# Patient Record
Sex: Female | Born: 1955 | Race: Black or African American | Hispanic: No | Marital: Married | Smoking: Former smoker
Health system: Southern US, Community
[De-identification: ages and names within clinical notes are randomized; demographics above are authoritative.]

## PROBLEM LIST (undated history)

## (undated) DIAGNOSIS — R42 Dizziness and giddiness: Secondary | ICD-10-CM

## (undated) DIAGNOSIS — Z9889 Other specified postprocedural states: Secondary | ICD-10-CM

## (undated) DIAGNOSIS — IMO0001 Reserved for inherently not codable concepts without codable children: Secondary | ICD-10-CM

## (undated) DIAGNOSIS — K5792 Diverticulitis of intestine, part unspecified, without perforation or abscess without bleeding: Secondary | ICD-10-CM

## (undated) DIAGNOSIS — K219 Gastro-esophageal reflux disease without esophagitis: Secondary | ICD-10-CM

## (undated) DIAGNOSIS — R51 Headache: Secondary | ICD-10-CM

## (undated) DIAGNOSIS — R112 Nausea with vomiting, unspecified: Secondary | ICD-10-CM

## (undated) DIAGNOSIS — E1140 Type 2 diabetes mellitus with diabetic neuropathy, unspecified: Secondary | ICD-10-CM

## (undated) DIAGNOSIS — F32A Depression, unspecified: Secondary | ICD-10-CM

## (undated) DIAGNOSIS — F319 Bipolar disorder, unspecified: Secondary | ICD-10-CM

## (undated) DIAGNOSIS — Z8709 Personal history of other diseases of the respiratory system: Secondary | ICD-10-CM

## (undated) DIAGNOSIS — R519 Headache, unspecified: Secondary | ICD-10-CM

## (undated) DIAGNOSIS — T4145XA Adverse effect of unspecified anesthetic, initial encounter: Secondary | ICD-10-CM

## (undated) DIAGNOSIS — F419 Anxiety disorder, unspecified: Secondary | ICD-10-CM

## (undated) DIAGNOSIS — T8859XA Other complications of anesthesia, initial encounter: Secondary | ICD-10-CM

## (undated) DIAGNOSIS — E876 Hypokalemia: Secondary | ICD-10-CM

## (undated) DIAGNOSIS — I10 Essential (primary) hypertension: Secondary | ICD-10-CM

## (undated) DIAGNOSIS — R079 Chest pain, unspecified: Secondary | ICD-10-CM

## (undated) DIAGNOSIS — F329 Major depressive disorder, single episode, unspecified: Secondary | ICD-10-CM

## (undated) DIAGNOSIS — E7800 Pure hypercholesterolemia, unspecified: Secondary | ICD-10-CM

## (undated) DIAGNOSIS — G4730 Sleep apnea, unspecified: Secondary | ICD-10-CM

## (undated) HISTORY — PX: ABDOMINAL HYSTERECTOMY: SHX81

## (undated) HISTORY — DX: Dizziness and giddiness: R42

## (undated) HISTORY — PX: APPENDECTOMY: SHX54

## (undated) HISTORY — PX: TONSILLECTOMY: SUR1361

## (undated) HISTORY — PX: WISDOM TOOTH EXTRACTION: SHX21

## (undated) HISTORY — PX: CHOLECYSTECTOMY: SHX55

## (undated) HISTORY — DX: Chest pain, unspecified: R07.9

---

## 1988-08-07 HISTORY — PX: APPENDECTOMY: SHX54

## 1992-08-07 HISTORY — PX: CHOLECYSTECTOMY: SHX55

## 1994-08-07 HISTORY — PX: ABDOMINAL HYSTERECTOMY: SHX81

## 2001-04-04 ENCOUNTER — Ambulatory Visit (HOSPITAL_COMMUNITY): Admission: RE | Admit: 2001-04-04 | Discharge: 2001-04-04 | Payer: Self-pay | Admitting: Orthopedic Surgery

## 2001-04-04 ENCOUNTER — Encounter: Payer: Self-pay | Admitting: Orthopedic Surgery

## 2002-01-19 ENCOUNTER — Encounter: Payer: Self-pay | Admitting: Internal Medicine

## 2002-01-19 ENCOUNTER — Emergency Department (HOSPITAL_COMMUNITY): Admission: EM | Admit: 2002-01-19 | Discharge: 2002-01-19 | Payer: Self-pay | Admitting: Internal Medicine

## 2002-01-24 ENCOUNTER — Ambulatory Visit (HOSPITAL_COMMUNITY): Admission: RE | Admit: 2002-01-24 | Discharge: 2002-01-24 | Payer: Self-pay | Admitting: Family Medicine

## 2002-01-24 ENCOUNTER — Encounter: Payer: Self-pay | Admitting: Family Medicine

## 2002-04-05 ENCOUNTER — Emergency Department (HOSPITAL_COMMUNITY): Admission: EM | Admit: 2002-04-05 | Discharge: 2002-04-05 | Payer: Self-pay | Admitting: Emergency Medicine

## 2002-06-03 ENCOUNTER — Encounter: Admission: RE | Admit: 2002-06-03 | Discharge: 2002-06-03 | Payer: Self-pay | Admitting: Otolaryngology

## 2002-06-03 ENCOUNTER — Encounter: Payer: Self-pay | Admitting: Otolaryngology

## 2002-08-07 HISTORY — PX: CARDIAC CATHETERIZATION: SHX172

## 2002-12-05 ENCOUNTER — Ambulatory Visit (HOSPITAL_COMMUNITY): Admission: RE | Admit: 2002-12-05 | Discharge: 2002-12-05 | Payer: Self-pay | Admitting: Internal Medicine

## 2003-01-16 ENCOUNTER — Ambulatory Visit (HOSPITAL_COMMUNITY): Admission: RE | Admit: 2003-01-16 | Discharge: 2003-01-17 | Payer: Self-pay | Admitting: Cardiovascular Disease

## 2003-04-23 ENCOUNTER — Ambulatory Visit (HOSPITAL_COMMUNITY): Admission: RE | Admit: 2003-04-23 | Discharge: 2003-04-23 | Payer: Self-pay | Admitting: Family Medicine

## 2003-04-23 ENCOUNTER — Encounter: Payer: Self-pay | Admitting: Family Medicine

## 2004-04-23 ENCOUNTER — Emergency Department (HOSPITAL_COMMUNITY): Admission: EM | Admit: 2004-04-23 | Discharge: 2004-04-23 | Payer: Self-pay | Admitting: Emergency Medicine

## 2004-08-07 ENCOUNTER — Emergency Department (HOSPITAL_COMMUNITY): Admission: EM | Admit: 2004-08-07 | Discharge: 2004-08-07 | Payer: Self-pay | Admitting: Emergency Medicine

## 2004-10-10 ENCOUNTER — Emergency Department (HOSPITAL_COMMUNITY): Admission: EM | Admit: 2004-10-10 | Discharge: 2004-10-10 | Payer: Self-pay | Admitting: Emergency Medicine

## 2004-10-25 ENCOUNTER — Ambulatory Visit (HOSPITAL_COMMUNITY): Admission: RE | Admit: 2004-10-25 | Discharge: 2004-10-25 | Payer: Self-pay | Admitting: Internal Medicine

## 2004-12-29 ENCOUNTER — Emergency Department (HOSPITAL_COMMUNITY): Admission: EM | Admit: 2004-12-29 | Discharge: 2004-12-29 | Payer: Self-pay | Admitting: Emergency Medicine

## 2005-04-21 ENCOUNTER — Emergency Department (HOSPITAL_COMMUNITY): Admission: EM | Admit: 2005-04-21 | Discharge: 2005-04-21 | Payer: Self-pay | Admitting: Emergency Medicine

## 2005-07-20 ENCOUNTER — Emergency Department (HOSPITAL_COMMUNITY): Admission: EM | Admit: 2005-07-20 | Discharge: 2005-07-20 | Payer: Self-pay | Admitting: *Deleted

## 2006-05-03 ENCOUNTER — Ambulatory Visit (HOSPITAL_BASED_OUTPATIENT_CLINIC_OR_DEPARTMENT_OTHER): Admission: RE | Admit: 2006-05-03 | Discharge: 2006-05-03 | Payer: Self-pay | Admitting: Otolaryngology

## 2006-05-13 ENCOUNTER — Ambulatory Visit: Payer: Self-pay | Admitting: Internal Medicine

## 2006-06-25 ENCOUNTER — Emergency Department (HOSPITAL_COMMUNITY): Admission: EM | Admit: 2006-06-25 | Discharge: 2006-06-25 | Payer: Self-pay | Admitting: Emergency Medicine

## 2006-11-16 ENCOUNTER — Emergency Department (HOSPITAL_COMMUNITY): Admission: EM | Admit: 2006-11-16 | Discharge: 2006-11-16 | Payer: Self-pay | Admitting: Emergency Medicine

## 2007-03-24 ENCOUNTER — Emergency Department (HOSPITAL_COMMUNITY): Admission: EM | Admit: 2007-03-24 | Discharge: 2007-03-24 | Payer: Self-pay | Admitting: Emergency Medicine

## 2007-04-24 ENCOUNTER — Emergency Department (HOSPITAL_COMMUNITY): Admission: EM | Admit: 2007-04-24 | Discharge: 2007-04-24 | Payer: Self-pay | Admitting: Emergency Medicine

## 2007-05-13 ENCOUNTER — Emergency Department (HOSPITAL_COMMUNITY): Admission: EM | Admit: 2007-05-13 | Discharge: 2007-05-14 | Payer: Self-pay | Admitting: Emergency Medicine

## 2007-08-08 DIAGNOSIS — R079 Chest pain, unspecified: Secondary | ICD-10-CM

## 2007-08-08 HISTORY — DX: Chest pain, unspecified: R07.9

## 2007-08-08 HISTORY — PX: CARDIAC CATHETERIZATION: SHX172

## 2007-11-04 ENCOUNTER — Emergency Department (HOSPITAL_COMMUNITY): Admission: EM | Admit: 2007-11-04 | Discharge: 2007-11-04 | Payer: Self-pay | Admitting: Emergency Medicine

## 2007-11-04 ENCOUNTER — Inpatient Hospital Stay (HOSPITAL_COMMUNITY): Admission: RE | Admit: 2007-11-04 | Discharge: 2007-11-08 | Payer: Self-pay | Admitting: *Deleted

## 2007-11-04 ENCOUNTER — Ambulatory Visit: Payer: Self-pay | Admitting: *Deleted

## 2008-03-22 ENCOUNTER — Inpatient Hospital Stay (HOSPITAL_COMMUNITY): Admission: EM | Admit: 2008-03-22 | Discharge: 2008-03-25 | Payer: Self-pay | Admitting: Emergency Medicine

## 2008-03-23 ENCOUNTER — Encounter (INDEPENDENT_AMBULATORY_CARE_PROVIDER_SITE_OTHER): Payer: Self-pay | Admitting: Internal Medicine

## 2008-04-22 ENCOUNTER — Ambulatory Visit: Payer: Self-pay | Admitting: Internal Medicine

## 2008-07-10 ENCOUNTER — Emergency Department (HOSPITAL_COMMUNITY): Admission: EM | Admit: 2008-07-10 | Discharge: 2008-07-10 | Payer: Self-pay | Admitting: Emergency Medicine

## 2009-03-13 ENCOUNTER — Emergency Department (HOSPITAL_COMMUNITY): Admission: EM | Admit: 2009-03-13 | Discharge: 2009-03-13 | Payer: Self-pay | Admitting: Emergency Medicine

## 2009-06-03 ENCOUNTER — Emergency Department (HOSPITAL_COMMUNITY): Admission: EM | Admit: 2009-06-03 | Discharge: 2009-06-03 | Payer: Self-pay | Admitting: Emergency Medicine

## 2009-09-16 ENCOUNTER — Ambulatory Visit: Payer: Self-pay | Admitting: Cardiology

## 2009-09-16 ENCOUNTER — Inpatient Hospital Stay (HOSPITAL_COMMUNITY): Admission: EM | Admit: 2009-09-16 | Discharge: 2009-09-17 | Payer: Self-pay | Admitting: Emergency Medicine

## 2009-09-17 ENCOUNTER — Encounter (INDEPENDENT_AMBULATORY_CARE_PROVIDER_SITE_OTHER): Payer: Self-pay | Admitting: Internal Medicine

## 2010-05-13 ENCOUNTER — Emergency Department (HOSPITAL_COMMUNITY): Admission: EM | Admit: 2010-05-13 | Discharge: 2010-05-13 | Payer: Self-pay | Admitting: Emergency Medicine

## 2010-05-23 ENCOUNTER — Ambulatory Visit: Payer: Self-pay | Admitting: Diagnostic Radiology

## 2010-05-23 ENCOUNTER — Ambulatory Visit (HOSPITAL_BASED_OUTPATIENT_CLINIC_OR_DEPARTMENT_OTHER)
Admission: RE | Admit: 2010-05-23 | Discharge: 2010-05-23 | Payer: Self-pay | Source: Home / Self Care | Admitting: Internal Medicine

## 2010-05-30 ENCOUNTER — Ambulatory Visit: Payer: Self-pay | Admitting: Radiology

## 2010-05-30 ENCOUNTER — Ambulatory Visit (HOSPITAL_BASED_OUTPATIENT_CLINIC_OR_DEPARTMENT_OTHER): Admission: RE | Admit: 2010-05-30 | Discharge: 2010-05-30 | Payer: Self-pay | Admitting: Internal Medicine

## 2010-07-27 ENCOUNTER — Emergency Department (HOSPITAL_COMMUNITY)
Admission: EM | Admit: 2010-07-27 | Discharge: 2010-07-27 | Payer: Self-pay | Source: Home / Self Care | Admitting: Emergency Medicine

## 2010-08-28 ENCOUNTER — Encounter: Payer: Self-pay | Admitting: Family Medicine

## 2010-09-06 NOTE — Consult Note (Signed)
Summary: [facility]   Imported By: Roderic Ovens 10/04/2009 16:07:41  _____________________________________________________________________  External Attachment:    Type:   Image     Comment:   External Document

## 2010-10-17 LAB — DIFFERENTIAL
Basophils Absolute: 0 10*3/uL (ref 0.0–0.1)
Basophils Relative: 1 % (ref 0–1)
Eosinophils Absolute: 0.2 10*3/uL (ref 0.0–0.7)
Eosinophils Relative: 4 % (ref 0–5)
Lymphocytes Relative: 40 % (ref 12–46)
Lymphs Abs: 1.9 10*3/uL (ref 0.7–4.0)
Monocytes Absolute: 0.3 10*3/uL (ref 0.1–1.0)
Monocytes Relative: 7 % (ref 3–12)
Neutro Abs: 2.3 10*3/uL (ref 1.7–7.7)
Neutrophils Relative %: 48 % (ref 43–77)

## 2010-10-17 LAB — COMPREHENSIVE METABOLIC PANEL
ALT: 24 U/L (ref 0–35)
AST: 25 U/L (ref 0–37)
Albumin: 3.7 g/dL (ref 3.5–5.2)
Alkaline Phosphatase: 86 U/L (ref 39–117)
BUN: 7 mg/dL (ref 6–23)
CO2: 27 mEq/L (ref 19–32)
Calcium: 9.1 mg/dL (ref 8.4–10.5)
Chloride: 105 mEq/L (ref 96–112)
Creatinine, Ser: 0.8 mg/dL (ref 0.4–1.2)
GFR calc Af Amer: >60 mL/min (ref >60)
GFR calc non Af Amer: >60 mL/min (ref >60)
Glucose, Bld: 110 mg/dL — ABNORMAL HIGH (ref 70–99)
Potassium: 3.9 mEq/L (ref 3.5–5.1)
Sodium: 142 mEq/L (ref 135–145)
Total Bilirubin: 0.5 mg/dL (ref 0.3–1.2)
Total Protein: 7.2 g/dL (ref 6.0–8.3)

## 2010-10-17 LAB — CBC
HCT: 41.7 % (ref 36.0–46.0)
Hemoglobin: 14 g/dL (ref 12.0–15.0)
MCH: 28.7 pg (ref 26.0–34.0)
MCHC: 33.6 g/dL (ref 30.0–36.0)
MCV: 85.5 fL (ref 78.0–100.0)
Platelets: 198 10*3/uL (ref 150–400)
RBC: 4.88 MIL/uL (ref 3.87–5.11)
RDW: 14.1 % (ref 11.5–15.5)
WBC: 4.8 10*3/uL (ref 4.0–10.5)

## 2010-10-17 LAB — POCT CARDIAC MARKERS
CKMB, poc: <1 ng/mL — ABNORMAL LOW (ref 1.0–8.0)
Myoglobin, poc: 38.9 ng/mL (ref 12–200)
Troponin i, poc: <0.05 ng/mL (ref 0.00–0.09)

## 2010-10-17 LAB — D-DIMER, QUANTITATIVE: D-Dimer, Quant: 0.45 ug/mL-FEU (ref 0.00–0.48)

## 2010-10-26 LAB — CBC
HCT: 37.9 % (ref 36.0–46.0)
HCT: 38.7 % (ref 36.0–46.0)
Hemoglobin: 13.1 g/dL (ref 12.0–15.0)
Hemoglobin: 13.4 g/dL (ref 12.0–15.0)
MCHC: 34.7 g/dL (ref 30.0–36.0)
MCHC: 34.8 g/dL (ref 30.0–36.0)
MCV: 85.6 fL (ref 78.0–100.0)
MCV: 86.2 fL (ref 78.0–100.0)
Platelets: 160 10*3/uL (ref 150–400)
Platelets: 191 10*3/uL (ref 150–400)
RBC: 4.4 MIL/uL (ref 3.87–5.11)
RBC: 4.52 MIL/uL (ref 3.87–5.11)
RDW: 13.2 % (ref 11.5–15.5)
RDW: 13.6 % (ref 11.5–15.5)
WBC: 5.4 10*3/uL (ref 4.0–10.5)
WBC: 6 10*3/uL (ref 4.0–10.5)

## 2010-10-26 LAB — DIFFERENTIAL
Basophils Absolute: 0 10*3/uL (ref 0.0–0.1)
Basophils Relative: 1 % (ref 0–1)
Eosinophils Absolute: 0.2 10*3/uL (ref 0.0–0.7)
Eosinophils Relative: 3 % (ref 0–5)
Lymphocytes Relative: 38 % (ref 12–46)
Lymphs Abs: 2.2 10*3/uL (ref 0.7–4.0)
Monocytes Absolute: 0.4 10*3/uL (ref 0.1–1.0)
Monocytes Relative: 7 % (ref 3–12)
Neutro Abs: 3.1 10*3/uL (ref 1.7–7.7)
Neutrophils Relative %: 52 % (ref 43–77)

## 2010-10-26 LAB — CARDIAC PANEL(CRET KIN+CKTOT+MB+TROPI)
CK, MB: 0.8 ng/mL (ref 0.3–4.0)
CK, MB: 0.9 ng/mL (ref 0.3–4.0)
CK, MB: 1 ng/mL (ref 0.3–4.0)
Relative Index: INVALID (ref 0.0–2.5)
Relative Index: INVALID (ref 0.0–2.5)
Relative Index: INVALID (ref 0.0–2.5)
Total CK: 81 U/L (ref 7–177)
Total CK: 83 U/L (ref 7–177)
Total CK: 88 U/L (ref 7–177)
Troponin I: 0.04 ng/mL (ref 0.00–0.06)
Troponin I: <0.01 ng/mL (ref 0.00–0.06)
Troponin I: <0.01 ng/mL (ref 0.00–0.06)

## 2010-10-26 LAB — COMPREHENSIVE METABOLIC PANEL
ALT: 34 U/L (ref 0–35)
ALT: 36 U/L — ABNORMAL HIGH (ref 0–35)
AST: 31 U/L (ref 0–37)
AST: 32 U/L (ref 0–37)
Albumin: 3.4 g/dL — ABNORMAL LOW (ref 3.5–5.2)
Albumin: 3.6 g/dL (ref 3.5–5.2)
Alkaline Phosphatase: 93 U/L (ref 39–117)
Alkaline Phosphatase: 96 U/L (ref 39–117)
BUN: 10 mg/dL (ref 6–23)
BUN: 11 mg/dL (ref 6–23)
CO2: 28 mEq/L (ref 19–32)
CO2: 31 mEq/L (ref 19–32)
Calcium: 8.5 mg/dL (ref 8.4–10.5)
Calcium: 9.2 mg/dL (ref 8.4–10.5)
Chloride: 100 mEq/L (ref 96–112)
Chloride: 104 mEq/L (ref 96–112)
Creatinine, Ser: 0.61 mg/dL (ref 0.4–1.2)
Creatinine, Ser: 0.74 mg/dL (ref 0.4–1.2)
GFR calc Af Amer: >60 mL/min (ref >60)
GFR calc Af Amer: >60 mL/min (ref >60)
GFR calc non Af Amer: >60 mL/min (ref >60)
GFR calc non Af Amer: >60 mL/min (ref >60)
Glucose, Bld: 102 mg/dL — ABNORMAL HIGH (ref 70–99)
Glucose, Bld: 166 mg/dL — ABNORMAL HIGH (ref 70–99)
Potassium: 3.3 mEq/L — ABNORMAL LOW (ref 3.5–5.1)
Potassium: 3.6 mEq/L (ref 3.5–5.1)
Sodium: 138 mEq/L (ref 135–145)
Sodium: 139 mEq/L (ref 135–145)
Total Bilirubin: 0.2 mg/dL — ABNORMAL LOW (ref 0.3–1.2)
Total Bilirubin: 0.5 mg/dL (ref 0.3–1.2)
Total Protein: 6.8 g/dL (ref 6.0–8.3)
Total Protein: 6.8 g/dL (ref 6.0–8.3)

## 2010-10-26 LAB — GLUCOSE, CAPILLARY
Glucose-Capillary: 109 mg/dL — ABNORMAL HIGH (ref 70–99)
Glucose-Capillary: 110 mg/dL — ABNORMAL HIGH (ref 70–99)
Glucose-Capillary: 114 mg/dL — ABNORMAL HIGH (ref 70–99)
Glucose-Capillary: 117 mg/dL — ABNORMAL HIGH (ref 70–99)
Glucose-Capillary: 117 mg/dL — ABNORMAL HIGH (ref 70–99)
Glucose-Capillary: 129 mg/dL — ABNORMAL HIGH (ref 70–99)

## 2010-10-26 LAB — URINALYSIS, ROUTINE W REFLEX MICROSCOPIC
Bilirubin Urine: NEGATIVE
Glucose, UA: NEGATIVE mg/dL
Hgb urine dipstick: NEGATIVE
Ketones, ur: NEGATIVE mg/dL
Leukocytes, UA: NEGATIVE
Nitrite: POSITIVE — AB
Protein, ur: NEGATIVE mg/dL
Specific Gravity, Urine: 1.028 (ref 1.005–1.030)
Urobilinogen, UA: 1 mg/dL (ref 0.0–1.0)
pH: 6 (ref 5.0–8.0)

## 2010-10-26 LAB — URINE CULTURE: Colony Count: 75000

## 2010-10-26 LAB — HEMOGLOBIN A1C
Hgb A1c MFr Bld: 6.5 % — ABNORMAL HIGH (ref 4.6–6.1)
Mean Plasma Glucose: 140 mg/dL

## 2010-10-26 LAB — POCT CARDIAC MARKERS
CKMB, poc: <1 ng/mL — ABNORMAL LOW (ref 1.0–8.0)
CKMB, poc: <1 ng/mL — ABNORMAL LOW (ref 1.0–8.0)
Myoglobin, poc: 35.4 ng/mL (ref 12–200)
Myoglobin, poc: 40.6 ng/mL (ref 12–200)
Troponin i, poc: 0.09 ng/mL (ref 0.00–0.09)
Troponin i, poc: <0.05 ng/mL (ref 0.00–0.09)

## 2010-10-26 LAB — RAPID URINE DRUG SCREEN, HOSP PERFORMED
Amphetamines: NOT DETECTED
Barbiturates: NOT DETECTED
Benzodiazepines: NOT DETECTED
Cocaine: NOT DETECTED
Opiates: POSITIVE — AB
Tetrahydrocannabinol: NOT DETECTED

## 2010-10-26 LAB — MRSA PCR SCREENING: MRSA by PCR: NEGATIVE

## 2010-10-26 LAB — TSH: TSH: 2.38 u[IU]/mL (ref 0.350–4.500)

## 2010-10-26 LAB — URINE MICROSCOPIC-ADD ON

## 2010-11-10 LAB — CBC
HCT: 38.4 % (ref 36.0–46.0)
Hemoglobin: 13.1 g/dL (ref 12.0–15.0)
MCHC: 34 g/dL (ref 30.0–36.0)
MCV: 86.3 fL (ref 78.0–100.0)
Platelets: 173 10*3/uL (ref 150–400)
RBC: 4.45 MIL/uL (ref 3.87–5.11)
RDW: 13.9 % (ref 11.5–15.5)
WBC: 5.8 10*3/uL (ref 4.0–10.5)

## 2010-11-10 LAB — DIFFERENTIAL
Basophils Absolute: 0 10*3/uL (ref 0.0–0.1)
Basophils Relative: 1 % (ref 0–1)
Eosinophils Absolute: 0.1 10*3/uL (ref 0.0–0.7)
Eosinophils Relative: 1 % (ref 0–5)
Lymphocytes Relative: 35 % (ref 12–46)
Lymphs Abs: 2 10*3/uL (ref 0.7–4.0)
Monocytes Absolute: 0.6 10*3/uL (ref 0.1–1.0)
Monocytes Relative: 11 % (ref 3–12)
Neutro Abs: 3 10*3/uL (ref 1.7–7.7)
Neutrophils Relative %: 52 % (ref 43–77)

## 2010-11-10 LAB — BASIC METABOLIC PANEL
BUN: 10 mg/dL (ref 6–23)
CO2: 31 mEq/L (ref 19–32)
Calcium: 9.6 mg/dL (ref 8.4–10.5)
Chloride: 102 mEq/L (ref 96–112)
Creatinine, Ser: 0.65 mg/dL (ref 0.4–1.2)
GFR calc Af Amer: >60 mL/min (ref >60)
GFR calc non Af Amer: >60 mL/min (ref >60)
Glucose, Bld: 101 mg/dL — ABNORMAL HIGH (ref 70–99)
Potassium: 3.4 mEq/L — ABNORMAL LOW (ref 3.5–5.1)
Sodium: 139 mEq/L (ref 135–145)

## 2010-11-10 LAB — D-DIMER, QUANTITATIVE

## 2010-12-07 ENCOUNTER — Other Ambulatory Visit: Payer: Self-pay | Admitting: Sports Medicine

## 2010-12-07 DIAGNOSIS — M79641 Pain in right hand: Secondary | ICD-10-CM

## 2010-12-20 NOTE — Discharge Summary (Signed)
NAMEGUDRUN, Bonnie Gordon                 ACCOUNT NO.:  [id number]   MEDICAL RECORD NO.:  [id number]          PATIENT TYPE:  INP   LOCATION:  1443                         FACILITY:  [facility]   PHYSICIAN:  Hind I Elsaid, MD      DATE OF BIRTH:  01-21-1956   DATE OF ADMISSION:  03/22/2008  DATE OF DISCHARGE:  03/25/2008                               DISCHARGE SUMMARY   The patient will follow up with [facility].   DISCHARGE DIAGNOSES:  1. Atypical chest pain.  2. Non-critical coronary artery disease.  3. Presyncope felt secondary to orthostasis from antihypertensive      medication, resolved.  4. Hyperlipidemia.  5. Hypertension.  6. History of depression.   SECONDARY DIAGNOSES:  1. History of gastroesophageal reflux disease.  2. History of irritable bowel syndrome.  3. History of mild obstructive sleep apnea.   DISCHARGE MEDICATIONS:  1. Abilify 5 mg daily.  2. Trazodone 100 mg daily.  3. Metoprolol 25 mg twice daily.  4. prevacid 30 mg daily.  5.crestor 20 mg po QHS.   PROCEDURES:  1. Cardiac catheterization which showed left main normal LAD, 30%      proximal stenosis.  Circumflex non-dominant, normal.  Ramus      moderate in size and normal.  Right coronary artery with 30%      stenosis.  Ejection fraction 60% without focal wall motion      abnormalities.  2. CT angio negative for pulmonary emboli.  3. Chest x-ray with no evidence of acute cardiopulmonary disease.   HISTORY OF PRESENT ILLNESS:  This is a 55 year old female admitted to  the hospital with chest pain on and off, usually while standing, but  there was no loss of consciousness, and she measured her blood pressure,  and it was found to be 84 at home.  Her daughter drove her to the  emergency room.   PROBLEM:  1. For chest pain, cardiac enzymes and EKG within normal limits.  MI      was ruled out.  Cardiology consulted and they recommended cardiac      catheterization.  Cardiac catheterization results as above.   Statin      was added to the patient's medications.  Hemoglobin c1 was normal.      TSH was within normal limits.  The patient was diagnosed with non-      obstructive coronary artery disease for medical management to      control the blood pressure and hyperlipidemia.  Coronary artery      disease was ruled out, and the patient was advised to follow with      [facility] for a possible GI cause of her chest pain.  The patient      was prescribed Ranitidine for 2 months, and if continues to have      chest pain, may need to have an EGD.  2. Hypertension.  For that, hydrochlorothiazide was held, and the      patient was stated on a small dose beta blocker, and the patient      informed about if  has any fainting , she has to hold her blood      pressure medication.  3. Hyperlipidemia.  Lovastatin was started.   No complication after the cardiac catheterization, and the patient is  medically stable to be discharged home.  Follow with [facility] within  1-2 weeks.      Hind Bosie Helper, MD  Electronically Signed     HIE/MEDQ  D:  03/25/2008  T:  03/25/2008  Job:  [id number]

## 2010-12-20 NOTE — H&P (Signed)
Bonnie Gordon, Bonnie Gordon                 ACCOUNT NO.:  [id number]   MEDICAL RECORD NO.:  [id number]          PATIENT TYPE:  IPS   LOCATION:  0303                          [facility]   PHYSICIAN:  Jasmine Pang, M.D. DATE OF BIRTH:  07-26-1956   DATE OF ADMISSION:  11/04/2007  DATE OF DISCHARGE:                       PSYCHIATRIC ADMISSION ASSESSMENT   This is a 55 year old female voluntarily admitted on November 04, 2007.   HISTORY OF PRESENT ILLNESS:  The patient presents with a history of  depression.  She states that she has been experiencing this on and off  for a while.  Having episodes of crying.  She feels like she is always  on the back burner.  She is having thoughts to run off a bridge or  wreck her car.  She reports also that she is having periods of drinking  too much.  She has had a decrease in appetite and she has lost 20  pounds.  She was not trying to lose weight.  This happened over a 6  month period.  She states that her children are her biggest stress and  that they do not take her symptoms seriously   PAST PSYCHIATRIC HISTORY:  This is the first admission to the [facility].  No current outpatient treatment.   SOCIAL HISTORY:  This is a 55 year old female.  She lives alone.  She  has been divorced since 2005.  She states her ex-husband is supportive.  She has two adult daughters.  She has a friend who is also supportive.  She is attempting to get disability.  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   FAMILY HISTORY:  None that she is aware of.  __________   She again reports excessive drinking at times.  Denies any drug use.  Is  a nonsmoker.   PRIMARY CARE Angel Hobdy:  Dr. Yetta Barre that [facility].   MEDICAL PROBLEMS:  1. Hypertension.  2. Migraine headaches.  3. Chronic bronchitis.   MEDICATIONS:  1. Has been on Prozac in the past.  Took herself off in August, as she      was stating that people were telling her that she  would become      dependent on the medicine.  2. Also takes Flexeril t.i.d. p.r.n.   DRUG ALLERGIES:  ASPIRIN.  Reports problems with bruising.   PHYSICAL EXAMINATION:  The patient was fully assessed at [facility]  emergency department which was reviewed.  There were no significant  findings.  The patient did receive Ativan.  VITAL SIGNS:  Temperature is 97, 73 heart rate, 24 respirations, blood  pressure is 137/92, 5 feet 6 inches tall, 156 pounds.   LABORATORY DATA:  CBC within normal limits.  Potassium 3.6.  Urinalysis  is negative.  Urine drug screen is negative.   The patient denies any complaints.  Again, she appears in no distress.  Mental status - at this time, she is fully alert, cooperative,  appropriately dressed, fair eye contact.  Speech is soft-spoken, normal  pace.  The patient's mood is  hopeless.  The patient's affect is sad.  Thought processes are coherent.  No evidence of any delusional thinking.  Cognitive function intact.  Her memory is good.  Judgment and insight  appears to be good.  She appears sincere.   IMPRESSION:  AXIS I:  Depressive disorder NOS.  AXIS II:  Deferred.  AXIS III:  Hypertension, migraines and chronic bronchitis.  AXIS IV:  Problems with her children and other psychosocial problems.  AXIS V:  Current is 35-40.   Plans to contract for safety.  Will stabilize her mood and thinking.  Will monitor patient's blood pressure.  Resume her blood pressure  medications.  We will put the patient back on Prozac.  The patient was  agreeable to beginning her medication again.  Will also consider having  a family session with her children.  The patient may benefit from a  therapist.  We also need to address the patient's alcohol use.  Her  tentative length of stay is 3-5 days.      Landry Corporal, N.P.      Jasmine Pang, M.D.  Electronically Signed    JO/MEDQ  D:  11/06/2007  T:  11/06/2007  Job:  [id number]

## 2010-12-20 NOTE — H&P (Signed)
Bonnie Gordon, Bonnie Gordon                 ACCOUNT NO.:  [id number]   MEDICAL RECORD NO.:  [id number]          PATIENT TYPE:  INP   LOCATION:  0105                         FACILITY:  [facility]   PHYSICIAN:  Isidor Holts, M.D.  DATE OF BIRTH:  Jan 29, 1956   DATE OF ADMISSION:  03/22/2008  DATE OF DISCHARGE:                              HISTORY & PHYSICAL   PRIMARY CARE PHYSICIAN:  Unassigned. The patient used to see Dr. Assunta Found in [place], [place].  However, she subsequently lost  her insurance and is currently being cared for by Dr. Yetta Barre at [facility], from time to time.   CHIEF COMPLAINT:  Recurrent chest pain for approximately 1 week,  faintness for approximately 3 days.   HISTORY OF PRESENT ILLNESS:  This is a 55 year old female.  She is a  very good historian.  According to her, over the past 1 week, she has  experienced chest pain on and off at rest, and on exertion.  She  describes this chest pain as central, radiating to her right shoulder  and breast, lasting approximately 5 minutes, associated with shortness  of breath, diaphoresis and nausea.  Usually she feels as though she has  been kicked in the chest.  In addition, for the past 3 days the  patient has experienced faintness, usually while standing; however, no  loss of consciousness.  On March 22, 2008, at about 11 a.m., while  cooking in her kitchen, she felt faint, took her blood pressure and  found her SBP to about 84 mmhg.  She called her daughter, and drove  subsequently to the emergency department.  On detailed questioning. the  patient states that she had traveled en to [place] on July 12, a 5-hour  trip each way.  She denies any leg swelling since then, but has  experienced nocturnal leg cramps.   PAST MEDICAL HISTORY:  1. GERD.  2. Query irritable bowel syndrome.  3. Mild obstructive sleep apnea syndrome per polysomnogram May 03, 2006, not requiring CPAP.  4.  Diverticulosis/external hemorrhoids on colonoscopy April 2004.  5. Hypertension.  6. Status post cardiac catheterization January 16, 2003 by Dr. Nicki Guadalajara, for chest pain.  This showed normal LV function, ejection      fraction 58%, 10% to 20% proximal LAD.  7. Depression, status post hospitalization at [facility]      from November 04, 2007 to November 08, 2007  for suicidal ideation.  8. Status post cholecystectomy in 2000.  9. Status post appendectomy.  10.Status post hysterectomy for uterine fibroids 1994.  11.Migraine headaches.   MEDICATIONS:  1. Abilify 5 mg p.o. daily.  2. Hydrochlorothiazide 25 mg p.o. daily.  3. Trazodone 100 mg p.o. at bedtime.   ALLERGIES:  ASPIRIN causes bruising.   REVIEW OF SYSTEMS:  As per HPI and chief complaint; otherwise negative.  The patient denies abdominal pain, vomiting or diarrhea.  Denies cough.  Denies any sick contacts.   SOCIAL HISTORY:  The patient is divorced approximately 4 years  now.  Is  unemployed.  Ex-smoker.  Quit approximately 23 years ago.  Has two  offspring alive and well.  Drinks alcohol only socially.  Has no history  of drug abuse.   FAMILY HISTORY:  The patient's father is deceased, status post head  injury, secondary to a fall at age 45 years.  Her mother is still living  at 48 years of age, with diabetes mellitus and hypertension.  The  patient has three sisters, one of whom has Crohn's disease and is status  post surgery for brain tumor.  Another sister has heart disease and a  third sister has diabetes mellitus.  The patient has two brothers.  One  has heart problems and is status post cardiac pacer.  The other  brother's health is unknown.   PHYSICAL EXAMINATION:  VITAL SIGNS:  Temperature 98.5, pulse 87 per  minute and regular, respiratory rate 20, BP 117/81 mmHg, pulse oximeter  96% on room air.  GENERAL:  The patient did not appear to be in obvious acute discomfort  at time of this evaluation.   Alert, communicative, not short of breath  at rest.  HEENT:  No clinical pallor or jaundice.  No conjunctival injection.  Throat is clear.  NECK:  Supple.  JVP not seen.  No palpable lymphadenopathy.  No palpable  goiter.  CHEST:  Clinically clear to auscultation.  No wheezes or crackles.  HEART:  Heart sounds 1 and 2 are heard, normal regular, no murmurs.  ABDOMEN:  Abdomen is full, soft and nontender.  No palpable organomegaly  or palpable masses.  Normal bowel sounds.  LOWER EXTREMITIES:  No pitting edema.  Palpable peripheral pulses.  MUSCULOSKELETAL:  Unremarkable.  CENTRAL NERVOUS SYSTEM:  No focal neurologic deficits on gross  examination.   LABORATORY DATA:  CBC:  WBC 4.8, hemoglobin 14.5, hematocrit 42.8,  platelets 201,000.  Electrolytes:  Sodium 139, potassium 3.2, chloride  99.  CO2 34, BUN 14, creatinine 1.2, glucose 119.  CBG 136.  Troponin I  point of care less than 0.05.  Chest x-ray dated March 22, 2008 shows  no acute cardiopulmonary disease.  A 12-lead EKG dated March 22, 2008  shows sinus rhythm, occasional PVCs, rate 91 per minute, normal axis,  old Q-wave in V1.  No acute ischemic changes.   ASSESSMENT AND PLAN:  1. Chest pain.  Rule out coronary artery disease.  The patient's chest      pain has some atypical features, in addition to exertional      presyncope.  She does have a history of recent long distance travel      and lower extremity nocturnal cramps.  we therefore, need to rule      out possible pulmonary embolism.  We shall admit patient to      telemetric monitoring, cycle cardiac enzymes, do lipid profile,      TSH, 2-D echocardiogram.  Do chest CT angiogram to rule out      pulmonary embolism.  Meanwhile, commence Plavix, as the patient is      intolerant of Aspirin.  If above workup proves negative, the      patient will likely need cardiology consultation for      stratification.  She has in the past, been seen by Dr. Nicki Guadalajara.   1.  Presyncope.  Antihypertensive may be contributory to this.  We      shall, therefore, hold Hydrochlorothiazide for now, and monitor.  1. History of depression.  The patient's mood appears stable.  We      shall continue pre-admission Abilify and Trazodone.   1. History of gastroesophageal reflux disease.  We shall manage with      proton pump inhibitor.   Further management will depend on clinical course.      Isidor Holts, M.D.  Electronically Signed     CO/MEDQ  D:  03/22/2008  T:  03/22/2008  Job:  [id number]

## 2010-12-20 NOTE — Cardiovascular Report (Signed)
[name], STUCKERT NO.:  [id number]   MEDICAL RECORD NO.:  [id number]          PATIENT TYPE:  OUT   LOCATION:  CATH                         FACILITY:  [facility]   PHYSICIAN:  Nanetta Batty, M.D.   DATE OF BIRTH:  1956-06-07   DATE OF PROCEDURE:  DATE OF DISCHARGE:                            CARDIAC CATHETERIZATION   [name] is a 55 year old African American female admitted 2 days ago  with a chest pain.  She has positive risk factors including family  history, hypertension, hyperlipidemia, and borderline diabetes.  She  was cathed on January 16, 2003, by Dr. Tresa Endo revealing 20% proximal LAD  lesion.  Her enzymes were negative, and she presents now for diagnostic  coronary arteriography to define anatomy and rule out ischemic etiology.   PROCEDURE DESCRIPTION:  The patient was brought to the second floor at  [facility] in a postabsorptive state.  She was  premedicated with p.o. Valium.  Her right groin was prepped and shaved  in the usual sterile fashion.  Xylocaine 1% was used for local  anesthesia.  A 6-French sheath was inserted into the right femoral  artery using standard Seldinger technique.  A 6-French right-left  Judkins diagnostic catheter along with a 6-French pigtail catheter were  used for selective coronary angiography and left ventriculography  respectively.  Visipaque dye was used for the entirety of the case.  Retrograde aortic, left ventricular, and pullback pressures were  recorded.   HEMODYNAMIC RESULTS:  1. Aortic systolic pressure 152, end-diastolic pressure 88.  2. Left ventricular systolic pressure 149, end-diastolic pressure 15.   SELECTIVE CORONARY ANGIOGRAPHY:  1. Left main normal.  2. LAD; LAD had approximately 30% smooth proximal stenosis.  3. Circumflex; nondominant normal.  4. Ramus branch; moderate in size and normal.  5. Right coronary artery; dominant with a 30% stenosis in the      midportion after acute  marginal branch.  6. Left ventriculography; RAO left ventriculogram was performed using      25 mL of Visipaque dye at 12 mL per second.  The overall LVEF was      estimated to be greater than 60% without focal wall motion      abnormalities.   IMPRESSION:  [name] has noncritical coronary artery disease with  normal left ventricular function.  I believe her chest pain is  noncardiac.  Medical therapy will be recommended.  She may need a  gastrointestinal evaluation.   Sheath was removed and pressure was held in the groin to achieve  hemostasis.  The patient left the lab in stable condition.  She will be  seen back as needed.      Nanetta Batty, M.D.  Electronically Signed     JB/MEDQ  D:  03/24/2008  T:  03/25/2008  Job:  [id number]   cc:   [facility] Second Floor  [facility]  Nicki Guadalajara, M.D.

## 2010-12-23 NOTE — Consult Note (Signed)
[name], Gordon NO.:  [id number]   MEDICAL RECORD NO.:  [id number]                  PATIENT TYPE:   LOCATION:                                       FACILITY:   PHYSICIAN:  Bonnie Gordon, M.D.                 DATE OF BIRTH:  Oct 02, 1955   DATE OF CONSULTATION:  11/25/2002  DATE OF DISCHARGE:                                   CONSULTATION   REQUESTING PHYSICIAN:  Bonnie Gordon, M.D.   REASON FOR CONSULTATION:  Abdominal pain, bloating, and gas.   CHIEF COMPLAINT:  Bonnie Gordon is a pleasant 55 year old black female patient of  Dr. Regino Gordon who presents today for further evaluation of the above stated  symptoms.  The patient states that she has had intermittent right mid to  right upper quadrant abdominal spasms chronically.  They seem to be getting  worse.  Associated was nausea, but no vomiting. She also has had abdominal  bloating and excess gas. Symptoms have been going on, quite persistently for  a couple of weeks.  She says that she wakes up at night, when she is off  work, around 3 a.m. with these symptoms.  She has had to go to the emergency  department a couple of times and has gotten a shot.  She works third shift  and may also notice some of the symptoms while she is at work. She does have  some heartburn symptoms especially with certain foods.  Since starting  Acephen a couple of weeks ago this has helped.  She has tried to change her  diet.  She decreased caffeine consumption and soft drinks in the last 4  months and noted only slight improvement of her symptoms.  She denies any  dysphagia, odynophagia.  Her bowels are moving twice a day.  She has loose  stools at least a couple of days weekly. She also has fecal urgency.   She has had chronic intermittent hematochezia, has never undergone  colonoscopy.  The last time she saw and bright red blood was last month. She  denies any melena.  She recently went to the emergency department  with chest  pain and shortness of breath.  She states that she was told that she may  have a pulled muscle.  She was started on Relafen 500 mg b.i.d. She was  asked to decrease her exercise to see if this would help.  She has noted on  further chest pain or shortness of breath.  She did have a stress test  although results are pending. She also, prior to Relafen had been on  Naprosyn for pain.  She is concerned because her sister has Crohn's disease.   CURRENT MEDICATIONS:  1. Relafen 500 mg b.i.d.  2. Aciphex 20 mg daily.   ALLERGIES:  Aspirin.   PAST MEDICAL AND SURGICAL HISTORY:  She was seen previously by Dr. _______  in 1999  and felt to have an IBS.  She also has typical GERD symptoms. She is  status post cholecystectomy in 2000.  She has had an appendectomy,  hysterectomy in 1994 for fibroids.   FAMILY HISTORY:  Father died of a head injury resulting from a fall at age  50.  She has a sister with a history of Crohn's disease.  Another sister  with questionable stomach cancer.   SOCIAL HISTORY:  She is _______.  She does not smoke cigarettes.  She drinks  alcohol occasionally.   REVIEW OF SYSTEMS:  Please see HPI for GI.  GENERAL:  Denies any  unintentional weight loss.  CARDIOPULMONARY:  Please see HPI.   PHYSICAL EXAMINATION:  VITAL SIGNS:  Weight 160, blood pressure 120/78,  pulse 76.  GENERAL:  A pleasant, well-nourished, well-developed, black female in no  acute distress.  SKIN:  Warm and dry.  No jaundice.  HEENT:  Conjunctivae are pink.  Sclerae anicteric.  Oropharyngeal mucosa  moist and pink.  No lesions, erythema or exudate.  NECK:  No lymphadenopathy or thyromegaly.  CHEST:  Lungs are clear to auscultation.  CARDIOVASCULAR:  Cardiac exam reveals regular rate and rhythm.  Normal S1,  S2.  No murmurs, rubs, or gallops.  ABDOMEN:  Positive bowel sounds, full, but symmetrical, soft.  She has mild  tenderness in the right mid and right upper quadrant to deep  palpation. No  organomegaly or masses.  EXTREMITIES:  No edema.   IMPRESSION:  Bonnie Gordon is a pleasant, 56 year old, black female with multiple  gastrointestinal complaints. She has had a couple month history of  progressive, right sided abdominal pain, associated with abdominal bloating,  gas, and nausea. She also has typical reflux symptoms.  I suspect that she  does have gastroesophageal reflux disease.  Other GI symptoms might be due  to irritable bowel syndrome.  She is having some loose stool, again, which  may be due to irritable bowel syndrome, though we cannot rule out Crohn's  disease.  She has had chronic intermittent hematochezia from a benign  anorectal source, but again needs to be further evaluated.   I have discussed with her today, esophagogastroduodenoscopy and colonoscopy  with possible ileoscopy.  We need to assess her GI tract ostensibly given  her numerous symptoms.  In addition, she has been on NSAID for a while and  we need to rule out peptic ulcer disease.   PLAN:  1. ED with colonoscopy plus-or-minus ileoscopy.  2. She will hold NSAIDS and aspirins for now.  3.     Continue Aciphex 20 mg daily.  4. NuLev 1 sublingual q.i.d. p.r.n., #18 samples given.  5. Further recommendations to follow.     Bonnie Gordon, P.A.                        Bonnie Gordon, M.D.    LL/MEDQ  D:  11/25/2002  T:  11/25/2002  Job:  [id number]   cc:   Bonnie Gordon, M.D.  [address]  [place]  [place] [address]  Fax: [phone]   Bonnie Gordon, M.D.  [address]  [place]  [place] [address]  Fax: [phone]

## 2010-12-23 NOTE — Procedures (Signed)
NAMEGLORENE, Gordon                 ACCOUNT NO.:  [id number]   MEDICAL RECORD NO.:  [id number]          PATIENT TYPE:  OUT   LOCATION:  SLEEP CENTER                 FACILITY:  [facility]   PHYSICIAN:  Clinton D. Maple Hudson, MD, [name], FACPDATE OF BIRTH:  04-30-1956   DATE OF STUDY:  05/03/2006                              NOCTURNAL POLYSOMNOGRAM   REFERRING PHYSICIAN:  Dr. Onalee Hua L. Shoemaker   INDICATION FOR STUDY:  Hyposomnia with sleep apnea.   EPWORTH SLEEEPINESS SCORE:  19/24, BMI 26, weight 166 pounds.   HOME MEDICATIONS:  Prozac, AcipHex, HCTZ, diclofenac.   SLEEP ARCHITECTURE:  Total sleep time 436 minutes with sleep efficiency 90%.  Stage I was 5%, stage II 85%, stages III and IV were absent.  REM 10% of  total sleep time.  Sleep latency 9 minutes, REM latency 177 minutes, awake  after sleep onset 27 minutes, arousal index 8.  No bedtime medication was  reported.   RESPIRATORY DATA:  Apnea/hypopnea index (AHI, RDI) 6.6 obstructive events  per hour indicating mild obstructive sleep apnea/hypopnea syndrome.  There  were eight obstructive apneas and 40 hypopneas.  Events were not positional.  REM AHI 23 per hour.  She had insufficient events to trigger CPAP titration  by split protocol on this study night.   OXYGEN DATA:  Mild to moderate snoring without oxygen desaturation to a  nadir of 89%.  Mean oxygen saturation through the study was 95% on room air.   CARDIAC DATA:  Normal heart rhythm.   MOVEMENT/PARASOMNIA:  Occasional limb jerk with arousal, 11.4 per hour,  probably insignificant.   IMPRESSION/RECOMMENDATIONS:  1. Mild obstructive sleep apnea/hypopnea syndrome, AHI 6.6 per hour with      nonpositional events.  Mild to moderate snoring and oxygen desaturation      to 89%.  2. Scores in this range are not usually treated with CPAP unless symptoms      are significant and more conservative measures are inadequate.      Consider alternative therapies as appropriate.      Clinton D. Maple Hudson, MD, [facility], FACP  Diplomate, Biomedical engineer of Sleep Medicine  Electronically Signed     CDY/MEDQ  D:  05/12/2006 11:38:57  T:  05/14/2006 02:33:45  Job:  [id number]

## 2010-12-23 NOTE — Op Note (Signed)
NAME:  Bonnie Gordon, Bonnie Gordon                      ACCOUNT NO.:  [id number]   MEDICAL RECORD NO.:  [id number]                   PATIENT TYPE:  AMB   LOCATION:  DAY                                  FACILITY:  [facility]   PHYSICIAN:  Lionel December, M.D.                 DATE OF BIRTH:  12-14-1955   DATE OF PROCEDURE:  12/05/2002  DATE OF DISCHARGE:                                 OPERATIVE REPORT   PROCEDURE:  Esophagogastroduodenoscopy followed by total colonoscopy.   INDICATIONS:  Bonnie Gordon is a 55 year old African American female with recurrent  right-sided abdominal pain associated with nausea and bloating.  She also  has intermittent hematochezia.  She also has frequent heartburn and she has  not responded to Aciphex completely.  She is undergoing diagnostic EGD and  colonoscopy.  The procedures were reviewed with the patient and informed  consent was obtained.   PREMEDICATION:  Cetacaine spray for pharyngeal topical anesthesia, Demerol  50 mg IV, Versed 6 mg IV in divided dose.   INSTRUMENT:  Olympus video system.   FINDINGS:  The procedures performed in the endoscopy suite.  The patient's  vital signs and O2 saturation were monitored during the procedure and  remained stable.   ESOPHAGOGASTRODUODENOSCOPY:  The patient placed in the left lateral  recumbent position and endoscope was passed through the oropharynx without  any difficulty into the esophagus.  Esophagus:  Mucosa of the esophagus was normal throughout.  Squamocolumnar  junction was unremarkable.  No hernias identified.  Stomach:  It had some bile in it but there was no food debris.  It distended  very well with insufflation.  Folds and proximal stomach were normal.  Examination of the mucosa, gastric body, antrum, pyloric channel, as well as  annularis, fundus and cardia was normal.  Duodenum:  Examination of the bulb revealed normal mucosa.  Examination of  post bulbar duodenum revealed normal mucosa and folds.  The  endoscope was withdrawn and the patient was prepared for procedure #2.   TOTAL COLONOSCOPY:  Rectal examination performed.  No abnormality noted on  the external or digital exam.  Scope was placed in the rectum and advanced  to the region of the sigmoid colon and beyond.  Preparation was  satisfactory.  She had scattered diverticula throughout the colon but there  were not too many in number.  Scope was passed to the cecum which was  identified by appendiceal orifice/stump and ileocecal valve.  Scope was  passed into the TL which was carefully examined and it had normal appearing  mucosa.  Pictures were taken for the record and part of her data base.  As  the scope was withdrawn, colonic mucosa was once again carefully examined.  No other mucosal abnormalities were noted.  Rectal mucosa was normal.  Scope  was retroflexed.  Examined the anorectal junction and two large hemorrhoids  were noted below the dentate line.  Endoscope  was straightened and  withdrawn. The patient tolerated the procedure well.   FINAL DIAGNOSIS:  1. Normal esophagogastroduodenoscopy.  2. Scattered diverticula throughout the colon.  3. Normal terminal ileum.  4. Large external hemorrhoids.   IMPRESSION:  I feel she has gastroesophageal reflux disease, irritable bowel  syndrome and her hematochezia is secondary to hemorrhoids.  I do not see any  changes to suggest inflammatory bowel disease.   RECOMMENDATIONS:  1. She will continue anti-reflux measures and Aciphex as before.  2. Bentyl 20 mg at breakfast and evening meal.  3. High fiber diet and Citrucel one tablespoonful daily.  4. She will return for OV four to six weeks from now.                                                Lionel December, M.D.    NR/MEDQ  D:  12/05/2002  T:  12/05/2002  Job:  [id number]   cc:   Lucky Cowboy, M.D.  [address], [address]  [place], [place] [address]  Fax: [phone]

## 2010-12-23 NOTE — Cardiovascular Report (Signed)
NAME:  Bonnie Gordon, Bonnie Gordon                      ACCOUNT NO.:  [id number]   MEDICAL RECORD NO.:  [id number]                   PATIENT TYPE:  OIB   LOCATION:  2852                                 FACILITY:  [facility]   PHYSICIAN:  Nicki Guadalajara, M.D.                  DATE OF BIRTH:  Jun 25, 1956   DATE OF PROCEDURE:  01/16/2003  DATE OF DISCHARGE:                              CARDIAC CATHETERIZATION   INDICATIONS:  The patient is a 55 year old African-American female who had  recently developed chest pain.  The chest pain had some atypical features  but initially she had presented to [facility] for evaluation.  She ultimately was referred for an exercise Cardiolite study which raised  the possibility of anterior to anterolateral wall mild ischemia.  Ejection  fraction was 61%.  She also has a history of mild hypertension.  She was  started on Norvasc as well as Toprol.  She cannot take aspirin and  consequently was started on Plavix until definitive diagnostic cardiac  catheterization.   PROCEDURE:  After premedication with Versed 2 mg intravenously the patient  was prepped and draped in the usual fashion.  The right femoral artery was  punctured anteriorly and a 6-French __________ sheath was inserted.  Diagnostic catheterization was done with 6-French Judkins 4 left and right  coronary catheters.  200 mcg of intracoronary nitroglycerin was administered  down the left coronary system to further evaluate the mild proximal LAD  narrowing to see if this potentially was a spasm.  Pigtail catheter was used  for biplane left ventriculography.  Distal aortography was also performed to  make certain there was no renovascular etiology to her hypertension.  Hemostasis was obtained by direct manual pressure.   She tolerated procedure well.   HEMODYNAMIC DATA:  1. Central aortic pressure was 140/85.  2. Left ventricular pressure was 140/9__________.   ANGIOGRAPHIC DATA:  Left main  coronary artery was a long angiographically  normal vessel that bifurcated into an LAD and left circumflex coronary  artery.   The LAD was a large vessel that extended and wrapped around the LV apex.  This vessel gave rise to several small septal perforating arteries as well  as several small diagonal vessels.  There was mild 10-20% narrowing in the  proximal LAD.  Following IC nitroglycerin administration there was no  improvement in this and the narrowing actually appeared 20%.   There was a diminutive sized ramus intermediate vessel.   The circumflex vessel gave rise to two additional marginal vessels and was  angiographically normal.   The right coronary artery was angiographically normal, gave rise to a PDA  system.   Biplane left ventriculography revealed normal global LV function.  It  calculated ejection fraction as 58%.  There was a suggestion of subtle  posterolateral hypocontractility.   DISTAL AORTOGRAPHY:  Did not reveal any evidence for renal artery stenosis.  There was a  normal aortoiliac system.   IMPRESSION:  1. Normal left ventricular function with an ejection fraction at 58% with     suggestion of subtle posterolateral hypocontractility.  2. 10-20% proximal left anterior descending focal narrowing without benefit     following intracoronary nitroglycerin administration.   RECOMMENDATIONS:  Medical therapy.                                               Nicki Guadalajara, M.D.    TK/MEDQ  D:  01/16/2003  T:  01/16/2003  Job:  [id number]   cc:   Corrie Mckusick, M.D.  [address] [place] [address] [place]  [place]  [place] [address]  Fax: [phone]

## 2010-12-23 NOTE — Discharge Summary (Signed)
Bonnie Gordon, WRENN NO.:  [id number]   MEDICAL RECORD NO.:  [id number]          PATIENT TYPE:  IPS   LOCATION:  0303                          FACILITY:  [facility]   PHYSICIAN:  Jasmine Pang, M.D. DATE OF BIRTH:  03/07/1956   DATE OF ADMISSION:  11/04/2007  DATE OF DISCHARGE:  11/08/2007                               DISCHARGE SUMMARY   IDENTIFICATION:  This is a 55 year old divorced African American female  from [place], [place] who was admitted on November 04, 2007.   HISTORY OF PRESENT ILLNESS:  The patient had suicidal ideation with a  plan to wreck her vehicle.  She is experiencing increased depression,  decreased sleep, and weight loss of 25 pounds in the past 6 months.  She  has been having suicidal thoughts with a plan to wreck the vehicle she  was driving and she cannot contract for safety.  The patient states she  has had depression on and off for a while.  She has been crying.  She  reports periods of drinking too much.  She states her children are her  biggest stress and they do not take her problems seriously.   PAST PSYCHIATRIC HISTORY:  This is the first [facility] admission.  There is no  current psychiatrist.   FAMILY HISTORY:  None that she is aware of.   ALCOHOL AND DRUG HISTORY:  As above.  No other drug use.  The patient is  nonsmoker.   MEDICAL PROBLEMS:  Hypertension, chronic bronchitis, and migraines.   MEDICATIONS:  Prozac in the past.  She took herself off in August.  She  was afraid she would become dependent.  She is also on Flexeril 10 mg  p.o. t.i.d.   DRUG ALLERGIES:  ASPIRIN causes bruising.   PHYSICAL FINDINGS:  There were no acute physical or medical problems  noted.  The patient's physical exam was done at the [facility] ED.   ADMISSION LABORATORY:  CBC was within normal limits.  Potassium was 3.6.  Urinalysis was negative and urine drug screen was negative.   HOSPITAL COURSE:  The patient was friendly and cooperative  with me in  individual sessions.  She states she has been depressed for a long  time.  She has conflict with her daughter.  She does have support from  her ex-husband.  Her depression worsened recently.  She reports some  binge drinking.  She took herself off Prozac because she was afraid she  would become dependent on it.  The patient was restarted on  hydrochlorothiazide 25 mg daily and albuterol 2 puffs q.4-6 h. shortness  of breath p.r.n., and she was also started on Zoloft 50 mg daily.  This  patient reported that she is worried about how things are going to be  when she goes home.  She is worried about her finances.  Her appetite  was good sleep was good.  As hospitalization progressed, she  participated appropriately in unit therapeutic groups and activities.  She has been talking to her daughters and this is going well.  She was  becoming less depressed and less anxious.  On November 08, 2007 mental  status had improved markedly from admission status.  Mood was less  depressed, less anxious.  Affect was consistent with mood.  There was no  suicidal or homicidal ideation.  No thoughts of self injurious behavior.  No auditory or visual hallucinations.  No paranoia or delusions.  Thoughts were logical and goal-directed.  Thought content no predominant  theme.  Cognitive grossly back to baseline.  The patient was wanting to  go home today and it was felt she was safe for discharge.   DISCHARGE DIAGNOSES:  Axis I:  Major depression recurrent, severe,  without psychosis.  Axis II:  None.  Axis III:  Hypertension, migraines, and chronic bronchitis.  Axis IV:  Moderate (problems primary support group, other psychosocial  problems).  Axis V:  Global assessment of functioning was 50 upon discharge.  GAF  was 40 upon admission.  GAF highest past year was 60.   DISCHARGE/PLAN:  There was no specific activity level or dietary  restrictions.   POSTHOSPITAL CARE PLANS:  The patient will see  Dr. Joni Reining at the  [facility] on November 14, 2007, at 2 o'clock.  She will go to family  services for counseling appointments.   DISCHARGE MEDICATIONS:  1. Albuterol 2 puffs as needed for asthma.  2. Zoloft 50 mg daily.  3. Hydrochlorothiazide 25 mg daily.  4. Flexeril 10 mg t.i.d. p.r.n. muscle spasms.      Jasmine Pang, M.D.  Electronically Signed     [facility]/MEDQ  D:  12/07/2007  T:  12/08/2007  Job:  [id number]

## 2011-01-01 ENCOUNTER — Emergency Department (HOSPITAL_COMMUNITY)
Admission: EM | Admit: 2011-01-01 | Discharge: 2011-01-02 | Disposition: A | Discharge status: Home / Self Care | Payer: Medicare Other | Attending: Emergency Medicine | Admitting: Emergency Medicine

## 2011-01-01 ENCOUNTER — Emergency Department (HOSPITAL_COMMUNITY): Payer: Medicare Other

## 2011-01-01 DIAGNOSIS — R1032 Left lower quadrant pain: Secondary | ICD-10-CM | POA: Insufficient documentation

## 2011-01-01 DIAGNOSIS — I10 Essential (primary) hypertension: Secondary | ICD-10-CM | POA: Insufficient documentation

## 2011-01-01 DIAGNOSIS — N390 Urinary tract infection, site not specified: Secondary | ICD-10-CM | POA: Insufficient documentation

## 2011-01-01 DIAGNOSIS — F319 Bipolar disorder, unspecified: Secondary | ICD-10-CM | POA: Insufficient documentation

## 2011-01-01 DIAGNOSIS — A499 Bacterial infection, unspecified: Secondary | ICD-10-CM | POA: Insufficient documentation

## 2011-01-01 DIAGNOSIS — B9689 Other specified bacterial agents as the cause of diseases classified elsewhere: Secondary | ICD-10-CM | POA: Insufficient documentation

## 2011-01-01 DIAGNOSIS — R109 Unspecified abdominal pain: Secondary | ICD-10-CM | POA: Insufficient documentation

## 2011-01-01 DIAGNOSIS — N760 Acute vaginitis: Secondary | ICD-10-CM | POA: Insufficient documentation

## 2011-01-01 DIAGNOSIS — E870 Hyperosmolality and hypernatremia: Secondary | ICD-10-CM | POA: Insufficient documentation

## 2011-01-01 DIAGNOSIS — R300 Dysuria: Secondary | ICD-10-CM | POA: Insufficient documentation

## 2011-01-01 LAB — URINE MICROSCOPIC-ADD ON

## 2011-01-01 LAB — URINALYSIS, ROUTINE W REFLEX MICROSCOPIC
Bilirubin Urine: NEGATIVE
Glucose, UA: NEGATIVE mg/dL
Nitrite: NEGATIVE
Protein, ur: >300 mg/dL — AB
Specific Gravity, Urine: 1.025 (ref 1.005–1.030)
Urobilinogen, UA: 0.2 mg/dL (ref 0.0–1.0)
pH: 6 (ref 5.0–8.0)

## 2011-01-01 LAB — POCT I-STAT, CHEM 8
BUN: 16 mg/dL (ref 6–23)
Calcium, Ion: 1.14 mmol/L (ref 1.12–1.32)
Chloride: 99 mEq/L (ref 96–112)
Creatinine, Ser: 1 mg/dL (ref 0.4–1.2)
Glucose, Bld: 118 mg/dL — ABNORMAL HIGH (ref 70–99)
HCT: 41 % (ref 36.0–46.0)
Hemoglobin: 13.9 g/dL (ref 12.0–15.0)
Potassium: 2.9 mEq/L — ABNORMAL LOW (ref 3.5–5.1)
Sodium: 138 mEq/L (ref 135–145)
TCO2: 32 mmol/L (ref 0–100)

## 2011-01-02 LAB — WET PREP, GENITAL
Trich, Wet Prep: NONE SEEN
Yeast Wet Prep HPF POC: NONE SEEN

## 2011-01-03 LAB — GC/CHLAMYDIA PROBE AMP, GENITAL
Chlamydia, DNA Probe: NEGATIVE
GC Probe Amp, Genital: NEGATIVE

## 2011-01-12 ENCOUNTER — Ambulatory Visit
Admission: RE | Admit: 2011-01-12 | Discharge: 2011-01-12 | Disposition: A | Discharge status: Home / Self Care | Payer: Medicare Other | Source: Ambulatory Visit | Attending: Sports Medicine | Admitting: Sports Medicine

## 2011-01-12 DIAGNOSIS — M79641 Pain in right hand: Secondary | ICD-10-CM

## 2011-05-01 LAB — DIFFERENTIAL
Basophils Absolute: 0
Basophils Relative: 1
Eosinophils Absolute: 0.1
Eosinophils Relative: 2
Lymphocytes Relative: 35
Lymphs Abs: 1.6
Monocytes Absolute: 0.4
Monocytes Relative: 9
Neutro Abs: 2.4
Neutrophils Relative %: 53

## 2011-05-01 LAB — URINALYSIS, ROUTINE W REFLEX MICROSCOPIC
Bilirubin Urine: NEGATIVE
Glucose, UA: NEGATIVE
Hgb urine dipstick: NEGATIVE
Nitrite: NEGATIVE
Protein, ur: NEGATIVE
Specific Gravity, Urine: 1.025
Urobilinogen, UA: 1
pH: 6

## 2011-05-01 LAB — BASIC METABOLIC PANEL
BUN: 7
CO2: 31
Calcium: 9.5
Chloride: 104
Creatinine, Ser: 0.74
GFR calc Af Amer: >60
GFR calc non Af Amer: >60
Glucose, Bld: 85
Potassium: 3.6
Sodium: 142

## 2011-05-01 LAB — RAPID URINE DRUG SCREEN, HOSP PERFORMED
Amphetamines: NOT DETECTED
Barbiturates: NOT DETECTED
Benzodiazepines: NOT DETECTED
Cocaine: NOT DETECTED
Opiates: NOT DETECTED
Tetrahydrocannabinol: NOT DETECTED

## 2011-05-01 LAB — CBC
HCT: 39.5
Hemoglobin: 13.9
MCHC: 35.3
MCV: 82.9
Platelets: 170
RBC: 4.77
RDW: 13.2
WBC: 4.5

## 2011-06-08 ENCOUNTER — Emergency Department (HOSPITAL_COMMUNITY)
Admission: EM | Admit: 2011-06-08 | Discharge: 2011-06-09 | Disposition: A | Discharge status: Home / Self Care | Payer: Medicare Other | Attending: Emergency Medicine | Admitting: Emergency Medicine

## 2011-06-08 DIAGNOSIS — R079 Chest pain, unspecified: Secondary | ICD-10-CM | POA: Insufficient documentation

## 2011-06-08 DIAGNOSIS — I10 Essential (primary) hypertension: Secondary | ICD-10-CM | POA: Insufficient documentation

## 2011-06-09 ENCOUNTER — Emergency Department (HOSPITAL_COMMUNITY): Payer: Medicare Other

## 2011-06-09 LAB — BASIC METABOLIC PANEL
BUN: 13 mg/dL (ref 6–23)
CO2: 31 mEq/L (ref 19–32)
Calcium: 9.5 mg/dL (ref 8.4–10.5)
Chloride: 98 mEq/L (ref 96–112)
Creatinine, Ser: 0.71 mg/dL (ref 0.50–1.10)
GFR calc Af Amer: >90 mL/min (ref >90)
GFR calc non Af Amer: >90 mL/min (ref >90)
Glucose, Bld: 118 mg/dL — ABNORMAL HIGH (ref 70–99)
Potassium: 2.7 mEq/L — CL (ref 3.5–5.1)
Sodium: 137 mEq/L (ref 135–145)

## 2011-06-09 LAB — CBC
HCT: 38.1 % (ref 36.0–46.0)
Hemoglobin: 13.1 g/dL (ref 12.0–15.0)
MCH: 28.9 pg (ref 26.0–34.0)
MCHC: 34.4 g/dL (ref 30.0–36.0)
MCV: 83.9 fL (ref 78.0–100.0)
Platelets: 162 10*3/uL (ref 150–400)
RBC: 4.54 MIL/uL (ref 3.87–5.11)
RDW: 13.4 % (ref 11.5–15.5)
WBC: 6.5 10*3/uL (ref 4.0–10.5)

## 2011-06-09 LAB — DIFFERENTIAL
Basophils Absolute: 0.1 10*3/uL (ref 0.0–0.1)
Basophils Relative: 1 % (ref 0–1)
Eosinophils Absolute: 0.2 10*3/uL (ref 0.0–0.7)
Eosinophils Relative: 3 % (ref 0–5)
Lymphocytes Relative: 41 % (ref 12–46)
Lymphs Abs: 2.7 10*3/uL (ref 0.7–4.0)
Monocytes Absolute: 0.6 10*3/uL (ref 0.1–1.0)
Monocytes Relative: 10 % (ref 3–12)
Neutro Abs: 3 10*3/uL (ref 1.7–7.7)
Neutrophils Relative %: 46 % (ref 43–77)

## 2011-06-09 LAB — CK TOTAL AND CKMB (NOT AT ARMC)
CK, MB: 3.5 ng/mL (ref 0.3–4.0)
Relative Index: 1.4 (ref 0.0–2.5)
Total CK: 242 U/L — ABNORMAL HIGH (ref 7–177)

## 2011-06-09 LAB — POCT I-STAT TROPONIN I: Troponin i, poc: 0.01 ng/mL (ref 0.00–0.08)

## 2011-08-14 ENCOUNTER — Other Ambulatory Visit (HOSPITAL_BASED_OUTPATIENT_CLINIC_OR_DEPARTMENT_OTHER): Payer: Self-pay | Admitting: Internal Medicine

## 2011-08-14 DIAGNOSIS — N390 Urinary tract infection, site not specified: Secondary | ICD-10-CM | POA: Diagnosis not present

## 2011-08-14 DIAGNOSIS — F329 Major depressive disorder, single episode, unspecified: Secondary | ICD-10-CM | POA: Diagnosis not present

## 2011-08-14 DIAGNOSIS — Z1231 Encounter for screening mammogram for malignant neoplasm of breast: Secondary | ICD-10-CM

## 2011-08-14 DIAGNOSIS — F3289 Other specified depressive episodes: Secondary | ICD-10-CM | POA: Diagnosis not present

## 2011-08-14 DIAGNOSIS — Z0000 Encounter for general adult medical examination without abnormal findings: Secondary | ICD-10-CM | POA: Diagnosis not present

## 2011-08-14 DIAGNOSIS — Z0110 Encounter for examination of ears and hearing without abnormal findings: Secondary | ICD-10-CM | POA: Diagnosis not present

## 2011-08-14 DIAGNOSIS — E785 Hyperlipidemia, unspecified: Secondary | ICD-10-CM | POA: Diagnosis not present

## 2011-08-14 DIAGNOSIS — E119 Type 2 diabetes mellitus without complications: Secondary | ICD-10-CM | POA: Diagnosis not present

## 2011-08-14 DIAGNOSIS — I10 Essential (primary) hypertension: Secondary | ICD-10-CM | POA: Diagnosis not present

## 2011-08-14 DIAGNOSIS — K219 Gastro-esophageal reflux disease without esophagitis: Secondary | ICD-10-CM | POA: Diagnosis not present

## 2011-08-16 ENCOUNTER — Ambulatory Visit (HOSPITAL_BASED_OUTPATIENT_CLINIC_OR_DEPARTMENT_OTHER): Payer: Medicare Other

## 2011-09-05 DIAGNOSIS — I10 Essential (primary) hypertension: Secondary | ICD-10-CM | POA: Diagnosis not present

## 2011-09-05 DIAGNOSIS — K219 Gastro-esophageal reflux disease without esophagitis: Secondary | ICD-10-CM | POA: Diagnosis not present

## 2011-09-05 DIAGNOSIS — F329 Major depressive disorder, single episode, unspecified: Secondary | ICD-10-CM | POA: Diagnosis not present

## 2011-09-05 DIAGNOSIS — R51 Headache: Secondary | ICD-10-CM | POA: Diagnosis not present

## 2011-09-05 DIAGNOSIS — E119 Type 2 diabetes mellitus without complications: Secondary | ICD-10-CM | POA: Diagnosis not present

## 2011-09-05 DIAGNOSIS — F3289 Other specified depressive episodes: Secondary | ICD-10-CM | POA: Diagnosis not present

## 2011-09-05 DIAGNOSIS — M549 Dorsalgia, unspecified: Secondary | ICD-10-CM | POA: Diagnosis not present

## 2011-09-05 DIAGNOSIS — E785 Hyperlipidemia, unspecified: Secondary | ICD-10-CM | POA: Diagnosis not present

## 2011-09-21 ENCOUNTER — Ambulatory Visit (HOSPITAL_BASED_OUTPATIENT_CLINIC_OR_DEPARTMENT_OTHER)
Admission: RE | Admit: 2011-09-21 | Discharge: 2011-09-21 | Disposition: A | Discharge status: Home / Self Care | Payer: Medicare Other | Source: Ambulatory Visit | Attending: Internal Medicine | Admitting: Internal Medicine

## 2011-09-21 DIAGNOSIS — Z1231 Encounter for screening mammogram for malignant neoplasm of breast: Secondary | ICD-10-CM | POA: Diagnosis not present

## 2011-09-22 DIAGNOSIS — IMO0002 Reserved for concepts with insufficient information to code with codable children: Secondary | ICD-10-CM | POA: Diagnosis not present

## 2011-10-03 DIAGNOSIS — F3289 Other specified depressive episodes: Secondary | ICD-10-CM | POA: Diagnosis not present

## 2011-10-03 DIAGNOSIS — E785 Hyperlipidemia, unspecified: Secondary | ICD-10-CM | POA: Diagnosis not present

## 2011-10-03 DIAGNOSIS — I10 Essential (primary) hypertension: Secondary | ICD-10-CM | POA: Diagnosis not present

## 2011-10-03 DIAGNOSIS — E119 Type 2 diabetes mellitus without complications: Secondary | ICD-10-CM | POA: Diagnosis not present

## 2011-10-03 DIAGNOSIS — K219 Gastro-esophageal reflux disease without esophagitis: Secondary | ICD-10-CM | POA: Diagnosis not present

## 2011-10-03 DIAGNOSIS — M549 Dorsalgia, unspecified: Secondary | ICD-10-CM | POA: Diagnosis not present

## 2011-10-03 DIAGNOSIS — R51 Headache: Secondary | ICD-10-CM | POA: Diagnosis not present

## 2011-10-03 DIAGNOSIS — F329 Major depressive disorder, single episode, unspecified: Secondary | ICD-10-CM | POA: Diagnosis not present

## 2011-10-04 ENCOUNTER — Encounter (HOSPITAL_BASED_OUTPATIENT_CLINIC_OR_DEPARTMENT_OTHER): Payer: Self-pay | Admitting: *Deleted

## 2011-10-04 ENCOUNTER — Emergency Department (HOSPITAL_BASED_OUTPATIENT_CLINIC_OR_DEPARTMENT_OTHER)
Admission: EM | Admit: 2011-10-04 | Discharge: 2011-10-05 | Disposition: A | Discharge status: Home / Self Care | Payer: Medicare Other | Attending: Emergency Medicine | Admitting: Emergency Medicine

## 2011-10-04 DIAGNOSIS — B349 Viral infection, unspecified: Secondary | ICD-10-CM

## 2011-10-04 DIAGNOSIS — B9789 Other viral agents as the cause of diseases classified elsewhere: Secondary | ICD-10-CM | POA: Insufficient documentation

## 2011-10-04 DIAGNOSIS — J029 Acute pharyngitis, unspecified: Secondary | ICD-10-CM | POA: Insufficient documentation

## 2011-10-04 DIAGNOSIS — R509 Fever, unspecified: Secondary | ICD-10-CM | POA: Insufficient documentation

## 2011-10-04 DIAGNOSIS — I10 Essential (primary) hypertension: Secondary | ICD-10-CM | POA: Insufficient documentation

## 2011-10-04 DIAGNOSIS — E119 Type 2 diabetes mellitus without complications: Secondary | ICD-10-CM | POA: Insufficient documentation

## 2011-10-04 HISTORY — DX: Essential (primary) hypertension: I10

## 2011-10-04 MED ORDER — ACETAMINOPHEN 325 MG PO TABS
650.0000 mg | ORAL_TABLET | Freq: Once | ORAL | Status: AC
  Administered 2011-10-05: 650 mg via ORAL
  Filled 2011-10-04: qty 2

## 2011-10-04 NOTE — ED Notes (Signed)
C/o fever, chills, body aches, and sore throat since yesterday

## 2011-10-05 ENCOUNTER — Emergency Department (INDEPENDENT_AMBULATORY_CARE_PROVIDER_SITE_OTHER): Payer: Medicare Other

## 2011-10-05 DIAGNOSIS — R079 Chest pain, unspecified: Secondary | ICD-10-CM | POA: Diagnosis not present

## 2011-10-05 DIAGNOSIS — B9789 Other viral agents as the cause of diseases classified elsewhere: Secondary | ICD-10-CM | POA: Diagnosis not present

## 2011-10-05 DIAGNOSIS — R509 Fever, unspecified: Secondary | ICD-10-CM

## 2011-10-05 DIAGNOSIS — I10 Essential (primary) hypertension: Secondary | ICD-10-CM | POA: Diagnosis not present

## 2011-10-05 DIAGNOSIS — E119 Type 2 diabetes mellitus without complications: Secondary | ICD-10-CM | POA: Diagnosis not present

## 2011-10-05 DIAGNOSIS — J029 Acute pharyngitis, unspecified: Secondary | ICD-10-CM | POA: Diagnosis not present

## 2011-10-05 MED ORDER — OSELTAMIVIR PHOSPHATE 75 MG PO CAPS: 75.0000 mg | ORAL_CAPSULE | Freq: Two times a day (BID) | ORAL | Status: AC

## 2011-10-05 MED ORDER — OSELTAMIVIR PHOSPHATE 75 MG PO CAPS
75.0000 mg | ORAL_CAPSULE | Freq: Once | ORAL | Status: AC
  Administered 2011-10-05: 75 mg via ORAL
  Filled 2011-10-05: qty 1

## 2011-10-05 NOTE — Discharge Instructions (Signed)
Viral Infections  Rest, be sure to drink plenty of clear liquids, and take Tylenol as needed.  Continue Tamiflu as prescribed and follow up in the clinic with your Dr. Return here for any worsening condition, trouble breathing, vomiting or fevers despite Tylenol  A viral infection can be caused by different types of viruses.Most viral infections are not serious and resolve on their own. However, some infections may cause severe symptoms and may lead to further complications. SYMPTOMS Viruses can frequently cause:  sore throat.   Aches and pains.   Headaches.   Runny nose.   Different types of rashes.   Watery eyes.   Tiredness.   Cough.   Loss of appetite.   Gastrointestinal infections, resulting in nausea, vomiting, and diarrhea.  These symptoms do not respond to antibiotics because the infection is not caused by bacteria. However, you might catch a bacterial infection following the viral infection. This is sometimes called a "superinfection." Symptoms of such a bacterial infection may include:  Worsening sore throat with pus and difficulty swallowing.   Swollen neck glands.   Chills and a high or persistent fever.   Severe headache.   Tenderness over the sinuses.   Persistent overall ill feeling (malaise), muscle aches, and tiredness (fatigue).   Persistent cough.   Yellow, green, or brown mucus production with coughing.  HOME CARE INSTRUCTIONS   Only take over-the-counter or prescription medicines for pain, discomfort, diarrhea, or fever as directed by your caregiver.   Drink enough water and fluids to keep your urine clear or pale yellow. Sports drinks can provide valuable electrolytes, sugars, and hydration.   Get plenty of rest and maintain proper nutrition. Soups and broths with crackers or rice are fine.  SEEK IMMEDIATE MEDICAL CARE IF:   You have severe headaches, shortness of breath, chest pain, neck pain, or an unusual rash.   You have uncontrolled  vomiting, diarrhea, or you are unable to keep down fluids.   You or your child has an oral temperature above 102 F (38.9 C), not controlled by medicine.   Your baby is older than 3 months with a rectal temperature of 102 F (38.9 C) or higher.   Your baby is 50 months old or younger with a rectal temperature of 100.4 F (38 C) or higher.  MAKE SURE YOU:   Understand these instructions.   Will watch your condition.   Will get help right away if you are not doing well or get worse.  Document Released: 05/03/2005 Document Revised: 04/05/2011 Document Reviewed: 11/28/2010 [facility] Patient Information 2012 [place], [place].

## 2011-10-05 NOTE — ED Provider Notes (Signed)
History     CSN: [id number]  Arrival date & time 10/04/11  2304   First MD Initiated Contact with Patient 10/04/11 2307      Chief Complaint  Patient presents with  . Sore Throat  . Fever    (Consider location/radiation/quality/duration/timing/severity/associated sxs/prior treatment) Patient is a 56 y.o. female presenting with pharyngitis and fever. The history is provided by the patient.  Sore Throat This is a new problem. The current episode started 12 to 24 hours ago. The problem occurs constantly. The problem has been gradually worsening. Associated symptoms include headaches. Pertinent negatives include no chest pain, no abdominal pain and no shortness of breath. The symptoms are aggravated by nothing. The symptoms are relieved by nothing. She has tried nothing for the symptoms.  Fever Primary symptoms of the febrile illness include fever, headaches and cough. Primary symptoms do not include shortness of breath, abdominal pain, nausea, vomiting, diarrhea, dysuria or rash.  The headache is not associated with eye pain or neck stiffness.   Patient is a type II diabetic with body aches, myalgias, sore throat and fever today. Onset of symptoms this morning. No known sick contacts but does work as a Facilities manager for elderly. No travel, rashes or recent antibiotics. Chest some dry cough. No nausea vomiting or diarrhea. No abdominal pain. No chest pain or shortness of breath. Symptoms moderate in severity progressively worsening since this morning. He is a mild associated headache with no neck stiffness, photophobia, or altered mental status.  Past Medical History  Diagnosis Date  . Diabetes mellitus   . Hypertension     History reviewed. No pertinent past surgical history.  History reviewed. No pertinent family history.  History  Substance Use Topics  . Smoking status: Never Smoker   . Smokeless tobacco: Not on file  . Alcohol Use: No    OB History    Grav Para Term Preterm  Abortions TAB SAB Ect Mult Living                  Review of Systems  Constitutional: Positive for fever. Negative for chills.  HENT: Positive for sore throat. Negative for ear pain, congestion, neck pain, neck stiffness and sinus pressure.   Eyes: Negative for pain.  Respiratory: Positive for cough. Negative for shortness of breath.   Cardiovascular: Negative for chest pain.  Gastrointestinal: Negative for nausea, vomiting, abdominal pain and diarrhea.  Genitourinary: Negative for dysuria.  Musculoskeletal: Negative for back pain.  Skin: Negative for rash.  Neurological: Positive for headaches.  All other systems reviewed and are negative.    Allergies  Latex  Home Medications   Current Outpatient Rx  Name Route Sig Dispense Refill  . ALBUTEROL SULFATE HFA 108 (90 BASE) MCG/ACT IN AERS Inhalation Inhale 2 puffs into the lungs every 6 (six) hours as needed. For wheezing    . ATENOLOL PO Oral Take by mouth. Pharmacy is closed and patient doesn't know mg and i cannot locate a printer to run an RX capture here at [facility]    . EZETIMIBE-SIMVASTATIN 10-40 MG PO TABS Oral Take 1 tablet by mouth at bedtime.    Marland Kitchen METFORMIN HCL 500 MG PO TABS Oral Take 500 mg by mouth 2 (two) times daily with a meal.      BP 126/82  Pulse 94  Temp(Src) 98.4 F (36.9 C) (Oral)  Resp 18  Ht 5\' 5"  (1.651 m)  Wt 174 lb (78.926 kg)  BMI 28.96 kg/m2  SpO2  96%  Physical Exam  Constitutional: She is oriented to person, place, and time. She appears well-developed and well-nourished.  HENT:  Head: Normocephalic and atraumatic.  Right Ear: External ear normal.  Left Ear: External ear normal.  Nose: Nose normal.  Mouth/Throat: Oropharynx is clear and moist. No oropharyngeal exudate.       Uvula midline no tonsillar enlargement or exudates  Eyes: Conjunctivae and EOM are normal. Pupils are equal, round, and reactive to light.  Neck: Trachea normal. Neck supple. No thyromegaly present.    Cardiovascular: Normal rate, regular rhythm, S1 normal, S2 normal and normal pulses.     No systolic murmur is present   No diastolic murmur is present  Pulses:      Radial pulses are 2+ on the right side, and 2+ on the left side.  Pulmonary/Chest: Effort normal and breath sounds normal. No stridor. She has no wheezes. She has no rhonchi. She has no rales. She exhibits no tenderness.       Mild dry cough during exam  Abdominal: Soft. Normal appearance and bowel sounds are normal. She exhibits no mass. There is no tenderness. There is no rebound, no guarding, no CVA tenderness and negative Murphy's sign.  Musculoskeletal: Normal range of motion. She exhibits no edema and no tenderness.       BLE:s Calves nontender, no cords or erythema, negative Homans sign  Lymphadenopathy:    She has no cervical adenopathy.  Neurological: She is alert and oriented to person, place, and time. She has normal strength. No cranial nerve deficit or sensory deficit. GCS eye subscore is 4. GCS verbal subscore is 5. GCS motor subscore is 6.  Skin: Skin is warm and dry. No rash noted. She is not diaphoretic.  Psychiatric: Her speech is normal.       Cooperative and appropriate    ED Course  Procedures (including critical care time)   Dg Chest 2 View  10/05/2011  *RADIOLOGY REPORT*  Clinical Data: Mid chest pain, sore throat and fever.  History of diabetes.  CHEST - 2 VIEW  Comparison: Chest radiograph performed 06/09/2011  Findings: The lungs are well-aerated and clear.  Medial right basilar density appears stable from multiple prior studies.  There is no evidence of focal opacification, pleural effusion or pneumothorax.  The heart is normal in size; the mediastinal contour is within normal limits.  No acute osseous abnormalities are seen.  Clips are noted within the right upper quadrant, reflecting prior cholecystectomy.  IMPRESSION: No acute cardiopulmonary process seen.  Original Report Authenticated By: Tonia Ghent, M.D.      MDM   Type II diabetic with flulike symptoms. Chest x-ray obtained. Tylenol and Tamiflu initiated. No hypotension or hypoxia. Stable for discharge home and outpatient followup. Reliable historian verbalizes understanding viral precautions. No indication for further workup at this time. Plan Tamiflu and primary care followup.        Sunnie Nielsen, MD 10/05/11 [phone]

## 2011-10-10 DIAGNOSIS — R51 Headache: Secondary | ICD-10-CM | POA: Diagnosis not present

## 2011-10-10 DIAGNOSIS — M549 Dorsalgia, unspecified: Secondary | ICD-10-CM | POA: Diagnosis not present

## 2011-10-10 DIAGNOSIS — R05 Cough: Secondary | ICD-10-CM | POA: Diagnosis not present

## 2011-10-10 DIAGNOSIS — F329 Major depressive disorder, single episode, unspecified: Secondary | ICD-10-CM | POA: Diagnosis not present

## 2011-10-10 DIAGNOSIS — B9789 Other viral agents as the cause of diseases classified elsewhere: Secondary | ICD-10-CM | POA: Diagnosis not present

## 2011-10-10 DIAGNOSIS — E119 Type 2 diabetes mellitus without complications: Secondary | ICD-10-CM | POA: Diagnosis not present

## 2011-10-10 DIAGNOSIS — F3289 Other specified depressive episodes: Secondary | ICD-10-CM | POA: Diagnosis not present

## 2011-10-10 DIAGNOSIS — I10 Essential (primary) hypertension: Secondary | ICD-10-CM | POA: Diagnosis not present

## 2011-10-10 DIAGNOSIS — R059 Cough, unspecified: Secondary | ICD-10-CM | POA: Diagnosis not present

## 2011-10-10 DIAGNOSIS — E785 Hyperlipidemia, unspecified: Secondary | ICD-10-CM | POA: Diagnosis not present

## 2011-10-23 DIAGNOSIS — R1031 Right lower quadrant pain: Secondary | ICD-10-CM | POA: Diagnosis not present

## 2011-10-23 DIAGNOSIS — IMO0002 Reserved for concepts with insufficient information to code with codable children: Secondary | ICD-10-CM | POA: Diagnosis not present

## 2011-11-01 ENCOUNTER — Encounter (HOSPITAL_BASED_OUTPATIENT_CLINIC_OR_DEPARTMENT_OTHER): Payer: Self-pay | Admitting: *Deleted

## 2011-11-01 ENCOUNTER — Emergency Department (HOSPITAL_BASED_OUTPATIENT_CLINIC_OR_DEPARTMENT_OTHER)
Admission: EM | Admit: 2011-11-01 | Discharge: 2011-11-01 | Disposition: A | Discharge status: Home / Self Care | Payer: Medicare Other | Attending: Emergency Medicine | Admitting: Emergency Medicine

## 2011-11-01 DIAGNOSIS — R109 Unspecified abdominal pain: Secondary | ICD-10-CM | POA: Insufficient documentation

## 2011-11-01 DIAGNOSIS — Z79899 Other long term (current) drug therapy: Secondary | ICD-10-CM | POA: Insufficient documentation

## 2011-11-01 DIAGNOSIS — R51 Headache: Secondary | ICD-10-CM | POA: Diagnosis not present

## 2011-11-01 DIAGNOSIS — E119 Type 2 diabetes mellitus without complications: Secondary | ICD-10-CM | POA: Insufficient documentation

## 2011-11-01 DIAGNOSIS — E7800 Pure hypercholesterolemia, unspecified: Secondary | ICD-10-CM | POA: Diagnosis not present

## 2011-11-01 DIAGNOSIS — I10 Essential (primary) hypertension: Secondary | ICD-10-CM | POA: Diagnosis not present

## 2011-11-01 HISTORY — DX: Pure hypercholesterolemia, unspecified: E78.00

## 2011-11-01 LAB — URINALYSIS, ROUTINE W REFLEX MICROSCOPIC
Bilirubin Urine: NEGATIVE
Glucose, UA: NEGATIVE mg/dL
Hgb urine dipstick: NEGATIVE
Ketones, ur: NEGATIVE mg/dL
Leukocytes, UA: NEGATIVE
Nitrite: NEGATIVE
Protein, ur: NEGATIVE mg/dL
Specific Gravity, Urine: 1.01 (ref 1.005–1.030)
Urobilinogen, UA: 0.2 mg/dL (ref 0.0–1.0)
pH: 6 (ref 5.0–8.0)

## 2011-11-01 MED ORDER — DIPHENHYDRAMINE HCL 50 MG/ML IJ SOLN
25.0000 mg | Freq: Once | INTRAMUSCULAR | Status: AC
  Administered 2011-11-01: 25 mg via INTRAMUSCULAR
  Filled 2011-11-01: qty 1

## 2011-11-01 MED ORDER — PROMETHAZINE HCL 25 MG/ML IJ SOLN
25.0000 mg | Freq: Once | INTRAMUSCULAR | Status: AC
  Administered 2011-11-01: 25 mg via INTRAMUSCULAR
  Filled 2011-11-01: qty 1

## 2011-11-01 MED ORDER — DEXAMETHASONE SODIUM PHOSPHATE 10 MG/ML IJ SOLN
10.0000 mg | Freq: Once | INTRAMUSCULAR | Status: AC
  Administered 2011-11-01: 10 mg via INTRAMUSCULAR
  Filled 2011-11-01: qty 1

## 2011-11-01 MED ORDER — KETOROLAC TROMETHAMINE 60 MG/2ML IM SOLN
60.0000 mg | Freq: Once | INTRAMUSCULAR | Status: AC
  Administered 2011-11-01: 60 mg via INTRAMUSCULAR
  Filled 2011-11-01: qty 2

## 2011-11-01 NOTE — ED Provider Notes (Signed)
History     CSN: [id number]  Arrival date & time 11/01/11  Bonnie Gordon   First MD Initiated Contact with Patient 11/01/11 2019      Chief Complaint  Patient presents with  . Headache  . Abdominal Pain    (Consider location/radiation/quality/duration/timing/severity/associated sxs/prior treatment) HPI Comments: Patient presents with 2 complaints today.  First complaint is headache.  Patient has been diagnosed with migraines in the past and this is a similar headache.  This particular headache started yesterday and has been persistent.  She describes it as a generalized achy feeling.  She did try hydrocodone at home without relief.  No significant nausea, vomiting, fevers.  No weakness or numbness or visual changes.  Patient also notes some suprapubic abdominal pain that is also been going on since yesterday.  No dysuria symptoms.  No nausea, vomiting or diarrhea.  Patient had a hysterectomy, appendectomy and cholecystectomy.  No unusual vaginal bleeding.  Patient does note that she is undergoing bladder training with the urologist because she doesn't empty her bladder as frequently as they would like.  Patient is a 56 y.o. female presenting with headaches and abdominal pain. The history is provided by the patient. No language interpreter was used.  Headache  This is a recurrent problem. The current episode started yesterday. The problem occurs constantly. The problem has not changed since onset.Quality: aching. The pain is moderate. The pain does not radiate. Pertinent negatives include no anorexia, no fever, no malaise/fatigue, no chest pressure, no near-syncope, no orthopnea, no palpitations, no syncope, no shortness of breath, no nausea and no vomiting. She has tried oral narcotic analgesics for the symptoms.  Abdominal Pain The primary symptoms of the illness include abdominal pain. The primary symptoms of the illness do not include fever, shortness of breath, nausea, vomiting, diarrhea, dysuria or  vaginal discharge.  Symptoms associated with the illness do not include chills, anorexia or back pain.    Past Medical History  Diagnosis Date  . Diabetes mellitus   . Hypertension   . Hypercholesteremia     History reviewed. No pertinent past surgical history.  History reviewed. No pertinent family history.  History  Substance Use Topics  . Smoking status: Never Smoker   . Smokeless tobacco: Not on file  . Alcohol Use: No    OB History    Grav Para Term Preterm Abortions TAB SAB Ect Mult Living                  Review of Systems  Constitutional: Negative.  Negative for fever, chills and malaise/fatigue.  Eyes: Negative.  Negative for discharge and redness.  Respiratory: Negative.  Negative for cough and shortness of breath.   Cardiovascular: Negative.  Negative for chest pain, palpitations, orthopnea, syncope and near-syncope.  Gastrointestinal: Positive for abdominal pain. Negative for nausea, vomiting, diarrhea and anorexia.  Genitourinary: Positive for pelvic pain. Negative for dysuria and vaginal discharge.  Musculoskeletal: Negative.  Negative for back pain.  Skin: Negative.  Negative for color change and rash.  Neurological: Positive for headaches. Negative for syncope.  Hematological: Negative.  Negative for adenopathy.  Psychiatric/Behavioral: Negative.  Negative for confusion.  All other systems reviewed and are negative.    Allergies  Latex  Home Medications   Current Outpatient Rx  Name Route Sig Dispense Refill  . ALBUTEROL SULFATE HFA 108 (90 BASE) MCG/ACT IN AERS Inhalation Inhale 2 puffs into the lungs every 6 (six) hours as needed. For shortness of breath    .  ATENOLOL 50 MG PO TABS Oral Take 50 mg by mouth daily.    Marland Kitchen HYDROCODONE-ACETAMINOPHEN 5-400 MG PO TABS Oral Take 1 tablet by mouth every 6 (six) hours as needed. For pain    . METFORMIN HCL 500 MG PO TABS Oral Take 500 mg by mouth 2 (two) times daily with a meal.    . POTASSIUM CHLORIDE  CRYS ER 15 MEQ PO TBCR Oral Take 15 mEq by mouth 2 (two) times daily.    Marland Kitchen SIMVASTATIN 40 MG PO TABS Oral Take 40 mg by mouth every evening.      BP 126/80  Pulse 80  Temp(Src) 97.9 F (36.6 C) (Oral)  Resp 20  Ht 5\' 5"  (1.651 m)  Wt 178 lb (80.74 kg)  BMI 29.62 kg/m2  SpO2 100%  Physical Exam  Nursing note and vitals reviewed. Constitutional: She is oriented to person, place, and time. She appears well-developed and well-nourished.  Non-toxic appearance. She does not have a sickly appearance.  HENT:  Head: Normocephalic and atraumatic.  Eyes: Conjunctivae, EOM and lids are normal. Pupils are equal, round, and reactive to light. No scleral icterus.  Neck: Trachea normal and normal range of motion. Neck supple.  Cardiovascular: Normal rate, regular rhythm and normal heart sounds.   Pulmonary/Chest: Effort normal and breath sounds normal. No respiratory distress. She has no wheezes. She has no rales.  Abdominal: Soft. Normal appearance. There is no tenderness. There is no rebound, no guarding and no CVA tenderness.  Musculoskeletal: Normal range of motion.  Neurological: She is alert and oriented to person, place, and time. She has normal strength.  Skin: Skin is warm, dry and intact. No rash noted.  Psychiatric: She has a normal mood and affect. Her behavior is normal. Judgment and thought content normal.    ED Course  Procedures (including critical care time)  Results for orders placed during the hospital encounter of 11/01/11  URINALYSIS, ROUTINE W REFLEX MICROSCOPIC      Component Value Range   Color, Urine YELLOW  YELLOW    APPearance CLEAR  CLEAR    Specific Gravity, Urine 1.010  1.005 - 1.030    pH 6.0  5.0 - 8.0    Glucose, UA NEGATIVE  NEGATIVE (mg/dL)   Hgb urine dipstick NEGATIVE  NEGATIVE    Bilirubin Urine NEGATIVE  NEGATIVE    Ketones, ur NEGATIVE  NEGATIVE (mg/dL)   Protein, ur NEGATIVE  NEGATIVE (mg/dL)   Urobilinogen, UA 0.2  0.0 - 1.0 (mg/dL)   Nitrite  NEGATIVE  NEGATIVE    Leukocytes, UA NEGATIVE  NEGATIVE        MDM  Patient with headache that is not sudden in onset to suggest subarachnoid hemorrhage.  She has no fevers or other signs of infection to suggest meningitis.  Patient has had similar headaches this one is just more persistent.  I will give her a headache cocktail here to assist with her symptoms.  Patient abdominal pain is of unclear etiology.  She has no GI symptoms to suggest a GI cause.  She has had a hysterectomy so it is not related to a uterine problem at this time.  Patient does note she is undergoing some bladder issues and has seen urologist because she doesn't empty her bladder as often as necessary and this may relate to that.  Patient does not have a UTI at this time.  Patient's abdominal exam is benign and I believe once patient's headache is improved she will be safely  discharged home with followup with her primary care physician and her urologist.        Nat Christen, MD 11/01/11 2038

## 2011-11-01 NOTE — ED Notes (Signed)
Pt reports abdominal pain has improved, headache still persist, no improvement at this time, lights dimmed, fluids offered

## 2011-11-01 NOTE — ED Notes (Signed)
Pt amb to triage with quick steady gait in nad. Pt reports ha off and on x several months, saw her pcp and was told potassium was low, has been taking supplement. Pt states she also has suprapubic pain radiating to her low back with dysuria.

## 2011-11-01 NOTE — Discharge Instructions (Signed)
Abdominal Pain Abdominal pain can be caused by many things. Your caregiver decides the seriousness of your pain by an examination and possibly blood tests and X-rays. Many cases can be observed and treated at home. Most abdominal pain is not caused by a disease and will probably improve without treatment. However, in many cases, more time must pass before a clear cause of the pain can be found. Before that point, it may not be known if you need more testing, or if hospitalization or surgery is needed. HOME CARE INSTRUCTIONS   Do not take laxatives unless directed by your caregiver.   Take pain medicine only as directed by your caregiver.   Only take over-the-counter or prescription medicines for pain, discomfort, or fever as directed by your caregiver.   Try a clear liquid diet (broth, tea, or water) for as long as directed by your caregiver. Slowly move to a bland diet as tolerated.  SEEK IMMEDIATE MEDICAL CARE IF:   The pain does not go away.   You have a fever.   You keep throwing up (vomiting).   The pain is felt only in portions of the abdomen. Pain in the right side could possibly be appendicitis. In an adult, pain in the left lower portion of the abdomen could be colitis or diverticulitis.   You pass bloody or black tarry stools.  MAKE SURE YOU:   Understand these instructions.   Will watch your condition.   Will get help right away if you are not doing well or get worse.  Document Released: 05/03/2005 Document Revised: 07/13/2011 Document Reviewed: 03/11/2008 [facility] Patient Information 2012 [place], [place].  Headache, General, Unknown Cause The specific cause of your headache may not have been found today. There are many causes and types of headache. A few common ones are:  Tension headache.   Migraine.   Infections (examples: dental and sinus infections).   Bone and/or joint problems in the neck or jaw.   Depression.   Eye problems.  These headaches are not  life threatening.  Headaches can sometimes be diagnosed by a patient history and a physical exam. Sometimes, lab and imaging studies (such as x-ray and/or CT scan) are used to rule out more serious problems. In some cases, a spinal tap (lumbar puncture) may be requested. There are many times when your exam and tests may be normal on the first visit even when there is a serious problem causing your headaches. Because of that, it is very important to follow up with your doctor or local clinic for further evaluation. FINDING OUT THE RESULTS OF TESTS  If a radiology test was performed, a radiologist will review your results.   You will be contacted by the emergency department or your physician if any test results require a change in your treatment plan.   Not all test results may be available during your visit. If your test results are not back during the visit, make an appointment with your caregiver to find out the results. Do not assume everything is normal if you have not heard from your caregiver or the medical facility. It is important for you to follow up on all of your test results.  HOME CARE INSTRUCTIONS   Keep follow-up appointments with your caregiver, or any specialist referral.   Only take over-the-counter or prescription medicines for pain, discomfort, or fever as directed by your caregiver.   Biofeedback, massage, or other relaxation techniques may be helpful.   Ice packs or heat applied to  the head and neck can be used. Do this three to four times per day, or as needed.   Call your doctor if you have any questions or concerns.   If you smoke, you should quit.  SEEK MEDICAL CARE IF:   You develop problems with medications prescribed.   You do not respond to or obtain relief from medications.   You have a change from the usual headache.   You develop nausea or vomiting.  SEEK IMMEDIATE MEDICAL CARE IF:   If your headache becomes severe.   You have an unexplained oral  temperature above 102 F (38.9 C), or as your caregiver suggests.   You have a stiff neck.   You have loss of vision.   You have muscular weakness.   You have loss of muscular control.   You develop severe symptoms different from your first symptoms.   You start losing your balance or have trouble walking.   You feel faint or pass out.  MAKE SURE YOU:   Understand these instructions.   Will watch your condition.   Will get help right away if you are not doing well or get worse.  Document Released: 07/24/2005 Document Revised: 07/13/2011 Document Reviewed: 03/12/2008 [facility] Patient Information 2012 [place], [place].

## 2011-11-01 NOTE — ED Notes (Signed)
headhache started 3/26 am reports pain in back of head dull ache, took hydrocodone this am with no relief, reports stomach hurting since 3/26 pm, last bm 3/26, reports lots of gas

## 2011-11-28 DIAGNOSIS — H2510 Age-related nuclear cataract, unspecified eye: Secondary | ICD-10-CM | POA: Diagnosis not present

## 2011-11-28 DIAGNOSIS — H25019 Cortical age-related cataract, unspecified eye: Secondary | ICD-10-CM | POA: Diagnosis not present

## 2011-11-28 DIAGNOSIS — E119 Type 2 diabetes mellitus without complications: Secondary | ICD-10-CM | POA: Diagnosis not present

## 2011-12-04 DIAGNOSIS — M62838 Other muscle spasm: Secondary | ICD-10-CM | POA: Diagnosis not present

## 2011-12-04 DIAGNOSIS — N3010 Interstitial cystitis (chronic) without hematuria: Secondary | ICD-10-CM | POA: Diagnosis not present

## 2011-12-04 DIAGNOSIS — R279 Unspecified lack of coordination: Secondary | ICD-10-CM | POA: Diagnosis not present

## 2011-12-04 DIAGNOSIS — R3915 Urgency of urination: Secondary | ICD-10-CM | POA: Diagnosis not present

## 2011-12-18 DIAGNOSIS — R3915 Urgency of urination: Secondary | ICD-10-CM | POA: Diagnosis not present

## 2011-12-18 DIAGNOSIS — R279 Unspecified lack of coordination: Secondary | ICD-10-CM | POA: Diagnosis not present

## 2011-12-18 DIAGNOSIS — N3010 Interstitial cystitis (chronic) without hematuria: Secondary | ICD-10-CM | POA: Diagnosis not present

## 2011-12-18 DIAGNOSIS — M6281 Muscle weakness (generalized): Secondary | ICD-10-CM | POA: Diagnosis not present

## 2011-12-19 ENCOUNTER — Emergency Department (INDEPENDENT_AMBULATORY_CARE_PROVIDER_SITE_OTHER): Payer: Medicare Other

## 2011-12-19 ENCOUNTER — Emergency Department (HOSPITAL_BASED_OUTPATIENT_CLINIC_OR_DEPARTMENT_OTHER)
Admission: EM | Admit: 2011-12-19 | Discharge: 2011-12-19 | Disposition: A | Discharge status: Home / Self Care | Payer: Medicare Other | Attending: Emergency Medicine | Admitting: Emergency Medicine

## 2011-12-19 ENCOUNTER — Encounter (HOSPITAL_BASED_OUTPATIENT_CLINIC_OR_DEPARTMENT_OTHER): Payer: Self-pay | Admitting: *Deleted

## 2011-12-19 DIAGNOSIS — R109 Unspecified abdominal pain: Secondary | ICD-10-CM | POA: Insufficient documentation

## 2011-12-19 DIAGNOSIS — E119 Type 2 diabetes mellitus without complications: Secondary | ICD-10-CM | POA: Insufficient documentation

## 2011-12-19 DIAGNOSIS — K5732 Diverticulitis of large intestine without perforation or abscess without bleeding: Secondary | ICD-10-CM | POA: Diagnosis not present

## 2011-12-19 DIAGNOSIS — I10 Essential (primary) hypertension: Secondary | ICD-10-CM | POA: Diagnosis not present

## 2011-12-19 DIAGNOSIS — Z79899 Other long term (current) drug therapy: Secondary | ICD-10-CM | POA: Insufficient documentation

## 2011-12-19 DIAGNOSIS — I517 Cardiomegaly: Secondary | ICD-10-CM

## 2011-12-19 DIAGNOSIS — E7800 Pure hypercholesterolemia, unspecified: Secondary | ICD-10-CM | POA: Diagnosis not present

## 2011-12-19 DIAGNOSIS — R112 Nausea with vomiting, unspecified: Secondary | ICD-10-CM | POA: Diagnosis not present

## 2011-12-19 DIAGNOSIS — K5792 Diverticulitis of intestine, part unspecified, without perforation or abscess without bleeding: Secondary | ICD-10-CM

## 2011-12-19 DIAGNOSIS — K5712 Diverticulitis of small intestine without perforation or abscess without bleeding: Secondary | ICD-10-CM | POA: Diagnosis not present

## 2011-12-19 LAB — COMPREHENSIVE METABOLIC PANEL
ALT: 18 U/L (ref 0–35)
AST: 14 U/L (ref 0–37)
Albumin: 3.9 g/dL (ref 3.5–5.2)
Alkaline Phosphatase: 78 U/L (ref 39–117)
BUN: 9 mg/dL (ref 6–23)
CO2: 36 mEq/L — ABNORMAL HIGH (ref 19–32)
Calcium: 9.8 mg/dL (ref 8.4–10.5)
Chloride: 95 mEq/L — ABNORMAL LOW (ref 96–112)
Creatinine, Ser: 0.7 mg/dL (ref 0.50–1.10)
GFR calc Af Amer: >90 mL/min (ref >90)
GFR calc non Af Amer: >90 mL/min (ref >90)
Glucose, Bld: 122 mg/dL — ABNORMAL HIGH (ref 70–99)
Potassium: 2.8 mEq/L — ABNORMAL LOW (ref 3.5–5.1)
Sodium: 139 mEq/L (ref 135–145)
Total Bilirubin: 0.5 mg/dL (ref 0.3–1.2)
Total Protein: 8 g/dL (ref 6.0–8.3)

## 2011-12-19 LAB — URINALYSIS, ROUTINE W REFLEX MICROSCOPIC
Bilirubin Urine: NEGATIVE
Glucose, UA: NEGATIVE mg/dL
Hgb urine dipstick: NEGATIVE
Ketones, ur: NEGATIVE mg/dL
Leukocytes, UA: NEGATIVE
Nitrite: NEGATIVE
Protein, ur: NEGATIVE mg/dL
Specific Gravity, Urine: 1.026 (ref 1.005–1.030)
Urobilinogen, UA: 0.2 mg/dL (ref 0.0–1.0)
pH: 7 (ref 5.0–8.0)

## 2011-12-19 LAB — DIFFERENTIAL
Basophils Absolute: 0 10*3/uL (ref 0.0–0.1)
Basophils Relative: 0 % (ref 0–1)
Eosinophils Absolute: 0.1 10*3/uL (ref 0.0–0.7)
Eosinophils Relative: 1 % (ref 0–5)
Lymphocytes Relative: 19 % (ref 12–46)
Lymphs Abs: 1.9 10*3/uL (ref 0.7–4.0)
Monocytes Absolute: 1 10*3/uL (ref 0.1–1.0)
Monocytes Relative: 10 % (ref 3–12)
Neutro Abs: 7.2 10*3/uL (ref 1.7–7.7)
Neutrophils Relative %: 71 % (ref 43–77)

## 2011-12-19 LAB — CBC
HCT: 38.8 % (ref 36.0–46.0)
Hemoglobin: 13.8 g/dL (ref 12.0–15.0)
MCH: 29.5 pg (ref 26.0–34.0)
MCHC: 35.6 g/dL (ref 30.0–36.0)
MCV: 82.9 fL (ref 78.0–100.0)
Platelets: 184 10*3/uL (ref 150–400)
RBC: 4.68 MIL/uL (ref 3.87–5.11)
RDW: 13.6 % (ref 11.5–15.5)
WBC: 10.2 10*3/uL (ref 4.0–10.5)

## 2011-12-19 LAB — LIPASE, BLOOD: Lipase: 17 U/L (ref 11–59)

## 2011-12-19 MED ORDER — SODIUM CHLORIDE 0.9 % IV BOLUS (SEPSIS)
500.0000 mL | Freq: Once | INTRAVENOUS | Status: AC
  Administered 2011-12-19: 500 mL via INTRAVENOUS

## 2011-12-19 MED ORDER — POTASSIUM CHLORIDE CRYS ER 20 MEQ PO TBCR
40.0000 meq | EXTENDED_RELEASE_TABLET | Freq: Once | ORAL | Status: AC
  Administered 2011-12-19: 40 meq via ORAL
  Filled 2011-12-19: qty 2

## 2011-12-19 MED ORDER — MORPHINE SULFATE 4 MG/ML IJ SOLN
4.0000 mg | Freq: Once | INTRAMUSCULAR | Status: AC
  Administered 2011-12-19: 4 mg via INTRAVENOUS
  Filled 2011-12-19: qty 1

## 2011-12-19 MED ORDER — CIPROFLOXACIN HCL 500 MG PO TABS: 500.0000 mg | ORAL_TABLET | Freq: Two times a day (BID) | ORAL | Status: AC

## 2011-12-19 MED ORDER — IOHEXOL 300 MG/ML  SOLN
36.0000 mL | Freq: Once | INTRAMUSCULAR | Status: AC | PRN
  Administered 2011-12-19: 36 mL via ORAL

## 2011-12-19 MED ORDER — METRONIDAZOLE 500 MG PO TABS: 500.0000 mg | ORAL_TABLET | Freq: Two times a day (BID) | ORAL | Status: AC

## 2011-12-19 MED ORDER — ONDANSETRON HCL 4 MG/2ML IJ SOLN
4.0000 mg | Freq: Once | INTRAMUSCULAR | Status: AC
  Administered 2011-12-19: 4 mg via INTRAVENOUS
  Filled 2011-12-19: qty 2

## 2011-12-19 MED ORDER — CIPROFLOXACIN IN D5W 400 MG/200ML IV SOLN
400.0000 mg | Freq: Once | INTRAVENOUS | Status: AC
  Administered 2011-12-19: 400 mg via INTRAVENOUS
  Filled 2011-12-19: qty 200

## 2011-12-19 MED ORDER — OXYCODONE-ACETAMINOPHEN 5-325 MG PO TABS: 1.0000 | ORAL_TABLET | ORAL | Status: AC | PRN

## 2011-12-19 MED ORDER — ONDANSETRON HCL 4 MG PO TABS: 4.0000 mg | ORAL_TABLET | Freq: Four times a day (QID) | ORAL | Status: AC

## 2011-12-19 MED ORDER — METRONIDAZOLE IN NACL 5-0.79 MG/ML-% IV SOLN
500.0000 mg | Freq: Once | INTRAVENOUS | Status: AC
  Administered 2011-12-19: 500 mg via INTRAVENOUS
  Filled 2011-12-19: qty 100

## 2011-12-19 MED ORDER — IOHEXOL 300 MG/ML  SOLN
100.0000 mL | Freq: Once | INTRAMUSCULAR | Status: AC | PRN
  Administered 2011-12-19: 100 mL via INTRAVENOUS

## 2011-12-19 NOTE — Discharge Instructions (Signed)
Diverticulitis A diverticulum is a small pouch or sac on the colon. Diverticulosis is the presence of these diverticula on the colon. Diverticulitis is the irritation (inflammation) or infection of diverticula. CAUSES  The colon and its diverticula contain bacteria. If food particles block the tiny opening to a diverticulum, the bacteria inside can grow and cause an increase in pressure. This leads to infection and inflammation and is called diverticulitis. SYMPTOMS   Abdominal pain and tenderness. Usually, the pain is located on the left side of your abdomen. However, it could be located elsewhere.   Fever.   Bloating.   Feeling sick to your stomach (nausea).   Throwing up (vomiting).   Abnormal stools.  DIAGNOSIS  Your caregiver will take a history and perform a physical exam. Since many things can cause abdominal pain, other tests may be necessary. Tests may include:  Blood tests.   Urine tests.   X-ray of the abdomen.   CT scan of the abdomen.  Sometimes, surgery is needed to determine if diverticulitis or other conditions are causing your symptoms. TREATMENT  Most of the time, you can be treated without surgery. Treatment includes:  Resting the bowels by only having liquids for a few days. As you improve, you will need to eat a low-fiber diet.   Intravenous (IV) fluids if you are losing body fluids (dehydrated).   Antibiotic medicines that treat infections may be given.   Pain and nausea medicine, if needed.   Surgery if the inflamed diverticulum has burst.  HOME CARE INSTRUCTIONS   Try a clear liquid diet (broth, tea, or water for as long as directed by your caregiver). You may then gradually begin a low-fiber diet as tolerated. A low-fiber diet is a diet with less than 10 grams of fiber. Choose the foods below to reduce fiber in the diet:   White breads, cereals, rice, and pasta.   Cooked fruits and vegetables or soft fresh fruits and vegetables without the skin.     Ground or well-cooked tender beef, ham, veal, lamb, pork, or poultry.   Eggs and seafood.   After your diverticulitis symptoms have improved, your caregiver may put you on a high-fiber diet. A high-fiber diet includes 14 grams of fiber for every 1000 calories consumed. For a standard 2000 calorie diet, you would need 28 grams of fiber. Follow these diet guidelines to help you increase the fiber in your diet. It is important to slowly increase the amount fiber in your diet to avoid gas, constipation, and bloating.   Choose whole-grain breads, cereals, pasta, and brown rice.   Choose fresh fruits and vegetables with the skin on. Do not overcook vegetables because the more vegetables are cooked, the more fiber is lost.   Choose more nuts, seeds, legumes, dried peas, beans, and lentils.   Look for food products that have greater than 3 grams of fiber per serving on the Nutrition Facts label.   Take all medicine as directed by your caregiver.   If your caregiver has given you a follow-up appointment, it is very important that you go. Not going could result in lasting (chronic) or permanent injury, pain, and disability. If there is any problem keeping the appointment, call to reschedule.  SEEK MEDICAL CARE IF:   Your pain does not improve.   You have a hard time advancing your diet beyond clear liquids.   Your bowel movements do not return to normal.  SEEK IMMEDIATE MEDICAL CARE IF:   Your pain becomes   worse.   You have an oral temperature above 102 F (38.9 C), not controlled by medicine.   You have repeated vomiting.   You have bloody or black, tarry stools.   Symptoms that brought you to your caregiver become worse or are not getting better.  MAKE SURE YOU:   Understand these instructions.   Will watch your condition.   Will get help right away if you are not doing well or get worse.  Document Released: 05/03/2005 Document Revised: 07/13/2011 Document Reviewed:  08/29/2010 [employer] Patient Information 2012 [employer]. 

## 2011-12-19 NOTE — ED Provider Notes (Signed)
History     CSN: [id number]  Arrival date & time 12/19/11  1035   First MD Initiated Contact with Patient 12/19/11 1135      Chief Complaint  Patient presents with  . Abdominal Pain    (Consider location/radiation/quality/duration/timing/severity/associated sxs/prior treatment) HPI Pt c/o 1 day of [place] abdominal pain starting last night. Not associated with food. No fever, chills, N/V/D/C. Normal BM this am. States she had bladder testing yesterday and then laid down. She woke with the pain worse with any movement Past Medical History  Diagnosis Date  . Diabetes mellitus   . Hypertension   . Hypercholesteremia     History reviewed. No pertinent past surgical history.  History reviewed. No pertinent family history.  History  Substance Use Topics  . Smoking status: Never Smoker   . Smokeless tobacco: Not on file  . Alcohol Use: No    OB History    Grav Para Term Preterm Abortions TAB SAB Ect Mult Living                  Review of Systems  Constitutional: Negative for fever and chills.  Respiratory: Negative for cough and shortness of breath.   Cardiovascular: Negative for chest pain.  Gastrointestinal: Positive for abdominal pain. Negative for nausea, vomiting, diarrhea, constipation and abdominal distention.  Genitourinary: Negative for dysuria, hematuria, flank pain, vaginal bleeding and vaginal discharge.  Musculoskeletal: Negative for myalgias and back pain.  Skin: Negative for rash and wound.  Neurological: Negative for weakness and numbness.    Allergies  Aspirin and Latex  Home Medications   Current Outpatient Rx  Name Route Sig Dispense Refill  . ALBUTEROL SULFATE HFA 108 (90 BASE) MCG/ACT IN AERS Inhalation Inhale 2 puffs into the lungs every 6 (six) hours as needed. For shortness of breath    . ATENOLOL 50 MG PO TABS Oral Take 50 mg by mouth daily.    Marland Kitchen CIPROFLOXACIN HCL 500 MG PO TABS Oral Take 1 tablet (500 mg total) by mouth every 12 (twelve)  hours. 20 tablet 0  . HYDROCODONE-ACETAMINOPHEN 5-400 MG PO TABS Oral Take 1 tablet by mouth every 6 (six) hours as needed. For pain    . METFORMIN HCL 500 MG PO TABS Oral Take 500 mg by mouth 2 (two) times daily with a meal.    . METRONIDAZOLE 500 MG PO TABS Oral Take 1 tablet (500 mg total) by mouth 2 (two) times daily. 20 tablet 0  . ONDANSETRON HCL 4 MG PO TABS Oral Take 1 tablet (4 mg total) by mouth every 6 (six) hours. 12 tablet 0  . OXYCODONE-ACETAMINOPHEN 5-325 MG PO TABS Oral Take 1 tablet by mouth every 4 (four) hours as needed for pain. 15 tablet 0  . POTASSIUM CHLORIDE CRYS ER 15 MEQ PO TBCR Oral Take 15 mEq by mouth 2 (two) times daily.    Marland Kitchen SIMVASTATIN 40 MG PO TABS Oral Take 40 mg by mouth every evening.      BP 120/79  Pulse 87  Temp(Src) 98.6 F (37 C) (Oral)  Resp 16  Ht 5\' 5"  (1.651 m)  Wt 182 lb (82.555 kg)  BMI 30.29 kg/m2  SpO2 99%  Physical Exam  Nursing note and vitals reviewed. Constitutional: She is oriented to person, place, and time. She appears well-developed and well-nourished. No distress.  HENT:  Head: Normocephalic and atraumatic.  Mouth/Throat: Oropharynx is clear and moist.  Eyes: EOM are normal. Pupils are equal, round, and reactive to light.  Neck: Normal range of motion. Neck supple.  Cardiovascular: Normal rate and regular rhythm.   Pulmonary/Chest: Effort normal and breath sounds normal. No respiratory distress. She has no wheezes. She has no rales.  Abdominal: Soft. Bowel sounds are normal. She exhibits no distension and no mass. There is tenderness (Pt with TTP LUQ/LLQ even to very light palpation. No peritoneal signs. No obvious trauma). There is no rebound and no guarding.  Musculoskeletal: Normal range of motion. She exhibits no edema and no tenderness.  Neurological: She is alert and oriented to person, place, and time.  Skin: Skin is warm and dry. No rash noted. No erythema.  Psychiatric: She has a normal mood and affect. Her behavior  is normal.    ED Course  Procedures (including critical care time)  Labs Reviewed  COMPREHENSIVE METABOLIC PANEL - Abnormal; Notable for the following:    Potassium 2.8 (*)    Chloride 95 (*)    CO2 36 (*)    Glucose, Bld 122 (*)    All other components within normal limits  CBC  DIFFERENTIAL  LIPASE, BLOOD  URINALYSIS, ROUTINE W REFLEX MICROSCOPIC   Ct Abdomen Pelvis W Contrast  12/19/2011  *RADIOLOGY REPORT*  Clinical Data: Left-sided abdominal pain  CT ABDOMEN AND PELVIS WITH CONTRAST  Technique:  Multidetector CT imaging of the abdomen and pelvis was performed following the standard protocol during bolus administration of intravenous contrast.  Contrast: OMNIPAQUE IOHEXOL 300 MG/ML  SOLN  Comparison: 01/01/2011  Findings: Curvilinear right lower lobe and lingular probable atelectasis noted.  Hepatic hypodensity is most compatible with hepatic steatosis. Kidneys, adrenal glands, spleen, and pancreas are normal. Cholecystectomy clips noted. The stomach and duodenum are normal.  There is short segmental distal transverse colonic wall thickening and surrounding stranding, for example image 52.  No surrounding fluid collection to suggest abscess.  No free air.  Appendix not visualized but no secondary evidence for acute appendicitis is identified.  Small bowel is unremarkable. The bladder is normal.  No pelvic free fluid or lymphadenopathy.  No acute osseous abnormality.  Uterus is surgically absent.  Ovaries are unremarkable.  Trace free pelvic fluid is incidentally noted.  IMPRESSION: Short segmental distal transverse colon wall thickening with surrounding stranding, likely indicating diverticulitis.  No complicating features such as free air or abscess formation. Consider colonoscopy after symptoms have resolved to ensure no underlying mass lesion is present which could mimic this appearance.  Original Report Authenticated By: Harrel Lemon, M.D.   Dg Abd Acute W/chest  12/19/2011   *RADIOLOGY REPORT*  Clinical Data: Severe left abdomen pain  ACUTE ABDOMEN SERIES (ABDOMEN 2 VIEW & CHEST 1 VIEW)  Comparison: CT abdomen of 01/01/2011 and chest x-ray of 10/04/1998.  Findings: The lungs are clear.  Mediastinal contours appear stable. The heart is mildly enlarged.  Supine and erect views of the abdomen show no bowel obstruction. No free air is seen.  No opaque calculi are noted and no calculi were seen on the prior CT.  Surgical clips are present in the right upper quadrant from prior cholecystectomy.  There were multiple diverticula noted throughout the colon on the prior CT of the abdomen.  Is there any clinical suspicion of diverticulitis?  IMPRESSION:  1.  Mild cardiomegaly.  No active lung disease. 2.  No bowel obstruction.  No free air. 3.  There were multiple colonic diverticula diffusely on the prior CT. Is there any clinical suspicion of diverticulitis ?  Original Report Authenticated By: Colon Flattery.  Gery Pray, M.D.     1. Diverticulitis       MDM   Pt pain is improved. Ambulating in halls. VS stable. Will treat with outpt abx and have f/u with GI. Return for worsening pain, fever or any concerns       Loren Racer, MD 12/19/11 [phone]

## 2011-12-19 NOTE — ED Notes (Signed)
Pt amb to room 4 with quick steady gait in nad.  Pt reports llq pain x yesterday after having her bladder therapy. Pt denies any n/v/d, states last bm this am, normal.

## 2011-12-20 ENCOUNTER — Emergency Department (HOSPITAL_BASED_OUTPATIENT_CLINIC_OR_DEPARTMENT_OTHER): Payer: Medicare Other

## 2011-12-20 ENCOUNTER — Emergency Department (HOSPITAL_BASED_OUTPATIENT_CLINIC_OR_DEPARTMENT_OTHER)
Admission: EM | Admit: 2011-12-20 | Discharge: 2011-12-20 | Disposition: A | Discharge status: Home / Self Care | Payer: Medicare Other | Attending: Emergency Medicine | Admitting: Emergency Medicine

## 2011-12-20 ENCOUNTER — Encounter (HOSPITAL_BASED_OUTPATIENT_CLINIC_OR_DEPARTMENT_OTHER): Payer: Self-pay

## 2011-12-20 DIAGNOSIS — E119 Type 2 diabetes mellitus without complications: Secondary | ICD-10-CM | POA: Diagnosis not present

## 2011-12-20 DIAGNOSIS — I10 Essential (primary) hypertension: Secondary | ICD-10-CM | POA: Diagnosis not present

## 2011-12-20 DIAGNOSIS — K5732 Diverticulitis of large intestine without perforation or abscess without bleeding: Secondary | ICD-10-CM | POA: Diagnosis not present

## 2011-12-20 DIAGNOSIS — R10814 Left lower quadrant abdominal tenderness: Secondary | ICD-10-CM | POA: Insufficient documentation

## 2011-12-20 DIAGNOSIS — Z79899 Other long term (current) drug therapy: Secondary | ICD-10-CM | POA: Diagnosis not present

## 2011-12-20 DIAGNOSIS — R112 Nausea with vomiting, unspecified: Secondary | ICD-10-CM | POA: Diagnosis not present

## 2011-12-20 DIAGNOSIS — R109 Unspecified abdominal pain: Secondary | ICD-10-CM | POA: Diagnosis not present

## 2011-12-20 DIAGNOSIS — K5792 Diverticulitis of intestine, part unspecified, without perforation or abscess without bleeding: Secondary | ICD-10-CM

## 2011-12-20 LAB — LACTIC ACID, PLASMA: Lactic Acid, Venous: 1.2 mmol/L (ref 0.5–2.2)

## 2011-12-20 MED ORDER — ONDANSETRON HCL 4 MG/2ML IJ SOLN
4.0000 mg | Freq: Once | INTRAMUSCULAR | Status: AC
  Administered 2011-12-20: 4 mg via INTRAVENOUS

## 2011-12-20 MED ORDER — METOCLOPRAMIDE HCL 5 MG/ML IJ SOLN
INTRAMUSCULAR | Status: AC
  Administered 2011-12-20: 10 mg
  Filled 2011-12-20: qty 2

## 2011-12-20 MED ORDER — MORPHINE SULFATE 4 MG/ML IJ SOLN
4.0000 mg | Freq: Once | INTRAMUSCULAR | Status: AC
  Administered 2011-12-20: 4 mg via INTRAVENOUS
  Filled 2011-12-20: qty 1

## 2011-12-20 MED ORDER — HYDROMORPHONE HCL PF 1 MG/ML IJ SOLN
1.0000 mg | Freq: Once | INTRAMUSCULAR | Status: AC
  Administered 2011-12-20: 1 mg via INTRAVENOUS
  Filled 2011-12-20: qty 1

## 2011-12-20 MED ORDER — IOHEXOL 300 MG/ML  SOLN
100.0000 mL | Freq: Once | INTRAMUSCULAR | Status: AC | PRN
  Administered 2011-12-20: 100 mL via INTRAVENOUS

## 2011-12-20 MED ORDER — IOHEXOL 300 MG/ML  SOLN
20.0000 mL | INTRAMUSCULAR | Status: AC
  Administered 2011-12-20: 20 mL via ORAL

## 2011-12-20 MED ORDER — SODIUM CHLORIDE 0.9 % IV BOLUS (SEPSIS)
1000.0000 mL | Freq: Once | INTRAVENOUS | Status: AC
  Administered 2011-12-20: 1000 mL via INTRAVENOUS

## 2011-12-20 MED ORDER — ONDANSETRON HCL 4 MG/2ML IJ SOLN
INTRAMUSCULAR | Status: AC
  Filled 2011-12-20: qty 2

## 2011-12-20 MED ORDER — ONDANSETRON HCL 4 MG/2ML IJ SOLN
4.0000 mg | Freq: Once | INTRAMUSCULAR | Status: AC
  Administered 2011-12-20: 4 mg via INTRAVENOUS
  Filled 2011-12-20: qty 2

## 2011-12-20 MED ORDER — SODIUM CHLORIDE 0.9 % IV BOLUS (SEPSIS)
500.0000 mL | Freq: Once | INTRAVENOUS | Status: AC
  Administered 2011-12-20: 1000 mL via INTRAVENOUS

## 2011-12-20 NOTE — ED Notes (Signed)
Pt reports abdominal pain and diarrhea.  She was seen here yesterday, taking medications but not feeling any better.

## 2011-12-20 NOTE — Discharge Instructions (Signed)
Diverticulitis Small pockets or "bubbles" can develop in the wall of the intestine. Diverticulitis is when those pockets become infected and inflamed. This causes stomach pain (usually on the left side). HOME CARE  Take all medicine as told by your doctor.   Try a clear liquid diet (broth, tea, or water) for as long as told by your doctor.   Keep all follow-up visits with your doctor.   You may be put on a low-fiber diet once you start feeling better. Here are foods that have low-fiber:   White breads, cereals, rice, and pasta.   Cooked fruits and vegetables or soft fresh fruits and vegetables without the skin.   Ground or well-cooked tender beef, ham, veal, lamb, pork, or poultry.   Eggs and seafood.   After you are doing well on the low-fiber diet, you may be put on a high-fiber diet. Here are ways to increase your fiber:   Choose whole-grain breads, cereals, pasta, and brown rice.   Choose fruits and vegetables with skin on. Do not overcook the vegetables.   Choose nuts, seeds, legumes, dried peas, beans, and lentils.   Look for food products that have more than 3 grams of fiber per serving on the food label.  GET HELP RIGHT AWAY IF:  Your pain does not get better or gets worse.   You have trouble eating food.   You are not pooping (having bowel movements) like normal.   You have a temperature by mouth above 102 F (38.9 C), not controlled by medicine.   You keep throwing up (vomiting).   You have bloody or black, tarry poop (stools).   You are getting worse and not better.  MAKE SURE YOU:   Understand these instructions.   Will watch your condition.   Will get help right away if you are not doing well or get worse.  Document Released: 01/10/2008 Document Revised: 07/13/2011 Document Reviewed: 06/14/2009 [employer] Patient Information 2012 [employer]. 

## 2011-12-20 NOTE — ED Provider Notes (Signed)
History     CSN: [id number]  Arrival date & time 12/20/11  1126   First MD Initiated Contact with Patient 12/20/11 1159      Chief Complaint  Patient presents with  . Abdominal Pain  . Diarrhea    (Consider location/radiation/quality/duration/timing/severity/associated sxs/prior treatment) HPI Comments: Pt states that she was seen yesterday and diagnosed with diverticulitis and she is here because sh isn't feeling any better  Patient is a 56 y.o. female presenting with abdominal pain. The history is provided by the patient. No language interpreter was used.  Abdominal Pain The primary symptoms of the illness include abdominal pain and diarrhea. The primary symptoms of the illness do not include fever or vomiting. The current episode started more than 2 days ago. The onset of the illness was gradual. The problem has not changed since onset.   Past Medical History  Diagnosis Date  . Diabetes mellitus   . Hypertension   . Hypercholesteremia     History reviewed. No pertinent past surgical history.  No family history on file.  History  Substance Use Topics  . Smoking status: Never Smoker   . Smokeless tobacco: Not on file  . Alcohol Use: No    OB History    Grav Para Term Preterm Abortions TAB SAB Ect Mult Living                  Review of Systems  Constitutional: Negative for fever.  Respiratory: Negative.   Cardiovascular: Negative.   Gastrointestinal: Positive for abdominal pain and diarrhea. Negative for vomiting.    Allergies  Aspirin and Latex  Home Medications   Current Outpatient Rx  Name Route Sig Dispense Refill  . ALBUTEROL SULFATE HFA 108 (90 BASE) MCG/ACT IN AERS Inhalation Inhale 2 puffs into the lungs every 6 (six) hours as needed. For shortness of breath    . ATENOLOL 50 MG PO TABS Oral Take 50 mg by mouth daily.    Marland Kitchen CIPROFLOXACIN HCL 500 MG PO TABS Oral Take 1 tablet (500 mg total) by mouth every 12 (twelve) hours. 20 tablet 0  .  HYDROCODONE-ACETAMINOPHEN 5-400 MG PO TABS Oral Take 1 tablet by mouth every 6 (six) hours as needed. For pain    . METFORMIN HCL 500 MG PO TABS Oral Take 500 mg by mouth 2 (two) times daily with a meal.    . METRONIDAZOLE 500 MG PO TABS Oral Take 1 tablet (500 mg total) by mouth 2 (two) times daily. 20 tablet 0  . ONDANSETRON HCL 4 MG PO TABS Oral Take 1 tablet (4 mg total) by mouth every 6 (six) hours. 12 tablet 0  . OXYCODONE-ACETAMINOPHEN 5-325 MG PO TABS Oral Take 1 tablet by mouth every 4 (four) hours as needed for pain. 15 tablet 0  . POTASSIUM CHLORIDE CRYS ER 15 MEQ PO TBCR Oral Take 15 mEq by mouth 2 (two) times daily.    Marland Kitchen SIMVASTATIN 40 MG PO TABS Oral Take 40 mg by mouth every evening.      BP 111/74  Pulse 77  Temp(Src) 98.2 F (36.8 C) (Oral)  Resp 16  Ht 5\' 5"  (1.651 m)  Wt 182 lb (82.555 kg)  BMI 30.29 kg/m2  SpO2 96%  Physical Exam  Nursing note and vitals reviewed. Constitutional: She appears well-developed.  HENT:  Head: Normocephalic and atraumatic.  Eyes: Conjunctivae and EOM are normal.  Cardiovascular: Normal rate and regular rhythm.   Pulmonary/Chest: Effort normal and breath sounds normal.  Abdominal:  Soft. Bowel sounds are normal. There is tenderness in the left lower quadrant. There is no rebound and no guarding.  Musculoskeletal: Normal range of motion.  Neurological: She is alert.  Skin: Skin is warm and dry.    ED Course  Procedures (including critical care time)   Labs Reviewed  LACTIC ACID, PLASMA      1. Diverticulitis       MDM  No finding of perforation or abscess:pt is more comfortable at this time:discussed increased pain medication dose with pt        Teressa Lower, NP 12/20/11 1741

## 2011-12-21 NOTE — ED Provider Notes (Signed)
Medical screening examination/treatment/procedure(s) were performed by non-physician practitioner and as supervising physician I was immediately available for consultation/collaboration.   Gwyneth Sprout, MD 12/21/11 [phone]

## 2011-12-24 NOTE — ED Notes (Signed)
Pt called to request "something for a yeast infection"- sts she forgot to mention that antibiotics usually give her yeast infections.  Per Dr. Ranae Palms, pt will need to f/u with her PMD- left msg for pt stating same

## 2011-12-27 DIAGNOSIS — K5732 Diverticulitis of large intestine without perforation or abscess without bleeding: Secondary | ICD-10-CM | POA: Diagnosis not present

## 2011-12-27 DIAGNOSIS — R1012 Left upper quadrant pain: Secondary | ICD-10-CM | POA: Diagnosis not present

## 2011-12-28 DIAGNOSIS — R51 Headache: Secondary | ICD-10-CM | POA: Diagnosis not present

## 2011-12-28 DIAGNOSIS — K219 Gastro-esophageal reflux disease without esophagitis: Secondary | ICD-10-CM | POA: Diagnosis not present

## 2011-12-28 DIAGNOSIS — R059 Cough, unspecified: Secondary | ICD-10-CM | POA: Diagnosis not present

## 2011-12-28 DIAGNOSIS — E785 Hyperlipidemia, unspecified: Secondary | ICD-10-CM | POA: Diagnosis not present

## 2011-12-28 DIAGNOSIS — F3289 Other specified depressive episodes: Secondary | ICD-10-CM | POA: Diagnosis not present

## 2011-12-28 DIAGNOSIS — E876 Hypokalemia: Secondary | ICD-10-CM | POA: Diagnosis not present

## 2011-12-28 DIAGNOSIS — M549 Dorsalgia, unspecified: Secondary | ICD-10-CM | POA: Diagnosis not present

## 2011-12-28 DIAGNOSIS — R05 Cough: Secondary | ICD-10-CM | POA: Diagnosis not present

## 2011-12-28 DIAGNOSIS — N390 Urinary tract infection, site not specified: Secondary | ICD-10-CM | POA: Diagnosis not present

## 2011-12-28 DIAGNOSIS — J329 Chronic sinusitis, unspecified: Secondary | ICD-10-CM | POA: Diagnosis not present

## 2011-12-28 DIAGNOSIS — E119 Type 2 diabetes mellitus without complications: Secondary | ICD-10-CM | POA: Diagnosis not present

## 2011-12-28 DIAGNOSIS — F329 Major depressive disorder, single episode, unspecified: Secondary | ICD-10-CM | POA: Diagnosis not present

## 2011-12-28 DIAGNOSIS — I10 Essential (primary) hypertension: Secondary | ICD-10-CM | POA: Diagnosis not present

## 2012-01-11 DIAGNOSIS — F329 Major depressive disorder, single episode, unspecified: Secondary | ICD-10-CM | POA: Diagnosis not present

## 2012-01-11 DIAGNOSIS — R059 Cough, unspecified: Secondary | ICD-10-CM | POA: Diagnosis not present

## 2012-01-11 DIAGNOSIS — E119 Type 2 diabetes mellitus without complications: Secondary | ICD-10-CM | POA: Diagnosis not present

## 2012-01-11 DIAGNOSIS — R05 Cough: Secondary | ICD-10-CM | POA: Diagnosis not present

## 2012-01-11 DIAGNOSIS — K219 Gastro-esophageal reflux disease without esophagitis: Secondary | ICD-10-CM | POA: Diagnosis not present

## 2012-01-11 DIAGNOSIS — F3289 Other specified depressive episodes: Secondary | ICD-10-CM | POA: Diagnosis not present

## 2012-01-11 DIAGNOSIS — E876 Hypokalemia: Secondary | ICD-10-CM | POA: Diagnosis not present

## 2012-01-11 DIAGNOSIS — I10 Essential (primary) hypertension: Secondary | ICD-10-CM | POA: Diagnosis not present

## 2012-01-11 DIAGNOSIS — R51 Headache: Secondary | ICD-10-CM | POA: Diagnosis not present

## 2012-01-11 DIAGNOSIS — E785 Hyperlipidemia, unspecified: Secondary | ICD-10-CM | POA: Diagnosis not present

## 2012-01-16 DIAGNOSIS — J309 Allergic rhinitis, unspecified: Secondary | ICD-10-CM | POA: Diagnosis not present

## 2012-01-29 DIAGNOSIS — R142 Eructation: Secondary | ICD-10-CM | POA: Diagnosis not present

## 2012-01-29 DIAGNOSIS — K5732 Diverticulitis of large intestine without perforation or abscess without bleeding: Secondary | ICD-10-CM | POA: Diagnosis not present

## 2012-01-29 DIAGNOSIS — R197 Diarrhea, unspecified: Secondary | ICD-10-CM | POA: Diagnosis not present

## 2012-01-29 DIAGNOSIS — R141 Gas pain: Secondary | ICD-10-CM | POA: Diagnosis not present

## 2012-01-30 DIAGNOSIS — F329 Major depressive disorder, single episode, unspecified: Secondary | ICD-10-CM | POA: Diagnosis not present

## 2012-01-30 DIAGNOSIS — F3289 Other specified depressive episodes: Secondary | ICD-10-CM | POA: Diagnosis not present

## 2012-01-30 DIAGNOSIS — I10 Essential (primary) hypertension: Secondary | ICD-10-CM | POA: Diagnosis not present

## 2012-01-30 DIAGNOSIS — E119 Type 2 diabetes mellitus without complications: Secondary | ICD-10-CM | POA: Diagnosis not present

## 2012-01-30 DIAGNOSIS — E785 Hyperlipidemia, unspecified: Secondary | ICD-10-CM | POA: Diagnosis not present

## 2012-01-30 DIAGNOSIS — K219 Gastro-esophageal reflux disease without esophagitis: Secondary | ICD-10-CM | POA: Diagnosis not present

## 2012-01-30 DIAGNOSIS — E876 Hypokalemia: Secondary | ICD-10-CM | POA: Diagnosis not present

## 2012-01-30 DIAGNOSIS — R51 Headache: Secondary | ICD-10-CM | POA: Diagnosis not present

## 2012-02-13 DIAGNOSIS — N898 Other specified noninflammatory disorders of vagina: Secondary | ICD-10-CM | POA: Diagnosis not present

## 2012-02-26 DIAGNOSIS — R197 Diarrhea, unspecified: Secondary | ICD-10-CM | POA: Diagnosis not present

## 2012-02-26 DIAGNOSIS — R1084 Generalized abdominal pain: Secondary | ICD-10-CM | POA: Diagnosis not present

## 2012-03-12 DIAGNOSIS — I10 Essential (primary) hypertension: Secondary | ICD-10-CM | POA: Diagnosis not present

## 2012-03-12 DIAGNOSIS — R51 Headache: Secondary | ICD-10-CM | POA: Diagnosis not present

## 2012-03-12 DIAGNOSIS — F3289 Other specified depressive episodes: Secondary | ICD-10-CM | POA: Diagnosis not present

## 2012-03-12 DIAGNOSIS — E876 Hypokalemia: Secondary | ICD-10-CM | POA: Diagnosis not present

## 2012-03-12 DIAGNOSIS — F329 Major depressive disorder, single episode, unspecified: Secondary | ICD-10-CM | POA: Diagnosis not present

## 2012-03-12 DIAGNOSIS — K219 Gastro-esophageal reflux disease without esophagitis: Secondary | ICD-10-CM | POA: Diagnosis not present

## 2012-03-12 DIAGNOSIS — E785 Hyperlipidemia, unspecified: Secondary | ICD-10-CM | POA: Diagnosis not present

## 2012-03-12 DIAGNOSIS — R197 Diarrhea, unspecified: Secondary | ICD-10-CM | POA: Diagnosis not present

## 2012-03-12 DIAGNOSIS — E119 Type 2 diabetes mellitus without complications: Secondary | ICD-10-CM | POA: Diagnosis not present

## 2012-05-02 DIAGNOSIS — F3289 Other specified depressive episodes: Secondary | ICD-10-CM | POA: Diagnosis not present

## 2012-05-02 DIAGNOSIS — I10 Essential (primary) hypertension: Secondary | ICD-10-CM | POA: Diagnosis not present

## 2012-05-02 DIAGNOSIS — K219 Gastro-esophageal reflux disease without esophagitis: Secondary | ICD-10-CM | POA: Diagnosis not present

## 2012-05-02 DIAGNOSIS — E119 Type 2 diabetes mellitus without complications: Secondary | ICD-10-CM | POA: Diagnosis not present

## 2012-05-02 DIAGNOSIS — F329 Major depressive disorder, single episode, unspecified: Secondary | ICD-10-CM | POA: Diagnosis not present

## 2012-05-02 DIAGNOSIS — J329 Chronic sinusitis, unspecified: Secondary | ICD-10-CM | POA: Diagnosis not present

## 2012-05-02 DIAGNOSIS — E785 Hyperlipidemia, unspecified: Secondary | ICD-10-CM | POA: Diagnosis not present

## 2012-05-02 DIAGNOSIS — R51 Headache: Secondary | ICD-10-CM | POA: Diagnosis not present

## 2012-05-20 DIAGNOSIS — J329 Chronic sinusitis, unspecified: Secondary | ICD-10-CM | POA: Diagnosis not present

## 2012-05-20 DIAGNOSIS — E785 Hyperlipidemia, unspecified: Secondary | ICD-10-CM | POA: Diagnosis not present

## 2012-05-20 DIAGNOSIS — F329 Major depressive disorder, single episode, unspecified: Secondary | ICD-10-CM | POA: Diagnosis not present

## 2012-05-20 DIAGNOSIS — I10 Essential (primary) hypertension: Secondary | ICD-10-CM | POA: Diagnosis not present

## 2012-05-20 DIAGNOSIS — K219 Gastro-esophageal reflux disease without esophagitis: Secondary | ICD-10-CM | POA: Diagnosis not present

## 2012-05-20 DIAGNOSIS — E119 Type 2 diabetes mellitus without complications: Secondary | ICD-10-CM | POA: Diagnosis not present

## 2012-05-20 DIAGNOSIS — F3289 Other specified depressive episodes: Secondary | ICD-10-CM | POA: Diagnosis not present

## 2012-08-04 ENCOUNTER — Emergency Department (HOSPITAL_BASED_OUTPATIENT_CLINIC_OR_DEPARTMENT_OTHER)
Admission: EM | Admit: 2012-08-04 | Discharge: 2012-08-04 | Disposition: A | Discharge status: Home / Self Care | Payer: Medicare Other | Attending: Emergency Medicine | Admitting: Emergency Medicine

## 2012-08-04 ENCOUNTER — Encounter (HOSPITAL_BASED_OUTPATIENT_CLINIC_OR_DEPARTMENT_OTHER): Payer: Self-pay | Admitting: *Deleted

## 2012-08-04 DIAGNOSIS — Z79899 Other long term (current) drug therapy: Secondary | ICD-10-CM | POA: Insufficient documentation

## 2012-08-04 DIAGNOSIS — Z8709 Personal history of other diseases of the respiratory system: Secondary | ICD-10-CM | POA: Diagnosis not present

## 2012-08-04 DIAGNOSIS — R509 Fever, unspecified: Secondary | ICD-10-CM

## 2012-08-04 DIAGNOSIS — R059 Cough, unspecified: Secondary | ICD-10-CM | POA: Diagnosis not present

## 2012-08-04 DIAGNOSIS — E119 Type 2 diabetes mellitus without complications: Secondary | ICD-10-CM | POA: Diagnosis not present

## 2012-08-04 DIAGNOSIS — R05 Cough: Secondary | ICD-10-CM

## 2012-08-04 DIAGNOSIS — E7800 Pure hypercholesterolemia, unspecified: Secondary | ICD-10-CM | POA: Diagnosis not present

## 2012-08-04 DIAGNOSIS — J45909 Unspecified asthma, uncomplicated: Secondary | ICD-10-CM | POA: Insufficient documentation

## 2012-08-04 DIAGNOSIS — Z791 Long term (current) use of non-steroidal anti-inflammatories (NSAID): Secondary | ICD-10-CM | POA: Diagnosis not present

## 2012-08-04 DIAGNOSIS — I10 Essential (primary) hypertension: Secondary | ICD-10-CM | POA: Diagnosis not present

## 2012-08-04 LAB — GLUCOSE, CAPILLARY: Glucose-Capillary: 133 mg/dL — ABNORMAL HIGH (ref 70–99)

## 2012-08-04 MED ORDER — AZITHROMYCIN 250 MG PO TABS: 250.0000 mg | ORAL_TABLET | Freq: Every day | ORAL | Status: DC

## 2012-08-04 MED ORDER — ALBUTEROL SULFATE HFA 108 (90 BASE) MCG/ACT IN AERS
2.0000 | INHALATION_SPRAY | RESPIRATORY_TRACT | Status: DC | PRN
  Administered 2012-08-04: 2 via RESPIRATORY_TRACT
  Filled 2012-08-04: qty 6.7

## 2012-08-04 MED ORDER — MECLIZINE HCL 25 MG PO TABS: 25.0000 mg | ORAL_TABLET | Freq: Once | ORAL | Status: DC
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
## 2012-08-04 NOTE — Patient Instructions (Signed)
Instructed pt on the proper use of administering albuteral mdi via aerochamber pt tolerated well 

## 2012-08-04 NOTE — ED Notes (Signed)
Cough, congestion, fever since Thursday. Glands swollen.

## 2012-08-06 NOTE — ED Provider Notes (Signed)
Medical screening examination/treatment/procedure(s) were performed by non-physician practitioner and as supervising physician I was immediately available for consultation/collaboration.  Geoffery Lyons, MD 08/06/12 [phone]

## 2012-08-09 DIAGNOSIS — I10 Essential (primary) hypertension: Secondary | ICD-10-CM | POA: Diagnosis not present

## 2012-08-09 DIAGNOSIS — E119 Type 2 diabetes mellitus without complications: Secondary | ICD-10-CM | POA: Diagnosis not present

## 2012-08-09 DIAGNOSIS — F3289 Other specified depressive episodes: Secondary | ICD-10-CM | POA: Diagnosis not present

## 2012-08-09 DIAGNOSIS — K219 Gastro-esophageal reflux disease without esophagitis: Secondary | ICD-10-CM | POA: Diagnosis not present

## 2012-08-09 DIAGNOSIS — J029 Acute pharyngitis, unspecified: Secondary | ICD-10-CM | POA: Diagnosis not present

## 2012-08-09 DIAGNOSIS — F329 Major depressive disorder, single episode, unspecified: Secondary | ICD-10-CM | POA: Diagnosis not present

## 2012-08-09 DIAGNOSIS — E785 Hyperlipidemia, unspecified: Secondary | ICD-10-CM | POA: Diagnosis not present

## 2012-08-09 DIAGNOSIS — I889 Nonspecific lymphadenitis, unspecified: Secondary | ICD-10-CM | POA: Diagnosis not present

## 2012-08-13 DIAGNOSIS — E785 Hyperlipidemia, unspecified: Secondary | ICD-10-CM | POA: Diagnosis not present

## 2012-08-13 DIAGNOSIS — F329 Major depressive disorder, single episode, unspecified: Secondary | ICD-10-CM | POA: Diagnosis not present

## 2012-08-13 DIAGNOSIS — I10 Essential (primary) hypertension: Secondary | ICD-10-CM | POA: Diagnosis not present

## 2012-08-13 DIAGNOSIS — E559 Vitamin D deficiency, unspecified: Secondary | ICD-10-CM | POA: Diagnosis not present

## 2012-08-13 DIAGNOSIS — K219 Gastro-esophageal reflux disease without esophagitis: Secondary | ICD-10-CM | POA: Diagnosis not present

## 2012-08-13 DIAGNOSIS — F3289 Other specified depressive episodes: Secondary | ICD-10-CM | POA: Diagnosis not present

## 2012-08-13 DIAGNOSIS — E119 Type 2 diabetes mellitus without complications: Secondary | ICD-10-CM | POA: Diagnosis not present

## 2012-08-22 DIAGNOSIS — L210 Seborrhea capitis: Secondary | ICD-10-CM | POA: Diagnosis not present

## 2012-08-22 DIAGNOSIS — E119 Type 2 diabetes mellitus without complications: Secondary | ICD-10-CM | POA: Diagnosis not present

## 2012-08-26 ENCOUNTER — Emergency Department (HOSPITAL_BASED_OUTPATIENT_CLINIC_OR_DEPARTMENT_OTHER)
Admission: EM | Admit: 2012-08-26 | Discharge: 2012-08-26 | Disposition: A | Discharge status: Home / Self Care | Payer: Medicare Other | Attending: Emergency Medicine | Admitting: Emergency Medicine

## 2012-08-26 ENCOUNTER — Encounter (HOSPITAL_BASED_OUTPATIENT_CLINIC_OR_DEPARTMENT_OTHER): Payer: Self-pay | Admitting: *Deleted

## 2012-08-26 DIAGNOSIS — M546 Pain in thoracic spine: Secondary | ICD-10-CM | POA: Diagnosis not present

## 2012-08-26 DIAGNOSIS — M7918 Myalgia, other site: Secondary | ICD-10-CM

## 2012-08-26 DIAGNOSIS — M549 Dorsalgia, unspecified: Secondary | ICD-10-CM

## 2012-08-26 DIAGNOSIS — I10 Essential (primary) hypertension: Secondary | ICD-10-CM | POA: Insufficient documentation

## 2012-08-26 DIAGNOSIS — IMO0001 Reserved for inherently not codable concepts without codable children: Secondary | ICD-10-CM | POA: Diagnosis not present

## 2012-08-26 DIAGNOSIS — E7800 Pure hypercholesterolemia, unspecified: Secondary | ICD-10-CM | POA: Diagnosis not present

## 2012-08-26 DIAGNOSIS — E119 Type 2 diabetes mellitus without complications: Secondary | ICD-10-CM | POA: Insufficient documentation

## 2012-08-26 DIAGNOSIS — Z79899 Other long term (current) drug therapy: Secondary | ICD-10-CM | POA: Insufficient documentation

## 2012-08-26 LAB — URINALYSIS, ROUTINE W REFLEX MICROSCOPIC
Bilirubin Urine: NEGATIVE
Glucose, UA: NEGATIVE mg/dL
Hgb urine dipstick: NEGATIVE
Ketones, ur: NEGATIVE mg/dL
Leukocytes, UA: NEGATIVE
Nitrite: NEGATIVE
Protein, ur: NEGATIVE mg/dL
Specific Gravity, Urine: 1.017 (ref 1.005–1.030)
Urobilinogen, UA: 0.2 mg/dL (ref 0.0–1.0)
pH: 6.5 (ref 5.0–8.0)

## 2012-08-26 MED ORDER — IBUPROFEN 400 MG PO TABS
600.0000 mg | ORAL_TABLET | Freq: Once | ORAL | Status: AC
  Administered 2012-08-26: 600 mg via ORAL
  Filled 2012-08-26: qty 1

## 2012-08-26 MED ORDER — CYCLOBENZAPRINE HCL 10 MG PO TABS: 10.0000 mg | ORAL_TABLET | Freq: Two times a day (BID) | ORAL | Status: DC | PRN

## 2012-08-26 MED ORDER — METHOCARBAMOL 500 MG PO TABS: 500.0000 mg | ORAL_TABLET | Freq: Four times a day (QID) | ORAL | Status: DC

## 2012-08-26 NOTE — ED Notes (Signed)
When going over discharge papers/perscriptions, pt sts "muscle relaxers don't help me. Can I get something else." ED PA notifed. At bedside talking w/ patient.

## 2012-08-26 NOTE — ED Notes (Signed)
Recent flu. Here for c.o back pain. Has been 2 unknown antibiotics for swollen glands in her neck and scabs in her head.

## 2012-08-26 NOTE — ED Notes (Signed)
Pt sts "I'm used to taking shots or morphine, this ibuprofen wont help, but I'll take it".

## 2012-08-26 NOTE — ED Provider Notes (Signed)
History     CSN: [id number]  Arrival date & time 08/26/12  1931   First MD Initiated Contact with Patient 08/26/12 2018      Chief Complaint  Patient presents with  . Back Pain    (Consider location/radiation/quality/duration/timing/severity/associated sxs/prior treatment) Patient is a 57 y.o. female presenting with back pain. The history is provided by the patient. No language interpreter was used.  Back Pain  This is a recurrent problem. The current episode started 12 to 24 hours ago. The problem occurs constantly. The pain is associated with twisting. The quality of the pain is described as aching. The pain is at a severity of 8/10. The pain is moderate. The symptoms are aggravated by twisting and bending. The pain is the same all the time. Associated symptoms include chest pain. Pertinent negatives include no numbness, no bowel incontinence, no perianal numbness, no leg pain, no tingling and no weakness. She has tried nothing for the symptoms.  L shoulder/back/chest pain that is constant 8/10 achy pain that she woke with this am.  She has had this same pain on and off for 3 years to the same area.  Cardiac cath in 2012 shows minimal heart disease.  She has taken nothing for pain.  Pain is worse with a twisting and bending motion.  Better when she sits still.    Past Medical History  Diagnosis Date  . Diabetes mellitus   . Hypertension   . Hypercholesteremia     History reviewed. No pertinent past surgical history.  No family history on file.  History  Substance Use Topics  . Smoking status: Never Smoker   . Smokeless tobacco: Not on file  . Alcohol Use: No    OB History    Grav Para Term Preterm Abortions TAB SAB Ect Mult Living                  Review of Systems  Constitutional: Negative.   HENT: Negative.   Eyes: Negative.   Respiratory: Negative.  Negative for cough and shortness of breath.   Cardiovascular: Positive for chest pain.  Gastrointestinal:  Negative.  Negative for nausea, vomiting and bowel incontinence.  Musculoskeletal: Positive for back pain.  Neurological: Negative.  Negative for tingling, weakness and numbness.  Psychiatric/Behavioral: Negative.   All other systems reviewed and are negative.    Allergies  Aspirin and Latex  Home Medications   Current Outpatient Rx  Name  Route  Sig  Dispense  Refill  . ALBUTEROL SULFATE HFA 108 (90 BASE) MCG/ACT IN AERS   Inhalation   Inhale 2 puffs into the lungs every 6 (six) hours as needed. For shortness of breath         . ATENOLOL 50 MG PO TABS   Oral   Take 50 mg by mouth daily.         . AZITHROMYCIN 250 MG PO TABS   Oral   Take 1 tablet (250 mg total) by mouth daily. Take first 2 tablets together, then 1 every day until finished.   6 tablet   0   . HYDROCODONE-ACETAMINOPHEN 5-400 MG PO TABS   Oral   Take 1 tablet by mouth every 6 (six) hours as needed. For pain         . METFORMIN HCL 500 MG PO TABS   Oral   Take 500 mg by mouth 2 (two) times daily with a meal.         . POTASSIUM CHLORIDE CRYS  ER 15 MEQ PO TBCR   Oral   Take 15 mEq by mouth 2 (two) times daily.         Marland Kitchen SIMVASTATIN 40 MG PO TABS   Oral   Take 40 mg by mouth every evening.           BP 137/92  Pulse 90  Temp 98.1 F (36.7 C) (Oral)  Resp 18  SpO2 96%  Physical Exam  Nursing note and vitals reviewed. Constitutional: She is oriented to person, place, and time. She appears well-developed and well-nourished.  HENT:  Head: Normocephalic and atraumatic.  Eyes: Conjunctivae normal and EOM are normal. Pupils are equal, round, and reactive to light.  Neck: Normal range of motion. Neck supple.  Cardiovascular: Normal rate.   Pulmonary/Chest: Effort normal. She exhibits tenderness.  Abdominal: Soft.  Musculoskeletal: Normal range of motion. She exhibits tenderness. She exhibits no edema.       L scapula tenderness  Neurological: She is alert and oriented to person, place,  and time. She has normal reflexes.  Skin: Skin is warm and dry.  Psychiatric: She has a normal mood and affect.    ED Course  Procedures (including critical care time)   Labs Reviewed  URINALYSIS, ROUTINE W REFLEX MICROSCOPIC   No results found.   No diagnosis found.    MDM   Acute on chronic L scapula musculoskeletal pain treated with ibuprofen in the ER.  rx for robaxin/ice.  Follow up with pcp of choice as needed.     Date: 08/26/2012  Rate: 79  Rhythm: normal sinus rhythm  QRS Axis: normal  Intervals: normal  ST/T Wave abnormalities: nonspecific T wave changes  Conduction Disutrbances:none  Narrative Interpretation:   Old EKG Reviewed: unchanged          Remi Haggard, NP 08/27/12 1427

## 2012-08-27 NOTE — ED Provider Notes (Signed)
Medical screening examination/treatment/procedure(s) were performed by non-physician practitioner and as supervising physician I was immediately available for consultation/collaboration.   Laretha Luepke B. Khrystian Schauf, MD 08/27/12 1528 

## 2012-09-24 ENCOUNTER — Other Ambulatory Visit (HOSPITAL_BASED_OUTPATIENT_CLINIC_OR_DEPARTMENT_OTHER): Payer: Self-pay | Admitting: Internal Medicine

## 2012-09-24 DIAGNOSIS — Z0000 Encounter for general adult medical examination without abnormal findings: Secondary | ICD-10-CM

## 2012-09-29 ENCOUNTER — Encounter (HOSPITAL_COMMUNITY): Payer: Self-pay | Admitting: Emergency Medicine

## 2012-09-29 ENCOUNTER — Emergency Department (HOSPITAL_COMMUNITY)
Admission: EM | Admit: 2012-09-29 | Discharge: 2012-09-29 | Disposition: A | Discharge status: Home / Self Care | Payer: Medicare Other | Attending: Emergency Medicine | Admitting: Emergency Medicine

## 2012-09-29 ENCOUNTER — Emergency Department (HOSPITAL_COMMUNITY): Payer: Medicare Other

## 2012-09-29 DIAGNOSIS — R05 Cough: Secondary | ICD-10-CM | POA: Insufficient documentation

## 2012-09-29 DIAGNOSIS — R51 Headache: Secondary | ICD-10-CM | POA: Diagnosis not present

## 2012-09-29 DIAGNOSIS — R5383 Other fatigue: Secondary | ICD-10-CM | POA: Insufficient documentation

## 2012-09-29 DIAGNOSIS — Z79899 Other long term (current) drug therapy: Secondary | ICD-10-CM | POA: Diagnosis not present

## 2012-09-29 DIAGNOSIS — R059 Cough, unspecified: Secondary | ICD-10-CM | POA: Diagnosis not present

## 2012-09-29 DIAGNOSIS — R079 Chest pain, unspecified: Secondary | ICD-10-CM | POA: Diagnosis not present

## 2012-09-29 DIAGNOSIS — E119 Type 2 diabetes mellitus without complications: Secondary | ICD-10-CM | POA: Diagnosis not present

## 2012-09-29 DIAGNOSIS — I10 Essential (primary) hypertension: Secondary | ICD-10-CM | POA: Insufficient documentation

## 2012-09-29 DIAGNOSIS — E7800 Pure hypercholesterolemia, unspecified: Secondary | ICD-10-CM | POA: Diagnosis not present

## 2012-09-29 DIAGNOSIS — E876 Hypokalemia: Secondary | ICD-10-CM | POA: Insufficient documentation

## 2012-09-29 DIAGNOSIS — R0602 Shortness of breath: Secondary | ICD-10-CM | POA: Insufficient documentation

## 2012-09-29 DIAGNOSIS — R5381 Other malaise: Secondary | ICD-10-CM | POA: Insufficient documentation

## 2012-09-29 DIAGNOSIS — H65199 Other acute nonsuppurative otitis media, unspecified ear: Secondary | ICD-10-CM | POA: Diagnosis not present

## 2012-09-29 DIAGNOSIS — Z8719 Personal history of other diseases of the digestive system: Secondary | ICD-10-CM | POA: Diagnosis not present

## 2012-09-29 DIAGNOSIS — J069 Acute upper respiratory infection, unspecified: Secondary | ICD-10-CM | POA: Diagnosis not present

## 2012-09-29 DIAGNOSIS — J029 Acute pharyngitis, unspecified: Secondary | ICD-10-CM | POA: Diagnosis not present

## 2012-09-29 DIAGNOSIS — R0789 Other chest pain: Secondary | ICD-10-CM | POA: Diagnosis not present

## 2012-09-29 DIAGNOSIS — H6690 Otitis media, unspecified, unspecified ear: Secondary | ICD-10-CM

## 2012-09-29 DIAGNOSIS — J3489 Other specified disorders of nose and nasal sinuses: Secondary | ICD-10-CM | POA: Insufficient documentation

## 2012-09-29 HISTORY — DX: Hypokalemia: E87.6

## 2012-09-29 LAB — BASIC METABOLIC PANEL
BUN: 10 mg/dL (ref 6–23)
CO2: 31 mEq/L (ref 19–32)
Calcium: 8.7 mg/dL (ref 8.4–10.5)
Chloride: 101 mEq/L (ref 96–112)
Creatinine, Ser: 0.81 mg/dL (ref 0.50–1.10)
GFR calc Af Amer: >90 mL/min (ref >90)
GFR calc non Af Amer: 80 mL/min — ABNORMAL LOW (ref >90)
Glucose, Bld: 103 mg/dL — ABNORMAL HIGH (ref 70–99)
Potassium: 3.3 mEq/L — ABNORMAL LOW (ref 3.5–5.1)
Sodium: 139 mEq/L (ref 135–145)

## 2012-09-29 LAB — CBC
HCT: 36.4 % (ref 36.0–46.0)
Hemoglobin: 12.4 g/dL (ref 12.0–15.0)
MCH: 29 pg (ref 26.0–34.0)
MCHC: 34.1 g/dL (ref 30.0–36.0)
MCV: 85 fL (ref 78.0–100.0)
Platelets: 202 10*3/uL (ref 150–400)
RBC: 4.28 MIL/uL (ref 3.87–5.11)
RDW: 13.3 % (ref 11.5–15.5)
WBC: 5.7 10*3/uL (ref 4.0–10.5)

## 2012-09-29 LAB — POCT I-STAT TROPONIN I: Troponin i, poc: 0.01 ng/mL (ref 0.00–0.08)

## 2012-09-29 LAB — TROPONIN I: Troponin I: <0.3 ng/mL (ref <0.30)

## 2012-09-29 LAB — D-DIMER, QUANTITATIVE: D-Dimer, Quant: 0.45 ug/mL-FEU (ref 0.00–0.48)

## 2012-09-29 MED ORDER — GI COCKTAIL ~~LOC~~
30.0000 mL | Freq: Once | ORAL | Status: AC
  Administered 2012-09-29: 30 mL via ORAL
  Filled 2012-09-29: qty 30

## 2012-09-29 MED ORDER — MORPHINE SULFATE 4 MG/ML IJ SOLN
4.0000 mg | Freq: Once | INTRAMUSCULAR | Status: AC
  Administered 2012-09-29: 4 mg via INTRAVENOUS
  Filled 2012-09-29: qty 1

## 2012-09-29 MED ORDER — AMOXICILLIN 500 MG PO CAPS: 500.0000 mg | ORAL_CAPSULE | Freq: Three times a day (TID) | ORAL | Status: DC

## 2012-09-29 MED ORDER — POTASSIUM CHLORIDE CRYS ER 20 MEQ PO TBCR
40.0000 meq | EXTENDED_RELEASE_TABLET | Freq: Once | ORAL | Status: AC
  Administered 2012-09-29: 40 meq via ORAL
  Filled 2012-09-29 (×2): qty 1

## 2012-09-29 MED ORDER — OMEPRAZOLE 20 MG PO CPDR: 20.0000 mg | DELAYED_RELEASE_CAPSULE | Freq: Every day | ORAL | Status: DC

## 2012-09-29 NOTE — ED Provider Notes (Signed)
History     CSN: [id number]  Arrival date & time 09/29/12  1326   First MD Initiated Contact with Patient 09/29/12 1409      Chief Complaint  Patient presents with  . Chest Pain    (Consider location/radiation/quality/duration/timing/severity/associated sxs/prior treatment) Patient is a 57 y.o. female presenting with chest pain. The history is provided by the patient and medical records.  Chest Pain Associated symptoms: cough, fatigue, headache and shortness of breath   Associated symptoms: no abdominal pain, no back pain, no fever, no nausea, no numbness, no palpitations and not vomiting     Bonnie Gordon is a 57 y.o. female  with a hx of DM, HTN, HLD presents to the Emergency Department complaining of gradual, intermittent, progressively worsening chest pain onset 1 month ago, worsening 1 week ago and then changing last night.  Yesterday evening pt pain became constant, substernal, radiating into the center of her neck and into the R arm.  Pt with heart cath (2005 and 2012) for evaluation of chest pain without evidence of CAD.  FHx of CHF.  Associated symptoms include nasal congestion, rhinorrhea, epistaxis, cough, shortness of breath, headache, chills.  Nothing makes it better and nothing makes it worse.  Pt denies fever, abdominal pain, nausea, vomiting, diarrhea, weakness, dizziness, syncope, dysuria.  Pt is supposed to be taking Rx for GERD, but is not doing so.     Past Medical History  Diagnosis Date  . Diabetes mellitus   . Hypertension   . Hypercholesteremia   . Hypokalemia     History reviewed. No pertinent past surgical history.  No family history on file.  History  Substance Use Topics  . Smoking status: Never Smoker   . Smokeless tobacco: Not on file  . Alcohol Use: No    OB History   Grav Para Term Preterm Abortions TAB SAB Ect Mult Living                  Review of Systems  Constitutional: Positive for fatigue. Negative for fever, chills and appetite  change.  HENT: Positive for ear pain, nosebleeds, congestion, sore throat, rhinorrhea, postnasal drip and sinus pressure. Negative for mouth sores, neck stiffness and ear discharge.   Eyes: Negative for visual disturbance.  Respiratory: Positive for cough, chest tightness and shortness of breath. Negative for wheezing and stridor.   Cardiovascular: Positive for chest pain. Negative for palpitations and leg swelling.  Gastrointestinal: Negative for nausea, vomiting, abdominal pain and diarrhea.  Genitourinary: Negative for dysuria, urgency, frequency and hematuria.  Musculoskeletal: Negative for myalgias, back pain and arthralgias.  Skin: Negative for rash.  Neurological: Positive for headaches. Negative for syncope, light-headedness and numbness.  Hematological: Negative for adenopathy.  Psychiatric/Behavioral: The patient is not nervous/anxious.   All other systems reviewed and are negative.    Allergies  Aspirin and Latex  Home Medications   Current Outpatient Rx  Name  Route  Sig  Dispense  Refill  . albuterol (PROVENTIL HFA;VENTOLIN HFA) 108 (90 BASE) MCG/ACT inhaler   Inhalation   Inhale 2 puffs into the lungs every 6 (six) hours as needed. For shortness of breath         . atorvastatin (LIPITOR) 40 MG tablet   Oral   Take 40 mg by mouth daily.         Marland Kitchen glimepiride (AMARYL) 2 MG tablet   Oral   Take 2 mg by mouth daily before breakfast.         .  methocarbamol (ROBAXIN) 500 MG tablet   Oral   Take 500 mg by mouth 4 (four) times daily as needed (for pain).         . potassium chloride SA (K-DUR,KLOR-CON) 20 MEQ tablet   Oral   Take 20 mEq by mouth daily.         . pravastatin (PRAVACHOL) 40 MG tablet   Oral   Take 40 mg by mouth daily.         Marland Kitchen triamterene-hydrochlorothiazide (MAXZIDE-25) 37.5-25 MG per tablet   Oral   Take 1 tablet by mouth daily.         Marland Kitchen amoxicillin (AMOXIL) 500 MG capsule   Oral   Take 1 capsule (500 mg total) by mouth 3  (three) times daily.   21 capsule   0   . omeprazole (PRILOSEC) 20 MG capsule   Oral   Take 1 capsule (20 mg total) by mouth daily.   30 capsule   0     BP 155/91  Pulse 91  Temp(Src) 98.7 F (37.1 C) (Oral)  Resp 12  SpO2 98%  Physical Exam  Nursing note and vitals reviewed. Constitutional: She is oriented to person, place, and time. She appears well-developed and well-nourished. No distress.  HENT:  Head: Normocephalic and atraumatic.  Right Ear: Tympanic membrane, external ear and ear canal normal.  Left Ear: External ear and ear canal normal. Tympanic membrane is erythematous and bulging. A middle ear effusion is present.  Nose: Mucosal edema and rhinorrhea present. No epistaxis. Right sinus exhibits no maxillary sinus tenderness and no frontal sinus tenderness. Left sinus exhibits no maxillary sinus tenderness and no frontal sinus tenderness.  Mouth/Throat: Uvula is midline, oropharynx is clear and moist and mucous membranes are normal. Mucous membranes are not pale, not dry and not cyanotic. No oropharyngeal exudate, posterior oropharyngeal edema, posterior oropharyngeal erythema or tonsillar abscesses.  Eyes: Conjunctivae are normal. Pupils are equal, round, and reactive to light. No scleral icterus.  Neck: Normal range of motion and full passive range of motion without pain. Neck supple.  Cardiovascular: Normal rate, regular rhythm, normal heart sounds and intact distal pulses.  Exam reveals no gallop and no friction rub.   No murmur heard. Pulmonary/Chest: Effort normal and breath sounds normal. No stridor. No respiratory distress. She has no wheezes. She has no rales. She exhibits tenderness (sternal).  Abdominal: Soft. Bowel sounds are normal. She exhibits no mass. There is no tenderness. There is no rebound and no guarding.  Musculoskeletal: Normal range of motion. She exhibits no edema.  Lymphadenopathy:    She has no cervical adenopathy.  Neurological: She is alert  and oriented to person, place, and time. She exhibits normal muscle tone. Coordination normal.  Speech is clear and goal oriented Moves extremities without ataxia  Skin: Skin is warm and dry. No rash noted. She is not diaphoretic.  Psychiatric: She has a normal mood and affect.    ED Course  Procedures (including critical care time)  Labs Reviewed  BASIC METABOLIC PANEL - Abnormal; Notable for the following:    Potassium 3.3 (*)    Glucose, Bld 103 (*)    GFR calc non Af Amer 80 (*)    All other components within normal limits  CBC  D-DIMER, QUANTITATIVE  TROPONIN I  POCT I-STAT TROPONIN I   Dg Chest 2 View  09/29/2012  *RADIOLOGY REPORT*  Clinical Data: Chest pain  CHEST - 2 VIEW  Comparison: 10/05/2011  Findings: Cardiomediastinal  silhouette is stable.  No acute infiltrate or pleural effusion.  No pulmonary edema.  Bony thorax is stable.  IMPRESSION: No active disease.  No significant change.   Original Report Authenticated By: Natasha Mead, M.D.    ECG:  Date: 09/29/2012  Rate: 106  Rhythm: sinus tachycardia  QRS Axis: normal  Intervals: normal  ST/T Wave abnormalities: normal  Conduction Disutrbances:none  Narrative Interpretation: Sinus tach, nonischemic, unchanged from previous  Old EKG Reviewed: unchanged    1. Chest pain   2. Otitis media   3. URI (upper respiratory infection)       MDM  Bonnie Gordon presents for worsening chest pain and URI symptoms.  Patient presents with otalgia and exam consistent with acute otitis media. No concern for acute mastoiditis, meningitis.  No antibiotic use in the last month.  Patient discharged home with Amoxicillin.  Pt also with constant chest pain. Chest pain is not likely of cardiac or pulmonary etiology d/t presentation, VSS, no tracheal deviation, no JVD or new murmur, RRR, breath sounds equal bilaterally, EKG without acute abnormalities, negative troponin x2, negative d-dimer and negative CXR. Pt has been advised to restart her  PPI and return to the ED if CP becomes exertional, associated with diaphoresis or nausea, radiates to left jaw/arm, worsens or becomes concerning in any way. Pt appears reliable for follow up and is agreeable to discharge.   Case has been discussed with Dr. Clarene Duke who agrees with the above plan to discharge.        Dahlia Client Floris Neuhaus, PA-C 09/29/12 1809

## 2012-09-29 NOTE — ED Notes (Signed)
Pt presenting to ed with c/o chest pain onset greater than one week pt denies nausea and vomiting at this time. Pt states positive shortness of breath and dizziness. Pt states pain radiates into her right arm pt states pain is worse this past week.

## 2012-09-30 ENCOUNTER — Ambulatory Visit (HOSPITAL_BASED_OUTPATIENT_CLINIC_OR_DEPARTMENT_OTHER)
Admission: RE | Admit: 2012-09-30 | Discharge: 2012-09-30 | Disposition: A | Discharge status: Home / Self Care | Payer: Medicare Other | Source: Ambulatory Visit | Attending: Internal Medicine | Admitting: Internal Medicine

## 2012-09-30 DIAGNOSIS — Z0000 Encounter for general adult medical examination without abnormal findings: Secondary | ICD-10-CM

## 2012-09-30 DIAGNOSIS — Z1231 Encounter for screening mammogram for malignant neoplasm of breast: Secondary | ICD-10-CM | POA: Insufficient documentation

## 2012-10-02 NOTE — ED Provider Notes (Signed)
Medical screening examination/treatment/procedure(s) were performed by non-physician practitioner and as supervising physician I was immediately available for consultation/collaboration.   Laray Anger, DO 10/02/12 2310

## 2012-10-04 DIAGNOSIS — I10 Essential (primary) hypertension: Secondary | ICD-10-CM | POA: Diagnosis not present

## 2012-10-04 DIAGNOSIS — E876 Hypokalemia: Secondary | ICD-10-CM | POA: Diagnosis not present

## 2012-10-04 DIAGNOSIS — K219 Gastro-esophageal reflux disease without esophagitis: Secondary | ICD-10-CM | POA: Diagnosis not present

## 2012-10-04 DIAGNOSIS — F329 Major depressive disorder, single episode, unspecified: Secondary | ICD-10-CM | POA: Diagnosis not present

## 2012-10-04 DIAGNOSIS — L989 Disorder of the skin and subcutaneous tissue, unspecified: Secondary | ICD-10-CM | POA: Diagnosis not present

## 2012-10-04 DIAGNOSIS — F3289 Other specified depressive episodes: Secondary | ICD-10-CM | POA: Diagnosis not present

## 2012-10-04 DIAGNOSIS — E119 Type 2 diabetes mellitus without complications: Secondary | ICD-10-CM | POA: Diagnosis not present

## 2012-10-04 DIAGNOSIS — E785 Hyperlipidemia, unspecified: Secondary | ICD-10-CM | POA: Diagnosis not present

## 2012-11-28 DIAGNOSIS — I10 Essential (primary) hypertension: Secondary | ICD-10-CM | POA: Diagnosis not present

## 2012-11-28 DIAGNOSIS — H2510 Age-related nuclear cataract, unspecified eye: Secondary | ICD-10-CM | POA: Diagnosis not present

## 2012-11-28 DIAGNOSIS — E785 Hyperlipidemia, unspecified: Secondary | ICD-10-CM | POA: Diagnosis not present

## 2012-11-28 DIAGNOSIS — E119 Type 2 diabetes mellitus without complications: Secondary | ICD-10-CM | POA: Diagnosis not present

## 2012-11-28 DIAGNOSIS — K219 Gastro-esophageal reflux disease without esophagitis: Secondary | ICD-10-CM | POA: Diagnosis not present

## 2012-11-28 DIAGNOSIS — M79609 Pain in unspecified limb: Secondary | ICD-10-CM | POA: Diagnosis not present

## 2012-11-28 DIAGNOSIS — F329 Major depressive disorder, single episode, unspecified: Secondary | ICD-10-CM | POA: Diagnosis not present

## 2012-11-28 DIAGNOSIS — F3289 Other specified depressive episodes: Secondary | ICD-10-CM | POA: Diagnosis not present

## 2012-11-28 DIAGNOSIS — M7989 Other specified soft tissue disorders: Secondary | ICD-10-CM | POA: Diagnosis not present

## 2012-12-27 ENCOUNTER — Emergency Department (HOSPITAL_BASED_OUTPATIENT_CLINIC_OR_DEPARTMENT_OTHER)
Admission: EM | Admit: 2012-12-27 | Discharge: 2012-12-27 | Disposition: A | Discharge status: Home / Self Care | Payer: Medicare Other | Attending: Emergency Medicine | Admitting: Emergency Medicine

## 2012-12-27 ENCOUNTER — Emergency Department (HOSPITAL_BASED_OUTPATIENT_CLINIC_OR_DEPARTMENT_OTHER): Payer: Medicare Other

## 2012-12-27 ENCOUNTER — Encounter (HOSPITAL_BASED_OUTPATIENT_CLINIC_OR_DEPARTMENT_OTHER): Payer: Self-pay | Admitting: Emergency Medicine

## 2012-12-27 DIAGNOSIS — R630 Anorexia: Secondary | ICD-10-CM | POA: Insufficient documentation

## 2012-12-27 DIAGNOSIS — E7800 Pure hypercholesterolemia, unspecified: Secondary | ICD-10-CM | POA: Insufficient documentation

## 2012-12-27 DIAGNOSIS — Z79899 Other long term (current) drug therapy: Secondary | ICD-10-CM | POA: Diagnosis not present

## 2012-12-27 DIAGNOSIS — Z8639 Personal history of other endocrine, nutritional and metabolic disease: Secondary | ICD-10-CM | POA: Insufficient documentation

## 2012-12-27 DIAGNOSIS — Z91040 Latex allergy status: Secondary | ICD-10-CM | POA: Diagnosis not present

## 2012-12-27 DIAGNOSIS — R109 Unspecified abdominal pain: Secondary | ICD-10-CM | POA: Diagnosis not present

## 2012-12-27 DIAGNOSIS — Z9089 Acquired absence of other organs: Secondary | ICD-10-CM | POA: Diagnosis not present

## 2012-12-27 DIAGNOSIS — R143 Flatulence: Secondary | ICD-10-CM | POA: Insufficient documentation

## 2012-12-27 DIAGNOSIS — R142 Eructation: Secondary | ICD-10-CM | POA: Insufficient documentation

## 2012-12-27 DIAGNOSIS — K5732 Diverticulitis of large intestine without perforation or abscess without bleeding: Secondary | ICD-10-CM

## 2012-12-27 DIAGNOSIS — E119 Type 2 diabetes mellitus without complications: Secondary | ICD-10-CM | POA: Insufficient documentation

## 2012-12-27 DIAGNOSIS — R141 Gas pain: Secondary | ICD-10-CM | POA: Diagnosis not present

## 2012-12-27 DIAGNOSIS — Z862 Personal history of diseases of the blood and blood-forming organs and certain disorders involving the immune mechanism: Secondary | ICD-10-CM | POA: Diagnosis not present

## 2012-12-27 DIAGNOSIS — I10 Essential (primary) hypertension: Secondary | ICD-10-CM | POA: Diagnosis not present

## 2012-12-27 HISTORY — DX: Diverticulitis of intestine, part unspecified, without perforation or abscess without bleeding: K57.92

## 2012-12-27 LAB — COMPREHENSIVE METABOLIC PANEL
ALT: 22 U/L (ref 0–35)
AST: 17 U/L (ref 0–37)
Albumin: 3.4 g/dL — ABNORMAL LOW (ref 3.5–5.2)
Alkaline Phosphatase: 85 U/L (ref 39–117)
BUN: 10 mg/dL (ref 6–23)
CO2: 29 mEq/L (ref 19–32)
Calcium: 9.5 mg/dL (ref 8.4–10.5)
Chloride: 101 mEq/L (ref 96–112)
Creatinine, Ser: 0.9 mg/dL (ref 0.50–1.10)
GFR calc Af Amer: 81 mL/min — ABNORMAL LOW (ref >90)
GFR calc non Af Amer: 70 mL/min — ABNORMAL LOW (ref >90)
Glucose, Bld: 126 mg/dL — ABNORMAL HIGH (ref 70–99)
Potassium: 3.7 mEq/L (ref 3.5–5.1)
Sodium: 139 mEq/L (ref 135–145)
Total Bilirubin: 0.4 mg/dL (ref 0.3–1.2)
Total Protein: 7.2 g/dL (ref 6.0–8.3)

## 2012-12-27 LAB — CBC WITH DIFFERENTIAL/PLATELET
Basophils Absolute: 0 10*3/uL (ref 0.0–0.1)
Basophils Relative: 0 % (ref 0–1)
Eosinophils Absolute: 0.1 10*3/uL (ref 0.0–0.7)
Eosinophils Relative: 1 % (ref 0–5)
HCT: 37.6 % (ref 36.0–46.0)
Hemoglobin: 13.1 g/dL (ref 12.0–15.0)
Lymphocytes Relative: 24 % (ref 12–46)
Lymphs Abs: 1.9 10*3/uL (ref 0.7–4.0)
MCH: 29.6 pg (ref 26.0–34.0)
MCHC: 34.8 g/dL (ref 30.0–36.0)
MCV: 84.9 fL (ref 78.0–100.0)
Monocytes Absolute: 0.8 10*3/uL (ref 0.1–1.0)
Monocytes Relative: 10 % (ref 3–12)
Neutro Abs: 5 10*3/uL (ref 1.7–7.7)
Neutrophils Relative %: 65 % (ref 43–77)
Platelets: 183 10*3/uL (ref 150–400)
RBC: 4.43 MIL/uL (ref 3.87–5.11)
RDW: 13.3 % (ref 11.5–15.5)
WBC: 7.8 10*3/uL (ref 4.0–10.5)

## 2012-12-27 MED ORDER — HYDROMORPHONE HCL PF 1 MG/ML IJ SOLN
0.5000 mg | INTRAMUSCULAR | Status: AC | PRN
  Administered 2012-12-27 (×2): 0.5 mg via INTRAVENOUS
  Filled 2012-12-27: qty 1

## 2012-12-27 MED ORDER — AMOXICILLIN-POT CLAVULANATE 875-125 MG PO TABS: 1.0000 | ORAL_TABLET | Freq: Two times a day (BID) | ORAL | Status: DC

## 2012-12-27 MED ORDER — IOHEXOL 300 MG/ML  SOLN
100.0000 mL | Freq: Once | INTRAMUSCULAR | Status: AC | PRN
  Administered 2012-12-27: 100 mL via INTRAVENOUS

## 2012-12-27 MED ORDER — IOHEXOL 300 MG/ML  SOLN
50.0000 mL | Freq: Once | INTRAMUSCULAR | Status: AC | PRN
  Administered 2012-12-27: 50 mL via ORAL

## 2012-12-27 MED ORDER — SODIUM CHLORIDE 0.9 % IV BOLUS (SEPSIS)
1000.0000 mL | Freq: Once | INTRAVENOUS | Status: AC
  Administered 2012-12-27: 1000 mL via INTRAVENOUS

## 2012-12-27 MED ORDER — ONDANSETRON HCL 4 MG/2ML IJ SOLN
4.0000 mg | Freq: Four times a day (QID) | INTRAMUSCULAR | Status: DC | PRN
  Administered 2012-12-27: 4 mg via INTRAVENOUS
  Filled 2012-12-27: qty 2

## 2012-12-27 MED ORDER — PROMETHAZINE HCL 25 MG PO TABS: 25.0000 mg | ORAL_TABLET | Freq: Four times a day (QID) | ORAL | Status: DC | PRN

## 2012-12-27 MED ORDER — HYDROCODONE-ACETAMINOPHEN 5-325 MG PO TABS: ORAL_TABLET | ORAL | Status: DC

## 2012-12-27 MED ORDER — AMPICILLIN-SULBACTAM SODIUM 3 (2-1) G IJ SOLR
3.0000 g | Freq: Once | INTRAMUSCULAR | Status: AC
  Administered 2012-12-27: 3 g via INTRAVENOUS
  Filled 2012-12-27: qty 3

## 2012-12-27 NOTE — ED Notes (Signed)
Pt c/o diffuse abd pain. Denies n/v/d. Last BM yesterday which was normal.

## 2012-12-27 NOTE — ED Provider Notes (Signed)
History     CSN: [id number]  Arrival date & time 12/27/12  2952   First MD Initiated Contact with Patient 12/27/12 [phone]      Chief Complaint  Patient presents with  . Abdominal Pain    (Consider location/radiation/quality/duration/timing/severity/associated sxs/prior treatment) HPI Comments: Anorexia, has not eaten since yesterday afternoon.  Pt has had appendectomy and cholecystectomy.  Pt has had diverticulitis on 12/19/11 CT scan, but pt denies recalling this.  Pt drinks rarely, only socially.    Patient is a 57 y.o. female presenting with abdominal pain. The history is provided by the patient and medical records.  Abdominal Pain The current episode started more than 2 days ago. The problem occurs hourly. The problem has been gradually worsening. Associated symptoms include abdominal pain. Pertinent negatives include no chest pain and no shortness of breath. Nothing aggravates the symptoms. Nothing relieves the symptoms. She has tried nothing for the symptoms.    Past Medical History  Diagnosis Date  . Diabetes mellitus   . Hypertension   . Hypercholesteremia   . Hypokalemia   . Diverticulitis     History reviewed. No pertinent past surgical history.  No family history on file.  History  Substance Use Topics  . Smoking status: Never Smoker   . Smokeless tobacco: Not on file  . Alcohol Use: No    OB History   Grav Para Term Preterm Abortions TAB SAB Ect Mult Living                  Review of Systems  Constitutional: Positive for appetite change. Negative for fever and chills.  Respiratory: Negative for cough, chest tightness and shortness of breath.   Cardiovascular: Negative for chest pain.  Gastrointestinal: Positive for abdominal pain. Negative for nausea, vomiting, diarrhea, constipation and blood in stool.  All other systems reviewed and are negative.    Allergies  Aspirin and Latex  Home Medications   Current Outpatient Rx  Name  Route  Sig   Dispense  Refill  . albuterol (PROVENTIL HFA;VENTOLIN HFA) 108 (90 BASE) MCG/ACT inhaler   Inhalation   Inhale 2 puffs into the lungs every 6 (six) hours as needed. For shortness of breath         . glimepiride (AMARYL) 2 MG tablet   Oral   Take 2 mg by mouth daily before breakfast.         . triamterene-hydrochlorothiazide (MAXZIDE-25) 37.5-25 MG per tablet   Oral   Take 1 tablet by mouth daily.         Marland Kitchen amoxicillin (AMOXIL) 500 MG capsule   Oral   Take 1 capsule (500 mg total) by mouth 3 (three) times daily.   21 capsule   0   . amoxicillin-clavulanate (AUGMENTIN) 875-125 MG per tablet   Oral   Take 1 tablet by mouth 2 (two) times daily.   30 tablet   0   . atorvastatin (LIPITOR) 40 MG tablet   Oral   Take 40 mg by mouth daily.         Marland Kitchen HYDROcodone-acetaminophen (NORCO/VICODIN) 5-325 MG per tablet      1-2 tablets po q 6 hours prn moderate to severe pain   20 tablet   0   . methocarbamol (ROBAXIN) 500 MG tablet   Oral   Take 500 mg by mouth 4 (four) times daily as needed (for pain).         Marland Kitchen omeprazole (PRILOSEC) 20 MG capsule   Oral  Take 1 capsule (20 mg total) by mouth daily.   30 capsule   0   . potassium chloride SA (K-DUR,KLOR-CON) 20 MEQ tablet   Oral   Take 20 mEq by mouth daily.         . pravastatin (PRAVACHOL) 40 MG tablet   Oral   Take 40 mg by mouth daily.         . promethazine (PHENERGAN) 25 MG tablet   Oral   Take 1 tablet (25 mg total) by mouth every 6 (six) hours as needed for nausea.   20 tablet   0     BP 124/88  Pulse 104  Temp(Src) 98.7 F (37.1 C) (Oral)  Resp 20  Ht 5\' 6"  (1.676 m)  Wt 185 lb (83.915 kg)  BMI 29.87 kg/m2  SpO2 94%  Physical Exam  Nursing note and vitals reviewed. Constitutional: She is oriented to person, place, and time. She appears well-developed and well-nourished. No distress.  HENT:  Head: Normocephalic and atraumatic.  Mouth/Throat: Oropharynx is clear and moist.  Eyes:  Conjunctivae and EOM are normal. No scleral icterus.  Neck: Neck supple.  Cardiovascular: Normal rate and intact distal pulses.   No murmur heard. Pulmonary/Chest: Effort normal.  Abdominal: Soft. Normal appearance. She exhibits distension. There is tenderness. There is no rebound and no CVA tenderness.    Neurological: She is alert and oriented to person, place, and time. She exhibits normal muscle tone. Coordination normal.  Skin: Skin is warm and dry. No rash noted. She is not diaphoretic.  Psychiatric: She has a normal mood and affect.    ED Course  Procedures (including critical care time)  Labs Reviewed  COMPREHENSIVE METABOLIC PANEL - Abnormal; Notable for the following:    Glucose, Bld 126 (*)    Albumin 3.4 (*)    GFR calc non Af Amer 70 (*)    GFR calc Af Amer 81 (*)    All other components within normal limits  CBC WITH DIFFERENTIAL  URINALYSIS, ROUTINE W REFLEX MICROSCOPIC   Ct Abdomen Pelvis W Contrast  12/27/2012   *RADIOLOGY REPORT*  Clinical Data: Diffuse abdominal pain  CT ABDOMEN AND PELVIS WITH CONTRAST  Technique:  Multidetector CT imaging of the abdomen and pelvis was performed following the standard protocol during bolus administration of intravenous contrast.  Contrast: 100 Omnipaque-300  IV  Comparison: 12/20/2011  Findings: Lung bases are essentially clear.  Hepatic steatosis.  Spleen, pancreas, and adrenal glands are within normal limits.  Status post cholecystectomy.  No intrahepatic or extrahepatic ductal dilatation.  11 mm right upper pole renal cyst (series 7/image 19).  7 mm probable left lower pole renal cyst (series 7/image 26).  No hydronephrosis.  No evidence of bowel obstruction.  Extensive colonic diverticulosis with mild diverticulitis in the mid/distal transverse colon (series 2/images 48 and 50).  No drainable fluid collection/abscess.  No free air to suggest macroscopic perforation.  No evidence of abdominal aortic aneurysm.  No abdominopelvic  ascites.  No suspicious abdominopelvic lymphadenopathy.  Uterus and bilateral ovaries are unremarkable.  Bladder is within normal limits.  Degenerative changes of the visualized thoracolumbar spine.  IMPRESSION: Mild diverticulitis involving the mid/distal transverse colon.  No drainable fluid collection/abscess.  No free air to suggest macroscopic perforation.   Original Report Authenticated By: Charline Bills, M.D.     1. Diverticulitis large intestine     ra sat is 94% and I interpret to be adequate.   9:31 AM Pain improved, mild diverticulitis  seen on CT.  WBC is normal.  No abscess or perf.  Pt will be given dose of unasyn, Rx for augementin, analgesics and instructed to follow up closely with PCP.    MDM  Pt likely with diverticulitis again based on histroy and location o pain, exam.  Likely miod, will get CT.  Less likely I suspect a partial obstruction, but on differential.  IVF's, IV analgesics provided.          Gavin Pound. Oletta Lamas, MD 12/27/12 [phone]

## 2012-12-27 NOTE — ED Notes (Signed)
Pt unable to give urine sample at this time 

## 2012-12-27 NOTE — Discharge Instructions (Signed)
Diverticulitis °A diverticulum is a small pouch or sac on the colon. Diverticulosis is the presence of these diverticula on the colon. Diverticulitis is the irritation (inflammation) or infection of diverticula. °CAUSES  °The colon and its diverticula contain bacteria. If food particles block the tiny opening to a diverticulum, the bacteria inside can grow and cause an increase in pressure. This leads to infection and inflammation and is called diverticulitis. °SYMPTOMS  °· Abdominal pain and tenderness. Usually, the pain is located on the left side of your abdomen. However, it could be located elsewhere. °· Fever. °· Bloating. °· Feeling sick to your stomach (nausea). °· Throwing up (vomiting). °· Abnormal stools. °DIAGNOSIS  °Your caregiver will take a history and perform a physical exam. Since many things can cause abdominal pain, other tests may be necessary. Tests may include: °· Blood tests. °· Urine tests. °· X-ray of the abdomen. °· CT scan of the abdomen. °Sometimes, surgery is needed to determine if diverticulitis or other conditions are causing your symptoms. °TREATMENT  °Most of the time, you can be treated without surgery. Treatment includes: °· Resting the bowels by only having liquids for a few days. As you improve, you will need to eat a low-fiber diet. °· Intravenous (IV) fluids if you are losing body fluids (dehydrated). °· Antibiotic medicines that treat infections may be given. °· Pain and nausea medicine, if needed. °· Surgery if the inflamed diverticulum has burst. °HOME CARE INSTRUCTIONS  °· Try a clear liquid diet (broth, tea, or water for as long as directed by your caregiver). You may then gradually begin a low-fiber diet as tolerated.  °A low-fiber diet is a diet with less than 10 grams of fiber. Choose the foods below to reduce fiber in the diet: °· White breads, cereals, rice, and pasta. °· Cooked fruits and vegetables or soft fresh fruits and vegetables without the skin. °· Ground or  well-cooked tender beef, ham, veal, lamb, pork, or poultry. °· Eggs and seafood. °· After your diverticulitis symptoms have improved, your caregiver may put you on a high-fiber diet. A high-fiber diet includes 14 grams of fiber for every 1000 calories consumed. For a standard 2000 calorie diet, you would need 28 grams of fiber. Follow these diet guidelines to help you increase the fiber in your diet. It is important to slowly increase the amount fiber in your diet to avoid gas, constipation, and bloating. °· Choose whole-grain breads, cereals, pasta, and brown rice. °· Choose fresh fruits and vegetables with the skin on. Do not overcook vegetables because the more vegetables are cooked, the more fiber is lost. °· Choose more nuts, seeds, legumes, dried peas, beans, and lentils. °· Look for food products that have greater than 3 grams of fiber per serving on the Nutrition Facts label. °· Take all medicine as directed by your caregiver. °· If your caregiver has given you a follow-up appointment, it is very important that you go. Not going could result in lasting (chronic) or permanent injury, pain, and disability. If there is any problem keeping the appointment, call to reschedule. °SEEK MEDICAL CARE IF:  °· Your pain does not improve. °· You have a hard time advancing your diet beyond clear liquids. °· Your bowel movements do not return to normal. °SEEK IMMEDIATE MEDICAL CARE IF:  °· Your pain becomes worse. °· You have an oral temperature above 102° F (38.9° C), not controlled by medicine. °· You have repeated vomiting. °· You have bloody or black, tarry stools. °·   Symptoms that brought you to your caregiver become worse or are not getting better. MAKE SURE YOU:   Understand these instructions.  Will watch your condition.  Will get help right away if you are not doing well or get worse. Document Released: 05/03/2005 Document Revised: 10/16/2011 Document Reviewed: 08/29/2010 [facility] Patient Information  2014 [employer], [place].    Narcotic and benzodiazepine use may cause drowsiness, slowed breathing or dependence.  Please use with caution and do not drive, operate machinery or watch young children alone while taking them.  Taking combinations of these medications or drinking alcohol will potentiate these effects.

## 2012-12-27 NOTE — ED Notes (Signed)
Urine cup provided.

## 2012-12-27 NOTE — ED Notes (Signed)
Pt to get ride home -- aware she cannot drive for 4 hours.

## 2013-01-09 DIAGNOSIS — R059 Cough, unspecified: Secondary | ICD-10-CM | POA: Diagnosis not present

## 2013-01-09 DIAGNOSIS — F329 Major depressive disorder, single episode, unspecified: Secondary | ICD-10-CM | POA: Diagnosis not present

## 2013-01-09 DIAGNOSIS — F3289 Other specified depressive episodes: Secondary | ICD-10-CM | POA: Diagnosis not present

## 2013-01-09 DIAGNOSIS — N898 Other specified noninflammatory disorders of vagina: Secondary | ICD-10-CM | POA: Diagnosis not present

## 2013-01-09 DIAGNOSIS — E119 Type 2 diabetes mellitus without complications: Secondary | ICD-10-CM | POA: Diagnosis not present

## 2013-01-09 DIAGNOSIS — E785 Hyperlipidemia, unspecified: Secondary | ICD-10-CM | POA: Diagnosis not present

## 2013-01-09 DIAGNOSIS — K219 Gastro-esophageal reflux disease without esophagitis: Secondary | ICD-10-CM | POA: Diagnosis not present

## 2013-01-09 DIAGNOSIS — R05 Cough: Secondary | ICD-10-CM | POA: Diagnosis not present

## 2013-01-09 DIAGNOSIS — I10 Essential (primary) hypertension: Secondary | ICD-10-CM | POA: Diagnosis not present

## 2013-01-09 DIAGNOSIS — R51 Headache: Secondary | ICD-10-CM | POA: Diagnosis not present

## 2013-01-09 DIAGNOSIS — M549 Dorsalgia, unspecified: Secondary | ICD-10-CM | POA: Diagnosis not present

## 2013-01-30 DIAGNOSIS — I10 Essential (primary) hypertension: Secondary | ICD-10-CM | POA: Diagnosis not present

## 2013-01-30 DIAGNOSIS — F3289 Other specified depressive episodes: Secondary | ICD-10-CM | POA: Diagnosis not present

## 2013-01-30 DIAGNOSIS — K219 Gastro-esophageal reflux disease without esophagitis: Secondary | ICD-10-CM | POA: Diagnosis not present

## 2013-01-30 DIAGNOSIS — F329 Major depressive disorder, single episode, unspecified: Secondary | ICD-10-CM | POA: Diagnosis not present

## 2013-01-30 DIAGNOSIS — E785 Hyperlipidemia, unspecified: Secondary | ICD-10-CM | POA: Diagnosis not present

## 2013-01-30 DIAGNOSIS — E1159 Type 2 diabetes mellitus with other circulatory complications: Secondary | ICD-10-CM | POA: Diagnosis not present

## 2013-01-30 DIAGNOSIS — IMO0002 Reserved for concepts with insufficient information to code with codable children: Secondary | ICD-10-CM | POA: Diagnosis not present

## 2013-02-11 DIAGNOSIS — B9689 Other specified bacterial agents as the cause of diseases classified elsewhere: Secondary | ICD-10-CM | POA: Diagnosis not present

## 2013-02-11 DIAGNOSIS — N3010 Interstitial cystitis (chronic) without hematuria: Secondary | ICD-10-CM | POA: Diagnosis not present

## 2013-02-11 DIAGNOSIS — R300 Dysuria: Secondary | ICD-10-CM | POA: Diagnosis not present

## 2013-02-11 DIAGNOSIS — IMO0002 Reserved for concepts with insufficient information to code with codable children: Secondary | ICD-10-CM | POA: Diagnosis not present

## 2013-02-11 DIAGNOSIS — A499 Bacterial infection, unspecified: Secondary | ICD-10-CM | POA: Diagnosis not present

## 2013-02-11 DIAGNOSIS — N760 Acute vaginitis: Secondary | ICD-10-CM | POA: Diagnosis not present

## 2013-02-27 DIAGNOSIS — F329 Major depressive disorder, single episode, unspecified: Secondary | ICD-10-CM | POA: Diagnosis not present

## 2013-02-27 DIAGNOSIS — IMO0002 Reserved for concepts with insufficient information to code with codable children: Secondary | ICD-10-CM | POA: Diagnosis not present

## 2013-02-27 DIAGNOSIS — E785 Hyperlipidemia, unspecified: Secondary | ICD-10-CM | POA: Diagnosis not present

## 2013-02-27 DIAGNOSIS — E1142 Type 2 diabetes mellitus with diabetic polyneuropathy: Secondary | ICD-10-CM | POA: Diagnosis not present

## 2013-02-27 DIAGNOSIS — I10 Essential (primary) hypertension: Secondary | ICD-10-CM | POA: Diagnosis not present

## 2013-02-27 DIAGNOSIS — K219 Gastro-esophageal reflux disease without esophagitis: Secondary | ICD-10-CM | POA: Diagnosis not present

## 2013-02-27 DIAGNOSIS — F3289 Other specified depressive episodes: Secondary | ICD-10-CM | POA: Diagnosis not present

## 2013-02-27 DIAGNOSIS — E119 Type 2 diabetes mellitus without complications: Secondary | ICD-10-CM | POA: Diagnosis not present

## 2013-03-04 DIAGNOSIS — N3010 Interstitial cystitis (chronic) without hematuria: Secondary | ICD-10-CM | POA: Diagnosis not present

## 2013-03-04 DIAGNOSIS — N949 Unspecified condition associated with female genital organs and menstrual cycle: Secondary | ICD-10-CM | POA: Diagnosis not present

## 2013-03-04 DIAGNOSIS — IMO0002 Reserved for concepts with insufficient information to code with codable children: Secondary | ICD-10-CM | POA: Diagnosis not present

## 2013-03-10 DIAGNOSIS — K219 Gastro-esophageal reflux disease without esophagitis: Secondary | ICD-10-CM | POA: Diagnosis not present

## 2013-03-10 DIAGNOSIS — E785 Hyperlipidemia, unspecified: Secondary | ICD-10-CM | POA: Diagnosis not present

## 2013-03-10 DIAGNOSIS — E1142 Type 2 diabetes mellitus with diabetic polyneuropathy: Secondary | ICD-10-CM | POA: Diagnosis not present

## 2013-03-10 DIAGNOSIS — I10 Essential (primary) hypertension: Secondary | ICD-10-CM | POA: Diagnosis not present

## 2013-03-10 DIAGNOSIS — F3289 Other specified depressive episodes: Secondary | ICD-10-CM | POA: Diagnosis not present

## 2013-03-10 DIAGNOSIS — F329 Major depressive disorder, single episode, unspecified: Secondary | ICD-10-CM | POA: Diagnosis not present

## 2013-03-10 DIAGNOSIS — E119 Type 2 diabetes mellitus without complications: Secondary | ICD-10-CM | POA: Diagnosis not present

## 2013-03-10 DIAGNOSIS — R609 Edema, unspecified: Secondary | ICD-10-CM | POA: Diagnosis not present

## 2013-03-12 ENCOUNTER — Emergency Department (HOSPITAL_COMMUNITY)
Admission: EM | Admit: 2013-03-12 | Discharge: 2013-03-12 | Disposition: A | Discharge status: Home / Self Care | Payer: Medicare Other | Attending: Emergency Medicine | Admitting: Emergency Medicine

## 2013-03-12 ENCOUNTER — Emergency Department (HOSPITAL_COMMUNITY): Payer: Medicare Other

## 2013-03-12 ENCOUNTER — Encounter (HOSPITAL_COMMUNITY): Payer: Self-pay | Admitting: Emergency Medicine

## 2013-03-12 DIAGNOSIS — E119 Type 2 diabetes mellitus without complications: Secondary | ICD-10-CM | POA: Diagnosis not present

## 2013-03-12 DIAGNOSIS — E7800 Pure hypercholesterolemia, unspecified: Secondary | ICD-10-CM | POA: Diagnosis not present

## 2013-03-12 DIAGNOSIS — Z79899 Other long term (current) drug therapy: Secondary | ICD-10-CM | POA: Diagnosis not present

## 2013-03-12 DIAGNOSIS — Z8719 Personal history of other diseases of the digestive system: Secondary | ICD-10-CM | POA: Insufficient documentation

## 2013-03-12 DIAGNOSIS — Z91040 Latex allergy status: Secondary | ICD-10-CM | POA: Diagnosis not present

## 2013-03-12 DIAGNOSIS — E876 Hypokalemia: Secondary | ICD-10-CM | POA: Diagnosis not present

## 2013-03-12 DIAGNOSIS — R0989 Other specified symptoms and signs involving the circulatory and respiratory systems: Secondary | ICD-10-CM | POA: Insufficient documentation

## 2013-03-12 DIAGNOSIS — I10 Essential (primary) hypertension: Secondary | ICD-10-CM | POA: Insufficient documentation

## 2013-03-12 DIAGNOSIS — R0609 Other forms of dyspnea: Secondary | ICD-10-CM | POA: Insufficient documentation

## 2013-03-12 DIAGNOSIS — K219 Gastro-esophageal reflux disease without esophagitis: Secondary | ICD-10-CM | POA: Diagnosis not present

## 2013-03-12 DIAGNOSIS — Z9089 Acquired absence of other organs: Secondary | ICD-10-CM | POA: Insufficient documentation

## 2013-03-12 DIAGNOSIS — R1013 Epigastric pain: Secondary | ICD-10-CM

## 2013-03-12 DIAGNOSIS — M25571 Pain in right ankle and joints of right foot: Secondary | ICD-10-CM

## 2013-03-12 DIAGNOSIS — R110 Nausea: Secondary | ICD-10-CM | POA: Diagnosis not present

## 2013-03-12 DIAGNOSIS — R079 Chest pain, unspecified: Secondary | ICD-10-CM | POA: Diagnosis not present

## 2013-03-12 DIAGNOSIS — M25579 Pain in unspecified ankle and joints of unspecified foot: Secondary | ICD-10-CM | POA: Insufficient documentation

## 2013-03-12 LAB — URINALYSIS, ROUTINE W REFLEX MICROSCOPIC
Bilirubin Urine: NEGATIVE
Glucose, UA: NEGATIVE mg/dL
Hgb urine dipstick: NEGATIVE
Ketones, ur: NEGATIVE mg/dL
Leukocytes, UA: NEGATIVE
Nitrite: NEGATIVE
Protein, ur: NEGATIVE mg/dL
Specific Gravity, Urine: 1.013 (ref 1.005–1.030)
Urobilinogen, UA: 0.2 mg/dL (ref 0.0–1.0)
pH: 7 (ref 5.0–8.0)

## 2013-03-12 LAB — CBC WITH DIFFERENTIAL/PLATELET
Basophils Absolute: 0 10*3/uL (ref 0.0–0.1)
Basophils Relative: 1 % (ref 0–1)
Eosinophils Absolute: 0.1 10*3/uL (ref 0.0–0.7)
Eosinophils Relative: 3 % (ref 0–5)
HCT: 39.8 % (ref 36.0–46.0)
Hemoglobin: 13.7 g/dL (ref 12.0–15.0)
Lymphocytes Relative: 43 % (ref 12–46)
Lymphs Abs: 1.9 10*3/uL (ref 0.7–4.0)
MCH: 28.5 pg (ref 26.0–34.0)
MCHC: 34.4 g/dL (ref 30.0–36.0)
MCV: 82.9 fL (ref 78.0–100.0)
Monocytes Absolute: 0.3 10*3/uL (ref 0.1–1.0)
Monocytes Relative: 8 % (ref 3–12)
Neutro Abs: 2 10*3/uL (ref 1.7–7.7)
Neutrophils Relative %: 47 % (ref 43–77)
Platelets: 186 10*3/uL (ref 150–400)
RBC: 4.8 MIL/uL (ref 3.87–5.11)
RDW: 13.7 % (ref 11.5–15.5)
WBC: 4.4 10*3/uL (ref 4.0–10.5)

## 2013-03-12 LAB — COMPREHENSIVE METABOLIC PANEL
ALT: 25 U/L (ref 0–35)
AST: 21 U/L (ref 0–37)
Albumin: 3.2 g/dL — ABNORMAL LOW (ref 3.5–5.2)
Alkaline Phosphatase: 86 U/L (ref 39–117)
BUN: 12 mg/dL (ref 6–23)
CO2: 29 mEq/L (ref 19–32)
Calcium: 8.7 mg/dL (ref 8.4–10.5)
Chloride: 102 mEq/L (ref 96–112)
Creatinine, Ser: 0.66 mg/dL (ref 0.50–1.10)
GFR calc Af Amer: >90 mL/min (ref >90)
GFR calc non Af Amer: >90 mL/min (ref >90)
Glucose, Bld: 121 mg/dL — ABNORMAL HIGH (ref 70–99)
Potassium: 3.2 mEq/L — ABNORMAL LOW (ref 3.5–5.1)
Sodium: 137 mEq/L (ref 135–145)
Total Bilirubin: 0.2 mg/dL — ABNORMAL LOW (ref 0.3–1.2)
Total Protein: 7.1 g/dL (ref 6.0–8.3)

## 2013-03-12 LAB — LIPASE, BLOOD: Lipase: 21 U/L (ref 11–59)

## 2013-03-12 LAB — TROPONIN I: Troponin I: <0.3 ng/mL (ref <0.30)

## 2013-03-12 MED ORDER — GI COCKTAIL ~~LOC~~
30.0000 mL | Freq: Once | ORAL | Status: AC
  Administered 2013-03-12: 30 mL via ORAL
  Filled 2013-03-12: qty 30

## 2013-03-12 MED ORDER — PANTOPRAZOLE SODIUM 20 MG PO TBEC: 20.0000 mg | DELAYED_RELEASE_TABLET | Freq: Every day | ORAL | Status: DC

## 2013-03-12 MED ORDER — POTASSIUM CHLORIDE CRYS ER 20 MEQ PO TBCR
40.0000 meq | EXTENDED_RELEASE_TABLET | Freq: Once | ORAL | Status: AC
  Administered 2013-03-12: 40 meq via ORAL
  Filled 2013-03-12: qty 2

## 2013-03-12 NOTE — ED Provider Notes (Signed)
CSN: [id number]     Arrival date & time 03/12/13  5366 History     First MD Initiated Contact with Patient 03/12/13 0900     Chief Complaint  Patient presents with  . Abdominal Pain   (Consider location/radiation/quality/duration/timing/severity/associated sxs/prior Treatment) HPI 57 y.o. Female with history of niddm who complins of epigastric pain radiating to the chest which began two days ago.  She has had nausea but no vomiting.  She denies fever, chills, diarrhea, cough.  She states nothing it makes it worse.  She is taking po wihtout difficulty.  She has not taken any meds.  She has some dyspnea.  Patient also complains of right ankle pain without known trauma.  She states the pain is in the medial aspect of her right ankle.  There is no swelling.  The pain is constant.  There is no swelling or history of dvt.   Past Medical History  Diagnosis Date  . Diabetes mellitus   . Hypertension   . Hypercholesteremia   . Hypokalemia   . Diverticulitis    Past Surgical History  Procedure Laterality Date  . Cholecystectomy    . Abdominal hysterectomy     History reviewed. No pertinent family history. History  Substance Use Topics  . Smoking status: Never Smoker   . Smokeless tobacco: Not on file  . Alcohol Use: No   OB History   Grav Para Term Preterm Abortions TAB SAB Ect Mult Living                 Review of Systems  All other systems reviewed and are negative.    Allergies  Aspirin and Latex  Home Medications   Current Outpatient Rx  Name  Route  Sig  Dispense  Refill  . albuterol (PROVENTIL HFA;VENTOLIN HFA) 108 (90 BASE) MCG/ACT inhaler   Inhalation   Inhale 2 puffs into the lungs every 6 (six) hours as needed. For shortness of breath         . conjugated estrogens (PREMARIN) vaginal cream   Vaginal   Place vaginally at bedtime. She uses 1/4 of an applicator.         . furosemide (LASIX) 40 MG tablet   Oral   Take 40 mg by mouth at bedtime.         Marland Kitchen  glimepiride (AMARYL) 2 MG tablet   Oral   Take 2 mg by mouth daily before breakfast.         . HYDROcodone-acetaminophen (NORCO/VICODIN) 5-325 MG per tablet   Oral   Take 1-2 tablets by mouth every 6 (six) hours as needed (For moderate to severe pain.).         Marland Kitchen losartan (COZAAR) 50 MG tablet   Oral   Take 50 mg by mouth every morning.         . methocarbamol (ROBAXIN) 500 MG tablet   Oral   Take 500 mg by mouth 4 (four) times daily as needed (for pain).         . naproxen sodium (ALEVE) 220 MG tablet   Oral   Take 440 mg by mouth every 8 (eight) hours as needed (For pain.).         Marland Kitchen omeprazole (PRILOSEC) 20 MG capsule   Oral   Take 20 mg by mouth daily as needed (For heartburn or acid reflux.).         Marland Kitchen potassium chloride SA (K-DUR,KLOR-CON) 20 MEQ tablet   Oral   Take  20 mEq by mouth every morning.          . promethazine (PHENERGAN) 25 MG tablet   Oral   Take 1 tablet (25 mg total) by mouth every 6 (six) hours as needed for nausea.   20 tablet   0   . rosuvastatin (CRESTOR) 10 MG tablet   Oral   Take 10 mg by mouth at bedtime.          BP 144/93  Pulse 83  Temp(Src) 98 F (36.7 C) (Oral)  Resp 16  SpO2 98% Physical Exam  Nursing note and vitals reviewed. Constitutional: She is oriented to person, place, and time. She appears well-developed and well-nourished.  HENT:  Head: Normocephalic and atraumatic.  Right Ear: External ear normal.  Left Ear: External ear normal.  Nose: Nose normal.  Mouth/Throat: Oropharynx is clear and moist.  Eyes: Conjunctivae and EOM are normal. Pupils are equal, round, and reactive to light.  Neck: Normal range of motion. Neck supple.  Cardiovascular: Normal rate, regular rhythm, normal heart sounds and intact distal pulses.   Pulmonary/Chest: Effort normal and breath sounds normal.  Abdominal: Soft. Bowel sounds are normal.  ttp epigastric area, no masses.   Musculoskeletal: Normal range of motion.        Feet:  Neurological: She is alert and oriented to person, place, and time. She has normal reflexes.  Skin: Skin is warm and dry.  Psychiatric: She has a normal mood and affect. Her behavior is normal. Thought content normal.    ED Course   Procedures (including critical care time)  Labs Reviewed  CBC WITH DIFFERENTIAL  COMPREHENSIVE METABOLIC PANEL  LIPASE, BLOOD  URINALYSIS, ROUTINE W REFLEX MICROSCOPIC  TROPONIN I   No results found. No diagnosis found. Results for orders placed during the hospital encounter of 03/12/13  CBC WITH DIFFERENTIAL      Result Value Range   WBC 4.4  4.0 - 10.5 K/uL   RBC 4.80  3.87 - 5.11 MIL/uL   Hemoglobin 13.7  12.0 - 15.0 g/dL   HCT 78.4  69.6 - 29.5 %   MCV 82.9  78.0 - 100.0 fL   MCH 28.5  26.0 - 34.0 pg   MCHC 34.4  30.0 - 36.0 g/dL   RDW 28.4  13.2 - 44.0 %   Platelets 186  150 - 400 K/uL   Neutrophils Relative % 47  43 - 77 %   Neutro Abs 2.0  1.7 - 7.7 K/uL   Lymphocytes Relative 43  12 - 46 %   Lymphs Abs 1.9  0.7 - 4.0 K/uL   Monocytes Relative 8  3 - 12 %   Monocytes Absolute 0.3  0.1 - 1.0 K/uL   Eosinophils Relative 3  0 - 5 %   Eosinophils Absolute 0.1  0.0 - 0.7 K/uL   Basophils Relative 1  0 - 1 %   Basophils Absolute 0.0  0.0 - 0.1 K/uL  URINALYSIS, ROUTINE W REFLEX MICROSCOPIC      Result Value Range   Color, Urine YELLOW  YELLOW   APPearance CLEAR  CLEAR   Specific Gravity, Urine 1.013  1.005 - 1.030   pH 7.0  5.0 - 8.0   Glucose, UA NEGATIVE  NEGATIVE mg/dL   Hgb urine dipstick NEGATIVE  NEGATIVE   Bilirubin Urine NEGATIVE  NEGATIVE   Ketones, ur NEGATIVE  NEGATIVE mg/dL   Protein, ur NEGATIVE  NEGATIVE mg/dL   Urobilinogen, UA 0.2  0.0 - 1.0  mg/dL   Nitrite NEGATIVE  NEGATIVE   Leukocytes, UA NEGATIVE  NEGATIVE  COMPREHENSIVE METABOLIC PANEL      Result Value Range   Sodium 137  135 - 145 mEq/L   Potassium 3.2 (*) 3.5 - 5.1 mEq/L   Chloride 102  96 - 112 mEq/L   CO2 29  19 - 32 mEq/L   Glucose, Bld 121  (*) 70 - 99 mg/dL   BUN 12  6 - 23 mg/dL   Creatinine, Ser 7.82  0.50 - 1.10 mg/dL   Calcium 8.7  8.4 - 95.6 mg/dL   Total Protein 7.1  6.0 - 8.3 g/dL   Albumin 3.2 (*) 3.5 - 5.2 g/dL   AST 21  0 - 37 U/L   ALT 25  0 - 35 U/L   Alkaline Phosphatase 86  39 - 117 U/L   Total Bilirubin 0.2 (*) 0.3 - 1.2 mg/dL   GFR calc non Af Amer >90  >90 mL/min   GFR calc Af Amer >90  >90 mL/min  LIPASE, BLOOD      Result Value Range   Lipase 21  11 - 59 U/L  TROPONIN I      Result Value Range   Troponin I <0.30  <0.30 ng/mL   Date: 03/12/2013  Rate: 82  Rhythm: normal sinus rhythm  QRS Axis: normal  Intervals: normal  ST/T Wave abnormalities: nonspecific ST changes  Conduction Disutrbances:prwp  Narrative Interpretation:   Old EKG Reviewed: unchanged except rate has decreased by 24 beats/minute    MDM  1-abdominal pain-  Probable gastritis with gerd.   Epigastric with ttp, no evidence of sbo, patient s/p cholecystectomy, labs normal.  No evidence of ischemic bowel disease.  Patient taking po well.  She has component radiating to chest which appears to be reflux although ekg checked and troponin negative.  Plan protonix.  2- ankle pain- no swelling, no history or exam c.w. dvt or trauma.  X-Ashlon Lottman negative.  Plan f/u. 3-hypokalemia- po dose here and f/u pmd for recheck.   Hilario Quarry, MD 03/12/13 [id number]

## 2013-03-12 NOTE — ED Notes (Signed)
MD at bedside. 

## 2013-03-12 NOTE — ED Notes (Signed)
Pt alert, arrives from home, c/o abd pain for 3 days, seen PCP, states f/u with a "stomach exam", describes pain as mid epigastric non radiaiting, resp even un labored, skin pwd

## 2013-03-12 NOTE — ED Notes (Signed)
IV d/c'd, catheter withdrawn intact. Pressure applied to puncture site. Patient tolerated procedure well.

## 2013-03-13 DIAGNOSIS — K219 Gastro-esophageal reflux disease without esophagitis: Secondary | ICD-10-CM | POA: Diagnosis not present

## 2013-03-13 DIAGNOSIS — R1013 Epigastric pain: Secondary | ICD-10-CM | POA: Diagnosis not present

## 2013-03-13 DIAGNOSIS — K3189 Other diseases of stomach and duodenum: Secondary | ICD-10-CM | POA: Diagnosis not present

## 2013-04-10 DIAGNOSIS — R0602 Shortness of breath: Secondary | ICD-10-CM | POA: Diagnosis not present

## 2013-04-10 DIAGNOSIS — I10 Essential (primary) hypertension: Secondary | ICD-10-CM | POA: Diagnosis not present

## 2013-04-10 DIAGNOSIS — R0789 Other chest pain: Secondary | ICD-10-CM | POA: Diagnosis not present

## 2013-04-10 DIAGNOSIS — E119 Type 2 diabetes mellitus without complications: Secondary | ICD-10-CM | POA: Diagnosis not present

## 2013-04-25 DIAGNOSIS — I10 Essential (primary) hypertension: Secondary | ICD-10-CM | POA: Diagnosis not present

## 2013-04-25 DIAGNOSIS — E119 Type 2 diabetes mellitus without complications: Secondary | ICD-10-CM | POA: Diagnosis not present

## 2013-04-25 DIAGNOSIS — R0602 Shortness of breath: Secondary | ICD-10-CM | POA: Diagnosis not present

## 2013-04-25 DIAGNOSIS — R079 Chest pain, unspecified: Secondary | ICD-10-CM | POA: Diagnosis not present

## 2013-05-01 DIAGNOSIS — E1142 Type 2 diabetes mellitus with diabetic polyneuropathy: Secondary | ICD-10-CM | POA: Diagnosis not present

## 2013-05-01 DIAGNOSIS — R0602 Shortness of breath: Secondary | ICD-10-CM | POA: Diagnosis not present

## 2013-05-01 DIAGNOSIS — E119 Type 2 diabetes mellitus without complications: Secondary | ICD-10-CM | POA: Diagnosis not present

## 2013-05-01 DIAGNOSIS — Z23 Encounter for immunization: Secondary | ICD-10-CM | POA: Diagnosis not present

## 2013-05-01 DIAGNOSIS — F3289 Other specified depressive episodes: Secondary | ICD-10-CM | POA: Diagnosis not present

## 2013-05-01 DIAGNOSIS — E785 Hyperlipidemia, unspecified: Secondary | ICD-10-CM | POA: Diagnosis not present

## 2013-05-01 DIAGNOSIS — F329 Major depressive disorder, single episode, unspecified: Secondary | ICD-10-CM | POA: Diagnosis not present

## 2013-05-01 DIAGNOSIS — I10 Essential (primary) hypertension: Secondary | ICD-10-CM | POA: Diagnosis not present

## 2013-05-01 DIAGNOSIS — R609 Edema, unspecified: Secondary | ICD-10-CM | POA: Diagnosis not present

## 2013-05-01 DIAGNOSIS — E559 Vitamin D deficiency, unspecified: Secondary | ICD-10-CM | POA: Diagnosis not present

## 2013-05-01 DIAGNOSIS — R079 Chest pain, unspecified: Secondary | ICD-10-CM | POA: Diagnosis not present

## 2013-05-01 DIAGNOSIS — K219 Gastro-esophageal reflux disease without esophagitis: Secondary | ICD-10-CM | POA: Diagnosis not present

## 2013-05-06 DIAGNOSIS — N3010 Interstitial cystitis (chronic) without hematuria: Secondary | ICD-10-CM | POA: Diagnosis not present

## 2013-05-06 DIAGNOSIS — N949 Unspecified condition associated with female genital organs and menstrual cycle: Secondary | ICD-10-CM | POA: Diagnosis not present

## 2013-05-08 DIAGNOSIS — G4733 Obstructive sleep apnea (adult) (pediatric): Secondary | ICD-10-CM | POA: Diagnosis not present

## 2013-05-10 IMAGING — CR DG CHEST 2V
2 series · 2 of 2 positions shown · non-contrast
Comparison: 10/05/2011

CLINICAL DATA: Chest pain

CHEST - 2 VIEW

[w chest pa]
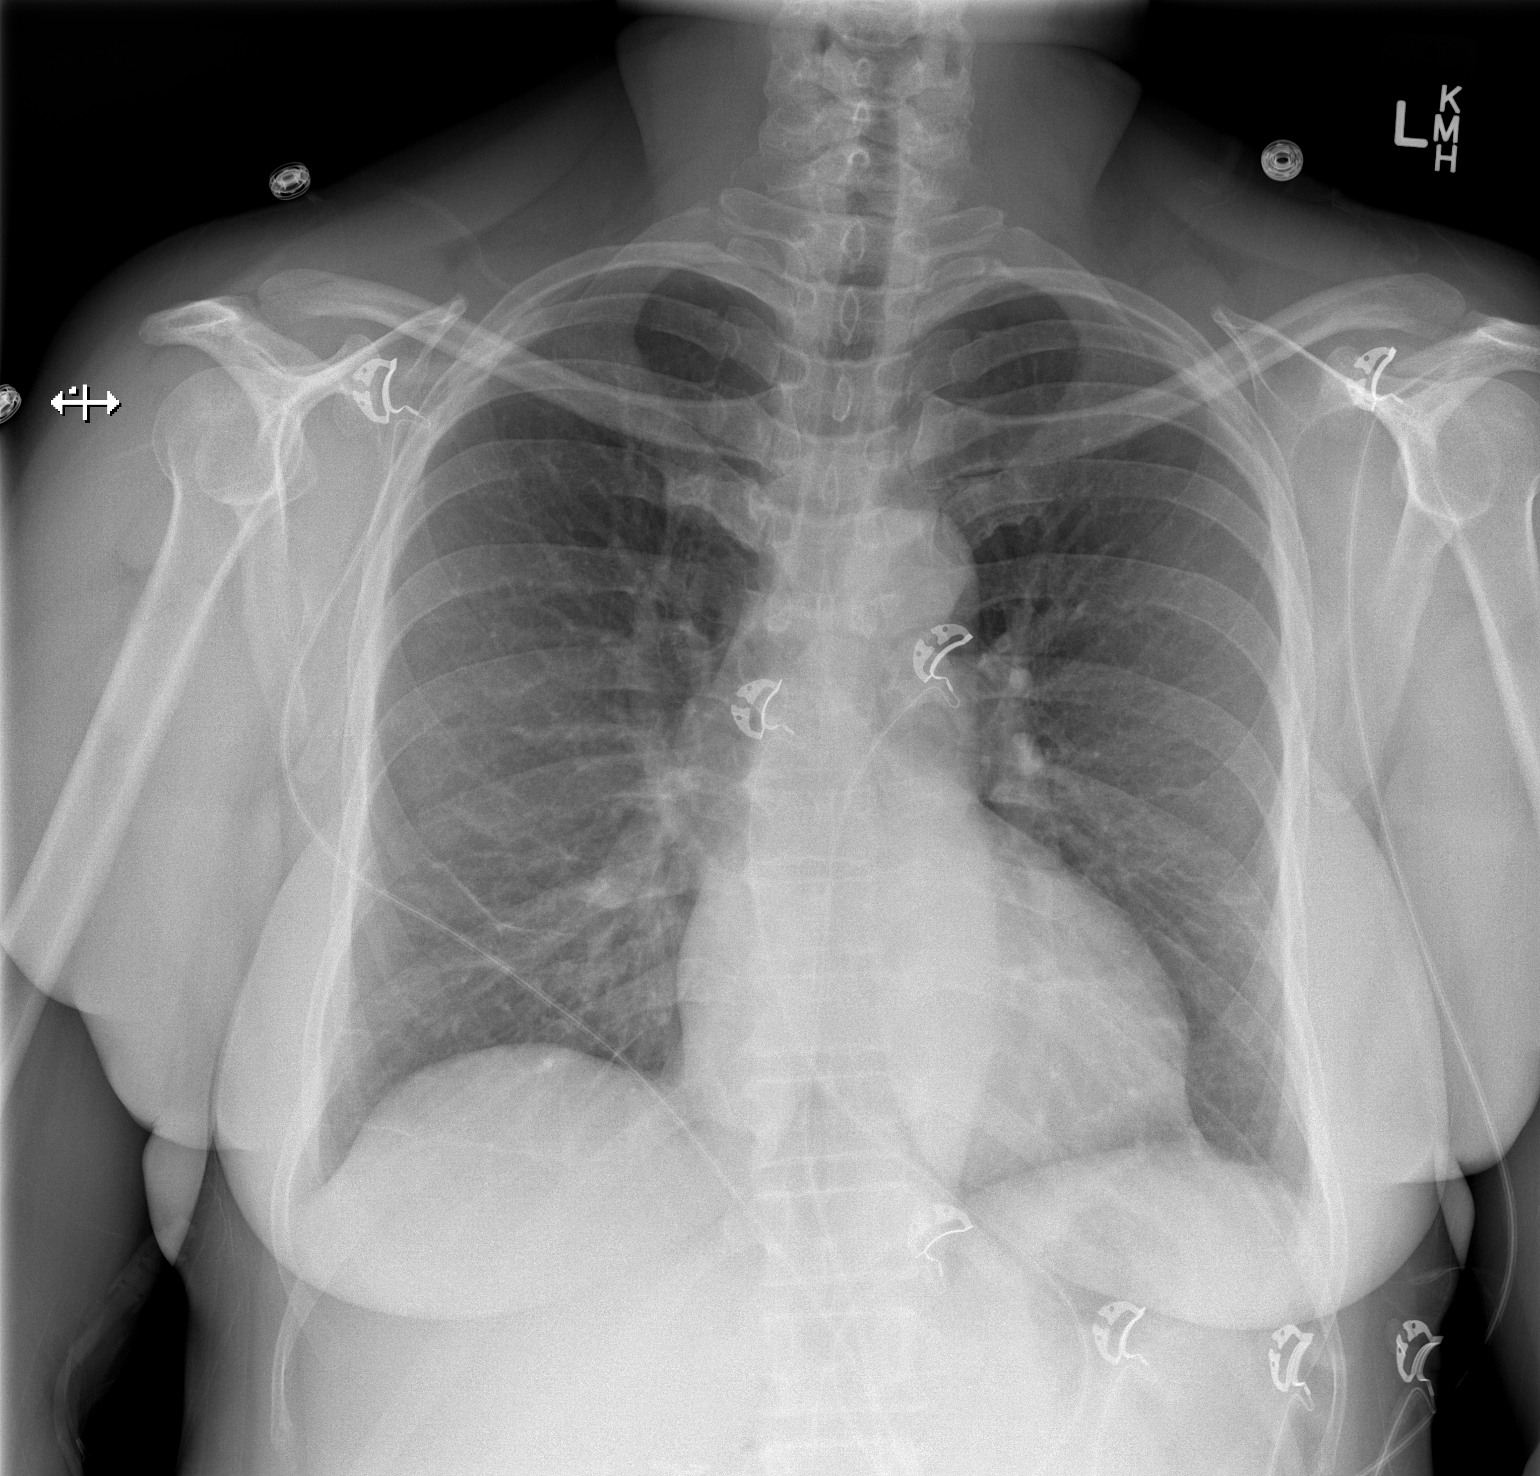

[w chest lat]
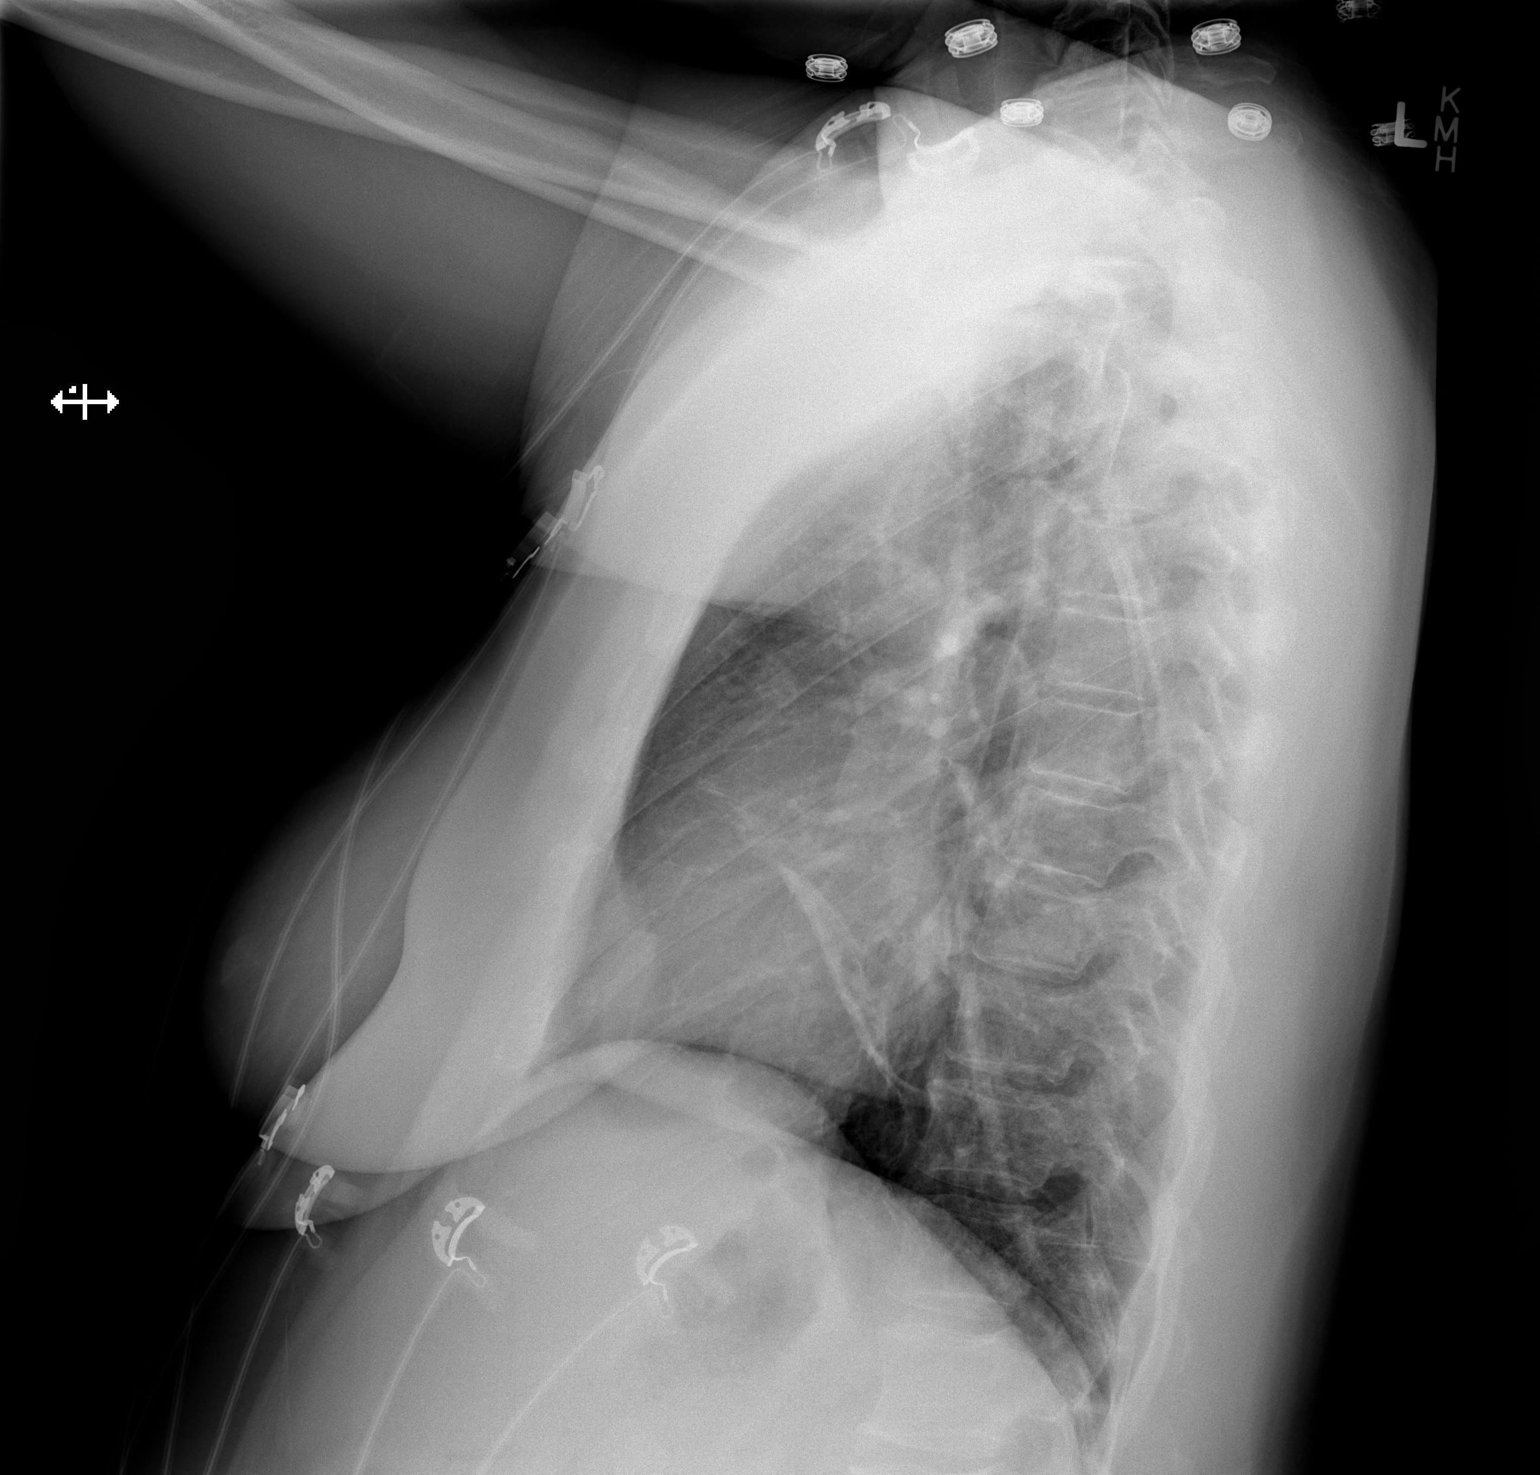

[2 of 2 positions shown; findings below may reference images not displayed]

FINDINGS: Cardiomediastinal silhouette is stable.  No acute
infiltrate or pleural effusion.  No pulmonary edema.  Bony thorax
is stable.
IMPRESSION: No active disease.  No significant change.

## 2013-05-13 DIAGNOSIS — E876 Hypokalemia: Secondary | ICD-10-CM | POA: Diagnosis not present

## 2013-05-13 DIAGNOSIS — F3289 Other specified depressive episodes: Secondary | ICD-10-CM | POA: Diagnosis not present

## 2013-05-13 DIAGNOSIS — K219 Gastro-esophageal reflux disease without esophagitis: Secondary | ICD-10-CM | POA: Diagnosis not present

## 2013-05-13 DIAGNOSIS — F329 Major depressive disorder, single episode, unspecified: Secondary | ICD-10-CM | POA: Diagnosis not present

## 2013-05-13 DIAGNOSIS — E785 Hyperlipidemia, unspecified: Secondary | ICD-10-CM | POA: Diagnosis not present

## 2013-05-13 DIAGNOSIS — E1142 Type 2 diabetes mellitus with diabetic polyneuropathy: Secondary | ICD-10-CM | POA: Diagnosis not present

## 2013-05-13 DIAGNOSIS — I10 Essential (primary) hypertension: Secondary | ICD-10-CM | POA: Diagnosis not present

## 2013-05-13 DIAGNOSIS — E119 Type 2 diabetes mellitus without complications: Secondary | ICD-10-CM | POA: Diagnosis not present

## 2013-05-13 DIAGNOSIS — R609 Edema, unspecified: Secondary | ICD-10-CM | POA: Diagnosis not present

## 2013-05-28 DIAGNOSIS — E669 Obesity, unspecified: Secondary | ICD-10-CM | POA: Diagnosis not present

## 2013-05-28 DIAGNOSIS — G4710 Hypersomnia, unspecified: Secondary | ICD-10-CM | POA: Diagnosis not present

## 2013-06-06 DIAGNOSIS — R0602 Shortness of breath: Secondary | ICD-10-CM | POA: Diagnosis not present

## 2013-06-06 DIAGNOSIS — E119 Type 2 diabetes mellitus without complications: Secondary | ICD-10-CM | POA: Diagnosis not present

## 2013-06-06 DIAGNOSIS — R0789 Other chest pain: Secondary | ICD-10-CM | POA: Diagnosis not present

## 2013-06-06 DIAGNOSIS — I10 Essential (primary) hypertension: Secondary | ICD-10-CM | POA: Diagnosis not present

## 2013-06-14 DIAGNOSIS — G4710 Hypersomnia, unspecified: Secondary | ICD-10-CM | POA: Diagnosis not present

## 2013-06-16 DIAGNOSIS — R609 Edema, unspecified: Secondary | ICD-10-CM | POA: Diagnosis not present

## 2013-06-16 DIAGNOSIS — E119 Type 2 diabetes mellitus without complications: Secondary | ICD-10-CM | POA: Diagnosis not present

## 2013-06-16 DIAGNOSIS — F3289 Other specified depressive episodes: Secondary | ICD-10-CM | POA: Diagnosis not present

## 2013-06-16 DIAGNOSIS — E876 Hypokalemia: Secondary | ICD-10-CM | POA: Diagnosis not present

## 2013-06-16 DIAGNOSIS — E785 Hyperlipidemia, unspecified: Secondary | ICD-10-CM | POA: Diagnosis not present

## 2013-06-16 DIAGNOSIS — E559 Vitamin D deficiency, unspecified: Secondary | ICD-10-CM | POA: Diagnosis not present

## 2013-06-16 DIAGNOSIS — K219 Gastro-esophageal reflux disease without esophagitis: Secondary | ICD-10-CM | POA: Diagnosis not present

## 2013-06-16 DIAGNOSIS — F329 Major depressive disorder, single episode, unspecified: Secondary | ICD-10-CM | POA: Diagnosis not present

## 2013-06-16 DIAGNOSIS — I10 Essential (primary) hypertension: Secondary | ICD-10-CM | POA: Diagnosis not present

## 2013-06-19 DIAGNOSIS — G4710 Hypersomnia, unspecified: Secondary | ICD-10-CM | POA: Diagnosis not present

## 2013-06-19 DIAGNOSIS — E669 Obesity, unspecified: Secondary | ICD-10-CM | POA: Diagnosis not present

## 2013-06-19 DIAGNOSIS — G4733 Obstructive sleep apnea (adult) (pediatric): Secondary | ICD-10-CM | POA: Diagnosis not present

## 2013-06-29 DIAGNOSIS — G4710 Hypersomnia, unspecified: Secondary | ICD-10-CM | POA: Diagnosis not present

## 2013-06-29 DIAGNOSIS — G4733 Obstructive sleep apnea (adult) (pediatric): Secondary | ICD-10-CM | POA: Diagnosis not present

## 2013-07-07 DIAGNOSIS — F329 Major depressive disorder, single episode, unspecified: Secondary | ICD-10-CM | POA: Diagnosis not present

## 2013-07-07 DIAGNOSIS — R609 Edema, unspecified: Secondary | ICD-10-CM | POA: Diagnosis not present

## 2013-07-07 DIAGNOSIS — E1142 Type 2 diabetes mellitus with diabetic polyneuropathy: Secondary | ICD-10-CM | POA: Diagnosis not present

## 2013-07-07 DIAGNOSIS — E119 Type 2 diabetes mellitus without complications: Secondary | ICD-10-CM | POA: Diagnosis not present

## 2013-07-07 DIAGNOSIS — E785 Hyperlipidemia, unspecified: Secondary | ICD-10-CM | POA: Diagnosis not present

## 2013-07-07 DIAGNOSIS — Q828 Other specified congenital malformations of skin: Secondary | ICD-10-CM | POA: Diagnosis not present

## 2013-07-07 DIAGNOSIS — K219 Gastro-esophageal reflux disease without esophagitis: Secondary | ICD-10-CM | POA: Diagnosis not present

## 2013-07-07 DIAGNOSIS — F3289 Other specified depressive episodes: Secondary | ICD-10-CM | POA: Diagnosis not present

## 2013-07-07 DIAGNOSIS — I10 Essential (primary) hypertension: Secondary | ICD-10-CM | POA: Diagnosis not present

## 2013-07-08 ENCOUNTER — Encounter: Payer: Self-pay | Admitting: Advanced Practice Midwife

## 2013-07-08 DIAGNOSIS — G4733 Obstructive sleep apnea (adult) (pediatric): Secondary | ICD-10-CM | POA: Diagnosis not present

## 2013-07-08 DIAGNOSIS — G4710 Hypersomnia, unspecified: Secondary | ICD-10-CM | POA: Diagnosis not present

## 2013-07-08 DIAGNOSIS — E669 Obesity, unspecified: Secondary | ICD-10-CM | POA: Diagnosis not present

## 2013-07-22 ENCOUNTER — Ambulatory Visit (INDEPENDENT_AMBULATORY_CARE_PROVIDER_SITE_OTHER): Payer: Medicare Other | Admitting: Advanced Practice Midwife

## 2013-07-22 ENCOUNTER — Encounter: Payer: Self-pay | Admitting: Advanced Practice Midwife

## 2013-07-22 VITALS — BP 125/80 | HR 86 | Temp 98.1°F | Ht 66.0 in | Wt 185.0 lb

## 2013-07-22 DIAGNOSIS — B373 Candidiasis of vulva and vagina: Secondary | ICD-10-CM | POA: Insufficient documentation

## 2013-07-22 DIAGNOSIS — K5900 Constipation, unspecified: Secondary | ICD-10-CM | POA: Diagnosis not present

## 2013-07-22 DIAGNOSIS — B379 Candidiasis, unspecified: Secondary | ICD-10-CM

## 2013-07-22 DIAGNOSIS — B3731 Acute candidiasis of vulva and vagina: Secondary | ICD-10-CM | POA: Diagnosis not present

## 2013-07-22 DIAGNOSIS — E119 Type 2 diabetes mellitus without complications: Secondary | ICD-10-CM | POA: Diagnosis not present

## 2013-07-22 MED ORDER — RESTORA PO CAPS: 1.0000 | ORAL_CAPSULE | ORAL | Status: DC

## 2013-07-22 NOTE — Progress Notes (Addendum)
  Subjective:    Bonnie Gordon is a 57 y.o. female who presents for chronic yeast infections. Reports she has had recurrent yeast infections since her diagnosis of diabetes 2 years ago. Reports 6 infections in the past year and she is guaranteed and infection after antibiotics. She reports she does not think she had an infection while taking an probiotic but discontinued use to due expense.   Reports she uses sensitive cleaning products and soap. Denies douching. She has the same partner. He is asymptomatic. Reports they use a condom and she reacts with irritation to even latex free condoms.   Currently she does not have any symptoms. Here mostly due to her concern of repeated past infections.   She feels she is not at risk of STI.  Denies pelvic pain, vaginal bleeding. Reports occasional constipation.  Menstrual History: OB History   Grav Para Term Preterm Abortions TAB SAB Ect Mult Living                  Postmenopausal. No LMP recorded. Patient has had a hysterectomy.    The following portions of the patient's history were reviewed and updated as appropriate: allergies, current medications, past family history, past medical history, past social history, past surgical history and problem list.  Review of Systems A comprehensive review of systems was negative.    Objective:    BP 125/80  Pulse 86  Temp(Src) 98.1 F (36.7 C)  Ht 5\' 6"  (1.676 m)  Wt 185 lb (83.915 kg)  BMI 29.87 kg/m2 General:   alert and cooperative   Assessment:     Not at risk of STI 6 yeast infections in past year Patient Active Problem List   Diagnosis Date Noted  . Diabetes 07/22/2013  . Constipation 07/22/2013  . Yeast vaginitis 07/22/2013         Plan:   Probiotic supplement to Pharmacy, also gave OTC recommendation. Reviewed diet management. Reviewed hygiene methods for prevention Consider long term management w/ Diflucan in the future. I recommended a vaginal lubricant for future use to  prevent irritation during IC.  30 min spent with patient greater than 80% spent in counseling and coordination of care.   Lukasz Rogus Wilson Singer CNM

## 2013-07-22 NOTE — Progress Notes (Signed)
Pt in office today with complaint of vaginal irritation increases with intercourse, denies odor states discharge is minimal,  feels like she has a bruise to inner labia, having abd bloating, states stool is hard.

## 2013-08-10 ENCOUNTER — Encounter (HOSPITAL_BASED_OUTPATIENT_CLINIC_OR_DEPARTMENT_OTHER): Payer: Self-pay | Admitting: Emergency Medicine

## 2013-08-10 ENCOUNTER — Emergency Department (HOSPITAL_BASED_OUTPATIENT_CLINIC_OR_DEPARTMENT_OTHER)
Admission: EM | Admit: 2013-08-10 | Discharge: 2013-08-10 | Disposition: A | Discharge status: Home / Self Care | Payer: Medicare Other | Attending: Emergency Medicine | Admitting: Emergency Medicine

## 2013-08-10 DIAGNOSIS — I10 Essential (primary) hypertension: Secondary | ICD-10-CM | POA: Insufficient documentation

## 2013-08-10 DIAGNOSIS — Z91040 Latex allergy status: Secondary | ICD-10-CM | POA: Diagnosis not present

## 2013-08-10 DIAGNOSIS — E876 Hypokalemia: Secondary | ICD-10-CM | POA: Insufficient documentation

## 2013-08-10 DIAGNOSIS — R000 Tachycardia, unspecified: Secondary | ICD-10-CM | POA: Diagnosis not present

## 2013-08-10 DIAGNOSIS — E7800 Pure hypercholesterolemia, unspecified: Secondary | ICD-10-CM | POA: Insufficient documentation

## 2013-08-10 DIAGNOSIS — J3489 Other specified disorders of nose and nasal sinuses: Secondary | ICD-10-CM | POA: Insufficient documentation

## 2013-08-10 DIAGNOSIS — E119 Type 2 diabetes mellitus without complications: Secondary | ICD-10-CM | POA: Insufficient documentation

## 2013-08-10 DIAGNOSIS — H5789 Other specified disorders of eye and adnexa: Secondary | ICD-10-CM | POA: Insufficient documentation

## 2013-08-10 DIAGNOSIS — J020 Streptococcal pharyngitis: Secondary | ICD-10-CM

## 2013-08-10 DIAGNOSIS — Z79899 Other long term (current) drug therapy: Secondary | ICD-10-CM | POA: Diagnosis not present

## 2013-08-10 DIAGNOSIS — Z8719 Personal history of other diseases of the digestive system: Secondary | ICD-10-CM | POA: Insufficient documentation

## 2013-08-10 DIAGNOSIS — IMO0001 Reserved for inherently not codable concepts without codable children: Secondary | ICD-10-CM | POA: Diagnosis not present

## 2013-08-10 DIAGNOSIS — R51 Headache: Secondary | ICD-10-CM | POA: Diagnosis not present

## 2013-08-10 DIAGNOSIS — Z87891 Personal history of nicotine dependence: Secondary | ICD-10-CM | POA: Diagnosis not present

## 2013-08-10 DIAGNOSIS — G4700 Insomnia, unspecified: Secondary | ICD-10-CM | POA: Diagnosis not present

## 2013-08-10 LAB — RAPID STREP SCREEN (MED CTR MEBANE ONLY): Streptococcus, Group A Screen (Direct): POSITIVE — AB

## 2013-08-10 MED ORDER — PENICILLIN V POTASSIUM 500 MG PO TABS: 500.0000 mg | ORAL_TABLET | Freq: Three times a day (TID) | ORAL | Status: DC

## 2013-08-10 MED ORDER — PENICILLIN V POTASSIUM 250 MG PO TABS
500.0000 mg | ORAL_TABLET | Freq: Once | ORAL | Status: AC
  Administered 2013-08-10: 500 mg via ORAL
  Filled 2013-08-10: qty 2

## 2013-08-10 MED ORDER — IBUPROFEN 800 MG PO TABS
800.0000 mg | ORAL_TABLET | Freq: Once | ORAL | Status: AC
  Administered 2013-08-10: 800 mg via ORAL

## 2013-08-10 MED ORDER — IBUPROFEN 800 MG PO TABS
ORAL_TABLET | ORAL | Status: AC
  Filled 2013-08-10: qty 1

## 2013-08-10 MED ORDER — ACETAMINOPHEN 325 MG PO TABS
650.0000 mg | ORAL_TABLET | Freq: Once | ORAL | Status: AC
  Administered 2013-08-10: 650 mg via ORAL
  Filled 2013-08-10: qty 2

## 2013-08-10 NOTE — ED Provider Notes (Signed)
CSN: [id number]     Arrival date & time 08/10/13  1655 History   First MD Initiated Contact with Patient 08/10/13 1752     Chief Complaint  Patient presents with  . Sore Throat   (Consider location/radiation/quality/duration/timing/severity/associated sxs/prior Treatment) Patient is a 58 y.o. female presenting with pharyngitis. The history is provided by the patient.  Sore Throat This is a new problem. The current episode started in the past 7 days. The problem has been gradually worsening. Associated symptoms include chills, a fever, headaches, myalgias, a sore throat and swollen glands. Pertinent negatives include no abdominal pain, anorexia, congestion, coughing, nausea or vomiting. The symptoms are aggravated by swallowing. She has tried nothing for the symptoms.   Alenah Jennette Kettle is a 58 y.o. female who presents to the ED with sore throat and fever x 3 days. Her grandchildren have been sick with cough, cold and congestion but her symptoms are different.   Past Medical History  Diagnosis Date  . Diabetes mellitus   . Hypertension   . Hypokalemia   . Diverticulitis   . Hypercholesteremia    Past Surgical History  Procedure Laterality Date  . Cholecystectomy    . Abdominal hysterectomy    . Appendectomy    . Wisdom tooth extraction     Family History  Problem Relation Age of Onset  . Diabetes Mother   . Gout Mother   . Hypertension Mother   . Arthritis Mother   . Heart disease Father   . Arthritis Sister   . Heart disease Maternal Grandmother   . Diabetes Maternal Grandmother   . Heart disease Paternal Grandmother   . Diabetes Paternal Grandmother    History  Substance Use Topics  . Smoking status: Former Games developer  . Smokeless tobacco: Never Used  . Alcohol Use: No     Comment: occasional / social   OB History   Grav Para Term Preterm Abortions TAB SAB Ect Mult Living                 Review of Systems  Constitutional: Positive for fever and chills.  HENT: Positive  for rhinorrhea and sore throat. Negative for congestion, ear pain, facial swelling and trouble swallowing.   Eyes: Positive for redness. Negative for pain.  Respiratory: Negative for cough and chest tightness.   Gastrointestinal: Negative for nausea, vomiting, abdominal pain and anorexia.  Genitourinary: Negative for urgency and decreased urine volume.  Musculoskeletal: Positive for myalgias.  Neurological: Positive for headaches.  Psychiatric/Behavioral: Negative for confusion. The patient is not nervous/anxious.     Allergies  Aspirin and Latex  Home Medications   Current Outpatient Rx  Name  Route  Sig  Dispense  Refill  . albuterol (PROVENTIL HFA;VENTOLIN HFA) 108 (90 BASE) MCG/ACT inhaler   Inhalation   Inhale 2 puffs into the lungs every 6 (six) hours as needed. For shortness of breath         . conjugated estrogens (PREMARIN) vaginal cream   Vaginal   Place vaginally See admin instructions. She was to use a 1/4 of an applicator for 2 weeks and then twice weekly.         . furosemide (LASIX) 40 MG tablet   Oral   Take 40 mg by mouth at bedtime.         Marland Kitchen glimepiride (AMARYL) 2 MG tablet   Oral   Take 2 mg by mouth daily before breakfast.         . losartan (COZAAR)  50 MG tablet   Oral   Take 50 mg by mouth every morning.         . naproxen sodium (ALEVE) 220 MG tablet   Oral   Take 440 mg by mouth every 8 (eight) hours as needed (For pain.).         Marland Kitchen potassium chloride SA (K-DUR,KLOR-CON) 20 MEQ tablet   Oral   Take 20 mEq by mouth every morning.          . Probiotic Product (RESTORA) CAPS   Oral   Take 1 capsule by mouth 1 day or 1 dose.   30 capsule   12   . promethazine (PHENERGAN) 25 MG tablet   Oral   Take 1 tablet (25 mg total) by mouth every 6 (six) hours as needed for nausea.   20 tablet   0   . rosuvastatin (CRESTOR) 10 MG tablet   Oral   Take 10 mg by mouth at bedtime.          BP 136/83  Pulse 110  Temp(Src) 102.6 F  (39.2 C) (Oral)  Resp 20  Ht 5\' 6"  (1.676 m)  Wt 182 lb (82.555 kg)  BMI 29.39 kg/m2  SpO2 96% Physical Exam  Nursing note and vitals reviewed. Constitutional: She is oriented to person, place, and time. She appears well-developed and well-nourished.  HENT:  Head: Normocephalic.  Right Ear: Tympanic membrane normal.  Left Ear: Tympanic membrane normal.  Nose: Rhinorrhea present.  Mouth/Throat: Uvula is midline and mucous membranes are normal. Oropharyngeal exudate and posterior oropharyngeal erythema present.  Eyes: EOM are normal. Pupils are equal, round, and reactive to light. Right conjunctiva is injected. Left conjunctiva is injected.  Neck: Normal range of motion. Neck supple.  Cardiovascular: Tachycardia present.   Pulmonary/Chest: Effort normal and breath sounds normal.  Abdominal: Soft. Bowel sounds are normal. There is no tenderness.  Musculoskeletal: Normal range of motion.  Lymphadenopathy:    She has cervical adenopathy.  Neurological: She is alert and oriented to person, place, and time. No cranial nerve deficit.  Skin: Skin is warm and dry.  Psychiatric: She has a normal mood and affect. Her behavior is normal.   Results for orders placed during the hospital encounter of 08/10/13 (from the past 24 hour(s))  RAPID STREP SCREEN     Status: Abnormal   Collection Time    08/10/13  5:14 PM      Result Value Range   Streptococcus, Group A Screen (Direct) POSITIVE (*) NEGATIVE    ED Course  Procedures   MDM  58 y.o. female with sore throat and fever. Will treat positive strep with antibiotics. She will continue to treat the fever. Stable for discharge. BP 126/73  Pulse 99  Temp(Src) 101.2 F (38.4 C) (Oral)  Resp 18  Ht 5\' 6"  (1.676 m)  Wt 182 lb (82.555 kg)  BMI 29.39 kg/m2  SpO2 96%  Discussed with the patient and all questioned fully answered. She will return if any problems arise.    Medication List    TAKE these medications       penicillin v  potassium 500 MG tablet  Commonly known as:  VEETID  Take 1 tablet (500 mg total) by mouth 3 (three) times daily.      ASK your doctor about these medications       albuterol 108 (90 BASE) MCG/ACT inhaler  Commonly known as:  PROVENTIL HFA;VENTOLIN HFA  Inhale 2 puffs into the lungs every  6 (six) hours as needed. For shortness of breath     ALEVE 220 MG tablet  Generic drug:  naproxen sodium  Take 440 mg by mouth every 8 (eight) hours as needed (For pain.).     conjugated estrogens vaginal cream  Commonly known as:  PREMARIN  Place vaginally See admin instructions. She was to use a 1/4 of an applicator for 2 weeks and then twice weekly.     furosemide 40 MG tablet  Commonly known as:  LASIX  Take 40 mg by mouth at bedtime.     glimepiride 2 MG tablet  Commonly known as:  AMARYL  Take 2 mg by mouth daily before breakfast.     losartan 50 MG tablet  Commonly known as:  COZAAR  Take 50 mg by mouth every morning.     potassium chloride SA 20 MEQ tablet  Commonly known as:  K-DUR,KLOR-CON  Take 20 mEq by mouth every morning.     promethazine 25 MG tablet  Commonly known as:  PHENERGAN  Take 1 tablet (25 mg total) by mouth every 6 (six) hours as needed for nausea.     RESTORA Caps  Take 1 capsule by mouth 1 day or 1 dose.     rosuvastatin 10 MG tablet  Commonly known as:  CRESTOR  Take 10 mg by mouth at bedtime.           Janne NapoleonHope M Tayvien Kane, [place]NP 08/10/13 1919

## 2013-08-10 NOTE — ED Provider Notes (Signed)
  Medical screening examination/treatment/procedure(s) were performed by non-physician practitioner and as supervising physician I was immediately available for consultation/collaboration.  EKG Interpretation   None          Evalyn Shultis, MD 08/10/13 2245 

## 2013-08-10 NOTE — ED Notes (Signed)
Patient with c/o sore throat since Friday and chills.  Patient also c/o headache as well.

## 2013-08-11 ENCOUNTER — Encounter (HOSPITAL_COMMUNITY): Payer: Self-pay | Admitting: Emergency Medicine

## 2013-08-11 ENCOUNTER — Emergency Department (HOSPITAL_COMMUNITY)
Admission: EM | Admit: 2013-08-11 | Discharge: 2013-08-11 | Disposition: A | Discharge status: Home / Self Care | Payer: Medicare Other | Attending: Emergency Medicine | Admitting: Emergency Medicine

## 2013-08-11 DIAGNOSIS — E7800 Pure hypercholesterolemia, unspecified: Secondary | ICD-10-CM | POA: Insufficient documentation

## 2013-08-11 DIAGNOSIS — I10 Essential (primary) hypertension: Secondary | ICD-10-CM | POA: Diagnosis not present

## 2013-08-11 DIAGNOSIS — Z91040 Latex allergy status: Secondary | ICD-10-CM | POA: Insufficient documentation

## 2013-08-11 DIAGNOSIS — J020 Streptococcal pharyngitis: Secondary | ICD-10-CM | POA: Insufficient documentation

## 2013-08-11 DIAGNOSIS — E876 Hypokalemia: Secondary | ICD-10-CM | POA: Insufficient documentation

## 2013-08-11 DIAGNOSIS — E119 Type 2 diabetes mellitus without complications: Secondary | ICD-10-CM | POA: Diagnosis not present

## 2013-08-11 DIAGNOSIS — G4700 Insomnia, unspecified: Secondary | ICD-10-CM | POA: Insufficient documentation

## 2013-08-11 DIAGNOSIS — Z87891 Personal history of nicotine dependence: Secondary | ICD-10-CM | POA: Diagnosis not present

## 2013-08-11 DIAGNOSIS — Z79899 Other long term (current) drug therapy: Secondary | ICD-10-CM | POA: Insufficient documentation

## 2013-08-11 DIAGNOSIS — Z792 Long term (current) use of antibiotics: Secondary | ICD-10-CM | POA: Diagnosis not present

## 2013-08-11 DIAGNOSIS — Z8719 Personal history of other diseases of the digestive system: Secondary | ICD-10-CM | POA: Diagnosis not present

## 2013-08-11 MED ORDER — DIPHENHYDRAMINE HCL 25 MG PO TABS: 25.0000 mg | ORAL_TABLET | Freq: Four times a day (QID) | ORAL | Status: DC | PRN

## 2013-08-11 MED ORDER — PENICILLIN G BENZATHINE 1200000 UNIT/2ML IM SUSP
1.2000 10*6.[IU] | Freq: Once | INTRAMUSCULAR | Status: AC
  Administered 2013-08-11: 1.2 10*6.[IU] via INTRAMUSCULAR
  Filled 2013-08-11: qty 2

## 2013-08-11 NOTE — ED Notes (Signed)
Pt c/o discomfort in neck/ throat and eyes and soreness in chest. She states she did not have rx filled b/c she did not know of a 24 hour [facility] pharmacy.

## 2013-08-11 NOTE — Discharge Instructions (Signed)
Insomnia Insomnia means you have trouble falling or staying asleep. It affects about one person in three at different times and is usually related to stress from work, school, or personal relations. Insomnia is also a sign of depression or anxiety. Other medical problems that cause insomnia include conditions that cause pain, night leg cramps, coughing, shortness of breath, urinary problems, and fevers. Sleep apnea is an abnormal breathing pattern at night that can cause insomnia and loud snoring. Certain medications and excess intake of caffeine drinks (coffee, tea, colas) can also interfere with normal sleep. Treatment for insomnia depends on the cause. Besides specific medical treatment, the following measures can help you relax and get better sleep. Get regular exercise every day, at least several hours before bed time. Try to get to bed at the same time every night. Take a hot bath before retiring to help you relax. Do not stay in bed if you are unable to sleep. During the daytime avoid staying in bed to watch television, eat, or read. Reduce unwanted noise and light in your room. Keep your room at a comfortable temperature. Avoid alcohol as it causes one to sleep less soundly, may cause you to awaken during the night, and can leave you feeling groggy the next day. Using a mild sedative prescribed or suggested by your caregiver may be needed, but the daily use of sleeping pills is not recommended. Anti-depressant medicines can improve sleep in people with depression. Please call your doctor for follow up care to better understand the cause and proper treatment of your insomnia. Document Released: 08/31/2004 Document Revised: 10/16/2011 Document Reviewed: 07/24/2005 [facility] Patient Information 2014 [place], [place]. Strep Throat Strep throat is an infection of the throat caused by a bacteria named Streptococcus pyogenes. Your caregiver may call the infection streptococcal "tonsillitis" or "pharyngitis"  depending on whether there are signs of inflammation in the tonsils or back of the throat. Strep throat is most common in children aged 5 15 years during the cold months of the year, but it can occur in people of any age during any season. This infection is spread from person to person (contagious) through coughing, sneezing, or other close contact. SYMPTOMS   Fever or chills.  Painful, swollen, red tonsils or throat.  Pain or difficulty when swallowing.  White or yellow spots on the tonsils or throat.  Swollen, tender lymph nodes or "glands" of the neck or under the jaw.  Red rash all over the body (rare). DIAGNOSIS  Many different infections can cause the same symptoms. A test must be done to confirm the diagnosis so the right treatment can be given. A "rapid strep test" can help your caregiver make the diagnosis in a few minutes. If this test is not available, a light swab of the infected area can be used for a throat culture test. If a throat culture test is done, results are usually available in a day or two. TREATMENT  Strep throat is treated with antibiotic medicine. HOME CARE INSTRUCTIONS   Gargle with 1 tsp of salt in 1 cup of warm water, 3 4 times per day or as needed for comfort.  Family members who also have a sore throat or fever should be tested for strep throat and treated with antibiotics if they have the strep infection.  Make sure everyone in your household washes their hands well.  Do not share food, drinking cups, or personal items that could cause the infection to spread to others.  You may need to  eat a soft food diet until your sore throat gets better.  Drink enough water and fluids to keep your urine clear or pale yellow. This will help prevent dehydration.  Get plenty of rest.  Stay home from school, daycare, or work until you have been on antibiotics for 24 hours.  Only take over-the-counter or prescription medicines for pain, discomfort, or fever as  directed by your caregiver.  If antibiotics are prescribed, take them as directed. Finish them even if you start to feel better. SEEK MEDICAL CARE IF:   The glands in your neck continue to enlarge.  You develop a rash, cough, or earache.  You cough up green, yellow-brown, or bloody sputum.  You have pain or discomfort not controlled by medicines.  Your problems seem to be getting worse rather than better. SEEK IMMEDIATE MEDICAL CARE IF:   You develop any new symptoms such as vomiting, severe headache, stiff or painful neck, chest pain, shortness of breath, or trouble swallowing.  You develop severe throat pain, drooling, or changes in your voice.  You develop swelling of the neck, or the skin on the neck becomes red and tender.  You have a fever.  You develop signs of dehydration, such as fatigue, dry mouth, and decreased urination.  You become increasingly sleepy, or you cannot wake up completely. Document Released: 07/21/2000 Document Revised: 07/10/2012 Document Reviewed: 09/22/2010 [facility] Patient Information 2014 [place], [place].

## 2013-08-11 NOTE — ED Provider Notes (Signed)
CSN: [id number]     Arrival date & time 08/11/13  0359 History   First MD Initiated Contact with Patient 08/11/13 0440     Chief Complaint  Patient presents with  . Insomnia   (Consider location/radiation/quality/duration/timing/severity/associated sxs/prior Treatment) HPI  This is a 58 year old female with history of diabetes, hypertension who presents with insomnia. Patient was diagnosed less than 12 hours ago with strep pharyngitis. Upon discharge, patient went home and states that she was unable to sleep all night. She denies any significant pain. She's not tried anything at home included Motrin, Tylenol, or Benadryl. She has not filled her prescription for penicillin.  Patient has no other complaints except for that she cannot sleep.  Past Medical History  Diagnosis Date  . Diabetes mellitus   . Hypertension   . Hypokalemia   . Diverticulitis   . Hypercholesteremia    Past Surgical History  Procedure Laterality Date  . Cholecystectomy    . Abdominal hysterectomy    . Appendectomy    . Wisdom tooth extraction     Family History  Problem Relation Age of Onset  . Diabetes Mother   . Gout Mother   . Hypertension Mother   . Arthritis Mother   . Heart disease Father   . Arthritis Sister   . Heart disease Maternal Grandmother   . Diabetes Maternal Grandmother   . Heart disease Paternal Grandmother   . Diabetes Paternal Grandmother    History  Substance Use Topics  . Smoking status: Former Games developer  . Smokeless tobacco: Never Used  . Alcohol Use: Yes     Comment: occasional / social   OB History   Grav Para Term Preterm Abortions TAB SAB Ect Mult Living                 Review of Systems  Constitutional: Negative for fever.  HENT: Positive for sore throat.   Respiratory: Negative for cough, chest tightness and shortness of breath.   Cardiovascular: Negative for chest pain.  Gastrointestinal: Negative for nausea, vomiting and abdominal pain.  Genitourinary: Negative  for dysuria.  Skin: Negative for wound.  Neurological: Negative for headaches.  All other systems reviewed and are negative.    Allergies  Aspirin and Latex  Home Medications   Current Outpatient Rx  Name  Route  Sig  Dispense  Refill  . conjugated estrogens (PREMARIN) vaginal cream   Vaginal   Place vaginally See admin instructions. She was to use a 1/4 of an applicator for 2 weeks and then twice weekly.         . diphenhydrAMINE (BENADRYL) 25 MG tablet   Oral   Take 25 mg by mouth every 6 (six) hours as needed (sleep).         Marland Kitchen glimepiride (AMARYL) 2 MG tablet   Oral   Take 2 mg by mouth daily before breakfast.         . losartan (COZAAR) 50 MG tablet   Oral   Take 50 mg by mouth every morning.         . naproxen sodium (ALEVE) 220 MG tablet   Oral   Take 440 mg by mouth every 8 (eight) hours as needed (For pain.).         Marland Kitchen potassium chloride SA (K-DUR,KLOR-CON) 20 MEQ tablet   Oral   Take 20 mEq by mouth every morning.          . rosuvastatin (CRESTOR) 10 MG tablet   Oral  Take 10 mg by mouth at bedtime.         . diphenhydrAMINE (BENADRYL) 25 MG tablet   Oral   Take 1 tablet (25 mg total) by mouth every 6 (six) hours as needed for sleep.   20 tablet   0   . penicillin v potassium (VEETID) 500 MG tablet   Oral   Take 1 tablet (500 mg total) by mouth 3 (three) times daily.   30 tablet   0    BP 137/86  Pulse 92  Temp(Src) 97.8 F (36.6 C) (Oral)  Resp 15  Ht 5\' 6"  (1.676 m)  Wt 182 lb (82.555 kg)  BMI 29.39 kg/m2  SpO2 97% Physical Exam  Nursing note and vitals reviewed. Constitutional: She is oriented to person, place, and time. She appears well-developed and well-nourished. No distress.  HENT:  Head: Normocephalic and atraumatic.  Erythema to the posterior oropharynx  Eyes: Pupils are equal, round, and reactive to light.  Neck: Neck supple.  Cardiovascular: Normal rate and regular rhythm.   Pulmonary/Chest: Effort normal.  No respiratory distress.  Abdominal: Soft. Bowel sounds are normal.  Lymphadenopathy:    She has cervical adenopathy.  Neurological: She is alert and oriented to person, place, and time.  Skin: Skin is warm and dry.  Psychiatric: She has a normal mood and affect.    ED Course  Procedures (including critical care time) Labs Review Labs Reviewed - No data to display Imaging Review No results found.  EKG Interpretation   None       MDM   1. Strep pharyngitis   2. Insomnia    Patient presents with inability to sleep following diagnosis of strep pharyngitis last night. I have reviewed the patient's chart and she had a positive strep screen yesterday. She denies any significant pain right now just inability to sleep. She's not tried anything at home. She denies any other symptoms. I encouraged the patient to use Benadryl to assist in sleeping. She's not to drive or operate heavy machinery under the influence of Benadryl. I also offered to give patient Bicillin as a substitute for by mouth penicillin which she has not gotten filled.  Patient was given Bicillin and instructed not to fill her penicillin prescription.  After history, exam, and medical workup I feel the patient has been appropriately medically screened and is safe for discharge home. Pertinent diagnoses were discussed with the patient. Patient was given return precautions.    Shon Batonourtney F Jahmai Finelli, MD 08/11/13 [phone]

## 2013-08-11 NOTE — ED Notes (Signed)
Pt states she is unable to sleep tonight. Pt was [facility]. Pt did not get script filled and has not taken APAP or Motrin for HA. A & O, NAD

## 2013-09-03 DIAGNOSIS — E669 Obesity, unspecified: Secondary | ICD-10-CM | POA: Diagnosis not present

## 2013-09-03 DIAGNOSIS — G4733 Obstructive sleep apnea (adult) (pediatric): Secondary | ICD-10-CM | POA: Diagnosis not present

## 2013-09-03 DIAGNOSIS — G4710 Hypersomnia, unspecified: Secondary | ICD-10-CM | POA: Diagnosis not present

## 2013-09-04 ENCOUNTER — Emergency Department (HOSPITAL_COMMUNITY)
Admission: EM | Admit: 2013-09-04 | Discharge: 2013-09-04 | Disposition: A | Discharge status: Home / Self Care | Payer: Medicare Other | Attending: Emergency Medicine | Admitting: Emergency Medicine

## 2013-09-04 ENCOUNTER — Encounter (HOSPITAL_COMMUNITY): Payer: Self-pay | Admitting: Emergency Medicine

## 2013-09-04 DIAGNOSIS — Z792 Long term (current) use of antibiotics: Secondary | ICD-10-CM | POA: Diagnosis not present

## 2013-09-04 DIAGNOSIS — E669 Obesity, unspecified: Secondary | ICD-10-CM | POA: Diagnosis not present

## 2013-09-04 DIAGNOSIS — K625 Hemorrhage of anus and rectum: Secondary | ICD-10-CM | POA: Diagnosis not present

## 2013-09-04 DIAGNOSIS — E7800 Pure hypercholesterolemia, unspecified: Secondary | ICD-10-CM | POA: Insufficient documentation

## 2013-09-04 DIAGNOSIS — R079 Chest pain, unspecified: Secondary | ICD-10-CM | POA: Diagnosis not present

## 2013-09-04 DIAGNOSIS — K5792 Diverticulitis of intestine, part unspecified, without perforation or abscess without bleeding: Secondary | ICD-10-CM

## 2013-09-04 DIAGNOSIS — I10 Essential (primary) hypertension: Secondary | ICD-10-CM | POA: Diagnosis not present

## 2013-09-04 DIAGNOSIS — R42 Dizziness and giddiness: Secondary | ICD-10-CM | POA: Insufficient documentation

## 2013-09-04 DIAGNOSIS — Z90710 Acquired absence of both cervix and uterus: Secondary | ICD-10-CM | POA: Diagnosis not present

## 2013-09-04 DIAGNOSIS — Z79899 Other long term (current) drug therapy: Secondary | ICD-10-CM | POA: Diagnosis not present

## 2013-09-04 DIAGNOSIS — M549 Dorsalgia, unspecified: Secondary | ICD-10-CM | POA: Diagnosis not present

## 2013-09-04 DIAGNOSIS — E119 Type 2 diabetes mellitus without complications: Secondary | ICD-10-CM | POA: Diagnosis not present

## 2013-09-04 DIAGNOSIS — Z91040 Latex allergy status: Secondary | ICD-10-CM | POA: Diagnosis not present

## 2013-09-04 DIAGNOSIS — K5732 Diverticulitis of large intestine without perforation or abscess without bleeding: Secondary | ICD-10-CM | POA: Diagnosis not present

## 2013-09-04 DIAGNOSIS — Z87891 Personal history of nicotine dependence: Secondary | ICD-10-CM | POA: Diagnosis not present

## 2013-09-04 DIAGNOSIS — Z9089 Acquired absence of other organs: Secondary | ICD-10-CM | POA: Insufficient documentation

## 2013-09-04 DIAGNOSIS — G4710 Hypersomnia, unspecified: Secondary | ICD-10-CM | POA: Diagnosis not present

## 2013-09-04 DIAGNOSIS — G4733 Obstructive sleep apnea (adult) (pediatric): Secondary | ICD-10-CM | POA: Diagnosis not present

## 2013-09-04 LAB — COMPREHENSIVE METABOLIC PANEL
ALT: 23 U/L (ref 0–35)
AST: 21 U/L (ref 0–37)
Albumin: 3.6 g/dL (ref 3.5–5.2)
Alkaline Phosphatase: 104 U/L (ref 39–117)
BUN: 10 mg/dL (ref 6–23)
CO2: 29 mEq/L (ref 19–32)
Calcium: 9.1 mg/dL (ref 8.4–10.5)
Chloride: 100 mEq/L (ref 96–112)
Creatinine, Ser: 0.65 mg/dL (ref 0.50–1.10)
GFR calc Af Amer: >90 mL/min (ref >90)
GFR calc non Af Amer: >90 mL/min (ref >90)
Glucose, Bld: 109 mg/dL — ABNORMAL HIGH (ref 70–99)
Potassium: 4.3 mEq/L (ref 3.7–5.3)
Sodium: 140 mEq/L (ref 137–147)
Total Bilirubin: <0.2 mg/dL — ABNORMAL LOW (ref 0.3–1.2)
Total Protein: 8 g/dL (ref 6.0–8.3)

## 2013-09-04 LAB — CBC WITH DIFFERENTIAL/PLATELET
Basophils Absolute: 0.1 10*3/uL (ref 0.0–0.1)
Basophils Absolute: 0 10*3/uL (ref 0.0–0.1)
Basophils Relative: 1 % (ref 0–1)
Basophils Relative: 1 % (ref 0–1)
Eosinophils Absolute: 0.5 10*3/uL (ref 0.0–0.7)
Eosinophils Absolute: 0.2 10*3/uL (ref 0.0–0.7)
Eosinophils Relative: 9 % — ABNORMAL HIGH (ref 0–5)
Eosinophils Relative: 4 % (ref 0–5)
HCT: 41.9 % (ref 36.0–46.0)
HCT: 39.1 % (ref 36.0–46.0)
Hemoglobin: 14.1 g/dL (ref 12.0–15.0)
Hemoglobin: 13.2 g/dL (ref 12.0–15.0)
Lymphocytes Relative: 42 % (ref 12–46)
Lymphocytes Relative: 48 % — ABNORMAL HIGH (ref 12–46)
Lymphs Abs: 2.3 10*3/uL (ref 0.7–4.0)
Lymphs Abs: 2.4 10*3/uL (ref 0.7–4.0)
MCH: 29.1 pg (ref 26.0–34.0)
MCH: 29 pg (ref 26.0–34.0)
MCHC: 33.7 g/dL (ref 30.0–36.0)
MCHC: 33.8 g/dL (ref 30.0–36.0)
MCV: 86.4 fL (ref 78.0–100.0)
MCV: 85.9 fL (ref 78.0–100.0)
Monocytes Absolute: 0.3 10*3/uL (ref 0.1–1.0)
Monocytes Absolute: 0.4 10*3/uL (ref 0.1–1.0)
Monocytes Relative: 6 % (ref 3–12)
Monocytes Relative: 8 % (ref 3–12)
Neutro Abs: 2.3 10*3/uL (ref 1.7–7.7)
Neutro Abs: 2 10*3/uL (ref 1.7–7.7)
Neutrophils Relative %: 42 % — ABNORMAL LOW (ref 43–77)
Neutrophils Relative %: 39 % — ABNORMAL LOW (ref 43–77)
Platelets: ADEQUATE 10*3/uL (ref 150–400)
Platelets: 189 10*3/uL (ref 150–400)
RBC: 4.85 MIL/uL (ref 3.87–5.11)
RBC: 4.55 MIL/uL (ref 3.87–5.11)
RDW: 14 % (ref 11.5–15.5)
RDW: 13.8 % (ref 11.5–15.5)
Smear Review: ADEQUATE
WBC: 5.5 10*3/uL (ref 4.0–10.5)
WBC: 5 10*3/uL (ref 4.0–10.5)

## 2013-09-04 LAB — URINALYSIS, ROUTINE W REFLEX MICROSCOPIC
Bilirubin Urine: NEGATIVE
Glucose, UA: NEGATIVE mg/dL
Hgb urine dipstick: NEGATIVE
Ketones, ur: NEGATIVE mg/dL
Leukocytes, UA: NEGATIVE
Nitrite: NEGATIVE
Protein, ur: NEGATIVE mg/dL
Specific Gravity, Urine: 1.014 (ref 1.005–1.030)
Urobilinogen, UA: 0.2 mg/dL (ref 0.0–1.0)
pH: 7 (ref 5.0–8.0)

## 2013-09-04 LAB — LIPASE, BLOOD: Lipase: 21 U/L (ref 11–59)

## 2013-09-04 LAB — TROPONIN I
Troponin I: <0.3 ng/mL (ref <0.30)
Troponin I: <0.3 ng/mL (ref <0.30)

## 2013-09-04 LAB — GLUCOSE, CAPILLARY
Glucose-Capillary: 50 mg/dL — ABNORMAL LOW (ref 70–99)
Glucose-Capillary: 142 mg/dL — ABNORMAL HIGH (ref 70–99)

## 2013-09-04 LAB — OCCULT BLOOD, POC DEVICE: Fecal Occult Bld: POSITIVE — AB

## 2013-09-04 MED ORDER — SODIUM CHLORIDE 0.9 % IV BOLUS (SEPSIS)
1000.0000 mL | Freq: Once | INTRAVENOUS | Status: AC
  Administered 2013-09-04: 1000 mL via INTRAVENOUS

## 2013-09-04 MED ORDER — METRONIDAZOLE 500 MG PO TABS
500.0000 mg | ORAL_TABLET | Freq: Once | ORAL | Status: AC
  Administered 2013-09-04: 500 mg via ORAL
  Filled 2013-09-04: qty 1

## 2013-09-04 MED ORDER — ONDANSETRON HCL 4 MG PO TABS: 4.0000 mg | ORAL_TABLET | Freq: Four times a day (QID) | ORAL | Status: DC

## 2013-09-04 MED ORDER — CIPROFLOXACIN HCL 500 MG PO TABS: 500.0000 mg | ORAL_TABLET | Freq: Two times a day (BID) | ORAL | Status: DC

## 2013-09-04 MED ORDER — GI COCKTAIL ~~LOC~~
30.0000 mL | Freq: Once | ORAL | Status: AC
  Administered 2013-09-04: 30 mL via ORAL
  Filled 2013-09-04: qty 30

## 2013-09-04 MED ORDER — CIPROFLOXACIN HCL 500 MG PO TABS
500.0000 mg | ORAL_TABLET | Freq: Once | ORAL | Status: AC
  Administered 2013-09-04: 500 mg via ORAL
  Filled 2013-09-04: qty 1

## 2013-09-04 MED ORDER — METRONIDAZOLE 500 MG PO TABS: 500.0000 mg | ORAL_TABLET | Freq: Three times a day (TID) | ORAL | Status: DC

## 2013-09-04 NOTE — ED Notes (Signed)
Pt given more water, remains unable to urinate

## 2013-09-04 NOTE — ED Notes (Signed)
Pt stated that she will call out when she is ready to provide urine sample

## 2013-09-04 NOTE — Discharge Instructions (Signed)
Please call your doctor for a followup appointment within 24-48 hours. When you talk to your doctor please let them know that you were seen in the emergency department and have them acquire all of your records so that they can discuss the findings with you and formulate a treatment plan to fully care for your new and ongoing problems. Please call for an appointment with primary care provider to be reassessed within 24-40 hours Please call and set-up an appointment with gastroenterologist Please rest and stay hydrated Please take with a clear liquid diet Please take medications as prescribed Please continue to monitor symptoms closely and if symptoms are to worsen or change (fever greater than 101, chills, neck pain, neck stiffness, chest pain, shortness of breath, difficulty breathing, worsening abdominal pain, inability to keep food or fluids down) please report back to emergency department immediately    Diverticulitis A diverticulum is a small pouch or sac on the colon. Diverticulosis is the presence of these diverticula on the colon. Diverticulitis is the irritation (inflammation) or infection of diverticula. CAUSES  The colon and its diverticula contain bacteria. If food particles block the tiny opening to a diverticulum, the bacteria inside can grow and cause an increase in pressure. This leads to infection and inflammation and is called diverticulitis. SYMPTOMS   Abdominal pain and tenderness. Usually, the pain is located on the left side of your abdomen. However, it could be located elsewhere.  Fever.  Bloating.  Feeling sick to your stomach (nausea).  Throwing up (vomiting).  Abnormal stools. DIAGNOSIS  Your caregiver will take a history and perform a physical exam. Since many things can cause abdominal pain, other tests may be necessary. Tests may include:  Blood tests.  Urine tests.  X-ray of the abdomen.  CT scan of the abdomen. Sometimes, surgery is needed to  determine if diverticulitis or other conditions are causing your symptoms. TREATMENT  Most of the time, you can be treated without surgery. Treatment includes:  Resting the bowels by only having liquids for a few days. As you improve, you will need to eat a low-fiber diet.  Intravenous (IV) fluids if you are losing body fluids (dehydrated).  Antibiotic medicines that treat infections may be given.  Pain and nausea medicine, if needed.  Surgery if the inflamed diverticulum has burst. HOME CARE INSTRUCTIONS   Try a clear liquid diet (broth, tea, or water for as long as directed by your caregiver). You may then gradually begin a low-fiber diet as tolerated.  A low-fiber diet is a diet with less than 10 grams of fiber. Choose the foods below to reduce fiber in the diet:  White breads, cereals, rice, and pasta.  Cooked fruits and vegetables or soft fresh fruits and vegetables without the skin.  Ground or well-cooked tender beef, ham, veal, lamb, pork, or poultry.  Eggs and seafood.  After your diverticulitis symptoms have improved, your caregiver may put you on a high-fiber diet. A high-fiber diet includes 14 grams of fiber for every 1000 calories consumed. For a standard 2000 calorie diet, you would need 28 grams of fiber. Follow these diet guidelines to help you increase the fiber in your diet. It is important to slowly increase the amount fiber in your diet to avoid gas, constipation, and bloating.  Choose whole-grain breads, cereals, pasta, and brown rice.  Choose fresh fruits and vegetables with the skin on. Do not overcook vegetables because the more vegetables are cooked, the more fiber is lost.  Choose more  nuts, seeds, legumes, dried peas, beans, and lentils.  Look for food products that have greater than 3 grams of fiber per serving on the Nutrition Facts label.  Take all medicine as directed by your caregiver.  If your caregiver has given you a follow-up appointment, it is  very important that you go. Not going could result in lasting (chronic) or permanent injury, pain, and disability. If there is any problem keeping the appointment, call to reschedule. SEEK MEDICAL CARE IF:   Your pain does not improve.  You have a hard time advancing your diet beyond clear liquids.  Your bowel movements do not return to normal. SEEK IMMEDIATE MEDICAL CARE IF:   Your pain becomes worse.  You have an oral temperature above 102 F (38.9 C), not controlled by medicine.  You have repeated vomiting.  You have bloody or black, tarry stools.  Symptoms that brought you to your caregiver become worse or are not getting better. MAKE SURE YOU:   Understand these instructions.  Will watch your condition.  Will get help right away if you are not doing well or get worse. Document Released: 05/03/2005 Document Revised: 10/16/2011 Document Reviewed: 08/29/2010 [facility] Patient Information 2014 [place], [place].  Diet The clear liquid diet consists of foods that are liquid or will become liquid at room temperature. Examples of foods allowed on a clear liquid diet include fruit juice, broth or bouillon, gelatin, or frozen ice pops. You should be able to see through the liquid. The purpose of this diet is to provide the necessary fluids, electrolytes (such as sodium and potassium), and energy to keep the body functioning during times when you are not able to consume a regular diet. A clear liquid diet should not be continued for long periods of time, as it is not nutritionally adequate.  A CLEAR LIQUID DIET MAY BE NEEDED:  When a sudden-onset (acute) condition occurs before or after surgery.   As the first step in oral feeding.   For fluid and electrolyte replacement in diarrheal diseases.   As a diet before certain medical tests are performed.  ADEQUACY The clear liquid diet is adequate only in ascorbic acid, according to the Recommended Dietary Allowances of the [employer].  CHOOSING FOODS Breads and Starches  Allowed: None are allowed.   Avoid: All are to be avoided.  Vegetables  Allowed: Strained vegetable juices.   Avoid: Any others.  Fruit  Allowed: Strained fruit juices and fruit drinks. Include 1 serving of citrus or vitamin C-enriched fruit juice daily.   Avoid: Any others.  Meat and Meat Substitutes  Allowed: None are allowed.   Avoid: All are to be avoided.  Milk Products  Allowed: None are allowed.   Avoid: All are to be avoided.  Soups and Combination Foods  Allowed: Clear bouillon, broth, or strained broth-based soups.   Avoid: Any others.  Desserts and Sweets  Allowed: Sugar, honey. High-protein gelatin. Flavored gelatin, ices, or frozen ice pops that do not contain milk.   Avoid: Any others.  Fats and Oils  Allowed: None are allowed.   Avoid: All are to be avoided.  Beverages  Allowed: Cereal beverages, coffee (regular or decaffeinated), tea, or soda at the discretion of your health care provider.   Avoid: Any others.  Condiments  Allowed: Salt.   Avoid: Any others, including pepper.  Supplements  Allowed: Liquid nutrition beverages that you can see through.   Avoid: Any others that contain lactose or fiber. SAMPLE MEAL  PLAN Breakfast  4 oz (120 mL) strained orange juice.   to 1 cup (120 to 240 mL) gelatin (plain or fortified).  1 cup (240 mL) beverage (coffee or tea).  Sugar, if desired. Midmorning Snack   cup (120 mL) gelatin (plain or fortified). Lunch  1 cup (240 mL) broth or consomm.  4 oz (120 mL) strained grapefruit juice.   cup (120 mL) gelatin (plain or fortified).  1 cup (240 mL) beverage (coffee or tea).  Sugar, if desired. Midafternoon Snack   cup (120 mL) fruit ice.   cup (120 mL) strained fruit juice. Dinner  1 cup (240 mL) broth or consomm.   cup (120 mL) cranberry juice.   cup (120 mL)  flavored gelatin (plain or fortified).  1 cup (240 mL) beverage (coffee or tea).  Sugar, if desired. Evening Snack  4 oz (120 mL) strained apple juice (vitamin C-fortified).   cup (120 mL) flavored gelatin (plain or fortified). MAKE SURE YOU:  Understand these instructions.  Will watch your child's condition.  Will get help right away if your child is not doing well or gets worse. Document Released: 07/24/2005 Document Revised: 03/26/2013 Document Reviewed: 12/24/2012 [facility] Patient Information 2014 [place], [place].   Emergency Department Resource Guide 1) Find a Doctor and Pay Out of Pocket Although you won't have to find out who is covered by your insurance plan, it is a good idea to ask around and get recommendations. You will then need to call the office and see if the doctor you have chosen will accept you as a new patient and what types of options they offer for patients who are self-pay. Some doctors offer discounts or will set up payment plans for their patients who do not have insurance, but you will need to ask so you aren't surprised when you get to your appointment.  2) Contact Your Local Health Department Not all health departments have doctors that can see patients for sick visits, but many do, so it is worth a call to see if yours does. If you don't know where your local health department is, you can check in your phone book. The CDC also has a tool to help you locate your state's health department, and many state websites also have listings of all of their local health departments.  3) Find a Walk-in Clinic If your illness is not likely to be very severe or complicated, you may want to try a walk in clinic. These are popping up all over the country in pharmacies, drugstores, and shopping centers. They're usually staffed by nurse practitioners or physician assistants that have been trained to treat common illnesses and complaints. They're usually fairly quick and  inexpensive. However, if you have serious medical issues or chronic medical problems, these are probably not your best option.  No Primary Care Doctor: - Call Health Connect at  [phone] - they can help you locate a primary care doctor that  accepts your insurance, provides certain services, etc. - Physician Referral Service- [phone]  Chronic Pain Problems: Organization         Address  Phone   Notes  [facility]  [phone] Patients need to be referred by their primary care doctor.   Medication Assistance: Organization         Address  Phone   Notes  [facility] Medication [facility] [address] [place] [place], [place] [address] [phone] --Must be a resident of [facility] --  Must have NO insurance coverage whatsoever (no Medicaid/ Medicare, etc.) -- The pt. MUST have a primary care doctor that directs their care regularly and follows them in the community   [facility]ist  [phone]   [employer]  [phone]    Agencies that provide inexpensive medical care: Organization         Address  Phone   Notes  [facility]  [phone]   [facility]    [phone]   [facility] [address] [place], [place] [address] [phone]   [facility] [address] [place] [place] [phone]   Planned Parenthood    [phone]   [facility]    [phone]   [facility]  [address], [place] Phone:  [phone], Fax:  [phone] Hours of Operation:  9 am - 6 pm, M-F.  Also accepts Medicaid/Medicare and self-pay.  [facility]  [address], [place] Phone: [phone], Fax: [phone]. Hours of Operation:  8:30 am - 5:30 pm, M-F.  Also accepts Medicaid and self-pay.  [facility] [place] [address] [place] Phone: [phone]   [facility] [address] Natasha Bence [place], [place] [phone], Ext. [phone] Mondays & Thursdays: 7-9 AM.  First 15 patients are seen on a first come, first serve basis.    Medicaid-accepting [facility] Providers:  Organization         Address  Phone   Notes  [facility] [address],  [phone] Also accepts self-pay patients.  [facility] [address] Laurell Josephs [place], [place]  [phone]   [facility] [address], [place] [phone]   [facility] [address], [place] [phone]   Renaye Rakers [address], [place]   [phone] Only accepts [place] Access [place] patients after they have their name applied to their card.   Self-Pay (no insurance) in [facility]:  Organization         Address  Phone   Notes  Sickle Cell Patients, [facility] [address] [place], [place] [phone]   [facility] [address] [place], [place] [phone]   [facility]  [address], [place] [phone]   [facility]/Dr. Osei-Bonsu  [address] [place] or [address], [place] [phone] Phone number for both [place] and [employer] locations is the same.  Urgent Medical and [facility] [address], [place] [phone]   [facility] [address], [place] or [address] [phone]   [facility] [address] [place] [phone], phone; [phone], fax Sees patients 1st and 3rd Saturday of every month.  Must not qualify for public or private insurance (i.e. Medicaid, Medicare, [place] Health Choice, Veterans' Benefits)  Household income should be no more than 200% of the poverty level The clinic cannot treat you if you are pregnant or  think you are pregnant  Sexually transmitted diseases are not treated at the clinic.    Dental Care: Organization         Address  Phone  Notes  [facility] [address], [place] [phone] Accepts children up to age 58 who are enrolled in [place] or [place] Health Choice; pregnant women with a Medicaid card; and children who have applied for Medicaid or [place] Health Choice, but were declined, whose parents can pay a reduced fee at time of service.  [facility] [place]  [address], [place] [phone] Accepts children up to age 58 who are enrolled in [place] or [place] Health Choice; pregnant women with a Medicaid card; and children who have applied for Medicaid or [place] Health Choice, but were declined, whose parents can pay a reduced fee at time of service.  [facility]  [address], [place] [phone] Patients are seen by appointment only. Walk-ins are not accepted. [facility] will see patients 58 years of age and older. Monday - Tuesday (8am-5pm) Most Wednesdays (8:30-5pm) $30 per visit, cash only  [facility]  [address], [facility] [phone] Patients are seen by appointment only. Walk-ins are not accepted. [facility] will see patients 58 years of age and older. One Wednesday Evening (Monthly: Volunteer Based).  $30 per visit, cash only  [employer] [facility] [facility]  [phone]) [phone] for adults; Children under age 574, call Graduate Pediatric Dentistry at [phone]. Children aged 484-14, please call [phone]) [phone] to request a pediatric application.  Dental services are provided in all areas of dental care including fillings, crowns and bridges, complete and partial dentures, implants, gum treatment, root canals, and extractions. Preventive care is also provided. Treatment is provided to both adults  and children. Patients are selected via a lottery and there is often a waiting list.   [facility] [address], [place]  [phone] [email or web address]   [facility] [address], [place], [place] [phone], Ext. [phone] Second and Fourth Thursday of each month, opens at 6:30 AM; Clinic ends at 9 AM.  Patients are seen on a first-come first-served basis, and a limited number are seen during each clinic.   [facility]  [address], [place], [place] [phone]   Eligibility Requirements You must have lived in [place], [place], or [place] for at least the last three months.   You cannot be eligible for state or federal sponsored [place]healthcare insurance, including CIG[employer]eterans Administration, [place]icaid, or [employer]icare.   You generally cannot be eligible for healthcare insurance through your employer.    How to apply: Eligibility screenings are held every Tuesday and Wednesday afternoon from 1:00 pm until 4:00 pm. You do not need an appointment for the interview!  [facility] [address], [place], [place] [phone]   [facility]  [phone]   [facility]  [phone]   [facility]  [phone]    Behavioral Health Resources in the Community: Intensive Outpatient Programs Organization         Address  Phone  Notes  [facility] [address], [place], [place] [phone]   [facility] [address], [place], [place] [phone]   [facility] [address], [place], [place]  [phone]   [facility] [address],  [place], [place] [phone] or [phone]   Substance Abuse Resources Organization  Address  Phone  Notes  [facility] Drug Services  [phone]   [facility]  [phone]   [facility] [place] [facility]  [phone]     Floydene Flock  [phone]   Residential & Outpatient Substance Abuse Program  [phone]   Psychological Services Organization         Address  Phone  Notes  [facility]  [phone]   [facility]  [phone]   [facility] [address], [place] [phone] or [phone]    Mobile Crisis Teams Organization         Address  Phone  Notes  [facility], Mobile Crisis Care Unit  [phone]   [facility]  [address] [place], [place] [phone]   Doristine Locks [address] [place] [phone]    Self-Help/Support Groups Organization         Address  Phone             Notes  [facility] [place] - variety of support groups  [phone]- I7437963 Call for more information  Narcotics Anonymous (NA), [facility] [address], [employer] [place]  2 meetings at this location   Statistician         Address  Phone  Notes  [facility] [address],    [place]  [phone]   [facility]  [address], [place] [address], [place], [place] [phone]   [facility] [address] [place], [place] [phone] Admissions: 8am-3pm M-F  [facility] [address],    [place], [place] [phone]   [facility] [address] [place], [place], [place] [phone]   [facility] [address],  [place], [place] [phone]   [facility] - Intensive Outpatient [address]ure[address], [place], [place] [phone]   [facility] ([facility] [address] [place]  [place], [place] [phone] or [phone]   [facility] ([facility]) [address], [place], [place] [phone] Accepts Medicaid  [facility] [address],  [place] [phone] Substance Abuse/Addiction Treatment   [facility] Organization         Address  Phone  Notes  [facility]  [phone]   Angie Fava, PhD [address] Ervin Knack [place], [place]   [phone] or [phone]   [facility]   [address] [place], [place] [phone]   [facility] [address] [place], [place] [phone] Insurance/Medicaid/sponsorship through [facility] and Families [address]                                    [place], [place] [phone] Therapy/tele-psych/case  [facility] [address], [place] [phone]    Dr. Lolly Mustache  [phone]   [facility] [place]  [employer] [facility] Dept. 1) [address] [place] 2) [address] [place] 3)  [address], [place] [phone]  [phone]   [facility] Child Abuse Hotline 7250598060 or [phone] (After Hours)

## 2013-09-04 NOTE — ED Provider Notes (Signed)
CSN: [id number]     Arrival date & time 09/04/13  0759 History   First MD Initiated Contact with Patient 09/04/13 [phone]     Chief Complaint  Patient presents with  . Rectal Bleeding  . Abdominal Pain   (Consider location/radiation/quality/duration/timing/severity/associated sxs/prior Treatment) The history is provided by the patient. No language interpreter was used.  Armani Jennette Kettle is a 58 year old female with past medical history of diabetes, hypertension, hypokalemia, hypercholesterolemia, diverticulitis presenting to the emergency department with abdominal pain and bright red blood in stools that started yesterday evening. Patient reported the abdominal pain is generalized described as a muscle tightness with discomfort localized to the left lower quadrant described as "just a pain." Patient reported that just prior to defecation she gets pain localized in the lower abdomen and left lower quadrant. Stated that the pain radiates toward her back and rectum. Patient reported that she is taking nothing for the pain. Stated she takes Aleve daily and started taking a diet pill called Garcinia Cambogia on Monday and has been taking the medication daily. Reported that she hasn't taken any medication this morning. Reported that starting last night she's had at least 5 bowel movements with each consisting of blood in the stools, denied black tarry stools-reported bright red blood when she wipes. Denied pain at the rectum with defecation or wipening. Stated that she does have history of hemorrhoids. Stated that along with the blood in the stools she has noticed mucus in her stools as well starting yesterday. Reported that she's been having intermittent chest pain localize to the Center for chest - reported that this is normal for her. Stated that her last episode of diverticulitis was approximately 6 months ago. Denied fever, chills, shortness of breath, difficulty breathing, nausea, vomiting, dizziness, blurred  vision, urinary symptoms, dysuria, hematuria, melena. PCP Dr. Julio Sicks GI none   Past Medical History  Diagnosis Date  . Diabetes mellitus   . Hypertension   . Hypokalemia   . Diverticulitis   . Hypercholesteremia    Past Surgical History  Procedure Laterality Date  . Cholecystectomy    . Abdominal hysterectomy    . Appendectomy    . Wisdom tooth extraction     Family History  Problem Relation Age of Onset  . Diabetes Mother   . Gout Mother   . Hypertension Mother   . Arthritis Mother   . Heart disease Father   . Arthritis Sister   . Heart disease Maternal Grandmother   . Diabetes Maternal Grandmother   . Heart disease Paternal Grandmother   . Diabetes Paternal Grandmother    History  Substance Use Topics  . Smoking status: Former Games developer  . Smokeless tobacco: Never Used  . Alcohol Use: Yes     Comment: occasional / social   OB History   Grav Para Term Preterm Abortions TAB SAB Ect Mult Living                 Review of Systems  Constitutional: Negative for fever and chills.  Respiratory: Negative for chest tightness and shortness of breath.   Cardiovascular: Positive for chest pain.  Gastrointestinal: Positive for nausea, abdominal pain and blood in stool. Negative for vomiting, diarrhea and constipation.  Genitourinary: Negative for dysuria, hematuria and decreased urine volume.  Musculoskeletal: Positive for back pain.  Neurological: Positive for dizziness. Negative for weakness and headaches.  All other systems reviewed and are negative.    Allergies  Aspirin and Latex  Home Medications   Current  Outpatient Rx  Name  Route  Sig  Dispense  Refill  . Garcinia Cambogia-Chromium 500-200 MG-MCG TABS   Oral   Take 1 tablet by mouth daily.         Marland Kitchen. glimepiride (AMARYL) 2 MG tablet   Oral   Take 2 mg by mouth daily before breakfast.         . losartan (COZAAR) 50 MG tablet   Oral   Take 50 mg by mouth every morning.         . Multiple  Vitamins-Minerals (HAIR/SKIN/NAILS PO)   Oral   Take 1 tablet by mouth daily.         . naproxen sodium (ALEVE) 220 MG tablet   Oral   Take 440 mg by mouth every 8 (eight) hours as needed (For pain.).         Marland Kitchen. potassium chloride SA (K-DUR,KLOR-CON) 20 MEQ tablet   Oral   Take 20 mEq by mouth every morning.          . ciprofloxacin (CIPRO) 500 MG tablet   Oral   Take 1 tablet (500 mg total) by mouth 2 (two) times daily.   20 tablet   0   . metroNIDAZOLE (FLAGYL) 500 MG tablet   Oral   Take 1 tablet (500 mg total) by mouth 3 (three) times daily.   30 tablet   0   . ondansetron (ZOFRAN) 4 MG tablet   Oral   Take 1 tablet (4 mg total) by mouth every 6 (six) hours.   12 tablet   0   . rosuvastatin (CRESTOR) 10 MG tablet   Oral   Take 10 mg by mouth at bedtime.          BP 149/71  Pulse 76  Temp(Src) 98.3 F (36.8 C) (Oral)  Resp 19  SpO2 100% Physical Exam  Nursing note and vitals reviewed. Constitutional: She is oriented to person, place, and time. She appears well-developed and well-nourished. No distress.  HENT:  Head: Normocephalic and atraumatic.  Mouth/Throat: Oropharynx is clear and moist. No oropharyngeal exudate.  Moist mucous membranes  Eyes: Conjunctivae and EOM are normal. Pupils are equal, round, and reactive to light. Right eye exhibits no discharge. Left eye exhibits no discharge.  Neck: Normal range of motion. Neck supple. No tracheal deviation present.  Cardiovascular: Normal rate, regular rhythm and normal heart sounds.  Exam reveals no friction rub.   No murmur heard. Pulses:      Radial pulses are 2+ on the right side, and 2+ on the left side.  Pulmonary/Chest: Effort normal and breath sounds normal. No respiratory distress. She has no wheezes. She has no rales.  Abdominal: Soft. Normal appearance and bowel sounds are normal. There is tenderness in the suprapubic area and left lower quadrant. There is no guarding.    Discomfort upon  palpation to left lower quadrant  Genitourinary:  Rectal exam: Small hemorrhoid localized at 3 o'clock - negative thrombosis or active bleeding noted. Negative bleeding noted to the anus. Negative swelling, erythema, inflammation, sores noted to the anus. Negative pain upon palpation to the anus. Negative masses, lesions, sores palpated to the rectum. Negative blood on glove. Brown stools noted on glove, stool soft. Strong sphincter tone. Exam chaperoned with the nurse  Musculoskeletal: Normal range of motion.  Full ROM to upper and lower extremities without difficulty noted, negative ataxia noted.  Lymphadenopathy:    She has no cervical adenopathy.  Neurological: She is alert and  oriented to person, place, and time. She exhibits normal muscle tone. Coordination normal.  Skin: Skin is warm and dry. No rash noted. She is not diaphoretic. No erythema.  Psychiatric: She has a normal mood and affect. Her behavior is normal. Thought content normal.    ED Course  Procedures (including critical care time)  9:47 AM Plan - repeat CBC and troponin within 3 hours-check hemoglobin and hematocrit to see if any change. Patient to be started on by mouth antibiotics, will treat for diverticulitis.  12:06 PM Second CBC resulted with mild drop in hemoglobin and hematocrit. Originally when patient arrived in ED hemoglobin was 14.1 and hematocrit was 41.9. Second CBC noted hemoglobin to be 13.2 and hematocrit to be 39.1. Second troponin negative elevation.  2:13 PM This provider went to re-assess the patient to see how she is doing. Patient reported that she is experiencing mild chest discomfort - reported that the chest discomfort is described as a indigestion sensation.   3:41 PM This provider re-assessed the patient. Patient denied having chest pain. Patient reported that she is feeling better. Stated that she is having mild abdominal pain to the left lower quadrant described as a needle sensation. Patient  tolerated PO fluids and food without difficulty.   Results for orders placed during the hospital encounter of 09/04/13  CBC WITH DIFFERENTIAL      Result Value Range   WBC 5.5  4.0 - 10.5 K/uL   RBC 4.85  3.87 - 5.11 MIL/uL   Hemoglobin 14.1  12.0 - 15.0 g/dL   HCT 16.1  09.6 - 04.5 %   MCV 86.4  78.0 - 100.0 fL   MCH 29.1  26.0 - 34.0 pg   MCHC 33.7  30.0 - 36.0 g/dL   RDW 40.9  81.1 - 91.4 %   Platelets    150 - 400 K/uL   Value: PLATELET CLUMPS NOTED ON SMEAR, COUNT APPEARS ADEQUATE   Neutrophils Relative % 42 (*) 43 - 77 %   Lymphocytes Relative 42  12 - 46 %   Monocytes Relative 6  3 - 12 %   Eosinophils Relative 9 (*) 0 - 5 %   Basophils Relative 1  0 - 1 %   Neutro Abs 2.3  1.7 - 7.7 K/uL   Lymphs Abs 2.3  0.7 - 4.0 K/uL   Monocytes Absolute 0.3  0.1 - 1.0 K/uL   Eosinophils Absolute 0.5  0.0 - 0.7 K/uL   Basophils Absolute 0.1  0.0 - 0.1 K/uL   WBC Morphology WHITE COUNT CONFIRMED ON SMEAR     Smear Review       Value: PLATELET CLUMPS NOTED ON SMEAR, COUNT APPEARS ADEQUATE  COMPREHENSIVE METABOLIC PANEL      Result Value Range   Sodium 140  137 - 147 mEq/L   Potassium 4.3  3.7 - 5.3 mEq/L   Chloride 100  96 - 112 mEq/L   CO2 29  19 - 32 mEq/L   Glucose, Bld 109 (*) 70 - 99 mg/dL   BUN 10  6 - 23 mg/dL   Creatinine, Ser 7.82  0.50 - 1.10 mg/dL   Calcium 9.1  8.4 - 95.6 mg/dL   Total Protein 8.0  6.0 - 8.3 g/dL   Albumin 3.6  3.5 - 5.2 g/dL   AST 21  0 - 37 U/L   ALT 23  0 - 35 U/L   Alkaline Phosphatase 104  39 - 117 U/L   Total Bilirubin <  0.2 (*) 0.3 - 1.2 mg/dL   GFR calc non Af Amer >90  >90 mL/min   GFR calc Af Amer >90  >90 mL/min  URINALYSIS, ROUTINE W REFLEX MICROSCOPIC      Result Value Range   Color, Urine YELLOW  YELLOW   APPearance CLEAR  CLEAR   Specific Gravity, Urine 1.014  1.005 - 1.030   pH 7.0  5.0 - 8.0   Glucose, UA NEGATIVE  NEGATIVE mg/dL   Hgb urine dipstick NEGATIVE  NEGATIVE   Bilirubin Urine NEGATIVE  NEGATIVE   Ketones, ur  NEGATIVE  NEGATIVE mg/dL   Protein, ur NEGATIVE  NEGATIVE mg/dL   Urobilinogen, UA 0.2  0.0 - 1.0 mg/dL   Nitrite NEGATIVE  NEGATIVE   Leukocytes, UA NEGATIVE  NEGATIVE  LIPASE, BLOOD      Result Value Range   Lipase 21  11 - 59 U/L  TROPONIN I      Result Value Range   Troponin I <0.30  <0.30 ng/mL  CBC WITH DIFFERENTIAL      Result Value Range   WBC 5.0  4.0 - 10.5 K/uL   RBC 4.55  3.87 - 5.11 MIL/uL   Hemoglobin 13.2  12.0 - 15.0 g/dL   HCT 60.4  54.0 - 98.1 %   MCV 85.9  78.0 - 100.0 fL   MCH 29.0  26.0 - 34.0 pg   MCHC 33.8  30.0 - 36.0 g/dL   RDW 19.1  47.8 - 29.5 %   Platelets 189  150 - 400 K/uL   Neutrophils Relative % 39 (*) 43 - 77 %   Neutro Abs 2.0  1.7 - 7.7 K/uL   Lymphocytes Relative 48 (*) 12 - 46 %   Lymphs Abs 2.4  0.7 - 4.0 K/uL   Monocytes Relative 8  3 - 12 %   Monocytes Absolute 0.4  0.1 - 1.0 K/uL   Eosinophils Relative 4  0 - 5 %   Eosinophils Absolute 0.2  0.0 - 0.7 K/uL   Basophils Relative 1  0 - 1 %   Basophils Absolute 0.0  0.0 - 0.1 K/uL  TROPONIN I      Result Value Range   Troponin I <0.30  <0.30 ng/mL  GLUCOSE, CAPILLARY      Result Value Range   Glucose-Capillary 50 (*) 70 - 99 mg/dL  GLUCOSE, CAPILLARY      Result Value Range   Glucose-Capillary 142 (*) 70 - 99 mg/dL  OCCULT BLOOD, POC DEVICE      Result Value Range   Fecal Occult Bld POSITIVE (*) NEGATIVE    Labs Review Labs Reviewed  CBC WITH DIFFERENTIAL - Abnormal; Notable for the following:    Neutrophils Relative % 42 (*)    Eosinophils Relative 9 (*)    All other components within normal limits  COMPREHENSIVE METABOLIC PANEL - Abnormal; Notable for the following:    Glucose, Bld 109 (*)    Total Bilirubin <0.2 (*)    All other components within normal limits  CBC WITH DIFFERENTIAL - Abnormal; Notable for the following:    Neutrophils Relative % 39 (*)    Lymphocytes Relative 48 (*)    All other components within normal limits  GLUCOSE, CAPILLARY - Abnormal; Notable  for the following:    Glucose-Capillary 50 (*)    All other components within normal limits  GLUCOSE, CAPILLARY - Abnormal; Notable for the following:    Glucose-Capillary 142 (*)    All  other components within normal limits  OCCULT BLOOD, POC DEVICE - Abnormal; Notable for the following:    Fecal Occult Bld POSITIVE (*)    All other components within normal limits  URINALYSIS, ROUTINE W REFLEX MICROSCOPIC  LIPASE, BLOOD  TROPONIN I  TROPONIN I   Imaging Review No results found.  EKG Interpretation   None      Date: 09/04/2013  Rate: 78  Rhythm: normal sinus rhythm  QRS Axis: normal  Intervals: normal  ST/T Wave abnormalities: normal  Conduction Disutrbances:none  Narrative Interpretation:   Old EKG Reviewed: unchanged last EKG performed on 03/2013 EKG analyzed and reviewed by this provider and attending physician.    MDM   1. Diverticulitis     Medications  sodium chloride 0.9 % bolus 1,000 mL (0 mLs Intravenous Stopped 09/04/13 1006)  ciprofloxacin (CIPRO) tablet 500 mg (500 mg Oral Given 09/04/13 1006)  metroNIDAZOLE (FLAGYL) tablet 500 mg (500 mg Oral Given 09/04/13 1006)  gi cocktail (Maalox,Lidocaine,Donnatal) (30 mLs Oral Given 09/04/13 1436)   Filed Vitals:   09/04/13 1128 09/04/13 1237 09/04/13 1250 09/04/13 1441  BP: 149/71     Pulse: 70   76  Temp: 98.3 F (36.8 C)     TempSrc:      Resp: 20 15 14 19   SpO2: 99%  100%     Patient presenting to emergency department with abdominal pain and bright red bloody stools that started yesterday. Patient reported that she's had at least 5 bowel movements since yesterday consisting of bright red blood stools and blood when she wipes-denied melena. Reported abdominal pain is generalized described as a muscle tightness with discomfort localized to the left lower quadrant prior to and during defecation. Patient described the discomfort as "just a pain." Patient does have history of diverticulitis, reported her last  episode was approximately 6 months ago. Stated that she's noticed some mucus in her stools as well. Alert and oriented. GCS 15. Heart rate and rhythm normal. Lungs clear to auscultation to upper and lower lobes. Radial pulses 2+ bilaterally. Abdomen appears normal. Bowel sounds normoactive in all 4 quadrants. Discomfort upon palpation to the left lower quadrant. Rectal exam performed with hemorrhoid localized at 3 o'clock - negative thrombosis noted, negative pain upon palpation. Negative abnormalities, active bleeding noted to the anus. Negative masses palpated upon rectum. Stool soft, brown stool on glove. Negative blood on glove. Negative signs of fissures noted. EKG noted normal sinus rhythm with a heart rate of 78 beats per minute-negative ischemic findings noted, negative new findings. First troponin negative elevation. CBC negative findings-hemoglobin 14.1 and hematocrit 41.9. CMP negative findings. Lipase negative elevation. Fecal occult positive. Urinalysis negative for infection-negative signs of nitrites or leukocytosis. Second CBC resulted with mild drop in hemoglobin and hematocrit. Originally when patient arrived in ED hemoglobin was 14.1 and hematocrit was 41.9. Second CBC noted hemoglobin to be 13.2 and hematocrit to be 39.1. Second troponin negative elevation. Blood glucose levels improved after patient eating, went from 50 to 142. Doubt obstruction. Doubt abscess or perforation. Doubt cardiac issue-suspicion of chest discomfort to be GERD related. Suspicion to be diverticulitis based on presentation and patient having history of diverticulitis. Suspicion of positive fecal occult secondary to diverticulitis flareup. Patient does not meet sepsis criteria. Patient able to tolerate food and fluids by mouth without episodes of emesis. Negative episodes of emesis while in ED setting. Patient hemodynamically stable. Discussed case with attending physician who saw and assessed patient - cleared patient  for discharge.  Discharged patient with antibiotics. Referred patient to primary care provider and gastroenterologist. Recommended patient to be reassessed within 24-48 hours by primary care provider. Discussed with patient diet - clear liquid diet. Discussed with patient to rest and stay hydrated. Discussed with patient to closely monitor symptoms and if symptoms are to worsen or change to report back to the ED - strict return instructions given.  Patient agreed to plan of care, understood, all questions answered.   Raymon Mutton, PA-C 09/04/13 1822

## 2013-09-04 NOTE — ED Notes (Signed)
Pt attempted to give urine specimen was unable to go. Has been given water, will try to collect again later

## 2013-09-04 NOTE — ED Notes (Signed)
Pt given ginger ale, nurse aware of CBG

## 2013-09-04 NOTE — ED Notes (Addendum)
Pt reports no change in medicines, except she started taking a diet pill 1 week ago. Hx of diverticulitis (no issues with in past 6 months) and hemmroids, but denies itching. Reports 2 days ago abdominal pain and right flank pain 7/10, and noticing blood on tissue when after having bowl movement. Yesterday pt started having blood with stool in toilet after bowel movement. 5 bowel movements since last night, bowel movement/ abdominal pain wakes pt up out of sleep. Reports cannot take aspirin because she bruises very easily. No hx of rectal bleeding.  Denies n/v/d.

## 2013-09-05 ENCOUNTER — Other Ambulatory Visit (HOSPITAL_BASED_OUTPATIENT_CLINIC_OR_DEPARTMENT_OTHER): Payer: Self-pay | Admitting: Internal Medicine

## 2013-09-05 ENCOUNTER — Telehealth (HOSPITAL_COMMUNITY): Payer: Self-pay

## 2013-09-05 DIAGNOSIS — Z139 Encounter for screening, unspecified: Secondary | ICD-10-CM

## 2013-09-05 NOTE — ED Provider Notes (Signed)
Medical screening examination/treatment/procedure(s) were conducted as a shared visit with non-physician practitioner(s) and myself.  I personally evaluated the patient during the encounter.  EKG Interpretation   None      Pt comes in with cc of abd pain and brbpr. Hx of diverticulitis. Hb is >14. Pt has LLQ tenderness on exam. No SIRs criteria. Will tx as diverticulitis - based on exam, there is no peritoneal signs, and i doubt there is any severe complication of diverticulitis at this time - such as perf or abscess.  Will d/c with meds, return precautions have been discussed.  Derwood KaplanAnkit Shital Crayton, MD 09/05/13 [phone]

## 2013-09-05 NOTE — ED Notes (Signed)
Zofran requires prior auth. Spoke with Dr Linwood DibblesJon Knapp he changed order to Phenergan 25mg - take one every 6 hours as needed for nausea. Disp 10 with no refills.

## 2013-09-08 DIAGNOSIS — F329 Major depressive disorder, single episode, unspecified: Secondary | ICD-10-CM | POA: Diagnosis not present

## 2013-09-08 DIAGNOSIS — E1142 Type 2 diabetes mellitus with diabetic polyneuropathy: Secondary | ICD-10-CM | POA: Diagnosis not present

## 2013-09-08 DIAGNOSIS — E785 Hyperlipidemia, unspecified: Secondary | ICD-10-CM | POA: Diagnosis not present

## 2013-09-08 DIAGNOSIS — M25519 Pain in unspecified shoulder: Secondary | ICD-10-CM | POA: Diagnosis not present

## 2013-09-08 DIAGNOSIS — I10 Essential (primary) hypertension: Secondary | ICD-10-CM | POA: Diagnosis not present

## 2013-09-08 DIAGNOSIS — E119 Type 2 diabetes mellitus without complications: Secondary | ICD-10-CM | POA: Diagnosis not present

## 2013-09-08 DIAGNOSIS — K219 Gastro-esophageal reflux disease without esophagitis: Secondary | ICD-10-CM | POA: Diagnosis not present

## 2013-09-08 DIAGNOSIS — F3289 Other specified depressive episodes: Secondary | ICD-10-CM | POA: Diagnosis not present

## 2013-09-08 DIAGNOSIS — R609 Edema, unspecified: Secondary | ICD-10-CM | POA: Diagnosis not present

## 2013-09-10 ENCOUNTER — Other Ambulatory Visit: Payer: Self-pay | Admitting: Gastroenterology

## 2013-09-10 DIAGNOSIS — R1032 Left lower quadrant pain: Secondary | ICD-10-CM | POA: Diagnosis not present

## 2013-09-10 DIAGNOSIS — K5732 Diverticulitis of large intestine without perforation or abscess without bleeding: Secondary | ICD-10-CM | POA: Diagnosis not present

## 2013-09-10 DIAGNOSIS — R1012 Left upper quadrant pain: Secondary | ICD-10-CM | POA: Diagnosis not present

## 2013-09-12 ENCOUNTER — Ambulatory Visit
Admission: RE | Admit: 2013-09-12 | Discharge: 2013-09-12 | Disposition: A | Discharge status: Home / Self Care | Payer: Medicare Other | Source: Ambulatory Visit | Attending: Gastroenterology | Admitting: Gastroenterology

## 2013-09-12 DIAGNOSIS — R1032 Left lower quadrant pain: Secondary | ICD-10-CM

## 2013-09-12 DIAGNOSIS — K429 Umbilical hernia without obstruction or gangrene: Secondary | ICD-10-CM | POA: Diagnosis not present

## 2013-09-12 DIAGNOSIS — K5730 Diverticulosis of large intestine without perforation or abscess without bleeding: Secondary | ICD-10-CM | POA: Diagnosis not present

## 2013-09-12 MED ORDER — IOHEXOL 300 MG/ML  SOLN
100.0000 mL | Freq: Once | INTRAMUSCULAR | Status: AC | PRN
  Administered 2013-09-12: 100 mL via INTRAVENOUS

## 2013-09-17 DIAGNOSIS — E669 Obesity, unspecified: Secondary | ICD-10-CM | POA: Diagnosis not present

## 2013-09-17 DIAGNOSIS — G4733 Obstructive sleep apnea (adult) (pediatric): Secondary | ICD-10-CM | POA: Diagnosis not present

## 2013-09-17 DIAGNOSIS — G4710 Hypersomnia, unspecified: Secondary | ICD-10-CM | POA: Diagnosis not present

## 2013-09-18 DIAGNOSIS — G4710 Hypersomnia, unspecified: Secondary | ICD-10-CM | POA: Diagnosis not present

## 2013-09-18 DIAGNOSIS — G4733 Obstructive sleep apnea (adult) (pediatric): Secondary | ICD-10-CM | POA: Diagnosis not present

## 2013-09-18 DIAGNOSIS — E669 Obesity, unspecified: Secondary | ICD-10-CM | POA: Diagnosis not present

## 2013-10-01 ENCOUNTER — Inpatient Hospital Stay (HOSPITAL_BASED_OUTPATIENT_CLINIC_OR_DEPARTMENT_OTHER): Admission: RE | Admit: 2013-10-01 | Payer: Medicare Other | Source: Ambulatory Visit

## 2013-10-01 ENCOUNTER — Ambulatory Visit (HOSPITAL_BASED_OUTPATIENT_CLINIC_OR_DEPARTMENT_OTHER)
Admission: RE | Admit: 2013-10-01 | Discharge: 2013-10-01 | Disposition: A | Discharge status: Home / Self Care | Payer: Medicare HMO | Source: Ambulatory Visit | Attending: Internal Medicine | Admitting: Internal Medicine

## 2013-10-01 ENCOUNTER — Other Ambulatory Visit (HOSPITAL_BASED_OUTPATIENT_CLINIC_OR_DEPARTMENT_OTHER): Payer: Self-pay | Admitting: Internal Medicine

## 2013-10-01 DIAGNOSIS — Z1231 Encounter for screening mammogram for malignant neoplasm of breast: Secondary | ICD-10-CM | POA: Diagnosis not present

## 2013-10-01 DIAGNOSIS — Z139 Encounter for screening, unspecified: Secondary | ICD-10-CM

## 2013-10-10 DIAGNOSIS — E119 Type 2 diabetes mellitus without complications: Secondary | ICD-10-CM | POA: Diagnosis not present

## 2013-10-10 DIAGNOSIS — R609 Edema, unspecified: Secondary | ICD-10-CM | POA: Diagnosis not present

## 2013-10-10 DIAGNOSIS — E559 Vitamin D deficiency, unspecified: Secondary | ICD-10-CM | POA: Diagnosis not present

## 2013-10-10 DIAGNOSIS — Z0110 Encounter for examination of ears and hearing without abnormal findings: Secondary | ICD-10-CM | POA: Diagnosis not present

## 2013-10-10 DIAGNOSIS — K219 Gastro-esophageal reflux disease without esophagitis: Secondary | ICD-10-CM | POA: Diagnosis not present

## 2013-10-10 DIAGNOSIS — I10 Essential (primary) hypertension: Secondary | ICD-10-CM | POA: Diagnosis not present

## 2013-10-10 DIAGNOSIS — Z0000 Encounter for general adult medical examination without abnormal findings: Secondary | ICD-10-CM | POA: Diagnosis not present

## 2013-10-10 DIAGNOSIS — M25519 Pain in unspecified shoulder: Secondary | ICD-10-CM | POA: Diagnosis not present

## 2013-10-10 DIAGNOSIS — E785 Hyperlipidemia, unspecified: Secondary | ICD-10-CM | POA: Diagnosis not present

## 2013-10-10 DIAGNOSIS — R21 Rash and other nonspecific skin eruption: Secondary | ICD-10-CM | POA: Diagnosis not present

## 2013-10-11 ENCOUNTER — Emergency Department (HOSPITAL_BASED_OUTPATIENT_CLINIC_OR_DEPARTMENT_OTHER)
Admission: EM | Admit: 2013-10-11 | Discharge: 2013-10-11 | Disposition: A | Discharge status: Home / Self Care | Payer: Medicare Other | Attending: Emergency Medicine | Admitting: Emergency Medicine

## 2013-10-11 ENCOUNTER — Encounter (HOSPITAL_BASED_OUTPATIENT_CLINIC_OR_DEPARTMENT_OTHER): Payer: Self-pay | Admitting: Emergency Medicine

## 2013-10-11 DIAGNOSIS — E119 Type 2 diabetes mellitus without complications: Secondary | ICD-10-CM | POA: Diagnosis not present

## 2013-10-11 DIAGNOSIS — Z87891 Personal history of nicotine dependence: Secondary | ICD-10-CM | POA: Diagnosis not present

## 2013-10-11 DIAGNOSIS — L509 Urticaria, unspecified: Secondary | ICD-10-CM | POA: Diagnosis not present

## 2013-10-11 DIAGNOSIS — I10 Essential (primary) hypertension: Secondary | ICD-10-CM | POA: Diagnosis not present

## 2013-10-11 DIAGNOSIS — Z79899 Other long term (current) drug therapy: Secondary | ICD-10-CM | POA: Diagnosis not present

## 2013-10-11 DIAGNOSIS — E876 Hypokalemia: Secondary | ICD-10-CM | POA: Diagnosis not present

## 2013-10-11 DIAGNOSIS — E7800 Pure hypercholesterolemia, unspecified: Secondary | ICD-10-CM | POA: Diagnosis not present

## 2013-10-11 DIAGNOSIS — Z8719 Personal history of other diseases of the digestive system: Secondary | ICD-10-CM | POA: Insufficient documentation

## 2013-10-11 DIAGNOSIS — Z91040 Latex allergy status: Secondary | ICD-10-CM | POA: Diagnosis not present

## 2013-10-11 DIAGNOSIS — Z791 Long term (current) use of non-steroidal anti-inflammatories (NSAID): Secondary | ICD-10-CM | POA: Diagnosis not present

## 2013-10-11 MED ORDER — CETIRIZINE HCL 10 MG PO TABS: 10.0000 mg | ORAL_TABLET | Freq: Every day | ORAL | Status: DC

## 2013-10-11 MED ORDER — FAMOTIDINE 20 MG PO TABS: 20.0000 mg | ORAL_TABLET | Freq: Two times a day (BID) | ORAL | Status: DC

## 2013-10-11 MED ORDER — PREDNISONE 10 MG PO TABS: ORAL_TABLET | ORAL | Status: DC

## 2013-10-11 MED ORDER — DEXAMETHASONE SODIUM PHOSPHATE 10 MG/ML IJ SOLN
INTRAMUSCULAR | Status: AC
  Filled 2013-10-11: qty 1

## 2013-10-11 MED ORDER — DEXAMETHASONE SODIUM PHOSPHATE 4 MG/ML IJ SOLN
8.0000 mg | Freq: Once | INTRAMUSCULAR | Status: AC
  Administered 2013-10-11: 8 mg via INTRAMUSCULAR

## 2013-10-11 MED ORDER — LORATADINE 10 MG PO TABS
10.0000 mg | ORAL_TABLET | Freq: Once | ORAL | Status: AC
  Administered 2013-10-11: 10 mg via ORAL
  Filled 2013-10-11: qty 1

## 2013-10-11 MED ORDER — BETAMETHASONE SOD PHOS & ACET 6 (3-3) MG/ML IJ SUSP
6.0000 mg | Freq: Once | INTRAMUSCULAR | Status: DC
  Filled 2013-10-11: qty 1

## 2013-10-11 NOTE — Discharge Instructions (Signed)
Stop the new medication. Take the medications for allergic reaction. Return for worsening symptoms.

## 2013-10-11 NOTE — ED Provider Notes (Signed)
Medical screening examination/treatment/procedure(s) were performed by non-physician practitioner and as supervising physician I was immediately available for consultation/collaboration.  Zaray Gatchel E Marinna Blane, MD 10/11/13 2201 

## 2013-10-11 NOTE — ED Notes (Addendum)
C/o generalized body rash, onset on Thursday night.  Seen PMD yesterday, given Clobetasol cream.  States started Hyoscyamine on Wed, is allergic to aspirin but takes a BASA anyway.  Bought a new brand of ASA Thursday.Here d/t continued itching.

## 2013-10-11 NOTE — ED Provider Notes (Signed)
CSN: [id number]     Arrival date & time 10/11/13  1252 History   First MD Initiated Contact with Patient 10/11/13 1317     Chief Complaint  Patient presents with  . Rash     (Consider location/radiation/quality/duration/timing/severity/associated sxs/prior Treatment) Patient is a 58 y.o. female presenting with rash. The history is provided by the patient.  Rash Location:  Full body Quality: itchiness and redness   Severity:  Moderate Onset quality:  Gradual Duration:  3 days Timing:  Constant Progression:  Worsening Chronicity:  New Context: medications   Relieved by:  Nothing Worsened by:  Nothing tried Ineffective treatments:  Topical steroids Associated symptoms: no fever, no headaches, no nausea, no shortness of breath, no sore throat, not vomiting and not wheezing    Bonnie Gordon is a 58 y.o. female who presents to the ED with hives that started a couple days ago. She started a new medication 3 days ago, Hyoscyamine Sulfate 0.125 and is not sure if that is the cause or not. She saw her doctor when the rash started and was given a cortisone cream but the rash has continued. She is still taking the medications.   Past Medical History  Diagnosis Date  . Diabetes mellitus   . Hypertension   . Hypokalemia   . Diverticulitis   . Hypercholesteremia    Past Surgical History  Procedure Laterality Date  . Cholecystectomy    . Abdominal hysterectomy    . Appendectomy    . Wisdom tooth extraction     Family History  Problem Relation Age of Onset  . Diabetes Mother   . Gout Mother   . Hypertension Mother   . Arthritis Mother   . Heart disease Father   . Arthritis Sister   . Heart disease Maternal Grandmother   . Diabetes Maternal Grandmother   . Heart disease Paternal Grandmother   . Diabetes Paternal Grandmother    History  Substance Use Topics  . Smoking status: Former Games developer  . Smokeless tobacco: Never Used  . Alcohol Use: Yes     Comment: occasional / social    OB History   Grav Para Term Preterm Abortions TAB SAB Ect Mult Living                 Review of Systems  Constitutional: Negative for fever and chills.  HENT: Negative for congestion, sore throat and trouble swallowing.   Eyes: Negative for redness and visual disturbance.  Respiratory: Negative for shortness of breath and wheezing.   Gastrointestinal: Negative for nausea and vomiting.  Genitourinary: Negative for dysuria.  Skin: Positive for rash.  Neurological: Negative for headaches.  Psychiatric/Behavioral: The patient is not nervous/anxious.       Allergies  Aspirin and Latex  Home Medications   Current Outpatient Rx  Name  Route  Sig  Dispense  Refill  . hyoscyamine (LEVSIN, ANASPAZ) 0.125 MG tablet   Oral   Take 0.125 mg by mouth every 4 (four) hours as needed.         . ciprofloxacin (CIPRO) 500 MG tablet   Oral   Take 1 tablet (500 mg total) by mouth 2 (two) times daily.   20 tablet   0   . Garcinia Cambogia-Chromium 500-200 MG-MCG TABS   Oral   Take 1 tablet by mouth daily.         Marland Kitchen glimepiride (AMARYL) 2 MG tablet   Oral   Take 2 mg by mouth daily before breakfast.         .  losartan (COZAAR) 50 MG tablet   Oral   Take 50 mg by mouth every morning.         . metroNIDAZOLE (FLAGYL) 500 MG tablet   Oral   Take 1 tablet (500 mg total) by mouth 3 (three) times daily.   30 tablet   0   . Multiple Vitamins-Minerals (HAIR/SKIN/NAILS PO)   Oral   Take 1 tablet by mouth daily.         . naproxen sodium (ALEVE) 220 MG tablet   Oral   Take 440 mg by mouth every 8 (eight) hours as needed (For pain.).         Marland Kitchen ondansetron (ZOFRAN) 4 MG tablet   Oral   Take 1 tablet (4 mg total) by mouth every 6 (six) hours.   12 tablet   0   . potassium chloride SA (K-DUR,KLOR-CON) 20 MEQ tablet   Oral   Take 20 mEq by mouth every morning.          . rosuvastatin (CRESTOR) 10 MG tablet   Oral   Take 10 mg by mouth at bedtime.          BP  129/87  Pulse 82  Temp(Src) 98.1 F (36.7 C) (Oral)  Resp 18  Ht 5\' 6"  (1.676 m)  Wt 183 lb (83.008 kg)  BMI 29.55 kg/m2  SpO2 96% Physical Exam  Nursing note and vitals reviewed. Constitutional: She is oriented to person, place, and time. She appears well-developed and well-nourished.  HENT:  Head: Normocephalic and atraumatic.  Eyes: EOM are normal.  Neck: Neck supple.  Cardiovascular: Normal rate and regular rhythm.   Pulmonary/Chest: Effort normal. She has no wheezes. She has no rales.  Musculoskeletal: Normal range of motion.  Neurological: She is alert and oriented to person, place, and time. No cranial nerve deficit.  Skin: Skin is warm and dry.  Hive like areas noted to chest, neck, upper back, abdomen lower extremities.   Psychiatric: She has a normal mood and affect. Her behavior is normal.    ED Course  Procedures Decadron 8 mg IM, Zyrtec 10 mg PO MDM  58 y.o. female with rash and itching x 2 days after starting new medication. She will stop the medication and take the medications as directed for allergic reaction. Stable for discharge without wheezing, shortness of breath or difficulty swallowing. Discussed with the patient and all questioned fully answered. She will return if any problems arise.    Medication List    TAKE these medications       cetirizine 10 MG tablet  Commonly known as:  ZYRTEC ALLERGY  Take 1 tablet (10 mg total) by mouth daily.     famotidine 20 MG tablet  Commonly known as:  PEPCID  Take 1 tablet (20 mg total) by mouth 2 (two) times daily.     predniSONE 10 MG tablet  Commonly known as:  DELTASONE  Starting tomorrow 10/12/2013 take 20 mg PO BID      ASK your doctor about these medications       ALEVE 220 MG tablet  Generic drug:  naproxen sodium  Take 440 mg by mouth every 8 (eight) hours as needed (For pain.).     ciprofloxacin 500 MG tablet  Commonly known as:  CIPRO  Take 1 tablet (500 mg total) by mouth 2 (two) times daily.      Garcinia Cambogia-Chromium 500-200 MG-MCG Tabs  Take 1 tablet by mouth daily.  glimepiride 2 MG tablet  Commonly known as:  AMARYL  Take 2 mg by mouth daily before breakfast.     HAIR/SKIN/NAILS PO  Take 1 tablet by mouth daily.     hyoscyamine 0.125 MG tablet  Commonly known as:  LEVSIN, ANASPAZ  Take 0.125 mg by mouth every 4 (four) hours as needed.     losartan 50 MG tablet  Commonly known as:  COZAAR  Take 50 mg by mouth every morning.     metroNIDAZOLE 500 MG tablet  Commonly known as:  FLAGYL  Take 1 tablet (500 mg total) by mouth 3 (three) times daily.     ondansetron 4 MG tablet  Commonly known as:  ZOFRAN  Take 1 tablet (4 mg total) by mouth every 6 (six) hours.     potassium chloride SA 20 MEQ tablet  Commonly known as:  K-DUR,KLOR-CON  Take 20 mEq by mouth every morning.     rosuvastatin 10 MG tablet  Commonly known as:  CRESTOR  Take 10 mg by mouth at bedtime.          [facility] Orlene OchM Kaislee Chao, [place]NP 10/11/13 1413

## 2013-10-23 DIAGNOSIS — H908 Mixed conductive and sensorineural hearing loss, unspecified: Secondary | ICD-10-CM | POA: Diagnosis not present

## 2013-11-04 DIAGNOSIS — N3020 Other chronic cystitis without hematuria: Secondary | ICD-10-CM | POA: Diagnosis not present

## 2013-11-04 DIAGNOSIS — R3915 Urgency of urination: Secondary | ICD-10-CM | POA: Diagnosis not present

## 2013-11-04 DIAGNOSIS — N949 Unspecified condition associated with female genital organs and menstrual cycle: Secondary | ICD-10-CM | POA: Diagnosis not present

## 2013-11-04 DIAGNOSIS — N3010 Interstitial cystitis (chronic) without hematuria: Secondary | ICD-10-CM | POA: Diagnosis not present

## 2013-12-05 DIAGNOSIS — E119 Type 2 diabetes mellitus without complications: Secondary | ICD-10-CM | POA: Diagnosis not present

## 2013-12-10 DIAGNOSIS — E785 Hyperlipidemia, unspecified: Secondary | ICD-10-CM | POA: Diagnosis not present

## 2013-12-10 DIAGNOSIS — I10 Essential (primary) hypertension: Secondary | ICD-10-CM | POA: Diagnosis not present

## 2013-12-10 DIAGNOSIS — F329 Major depressive disorder, single episode, unspecified: Secondary | ICD-10-CM | POA: Diagnosis not present

## 2013-12-10 DIAGNOSIS — K219 Gastro-esophageal reflux disease without esophagitis: Secondary | ICD-10-CM | POA: Diagnosis not present

## 2013-12-10 DIAGNOSIS — E119 Type 2 diabetes mellitus without complications: Secondary | ICD-10-CM | POA: Diagnosis not present

## 2013-12-10 DIAGNOSIS — F3289 Other specified depressive episodes: Secondary | ICD-10-CM | POA: Diagnosis not present

## 2013-12-10 DIAGNOSIS — R609 Edema, unspecified: Secondary | ICD-10-CM | POA: Diagnosis not present

## 2013-12-10 DIAGNOSIS — M545 Low back pain, unspecified: Secondary | ICD-10-CM | POA: Diagnosis not present

## 2013-12-10 DIAGNOSIS — E1142 Type 2 diabetes mellitus with diabetic polyneuropathy: Secondary | ICD-10-CM | POA: Diagnosis not present

## 2013-12-12 ENCOUNTER — Emergency Department (HOSPITAL_COMMUNITY): Payer: Medicare Other

## 2013-12-12 ENCOUNTER — Emergency Department (HOSPITAL_COMMUNITY)
Admission: EM | Admit: 2013-12-12 | Discharge: 2013-12-12 | Disposition: A | Discharge status: Home / Self Care | Payer: Medicare Other | Attending: Emergency Medicine | Admitting: Emergency Medicine

## 2013-12-12 ENCOUNTER — Encounter (HOSPITAL_COMMUNITY): Payer: Self-pay | Admitting: Emergency Medicine

## 2013-12-12 DIAGNOSIS — R059 Cough, unspecified: Secondary | ICD-10-CM | POA: Insufficient documentation

## 2013-12-12 DIAGNOSIS — R042 Hemoptysis: Secondary | ICD-10-CM | POA: Insufficient documentation

## 2013-12-12 DIAGNOSIS — R079 Chest pain, unspecified: Secondary | ICD-10-CM | POA: Diagnosis not present

## 2013-12-12 DIAGNOSIS — Z87891 Personal history of nicotine dependence: Secondary | ICD-10-CM | POA: Insufficient documentation

## 2013-12-12 DIAGNOSIS — Z79899 Other long term (current) drug therapy: Secondary | ICD-10-CM | POA: Diagnosis not present

## 2013-12-12 DIAGNOSIS — E119 Type 2 diabetes mellitus without complications: Secondary | ICD-10-CM | POA: Diagnosis not present

## 2013-12-12 DIAGNOSIS — I10 Essential (primary) hypertension: Secondary | ICD-10-CM | POA: Insufficient documentation

## 2013-12-12 DIAGNOSIS — Z7982 Long term (current) use of aspirin: Secondary | ICD-10-CM | POA: Diagnosis not present

## 2013-12-12 DIAGNOSIS — M79609 Pain in unspecified limb: Secondary | ICD-10-CM | POA: Insufficient documentation

## 2013-12-12 DIAGNOSIS — R112 Nausea with vomiting, unspecified: Secondary | ICD-10-CM | POA: Diagnosis not present

## 2013-12-12 DIAGNOSIS — Z8719 Personal history of other diseases of the digestive system: Secondary | ICD-10-CM | POA: Insufficient documentation

## 2013-12-12 DIAGNOSIS — R05 Cough: Secondary | ICD-10-CM | POA: Insufficient documentation

## 2013-12-12 DIAGNOSIS — Z91040 Latex allergy status: Secondary | ICD-10-CM | POA: Diagnosis not present

## 2013-12-12 LAB — CBC WITH DIFFERENTIAL/PLATELET
Basophils Absolute: 0 10*3/uL (ref 0.0–0.1)
Basophils Relative: 1 % (ref 0–1)
Eosinophils Absolute: 0.2 10*3/uL (ref 0.0–0.7)
Eosinophils Relative: 4 % (ref 0–5)
HCT: 37.8 % (ref 36.0–46.0)
Hemoglobin: 12.6 g/dL (ref 12.0–15.0)
Lymphocytes Relative: 45 % (ref 12–46)
Lymphs Abs: 2 10*3/uL (ref 0.7–4.0)
MCH: 28.6 pg (ref 26.0–34.0)
MCHC: 33.3 g/dL (ref 30.0–36.0)
MCV: 85.7 fL (ref 78.0–100.0)
Monocytes Absolute: 0.3 10*3/uL (ref 0.1–1.0)
Monocytes Relative: 7 % (ref 3–12)
Neutro Abs: 1.9 10*3/uL (ref 1.7–7.7)
Neutrophils Relative %: 43 % (ref 43–77)
Platelets: 164 10*3/uL (ref 150–400)
RBC: 4.41 MIL/uL (ref 3.87–5.11)
RDW: 13.9 % (ref 11.5–15.5)
WBC: 4.4 10*3/uL (ref 4.0–10.5)

## 2013-12-12 LAB — COMPREHENSIVE METABOLIC PANEL
ALT: 14 U/L (ref 0–35)
AST: 14 U/L (ref 0–37)
Albumin: 3 g/dL — ABNORMAL LOW (ref 3.5–5.2)
Alkaline Phosphatase: 92 U/L (ref 39–117)
BUN: 7 mg/dL (ref 6–23)
CO2: 28 mEq/L (ref 19–32)
Calcium: 8.5 mg/dL (ref 8.4–10.5)
Chloride: 106 mEq/L (ref 96–112)
Creatinine, Ser: 0.65 mg/dL (ref 0.50–1.10)
GFR calc Af Amer: >90 mL/min (ref >90)
GFR calc non Af Amer: >90 mL/min (ref >90)
Glucose, Bld: 91 mg/dL (ref 70–99)
Potassium: 3.7 mEq/L (ref 3.7–5.3)
Sodium: 143 mEq/L (ref 137–147)
Total Bilirubin: <0.2 mg/dL — ABNORMAL LOW (ref 0.3–1.2)
Total Protein: 6.6 g/dL (ref 6.0–8.3)

## 2013-12-12 LAB — TROPONIN I
Troponin I: <0.3 ng/mL (ref <0.30)
Troponin I: <0.3 ng/mL (ref <0.30)

## 2013-12-12 LAB — D-DIMER, QUANTITATIVE: D-Dimer, Quant: 0.27 ug/mL-FEU (ref 0.00–0.48)

## 2013-12-12 MED ORDER — OMEPRAZOLE 20 MG PO CPDR: 20.0000 mg | DELAYED_RELEASE_CAPSULE | Freq: Every day | ORAL | Status: DC

## 2013-12-12 NOTE — ED Notes (Signed)
PA Kelly at bedside. 

## 2013-12-12 NOTE — ED Provider Notes (Signed)
CSN: [id number]     Arrival date & time 12/12/13  0710 History   First MD Initiated Contact with Patient 12/12/13 0730     Chief Complaint  Patient presents with  . Chest Pain  . Hemoptysis     (Consider location/radiation/quality/duration/timing/severity/associated sxs/prior Treatment) HPI Comments: Patient is a 58 year old female with a history of hypertension, hyperlipidemia, and diabetes mellitus who presents to the emergency department today for hemoptysis. Patient states that she usually snores at night. She states that this morning she woke up because she was choking. Shortly after waking, patient experienced a coughing spell which was productive of clear phlegm streaked with blood. She denies any blood clots and states she had a few episodes of posttussive emesis. Patient denies coffee-ground emesis. She states that she has had some residual, nonradiating, chest pain since this time. She has had chest pain like this "on and off for a while". She states that she experiences associated tingling and discomfort in her right upper extremity as result of this chest pain. Patient denies any modifying factors of her symptoms today. She has not taken anything to attempt relief. She denies fever, syncope, shortness of breath, diarrhea, melena or hematochezia, numbness/tingling, extremity weakness, jaw pain or neck pain, shoulder pain, or new back pain.  Patient endorses having a stress test one year ago which was normal. Patient with heart cath (2005 and 2012) for evaluation of chest pain without evidence of CAD. She does have a FHx of CHF and CAD; brother with quadruple bypass x 2 for CAD. PCP - Osei Bonsu.   Patient is a 58 y.o. female presenting with chest pain. The history is provided by the patient. No language interpreter was used.  Chest Pain Associated symptoms: cough, nausea and vomiting   Associated symptoms: no dysphagia, no fever and no numbness     Past Medical History  Diagnosis Date   . Diabetes mellitus   . Hypertension   . Hypokalemia   . Diverticulitis   . Hypercholesteremia    Past Surgical History  Procedure Laterality Date  . Cholecystectomy    . Abdominal hysterectomy    . Appendectomy    . Wisdom tooth extraction     Family History  Problem Relation Age of Onset  . Diabetes Mother   . Gout Mother   . Hypertension Mother   . Arthritis Mother   . Heart disease Father   . Arthritis Sister   . Heart disease Maternal Grandmother   . Diabetes Maternal Grandmother   . Heart disease Paternal Grandmother   . Diabetes Paternal Grandmother    History  Substance Use Topics  . Smoking status: Former Games developermoker  . Smokeless tobacco: Never Used  . Alcohol Use: Yes     Comment: occasional / social   OB History   Grav Para Term Preterm Abortions TAB SAB Ect Mult Living                 Review of Systems  Constitutional: Negative for fever.  HENT: Negative for trouble swallowing.   Respiratory: Positive for cough and choking.   Cardiovascular: Positive for chest pain.  Gastrointestinal: Positive for nausea and vomiting. Negative for diarrhea and blood in stool.  Genitourinary: Negative for dysuria and hematuria.  Neurological: Negative for syncope and numbness.  All other systems reviewed and are negative.     Allergies  Aspirin and Latex  Home Medications   Prior to Admission medications   Medication Sig Start Date End Date Taking?  Authorizing Provider  aspirin EC 81 MG tablet Take 81 mg by mouth daily.   Yes Historical Provider, MD  glimepiride (AMARYL) 2 MG tablet Take 2 mg by mouth daily before breakfast.   Yes Historical Provider, MD  HYDROcodone-ibuprofen (VICOPROFEN) 7.5-200 MG per tablet Take 1 tablet by mouth every 8 (eight) hours as needed for moderate pain.   Yes Historical Provider, MD  losartan (COZAAR) 50 MG tablet Take 50 mg by mouth every morning.   Yes Historical Provider, MD  tiZANidine (ZANAFLEX) 2 MG tablet Take 2 mg by mouth 3  (three) times daily.   Yes Historical Provider, MD   BP 163/94  Pulse 67  Temp(Src) 97.6 F (36.4 C) (Oral)  Resp 17  SpO2 98%  Physical Exam  Nursing note and vitals reviewed. Constitutional: She is oriented to person, place, and time. She appears well-developed and well-nourished. No distress.  Patient in no acute distress. She is in no visible or audible discomfort.  HENT:  Head: Normocephalic and atraumatic.  Mouth/Throat: Oropharynx is clear and moist. No oropharyngeal exudate.  Eyes: Conjunctivae and EOM are normal. Pupils are equal, round, and reactive to light. No scleral icterus.  Neck: Normal range of motion. Neck supple.  Cardiovascular: Normal rate, regular rhythm and normal heart sounds.   No JVD  Pulmonary/Chest: Effort normal and breath sounds normal. No respiratory distress. She has no wheezes. She has no rales.  Abdominal: Soft. She exhibits no distension. There is no tenderness.  Soft and nontender without peritoneal signs  Musculoskeletal: Normal range of motion.  Neurological: She is alert and oriented to person, place, and time.  GCS 15. Patient moves extremities without ataxia.  Skin: Skin is warm and dry. No rash noted. She is not diaphoretic. No erythema. No pallor.  Psychiatric: She has a normal mood and affect. Her behavior is normal.    ED Course  Procedures (including critical care time) Labs Review Labs Reviewed  COMPREHENSIVE METABOLIC PANEL - Abnormal; Notable for the following:    Albumin 3.0 (*)    Total Bilirubin <0.2 (*)    All other components within normal limits  CBC WITH DIFFERENTIAL  TROPONIN I  D-DIMER, QUANTITATIVE  TROPONIN I    Imaging Review Dg Chest 2 View  12/12/2013   CLINICAL DATA:  58 year old female with chest pain. Symptoms awoke patient from sleep. Initial encounter.  EXAM: CHEST  2 VIEW  COMPARISON:  CT Abdomen and Pelvis 09/22/2013. Chest radiographs 03/12/2013 and earlier.  FINDINGS: Stable and normal lung volumes.  Stable cardiac size and mediastinal contours. Cardiac size at the upper limits of normal. Visualized tracheal air column is within normal limits. No pneumothorax, pulmonary edema, pleural effusion or confluent pulmonary opacity. No acute osseous abnormality identified. Right upper quadrant surgical clips unchanged.  IMPRESSION: Stable mild cardiomegaly.  No acute cardiopulmonary abnormality.   Electronically Signed   By: Augusto Gamble M.D.   On: 12/12/2013 08:27     Date: 12/12/2013  Rate: 66  Rhythm: normal sinus rhythm  QRS Axis: normal  Intervals: normal  ST/T Wave abnormalities: nonspecific ST changes  Conduction Disutrbances:none  Narrative Interpretation: NSR; no STEMI or ischemic change  Old EKG Reviewed: none available I have personally reviewed and interpreted this EKG   MDM   Final diagnoses:  Chest pain  Hemoptysis    58 year old female presents to the emergency department for chest pain, right arm pain, and hemoptysis after prolonged coughing spell and posttussive emesis. Patient well and nontoxic appearing on arrival.  She is hemodynamically stable. Patient has no history of ACS, though she does have risk factors and a family history. Cardiac work up today is negative. Get ACS in this patient given atypical nature of symptoms, reassuring workup, and heart score of 3. Patient has had negative cardiac catheterizations and stress tests in the past. Also doubt pulmonary embolism in this patient as well as aortic dissection. D-dimer is normal and chest x-ray shows no evidence of mediastinal widening. Doubt Boerhaave's as patient has not continued to have any hemoptysis or hematemesis. No signs of acute blood loss such as decreased H/H, tachycardia, hypotension, for hypoxia. Orthostatics stable. Low suspicion for emergent process as cause of symptoms today.  Patient stable and appropriate for discharge today with instruction to followup with her primary care provider. Patient does have a  history of reflux and has not been taking any reflux medications. Will prescribe omeprazole. Return precautions discussed and provided. Patient agreeable to plan with no unaddressed concerns   Filed Vitals:   12/12/13 1100 12/12/13 1145 12/12/13 1200 12/12/13 1215  BP: 163/94 142/80 134/84 149/84  Pulse: 67 74 67 71  Temp:      TempSrc:      Resp: 17 18 23 17   SpO2: 98% 98% 98% 97%     Antony MaduraKelly Braydyn Schultes, PA-C 12/12/13 1359

## 2013-12-12 NOTE — ED Notes (Signed)
Pt states that she started having CP after she had an episode where she woke up and felt like she was choking. Pt states that she vomited 1 time of red blood.

## 2013-12-12 NOTE — Discharge Instructions (Signed)
Chest Pain (Nonspecific) °It is often hard to give a specific diagnosis for the cause of chest pain. There is always a chance that your pain could be related to something serious, such as a heart attack or a blood clot in the lungs. You need to follow up with your caregiver for further evaluation. °CAUSES  °· Heartburn. °· Pneumonia or bronchitis. °· Anxiety or stress. °· Inflammation around your heart (pericarditis) or lung (pleuritis or pleurisy). °· A blood clot in the lung. °· A collapsed lung (pneumothorax). It can develop suddenly on its own (spontaneous pneumothorax) or from injury (trauma) to the chest. °· Shingles infection (herpes zoster virus). °The chest wall is composed of bones, muscles, and cartilage. Any of these can be the source of the pain. °· The bones can be bruised by injury. °· The muscles or cartilage can be strained by coughing or overwork. °· The cartilage can be affected by inflammation and become sore (costochondritis). °DIAGNOSIS  °Lab tests or other studies, such as X-rays, electrocardiography, stress testing, or cardiac imaging, may be needed to find the cause of your pain.  °TREATMENT  °· Treatment depends on what may be causing your chest pain. Treatment may include: °· Acid blockers for heartburn. °· Anti-inflammatory medicine. °· Pain medicine for inflammatory conditions. °· Antibiotics if an infection is present. °· You may be advised to change lifestyle habits. This includes stopping smoking and avoiding alcohol, caffeine, and chocolate. °· You may be advised to keep your head raised (elevated) when sleeping. This reduces the chance of acid going backward from your stomach into your esophagus. °· Most of the time, nonspecific chest pain will improve within 2 to 3 days with rest and mild pain medicine. °HOME CARE INSTRUCTIONS  °· If antibiotics were prescribed, take your antibiotics as directed. Finish them even if you start to feel better. °· For the next few days, avoid physical  activities that bring on chest pain. Continue physical activities as directed. °· Do not smoke. °· Avoid drinking alcohol. °· Only take over-the-counter or prescription medicine for pain, discomfort, or fever as directed by your caregiver. °· Follow your caregiver's suggestions for further testing if your chest pain does not go away. °· Keep any follow-up appointments you made. If you do not go to an appointment, you could develop lasting (chronic) problems with pain. If there is any problem keeping an appointment, you must call to reschedule. °SEEK MEDICAL CARE IF:  °· You think you are having problems from the medicine you are taking. Read your medicine instructions carefully. °· Your chest pain does not go away, even after treatment. °· You develop a rash with blisters on your chest. °SEEK IMMEDIATE MEDICAL CARE IF:  °· You have increased chest pain or pain that spreads to your arm, neck, jaw, back, or abdomen. °· You develop shortness of breath, an increasing cough, or you are coughing up blood. °· You have severe back or abdominal pain, feel nauseous, or vomit. °· You develop severe weakness, fainting, or chills. °· You have a fever. °THIS IS AN EMERGENCY. Do not wait to see if the pain will go away. Get medical help at once. Call your local emergency services (911 in [place] Do not drive yourself to the hospital. °MAKE SURE YOU:  °· Understand these instructions. °· Will watch your condition. °· Will get help right away if you are not doing well or get worse. °Document Released: 05/03/2005 Document Revised: 10/16/2011 Document Reviewed: 02/27/2008 °ExitCare® Patient Information ©2014 [employer],   LLC. Gastroesophageal Reflux Disease, Adult Gastroesophageal reflux disease (GERD) happens when acid from your stomach goes into your food pipe (esophagus). The acid can cause a burning feeling in your chest. Over time, the acid can make small holes (ulcers) in your food pipe.  HOME CARE  Ask your doctor for advice  about:  Losing weight.  Quitting smoking.  Alcohol use.  Avoid foods and drinks that make your problems worse. You may want to avoid:  Caffeine and alcohol.  Chocolate.  Mints.  Garlic and onions.  Spicy foods.  Citrus fruits, such as oranges, lemons, or limes.  Foods that contain tomato, such as sauce, chili, salsa, and pizza.  Fried and fatty foods.  Avoid lying down for 3 hours before you go to bed or before you take a nap.  Eat small meals often, instead of large meals.  Wear loose-fitting clothing. Do not wear anything tight around your waist.  Raise (elevate) the head of your bed 6 to 8 inches with wood blocks. Using extra pillows does not help.  Only take medicines as told by your doctor.  Do not take aspirin or ibuprofen. GET HELP RIGHT AWAY IF:   You have pain in your arms, neck, jaw, teeth, or back.  Your pain gets worse or changes.  You feel sick to your stomach (nauseous), throw up (vomit), or sweat (diaphoresis).  You feel short of breath, or you pass out (faint).  Your throw up is green, yellow, black, or looks like coffee grounds or blood.  Your poop (stool) is red, bloody, or black. MAKE SURE YOU:   Understand these instructions.  Will watch your condition.  Will get help right away if you are not doing well or get worse. Document Released: 01/10/2008 Document Revised: 10/16/2011 Document Reviewed: 02/10/2011 [facility] Inc[facility] Patient Information 2014 [place], [place].

## 2013-12-12 NOTE — ED Notes (Signed)
Advised pt that a repeat Troponin to be drawn at 11:30.

## 2013-12-15 NOTE — ED Provider Notes (Signed)
Medical screening examination/treatment/procedure(s) were performed by non-physician practitioner and as supervising physician I was immediately available for consultation/collaboration.   EKG Interpretation   Date/Time:  Friday Dec 12 2013 07:12:18 EDT Ventricular Rate:  66 PR Interval:  170 QRS Duration: 80 QT Interval:  418 QTC Calculation: 438 R Axis:   42 Text Interpretation:  Normal sinus rhythm Anteroseptal infarct , age  undetermined Abnormal ECG ED PHYSICIAN INTERPRETATION AVAILABLE IN [facility] Confirmed by TEST, Record ([id number]) on 12/14/2013 12:38:31 PM       Hurman HornJohn M Kingslee Mairena, MD 12/15/13 1447

## 2013-12-17 DIAGNOSIS — R0989 Other specified symptoms and signs involving the circulatory and respiratory systems: Secondary | ICD-10-CM | POA: Diagnosis not present

## 2013-12-17 DIAGNOSIS — R0609 Other forms of dyspnea: Secondary | ICD-10-CM | POA: Diagnosis not present

## 2013-12-17 DIAGNOSIS — R5381 Other malaise: Secondary | ICD-10-CM | POA: Diagnosis not present

## 2013-12-17 DIAGNOSIS — R5383 Other fatigue: Secondary | ICD-10-CM | POA: Diagnosis not present

## 2013-12-17 DIAGNOSIS — G4710 Hypersomnia, unspecified: Secondary | ICD-10-CM | POA: Diagnosis not present

## 2013-12-17 DIAGNOSIS — G4733 Obstructive sleep apnea (adult) (pediatric): Secondary | ICD-10-CM | POA: Diagnosis not present

## 2013-12-19 DIAGNOSIS — G4733 Obstructive sleep apnea (adult) (pediatric): Secondary | ICD-10-CM | POA: Diagnosis not present

## 2013-12-19 DIAGNOSIS — R5381 Other malaise: Secondary | ICD-10-CM | POA: Diagnosis not present

## 2013-12-19 DIAGNOSIS — R5383 Other fatigue: Secondary | ICD-10-CM | POA: Diagnosis not present

## 2013-12-19 DIAGNOSIS — G4710 Hypersomnia, unspecified: Secondary | ICD-10-CM | POA: Diagnosis not present

## 2013-12-22 ENCOUNTER — Emergency Department (HOSPITAL_COMMUNITY)
Admission: EM | Admit: 2013-12-22 | Discharge: 2013-12-23 | Disposition: A | Discharge status: Home / Self Care | Payer: Medicare Other | Attending: Emergency Medicine | Admitting: Emergency Medicine

## 2013-12-22 ENCOUNTER — Encounter (HOSPITAL_COMMUNITY): Payer: Self-pay | Admitting: Emergency Medicine

## 2013-12-22 DIAGNOSIS — I10 Essential (primary) hypertension: Secondary | ICD-10-CM | POA: Diagnosis not present

## 2013-12-22 DIAGNOSIS — E119 Type 2 diabetes mellitus without complications: Secondary | ICD-10-CM | POA: Diagnosis not present

## 2013-12-22 DIAGNOSIS — M545 Low back pain, unspecified: Secondary | ICD-10-CM | POA: Diagnosis not present

## 2013-12-22 DIAGNOSIS — K5900 Constipation, unspecified: Secondary | ICD-10-CM | POA: Insufficient documentation

## 2013-12-22 DIAGNOSIS — R110 Nausea: Secondary | ICD-10-CM | POA: Insufficient documentation

## 2013-12-22 DIAGNOSIS — Z87891 Personal history of nicotine dependence: Secondary | ICD-10-CM | POA: Diagnosis not present

## 2013-12-22 DIAGNOSIS — K644 Residual hemorrhoidal skin tags: Secondary | ICD-10-CM | POA: Diagnosis not present

## 2013-12-22 DIAGNOSIS — Z91040 Latex allergy status: Secondary | ICD-10-CM | POA: Insufficient documentation

## 2013-12-22 DIAGNOSIS — R109 Unspecified abdominal pain: Secondary | ICD-10-CM | POA: Insufficient documentation

## 2013-12-22 DIAGNOSIS — Z79899 Other long term (current) drug therapy: Secondary | ICD-10-CM | POA: Insufficient documentation

## 2013-12-22 DIAGNOSIS — R1030 Lower abdominal pain, unspecified: Secondary | ICD-10-CM

## 2013-12-22 DIAGNOSIS — Z7982 Long term (current) use of aspirin: Secondary | ICD-10-CM | POA: Diagnosis not present

## 2013-12-22 LAB — COMPREHENSIVE METABOLIC PANEL
ALT: 17 U/L (ref 0–35)
AST: 15 U/L (ref 0–37)
Albumin: 3.8 g/dL (ref 3.5–5.2)
Alkaline Phosphatase: 108 U/L (ref 39–117)
BUN: 9 mg/dL (ref 6–23)
CO2: 29 mEq/L (ref 19–32)
Calcium: 9.7 mg/dL (ref 8.4–10.5)
Chloride: 99 mEq/L (ref 96–112)
Creatinine, Ser: 0.66 mg/dL (ref 0.50–1.10)
GFR calc Af Amer: >90 mL/min (ref >90)
GFR calc non Af Amer: >90 mL/min (ref >90)
Glucose, Bld: 87 mg/dL (ref 70–99)
Potassium: 3.5 mEq/L — ABNORMAL LOW (ref 3.7–5.3)
Sodium: 139 mEq/L (ref 137–147)
Total Bilirubin: 0.3 mg/dL (ref 0.3–1.2)
Total Protein: 8.1 g/dL (ref 6.0–8.3)

## 2013-12-22 LAB — CBC WITH DIFFERENTIAL/PLATELET
Basophils Absolute: 0 10*3/uL (ref 0.0–0.1)
Basophils Relative: 0 % (ref 0–1)
Eosinophils Absolute: 0.2 10*3/uL (ref 0.0–0.7)
Eosinophils Relative: 3 % (ref 0–5)
HCT: 40.2 % (ref 36.0–46.0)
Hemoglobin: 13.9 g/dL (ref 12.0–15.0)
Lymphocytes Relative: 29 % (ref 12–46)
Lymphs Abs: 2.1 10*3/uL (ref 0.7–4.0)
MCH: 28.7 pg (ref 26.0–34.0)
MCHC: 34.6 g/dL (ref 30.0–36.0)
MCV: 83.1 fL (ref 78.0–100.0)
Monocytes Absolute: 0.5 10*3/uL (ref 0.1–1.0)
Monocytes Relative: 7 % (ref 3–12)
Neutro Abs: 4.6 10*3/uL (ref 1.7–7.7)
Neutrophils Relative %: 61 % (ref 43–77)
Platelets: 210 10*3/uL (ref 150–400)
RBC: 4.84 MIL/uL (ref 3.87–5.11)
RDW: 13.4 % (ref 11.5–15.5)
WBC: 7.5 10*3/uL (ref 4.0–10.5)

## 2013-12-22 LAB — URINALYSIS, ROUTINE W REFLEX MICROSCOPIC
Bilirubin Urine: NEGATIVE
Glucose, UA: NEGATIVE mg/dL
Hgb urine dipstick: NEGATIVE
Ketones, ur: NEGATIVE mg/dL
Leukocytes, UA: NEGATIVE
Nitrite: NEGATIVE
Protein, ur: NEGATIVE mg/dL
Specific Gravity, Urine: 1.017 (ref 1.005–1.030)
Urobilinogen, UA: 0.2 mg/dL (ref 0.0–1.0)
pH: 7 (ref 5.0–8.0)

## 2013-12-22 LAB — POC OCCULT BLOOD, ED: Fecal Occult Bld: NEGATIVE

## 2013-12-22 LAB — LIPASE, BLOOD: Lipase: 19 U/L (ref 11–59)

## 2013-12-22 MED ORDER — MORPHINE SULFATE 4 MG/ML IJ SOLN: 4.0000 mg | Freq: Once | INTRAMUSCULAR | Status: DC

## 2013-12-22 NOTE — ED Provider Notes (Signed)
CSN: [id number]     Arrival date & time 12/22/13  2032 History   First MD Initiated Contact with Patient 12/22/13 2234     Chief Complaint  Patient presents with  . Abdominal Pain  . Rectal Pain     (Consider location/radiation/quality/duration/timing/severity/associated sxs/prior Treatment) HPI Pt is a 58yo female with hx of DM, HTN, hypokalemia, diverticulitis and hypercholesteremia c/o lower back pain radiating into lower abdomen x3-4 days. Pain is sharp in nature, constant, 10/10 at worst. Reports taking vicoprofen and tizanidine prescribed by her PCP last week for low back pain but states medication only puts her to sleep. Does not help with pain. Pt also c/o nausea but denies vomiting or diarrhea.  Pt reports using a douche 5-6 days ago that caused minimal vaginal bleeding that resolved within 24 hours.  Pt states she had 3 small BMs today that were hard and painful. Denies blood in stool. Denies dysuria, hematuria, or vaginal symptoms at this time. Denies fever. Denies trauma to back.    Pt does report hx of same, states she has been seen by several specialist for similar symptoms which is why her gallbladder had been removed. Pt is seen by Dr. Elnoria HowardHung, GI. Pt believes she has f/u appointment coming up soon.   Past Medical History  Diagnosis Date  . Diabetes mellitus   . Hypertension   . Hypokalemia   . Diverticulitis   . Hypercholesteremia    Past Surgical History  Procedure Laterality Date  . Cholecystectomy    . Abdominal hysterectomy    . Appendectomy    . Wisdom tooth extraction     Family History  Problem Relation Age of Onset  . Diabetes Mother   . Gout Mother   . Hypertension Mother   . Arthritis Mother   . Heart disease Father   . Arthritis Sister   . Heart disease Maternal Grandmother   . Diabetes Maternal Grandmother   . Heart disease Paternal Grandmother   . Diabetes Paternal Grandmother    History  Substance Use Topics  . Smoking status: Former Games developermoker   . Smokeless tobacco: Never Used  . Alcohol Use: Yes     Comment: occasional / social   OB History   Grav Para Term Preterm Abortions TAB SAB Ect Mult Living                 Review of Systems  Constitutional: Negative for fever and chills.  Gastrointestinal: Positive for nausea, abdominal pain, constipation and rectal pain. Negative for vomiting, diarrhea and blood in stool.  Endocrine: Positive for polydipsia. Negative for polyphagia and polyuria.  Genitourinary: Negative for dysuria, urgency, frequency, hematuria, flank pain, decreased urine volume, vaginal bleeding, vaginal discharge and pelvic pain.  All other systems reviewed and are negative.     Allergies  Aspirin and Latex  Home Medications   Prior to Admission medications   Medication Sig Start Date End Date Taking? Authorizing Provider  aspirin EC 81 MG tablet Take 81 mg by mouth every morning.    Yes Historical Provider, MD  glimepiride (AMARYL) 4 MG tablet Take 4 mg by mouth daily with breakfast.   Yes Historical Provider, MD  HYDROcodone-ibuprofen (VICOPROFEN) 7.5-200 MG per tablet Take 1 tablet by mouth every 8 (eight) hours as needed for moderate pain.   Yes Historical Provider, MD  lactobacillus acidophilus (BACID) TABS tablet Take 2 tablets by mouth every morning.   Yes Historical Provider, MD  losartan (COZAAR) 50 MG tablet Take 50  mg by mouth every morning.   Yes Historical Provider, MD  omeprazole (PRILOSEC) 20 MG capsule Take 1 capsule (20 mg total) by mouth daily. 12/12/13  Yes Antony MaduraKelly Humes, PA-C  tiZANidine (ZANAFLEX) 2 MG tablet Take 2-4 mg by mouth 3 (three) times daily as needed for muscle spasms (pain).    Yes Historical Provider, MD   BP 158/81  Pulse 73  Temp(Src) 98 F (36.7 C) (Oral)  Resp 16  Ht 5\' 6"  (1.676 m)  Wt 187 lb (84.823 kg)  BMI 30.20 kg/m2  SpO2 99% Physical Exam  Nursing note and vitals reviewed. Constitutional: She appears well-developed and well-nourished. No distress.  HENT:   Head: Normocephalic and atraumatic.  Eyes: Conjunctivae are normal. No scleral icterus.  Neck: Normal range of motion.  Cardiovascular: Normal rate, regular rhythm and normal heart sounds.   Pulmonary/Chest: Effort normal and breath sounds normal. No respiratory distress. She has no wheezes. She has no rales. She exhibits no tenderness.  Abdominal: Soft. Bowel sounds are normal. She exhibits no distension and no mass. There is no tenderness. There is no rebound and no guarding.  Obese abdomen, soft, non-tender, no rebound guarding or masses  Genitourinary:  Chaperoned exam. Rectal exam-external hemorrhoids present. No rectal bleeding. No fecal impaction or mass palpated.   Musculoskeletal: Normal range of motion.  Neurological: She is alert.  Skin: Skin is warm and dry. She is not diaphoretic.    ED Course  Procedures (including critical care time) Labs Review Labs Reviewed  COMPREHENSIVE METABOLIC PANEL - Abnormal; Notable for the following:    Potassium 3.5 (*)    All other components within normal limits  CBC WITH DIFFERENTIAL  LIPASE, BLOOD  URINALYSIS, ROUTINE W REFLEX MICROSCOPIC  POC OCCULT BLOOD, ED    Imaging Review No results found.   EKG Interpretation None      MDM   Final diagnoses:  Lower abdominal pain  Low back pain  Nausea  External hemorrhoids  Constipation     Pt with hx of abdominal pain c/o low back pain, low abdominal pain and nausea over last 3-4 days. Reports being seen by Dr. Elnoria HowardHung, GI for similar symptoms. Denies urinary or vaginal symptoms.  Labs: unremarkable. On rectal exam-external hemorrhoids present, no fecal impaction. Hemoccult negative.  Not concerned for surgical abdomen including SBO, perforation, or diverticulitis.   Discussed pt with Dr. Ranae PalmsYelverton, pt may be discharged home to f/u with Dr. Elnoria HowardHung and PCP. Return precautions provided. Pt verbalized understanding and agreement with tx plan.     Junius FinnerErin O'Malley, PA-C 12/23/13 [phone]

## 2013-12-22 NOTE — ED Notes (Signed)
Pt states that she has abdominal pain, lower back pain and rectal pain x 3-4 days; pt states that she used a douche 5-6 days ago; pt c/o nausea with no vomiting; pt denies diarrhea; pt c/o increase thirst; pt states that she has had 3 BM's today that would add up to a normal BM, pt states that the BM was painful.

## 2013-12-22 NOTE — ED Notes (Signed)
Pt made aware of need for urine sample but states she is unable to provide on that this time.

## 2013-12-23 MED ORDER — MORPHINE SULFATE 4 MG/ML IJ SOLN
4.0000 mg | Freq: Once | INTRAMUSCULAR | Status: AC
  Administered 2013-12-23: 4 mg via INTRAMUSCULAR
  Filled 2013-12-23: qty 1

## 2013-12-23 MED ORDER — POLYETHYLENE GLYCOL 3350 17 GM/SCOOP PO POWD: 17.0000 g | Freq: Two times a day (BID) | ORAL | Status: DC

## 2013-12-23 NOTE — ED Provider Notes (Signed)
Medical screening examination/treatment/procedure(s) were performed by non-physician practitioner and as supervising physician I was immediately available for consultation/collaboration.   EKG Interpretation None        Zavia Pullen, MD 12/23/13 0348 

## 2013-12-23 NOTE — Discharge Instructions (Signed)
Continue taking pain Vicoprofen and zanaflex as prescribed by your primary care provider for low back pain. You may take mirilax to help soften stools, helps with constipation.  Follow up with your family doctor for continued pain.  Return to ER for NEW or worsening symptoms including fever, unable to keep down fluids, severe pain not controlled with pain medications. See below for further instructions.   Abdominal Pain, Adult Many things can cause abdominal pain. Usually, abdominal pain is not caused by a disease and will improve without treatment. It can often be observed and treated at home. Your health care provider will do a physical exam and possibly order blood tests and X-rays to help determine the seriousness of your pain. However, in many cases, more time must pass before a clear cause of the pain can be found. Before that point, your health care provider may not know if you need more testing or further treatment. HOME CARE INSTRUCTIONS  Monitor your abdominal pain for any changes. The following actions may help to alleviate any discomfort you are experiencing:  Only take over-the-counter or prescription medicines as directed by your health care provider.  Do not take laxatives unless directed to do so by your health care provider.  Try a clear liquid diet (broth, tea, or water) as directed by your health care provider. Slowly move to a bland diet as tolerated. SEEK MEDICAL CARE IF:  You have unexplained abdominal pain.  You have abdominal pain associated with nausea or diarrhea.  You have pain when you urinate or have a bowel movement.  You experience abdominal pain that wakes you in the night.  You have abdominal pain that is worsened or improved by eating food.  You have abdominal pain that is worsened with eating fatty foods. SEEK IMMEDIATE MEDICAL CARE IF:   Your pain does not go away within 2 hours.  You have a fever.  You keep throwing up (vomiting).  Your pain is  felt only in portions of the abdomen, such as the right side or the left lower portion of the abdomen.  You pass bloody or black tarry stools. MAKE SURE YOU:  Understand these instructions.   Will watch your condition.   Will get help right away if you are not doing well or get worse.  Document Released: 05/03/2005 Document Revised: 05/14/2013 Document Reviewed: 04/02/2013 [facility], [facility], [facility] Patient Information 2014 [place], [place].

## 2013-12-24 DIAGNOSIS — K602 Anal fissure, unspecified: Secondary | ICD-10-CM | POA: Diagnosis not present

## 2013-12-24 DIAGNOSIS — K625 Hemorrhage of anus and rectum: Secondary | ICD-10-CM | POA: Diagnosis not present

## 2013-12-24 DIAGNOSIS — K6289 Other specified diseases of anus and rectum: Secondary | ICD-10-CM | POA: Diagnosis not present

## 2013-12-26 ENCOUNTER — Emergency Department (HOSPITAL_COMMUNITY)
Admission: EM | Admit: 2013-12-26 | Discharge: 2013-12-26 | Disposition: A | Discharge status: Home / Self Care | Payer: Medicare Other | Attending: Emergency Medicine | Admitting: Emergency Medicine

## 2013-12-26 ENCOUNTER — Encounter (HOSPITAL_COMMUNITY): Payer: Self-pay | Admitting: Emergency Medicine

## 2013-12-26 ENCOUNTER — Emergency Department (HOSPITAL_COMMUNITY): Payer: Medicare Other

## 2013-12-26 DIAGNOSIS — R0789 Other chest pain: Secondary | ICD-10-CM | POA: Insufficient documentation

## 2013-12-26 DIAGNOSIS — R51 Headache: Secondary | ICD-10-CM | POA: Diagnosis not present

## 2013-12-26 DIAGNOSIS — Z7982 Long term (current) use of aspirin: Secondary | ICD-10-CM | POA: Insufficient documentation

## 2013-12-26 DIAGNOSIS — R519 Headache, unspecified: Secondary | ICD-10-CM

## 2013-12-26 DIAGNOSIS — I10 Essential (primary) hypertension: Secondary | ICD-10-CM | POA: Diagnosis not present

## 2013-12-26 DIAGNOSIS — Z87891 Personal history of nicotine dependence: Secondary | ICD-10-CM | POA: Insufficient documentation

## 2013-12-26 DIAGNOSIS — Z8719 Personal history of other diseases of the digestive system: Secondary | ICD-10-CM | POA: Diagnosis not present

## 2013-12-26 DIAGNOSIS — E119 Type 2 diabetes mellitus without complications: Secondary | ICD-10-CM | POA: Insufficient documentation

## 2013-12-26 DIAGNOSIS — Z79899 Other long term (current) drug therapy: Secondary | ICD-10-CM | POA: Diagnosis not present

## 2013-12-26 DIAGNOSIS — E876 Hypokalemia: Secondary | ICD-10-CM | POA: Diagnosis not present

## 2013-12-26 DIAGNOSIS — J9819 Other pulmonary collapse: Secondary | ICD-10-CM | POA: Diagnosis not present

## 2013-12-26 LAB — CBC
HCT: 38.8 % (ref 36.0–46.0)
Hemoglobin: 12.9 g/dL (ref 12.0–15.0)
MCH: 28.5 pg (ref 26.0–34.0)
MCHC: 33.2 g/dL (ref 30.0–36.0)
MCV: 85.8 fL (ref 78.0–100.0)
Platelets: 209 10*3/uL (ref 150–400)
RBC: 4.52 MIL/uL (ref 3.87–5.11)
RDW: 13.5 % (ref 11.5–15.5)
WBC: 4 10*3/uL (ref 4.0–10.5)

## 2013-12-26 LAB — BASIC METABOLIC PANEL
BUN: 9 mg/dL (ref 6–23)
CO2: 29 mEq/L (ref 19–32)
Calcium: 9.7 mg/dL (ref 8.4–10.5)
Chloride: 102 mEq/L (ref 96–112)
Creatinine, Ser: 0.75 mg/dL (ref 0.50–1.10)
GFR calc Af Amer: >90 mL/min (ref >90)
GFR calc non Af Amer: >90 mL/min (ref >90)
Glucose, Bld: 128 mg/dL — ABNORMAL HIGH (ref 70–99)
Potassium: 3.2 mEq/L — ABNORMAL LOW (ref 3.7–5.3)
Sodium: 142 mEq/L (ref 137–147)

## 2013-12-26 LAB — I-STAT TROPONIN, ED
Troponin i, poc: 0 ng/mL (ref 0.00–0.08)
Troponin i, poc: 0 ng/mL (ref 0.00–0.08)

## 2013-12-26 MED ORDER — SODIUM CHLORIDE 0.9 % IV BOLUS (SEPSIS)
1000.0000 mL | Freq: Once | INTRAVENOUS | Status: AC
  Administered 2013-12-26: 1000 mL via INTRAVENOUS

## 2013-12-26 MED ORDER — POTASSIUM CHLORIDE CRYS ER 20 MEQ PO TBCR
40.0000 meq | EXTENDED_RELEASE_TABLET | Freq: Once | ORAL | Status: AC
  Administered 2013-12-26: 40 meq via ORAL
  Filled 2013-12-26: qty 2

## 2013-12-26 MED ORDER — METOCLOPRAMIDE HCL 5 MG/ML IJ SOLN
10.0000 mg | Freq: Once | INTRAMUSCULAR | Status: AC
  Administered 2013-12-26: 10 mg via INTRAVENOUS
  Filled 2013-12-26: qty 2

## 2013-12-26 MED ORDER — DIPHENHYDRAMINE HCL 50 MG/ML IJ SOLN
25.0000 mg | Freq: Once | INTRAMUSCULAR | Status: AC
  Administered 2013-12-26: 25 mg via INTRAVENOUS
  Filled 2013-12-26: qty 1

## 2013-12-26 MED ORDER — KETOROLAC TROMETHAMINE 30 MG/ML IJ SOLN
30.0000 mg | Freq: Once | INTRAMUSCULAR | Status: AC
  Administered 2013-12-26: 30 mg via INTRAVENOUS
  Filled 2013-12-26: qty 1

## 2013-12-26 NOTE — Discharge Instructions (Signed)
Chest Pain (Nonspecific) °It is often hard to give a specific diagnosis for the cause of chest pain. There is always a chance that your pain could be related to something serious, such as a heart attack or a blood clot in the lungs. You need to follow up with your caregiver for further evaluation. °CAUSES  °· Heartburn. °· Pneumonia or bronchitis. °· Anxiety or stress. °· Inflammation around your heart (pericarditis) or lung (pleuritis or pleurisy). °· A blood clot in the lung. °· A collapsed lung (pneumothorax). It can develop suddenly on its own (spontaneous pneumothorax) or from injury (trauma) to the chest. °· Shingles infection (herpes zoster virus). °The chest wall is composed of bones, muscles, and cartilage. Any of these can be the source of the pain. °· The bones can be bruised by injury. °· The muscles or cartilage can be strained by coughing or overwork. °· The cartilage can be affected by inflammation and become sore (costochondritis). °DIAGNOSIS  °Lab tests or other studies, such as X-rays, electrocardiography, stress testing, or cardiac imaging, may be needed to find the cause of your pain.  °TREATMENT  °· Treatment depends on what may be causing your chest pain. Treatment may include: °· Acid blockers for heartburn. °· Anti-inflammatory medicine. °· Pain medicine for inflammatory conditions. °· Antibiotics if an infection is present. °· You may be advised to change lifestyle habits. This includes stopping smoking and avoiding alcohol, caffeine, and chocolate. °· You may be advised to keep your head raised (elevated) when sleeping. This reduces the chance of acid going backward from your stomach into your esophagus. °· Most of the time, nonspecific chest pain will improve within 2 to 3 days with rest and mild pain medicine. °HOME CARE INSTRUCTIONS  °· If antibiotics were prescribed, take your antibiotics as directed. Finish them even if you start to feel better. °· For the next few days, avoid physical  activities that bring on chest pain. Continue physical activities as directed. °· Do not smoke. °· Avoid drinking alcohol. °· Only take over-the-counter or prescription medicine for pain, discomfort, or fever as directed by your caregiver. °· Follow your caregiver's suggestions for further testing if your chest pain does not go away. °· Keep any follow-up appointments you made. If you do not go to an appointment, you could develop lasting (chronic) problems with pain. If there is any problem keeping an appointment, you must call to reschedule. °SEEK MEDICAL CARE IF:  °· You think you are having problems from the medicine you are taking. Read your medicine instructions carefully. °· Your chest pain does not go away, even after treatment. °· You develop a rash with blisters on your chest. °SEEK IMMEDIATE MEDICAL CARE IF:  °· You have increased chest pain or pain that spreads to your arm, neck, jaw, back, or abdomen. °· You develop shortness of breath, an increasing cough, or you are coughing up blood. °· You have severe back or abdominal pain, feel nauseous, or vomit. °· You develop severe weakness, fainting, or chills. °· You have a fever. °THIS IS AN EMERGENCY. Do not wait to see if the pain will go away. Get medical help at once. Call your local emergency services (911 in [place] Do not drive yourself to the hospital. °MAKE SURE YOU:  °· Understand these instructions. °· Will watch your condition. °· Will get help right away if you are not doing well or get worse. °Document Released: 05/03/2005 Document Revised: 10/16/2011 Document Reviewed: 02/27/2008 °ExitCare® Patient Information ©2014 [employer],   LLC. ° °Headaches, Frequently Asked Questions °MIGRAINE HEADACHES °Q: What is migraine? What causes it? How can I treat it? °A: Generally, migraine headaches begin as a dull ache. Then they develop into a constant, throbbing, and pulsating pain. You may experience pain at the temples. You may experience pain at the front  or back of one or both sides of the head. The pain is usually accompanied by a combination of: °· Nausea. °· Vomiting. °· Sensitivity to light and noise. °Some people (about 15%) experience an aura (see below) before an attack. The cause of migraine is believed to be chemical reactions in the brain. Treatment for migraine may include over-the-counter or prescription medications. It may also include self-help techniques. These include relaxation training and biofeedback.  °Q: What is an aura? °A: About 15% of people with migraine get an "aura". This is a sign of neurological symptoms that occur before a migraine headache. You may see wavy or jagged lines, dots, or flashing lights. You might experience tunnel vision or blind spots in one or both eyes. The aura can include visual or auditory hallucinations (something imagined). It may include disruptions in smell (such as strange odors), taste or touch. Other symptoms include: °· Numbness. °· A "pins and needles" sensation. °· Difficulty in recalling or speaking the correct word. °These neurological events may last as long as 60 minutes. These symptoms will fade as the headache begins. °Q: What is a trigger? °A: Certain physical or environmental factors can lead to or "trigger" a migraine. These include: °· Foods. °· Hormonal changes. °· Weather. °· Stress. °It is important to remember that triggers are different for everyone. To help prevent migraine attacks, you need to figure out which triggers affect you. Keep a headache diary. This is a good way to track triggers. The diary will help you talk to your healthcare professional about your condition. °Q: Does weather affect migraines? °A: Bright sunshine, hot, humid conditions, and drastic changes in barometric pressure may lead to, or "trigger," a migraine attack in some people. But studies have shown that weather does not act as a trigger for everyone with migraines. °Q: What is the link between migraine and  hormones? °A: Hormones start and regulate many of your body's functions. Hormones keep your body in balance within a constantly changing environment. The levels of hormones in your body are unbalanced at times. Examples are during menstruation, pregnancy, or menopause. That can lead to a migraine attack. In fact, about three quarters of all women with migraine report that their attacks are related to the menstrual cycle.  °Q: Is there an increased risk of stroke for migraine sufferers? °A: The likelihood of a migraine attack causing a stroke is very remote. That is not to say that migraine sufferers cannot have a stroke associated with their migraines. In persons under age 40, the most common associated factor for stroke is migraine headache. But over the course of a person's normal life span, the occurrence of migraine headache may actually be associated with a reduced risk of dying from cerebrovascular disease due to stroke.  °Q: What are acute medications for migraine? °A: Acute medications are used to treat the pain of the headache after it has started. Examples over-the-counter medications, NSAIDs, ergots, and triptans.  °Q: What are the triptans? °A: Triptans are the newest class of abortive medications. They are specifically targeted to treat migraine. Triptans are vasoconstrictors. They moderate some chemical reactions in the brain. The triptans work on receptors in your   brain. Triptans help to restore the balance of a neurotransmitter called serotonin. Fluctuations in levels of serotonin are thought to be a main cause of migraine.  °Q: Are over-the-counter medications for migraine effective? °A: Over-the-counter, or "OTC," medications may be effective in relieving mild to moderate pain and associated symptoms of migraine. But you should see your caregiver before beginning any treatment regimen for migraine.  °Q: What are preventive medications for migraine? °A: Preventive medications for migraine are  sometimes referred to as "prophylactic" treatments. They are used to reduce the frequency, severity, and length of migraine attacks. Examples of preventive medications include antiepileptic medications, antidepressants, beta-blockers, calcium channel blockers, and NSAIDs (nonsteroidal anti-inflammatory drugs). °Q: Why are anticonvulsants used to treat migraine? °A: During the past few years, there has been an increased interest in antiepileptic drugs for the prevention of migraine. They are sometimes referred to as "anticonvulsants". Both epilepsy and migraine may be caused by similar reactions in the brain.  °Q: Why are antidepressants used to treat migraine? °A: Antidepressants are typically used to treat people with depression. They may reduce migraine frequency by regulating chemical levels, such as serotonin, in the brain.  °Q: What alternative therapies are used to treat migraine? °A: The term "alternative therapies" is often used to describe treatments considered outside the scope of conventional Western medicine. Examples of alternative therapy include acupuncture, acupressure, and yoga. Another common alternative treatment is herbal therapy. Some herbs are believed to relieve headache pain. Always discuss alternative therapies with your caregiver before proceeding. Some herbal products contain arsenic and other toxins. °TENSION HEADACHES °Q: What is a tension-type headache? What causes it? How can I treat it? °A: Tension-type headaches occur randomly. They are often the result of temporary stress, anxiety, fatigue, or anger. Symptoms include soreness in your temples, a tightening band-like sensation around your head (a "vice-like" ache). Symptoms can also include a pulling feeling, pressure sensations, and contracting head and neck muscles. The headache begins in your forehead, temples, or the back of your head and neck. Treatment for tension-type headache may include over-the-counter or prescription  medications. Treatment may also include self-help techniques such as relaxation training and biofeedback. °CLUSTER HEADACHES °Q: What is a cluster headache? What causes it? How can I treat it? °A: Cluster headache gets its name because the attacks come in groups. The pain arrives with little, if any, warning. It is usually on one side of the head. A tearing or bloodshot eye and a runny nose on the same side of the headache may also accompany the pain. Cluster headaches are believed to be caused by chemical reactions in the brain. They have been described as the most severe and intense of any headache type. Treatment for cluster headache includes prescription medication and oxygen. °SINUS HEADACHES °Q: What is a sinus headache? What causes it? How can I treat it? °A: When a cavity in the bones of the face and skull (a sinus) becomes inflamed, the inflammation will cause localized pain. This condition is usually the result of an allergic reaction, a tumor, or an infection. If your headache is caused by a sinus blockage, such as an infection, you will probably have a fever. An x-ray will confirm a sinus blockage. Your caregiver's treatment might include antibiotics for the infection, as well as antihistamines or decongestants.  °REBOUND HEADACHES °Q: What is a rebound headache? What causes it? How can I treat it? °A: A pattern of taking acute headache medications too often can lead to a condition known   as "rebound headache." A pattern of taking too much headache medication includes taking it more than 2 days per week or in excessive amounts. That means more than the label or a caregiver advises. With rebound headaches, your medications not only stop relieving pain, they actually begin to cause headaches. Doctors treat rebound headache by tapering the medication that is being overused. Sometimes your caregiver will gradually substitute a different type of treatment or medication. Stopping may be a challenge. Regularly  overusing a medication increases the potential for serious side effects. Consult a caregiver if you regularly use headache medications more than 2 days per week or more than the label advises. °ADDITIONAL QUESTIONS AND ANSWERS °Q: What is biofeedback? °A: Biofeedback is a self-help treatment. Biofeedback uses special equipment to monitor your body's involuntary physical responses. Biofeedback monitors: °· Breathing. °· Pulse. °· Heart rate. °· Temperature. °· Muscle tension. °· Brain activity. °Biofeedback helps you refine and perfect your relaxation exercises. You learn to control the physical responses that are related to stress. Once the technique has been mastered, you do not need the equipment any more. °Q: Are headaches hereditary? °A: Four out of five (80%) of people that suffer report a family history of migraine. Scientists are not sure if this is genetic or a family predisposition. Despite the uncertainty, a child has a 50% chance of having migraine if one parent suffers. The child has a 75% chance if both parents suffer.  °Q: Can children get headaches? °A: By the time they reach high school, most young people have experienced some type of headache. Many safe and effective approaches or medications can prevent a headache from occurring or stop it after it has begun.  °Q: What type of doctor should I see to diagnose and treat my headache? °A: Start with your primary caregiver. Discuss his or her experience and approach to headaches. Discuss methods of classification, diagnosis, and treatment. Your caregiver may decide to recommend you to a headache specialist, depending upon your symptoms or other physical conditions. Having diabetes, allergies, etc., may require a more comprehensive and inclusive approach to your headache. The [employer] will provide, upon request, a list of NHF physician members in your state. °Document Released: 10/14/2003 Document Revised: 10/16/2011 Document  Reviewed: 03/23/2008 °ExitCare® Patient Information ©2014 [employer]. ° °

## 2013-12-26 NOTE — ED Notes (Signed)
Per pt sts that she has been having issues with hypertension and hasn't been taking her BP meds in 1 month. sts chest pain, SOB, and pain down left arm.

## 2013-12-26 NOTE — ED Provider Notes (Signed)
CSN: [id number]     Arrival date & time 12/26/13  1330 History   First MD Initiated Contact with Patient 12/26/13 1551     Chief Complaint  Patient presents with  . Chest Pain  . Hypertension     (Consider location/radiation/quality/duration/timing/severity/associated sxs/prior Treatment) Patient is a 58 y.o. female presenting with chest pain. The history is provided by the patient.  Chest Pain Pain location:  L lateral chest Pain quality: sharp   Radiates to: Continuous pain from the left side of her head all the way down to her neck and left arm. Pain radiates to the back: no   Pain severity:  Moderate Onset quality:  Gradual Duration:  1 day Timing:  Constant Progression:  Waxing and waning Chronicity:  New Context: at rest   Context: not breathing   Relieved by:  Nothing Worsened by:  Nothing tried Ineffective treatments:  None tried Associated symptoms: headache (For 3 days)   Associated symptoms: no abdominal pain, no altered mental status, no back pain, no dizziness, no nausea, no near-syncope, no syncope and not vomiting     Past Medical History  Diagnosis Date  . Diabetes mellitus   . Hypertension   . Hypokalemia   . Diverticulitis   . Hypercholesteremia    Past Surgical History  Procedure Laterality Date  . Cholecystectomy    . Abdominal hysterectomy    . Appendectomy    . Wisdom tooth extraction     Family History  Problem Relation Age of Onset  . Diabetes Mother   . Gout Mother   . Hypertension Mother   . Arthritis Mother   . Heart disease Father   . Arthritis Sister   . Heart disease Maternal Grandmother   . Diabetes Maternal Grandmother   . Heart disease Paternal Grandmother   . Diabetes Paternal Grandmother    History  Substance Use Topics  . Smoking status: Former Games developer  . Smokeless tobacco: Never Used  . Alcohol Use: Yes     Comment: occasional / social   OB History   Grav Para Term Preterm Abortions TAB SAB Ect Mult Living            Review of Systems  Cardiovascular: Positive for chest pain. Negative for syncope and near-syncope.  Gastrointestinal: Negative for nausea, vomiting and abdominal pain.  Musculoskeletal: Negative for back pain.  Neurological: Positive for headaches (For 3 days). Negative for dizziness.  All other systems reviewed and are negative.     Allergies  Aspirin and Latex  Home Medications   Prior to Admission medications   Medication Sig Start Date End Date Taking? Authorizing Provider  aspirin EC 81 MG tablet Take 81 mg by mouth every morning.    Yes Historical Provider, MD  glimepiride (AMARYL) 4 MG tablet Take 4 mg by mouth daily with breakfast.   Yes Historical Provider, MD  HYDROcodone-ibuprofen (VICOPROFEN) 7.5-200 MG per tablet Take 1 tablet by mouth every 8 (eight) hours as needed for moderate pain.   Yes Historical Provider, MD  lactobacillus acidophilus (BACID) TABS tablet Take 1 tablet by mouth every morning.    Yes Historical Provider, MD  losartan (COZAAR) 50 MG tablet Take 50 mg by mouth every morning.   Yes Historical Provider, MD  omeprazole (PRILOSEC) 20 MG capsule Take 1 capsule (20 mg total) by mouth daily. 12/12/13  Yes Antony Madura, PA-C  polyethylene glycol powder (GLYCOLAX/MIRALAX) powder Take 17 g by mouth 2 (two) times daily as needed for mild constipation.  Until daily soft stools  OTC 12/23/13  Yes Junius FinnerErin O'Malley, PA-C  PRESCRIPTION MEDICATION Apply 1 application topically 3 (three) times daily. Nitroglycerin ointment 0.125%   Yes Historical Provider, MD  tiZANidine (ZANAFLEX) 2 MG tablet Take 2-4 mg by mouth 3 (three) times daily as needed for muscle spasms (pain).    Yes Historical Provider, MD   BP 175/104  Pulse 71  Temp(Src) 98.1 F (36.7 C) (Oral)  Resp 18  SpO2 98% Physical Exam  Constitutional: She is oriented to person, place, and time. She appears well-developed and well-nourished. No distress.  HENT:  Head: Normocephalic.  Eyes: Conjunctivae  are normal.  Neck: Neck supple. No tracheal deviation present.  Cardiovascular: Normal rate, regular rhythm and normal heart sounds.   Pulmonary/Chest: Effort normal and breath sounds normal. No respiratory distress.  Abdominal: Soft. She exhibits no distension.  Neurological: She is alert and oriented to person, place, and time. She has normal strength. No cranial nerve deficit or sensory deficit. Coordination and gait normal. GCS eye subscore is 4. GCS verbal subscore is 5. GCS motor subscore is 6.  Skin: Skin is warm and dry.  Psychiatric: She has a normal mood and affect.    ED Course  Procedures (including critical care time) Labs Review Labs Reviewed  BASIC METABOLIC PANEL - Abnormal; Notable for the following:    Potassium 3.2 (*)    Glucose, Bld 128 (*)    All other components within normal limits  CBC  I-STAT TROPOININ, ED    Imaging Review Dg Chest 2 View  12/26/2013   CLINICAL DATA:  Chest pain and hypertension  EXAM: CHEST  2 VIEW  COMPARISON:  Chest radiograph 12/12/2013  FINDINGS: Mild enlargement of the cardiac silhouette is stable. The thoracic aorta and hilar contours are stable. Pulmonary vascularity is normal. The trachea is midline. The lungs are normal in volume. There is minimal atelectasis at both lung bases. Negative for airspace disease, effusion, or pneumothorax. No acute or suspicious bony abnormality. Cholecystectomy clips present.  IMPRESSION: Stable mild cardiomegaly. Other than minimal bibasilar atelectasis, no acute findings identified.   Electronically Signed   By: Britta MccreedySusan  Turner M.D.   On: 12/26/2013 14:27     EKG Interpretation None      MDM   Final diagnoses:  Headache  Atypical chest pain  Hypokalemia   58 y.o. female presents with chest pain started yesterday evening. Headache for 3 days. Headache worse than chest pain currently, feels like they run together. She has described a sensation of sharp, tingling, and diffuseness to the pain that  is continuous with her both her head arm and neck. After a migraine cocktail is administered the patient has resolution of symptoms, I believe that this episode can be treated to a complicated migraine. It appears that her discomfort was not cardiac in origin with no EKG changes and a negative troponin. Discussed followup with her primary care physician for further evaluation and management. Her potassium was repleted here as is running slightly low and could have been been contributing to her symptoms.  EKG without ST or T wave changes concerning for myocardial ischemia. No delta wave, no prolonged QTc, no brugada to suggest arrhythmogenicity.      Lyndal Pulleyaniel Moiz Ryant, MD 12/27/13 [id number]

## 2013-12-28 NOTE — ED Provider Notes (Signed)
I saw and evaluated the patient, reviewed the resident's note and I agree with the findings and plan. Patient is a 58 year old female who presents with complaints of discomfort in the left side of her chest that radiates to the left side of her head and arm. This is sharp in nature and is not associated with shortness of breath, diaphoresis. There is no relation to exertion. She has a history of migraine headaches but this feels somewhat different. She has no prior cardiac history but does have several risk factors.  On exam, vitals are stable and she is afebrile. Head is atraumatic, normocephalic. Neck is supple. Heart is regular rate and rhythm. Lungs are clear and equal. Abdomen is benign. Neurologically, cranial nerves II through XII are grossly intact. Strength is 5 out of 5 in the bilateral upper and lower extremities.  This appears to be some sort of complex migraine headache. She is given a migraine cocktail and is feeling better. Her symptoms are atypical for cardiac pain and her EKG and troponin are unremarkable. At this point I feel as though discharge is appropriate. She understands to return if her symptoms substantially worsen or change.   EKG Interpretation   Date/Time:  Friday Dec 26 2013 13:35:44 EDT Ventricular Rate:  68 PR Interval:  174 QRS Duration: 78 QT Interval:  404 QTC Calculation: 429 R Axis:   1 Text Interpretation:  Normal sinus rhythm Anterior infarct , age  undetermined Abnormal ECG No significant change since 12/12/13 Confirmed by  Malva Cogan  MD, Shalice Woodring ([id number]) on 12/28/2013 12:22:46 AM        Geoffery Lyons, MD 12/28/13 [id number]

## 2014-01-08 DIAGNOSIS — IMO0002 Reserved for concepts with insufficient information to code with codable children: Secondary | ICD-10-CM | POA: Diagnosis not present

## 2014-01-08 DIAGNOSIS — N949 Unspecified condition associated with female genital organs and menstrual cycle: Secondary | ICD-10-CM | POA: Diagnosis not present

## 2014-01-12 DIAGNOSIS — G609 Hereditary and idiopathic neuropathy, unspecified: Secondary | ICD-10-CM | POA: Diagnosis not present

## 2014-01-12 DIAGNOSIS — M79609 Pain in unspecified limb: Secondary | ICD-10-CM | POA: Diagnosis not present

## 2014-01-12 DIAGNOSIS — B351 Tinea unguium: Secondary | ICD-10-CM | POA: Diagnosis not present

## 2014-01-12 DIAGNOSIS — IMO0002 Reserved for concepts with insufficient information to code with codable children: Secondary | ICD-10-CM | POA: Diagnosis not present

## 2014-01-13 DIAGNOSIS — N3010 Interstitial cystitis (chronic) without hematuria: Secondary | ICD-10-CM | POA: Diagnosis not present

## 2014-01-13 DIAGNOSIS — IMO0002 Reserved for concepts with insufficient information to code with codable children: Secondary | ICD-10-CM | POA: Diagnosis not present

## 2014-01-13 DIAGNOSIS — L293 Anogenital pruritus, unspecified: Secondary | ICD-10-CM | POA: Diagnosis not present

## 2014-01-13 DIAGNOSIS — N898 Other specified noninflammatory disorders of vagina: Secondary | ICD-10-CM | POA: Diagnosis not present

## 2014-01-13 DIAGNOSIS — Z8742 Personal history of other diseases of the female genital tract: Secondary | ICD-10-CM | POA: Diagnosis not present

## 2014-01-21 DIAGNOSIS — K602 Anal fissure, unspecified: Secondary | ICD-10-CM | POA: Diagnosis not present

## 2014-01-21 DIAGNOSIS — K6289 Other specified diseases of anus and rectum: Secondary | ICD-10-CM | POA: Diagnosis not present

## 2014-01-21 DIAGNOSIS — R141 Gas pain: Secondary | ICD-10-CM | POA: Diagnosis not present

## 2014-01-23 DIAGNOSIS — M7150 Other bursitis, not elsewhere classified, unspecified site: Secondary | ICD-10-CM | POA: Diagnosis not present

## 2014-01-23 DIAGNOSIS — IMO0002 Reserved for concepts with insufficient information to code with codable children: Secondary | ICD-10-CM | POA: Diagnosis not present

## 2014-01-23 DIAGNOSIS — G5750 Tarsal tunnel syndrome, unspecified lower limb: Secondary | ICD-10-CM | POA: Diagnosis not present

## 2014-01-28 DIAGNOSIS — M545 Low back pain, unspecified: Secondary | ICD-10-CM | POA: Diagnosis not present

## 2014-01-28 DIAGNOSIS — E119 Type 2 diabetes mellitus without complications: Secondary | ICD-10-CM | POA: Diagnosis not present

## 2014-01-28 DIAGNOSIS — F3289 Other specified depressive episodes: Secondary | ICD-10-CM | POA: Diagnosis not present

## 2014-01-28 DIAGNOSIS — E785 Hyperlipidemia, unspecified: Secondary | ICD-10-CM | POA: Diagnosis not present

## 2014-01-28 DIAGNOSIS — K219 Gastro-esophageal reflux disease without esophagitis: Secondary | ICD-10-CM | POA: Diagnosis not present

## 2014-01-28 DIAGNOSIS — E1142 Type 2 diabetes mellitus with diabetic polyneuropathy: Secondary | ICD-10-CM | POA: Diagnosis not present

## 2014-01-28 DIAGNOSIS — F329 Major depressive disorder, single episode, unspecified: Secondary | ICD-10-CM | POA: Diagnosis not present

## 2014-01-28 DIAGNOSIS — I10 Essential (primary) hypertension: Secondary | ICD-10-CM | POA: Diagnosis not present

## 2014-01-28 DIAGNOSIS — R609 Edema, unspecified: Secondary | ICD-10-CM | POA: Diagnosis not present

## 2014-02-11 DIAGNOSIS — G5610 Other lesions of median nerve, unspecified upper limb: Secondary | ICD-10-CM | POA: Diagnosis not present

## 2014-02-11 DIAGNOSIS — M79609 Pain in unspecified limb: Secondary | ICD-10-CM | POA: Diagnosis not present

## 2014-02-11 DIAGNOSIS — R209 Unspecified disturbances of skin sensation: Secondary | ICD-10-CM | POA: Diagnosis not present

## 2014-02-11 DIAGNOSIS — M545 Low back pain, unspecified: Secondary | ICD-10-CM | POA: Diagnosis not present

## 2014-02-11 DIAGNOSIS — G609 Hereditary and idiopathic neuropathy, unspecified: Secondary | ICD-10-CM | POA: Diagnosis not present

## 2014-02-11 DIAGNOSIS — R634 Abnormal weight loss: Secondary | ICD-10-CM | POA: Diagnosis not present

## 2014-02-11 DIAGNOSIS — B351 Tinea unguium: Secondary | ICD-10-CM | POA: Diagnosis not present

## 2014-02-11 DIAGNOSIS — IMO0002 Reserved for concepts with insufficient information to code with codable children: Secondary | ICD-10-CM | POA: Diagnosis not present

## 2014-02-11 DIAGNOSIS — R51 Headache: Secondary | ICD-10-CM | POA: Diagnosis not present

## 2014-02-18 DIAGNOSIS — K219 Gastro-esophageal reflux disease without esophagitis: Secondary | ICD-10-CM | POA: Diagnosis not present

## 2014-02-18 DIAGNOSIS — E119 Type 2 diabetes mellitus without complications: Secondary | ICD-10-CM | POA: Diagnosis not present

## 2014-02-18 DIAGNOSIS — E785 Hyperlipidemia, unspecified: Secondary | ICD-10-CM | POA: Diagnosis not present

## 2014-02-18 DIAGNOSIS — I10 Essential (primary) hypertension: Secondary | ICD-10-CM | POA: Diagnosis not present

## 2014-02-18 DIAGNOSIS — F329 Major depressive disorder, single episode, unspecified: Secondary | ICD-10-CM | POA: Diagnosis not present

## 2014-02-18 DIAGNOSIS — R609 Edema, unspecified: Secondary | ICD-10-CM | POA: Diagnosis not present

## 2014-02-18 DIAGNOSIS — F3289 Other specified depressive episodes: Secondary | ICD-10-CM | POA: Diagnosis not present

## 2014-02-18 DIAGNOSIS — E1142 Type 2 diabetes mellitus with diabetic polyneuropathy: Secondary | ICD-10-CM | POA: Diagnosis not present

## 2014-02-18 DIAGNOSIS — R300 Dysuria: Secondary | ICD-10-CM | POA: Diagnosis not present

## 2014-02-25 DIAGNOSIS — IMO0002 Reserved for concepts with insufficient information to code with codable children: Secondary | ICD-10-CM | POA: Diagnosis not present

## 2014-02-25 DIAGNOSIS — E119 Type 2 diabetes mellitus without complications: Secondary | ICD-10-CM | POA: Diagnosis not present

## 2014-02-25 DIAGNOSIS — N760 Acute vaginitis: Secondary | ICD-10-CM | POA: Diagnosis not present

## 2014-02-25 DIAGNOSIS — A499 Bacterial infection, unspecified: Secondary | ICD-10-CM | POA: Diagnosis not present

## 2014-02-25 DIAGNOSIS — Z01419 Encounter for gynecological examination (general) (routine) without abnormal findings: Secondary | ICD-10-CM | POA: Diagnosis not present

## 2014-02-25 DIAGNOSIS — B9689 Other specified bacterial agents as the cause of diseases classified elsewhere: Secondary | ICD-10-CM | POA: Diagnosis not present

## 2014-02-25 DIAGNOSIS — N898 Other specified noninflammatory disorders of vagina: Secondary | ICD-10-CM | POA: Diagnosis not present

## 2014-02-26 DIAGNOSIS — G5610 Other lesions of median nerve, unspecified upper limb: Secondary | ICD-10-CM | POA: Diagnosis not present

## 2014-02-26 DIAGNOSIS — R51 Headache: Secondary | ICD-10-CM | POA: Diagnosis not present

## 2014-02-26 DIAGNOSIS — M545 Low back pain, unspecified: Secondary | ICD-10-CM | POA: Diagnosis not present

## 2014-02-26 DIAGNOSIS — G609 Hereditary and idiopathic neuropathy, unspecified: Secondary | ICD-10-CM | POA: Diagnosis not present

## 2014-02-26 DIAGNOSIS — IMO0002 Reserved for concepts with insufficient information to code with codable children: Secondary | ICD-10-CM | POA: Diagnosis not present

## 2014-03-03 DIAGNOSIS — Z1212 Encounter for screening for malignant neoplasm of rectum: Secondary | ICD-10-CM | POA: Diagnosis not present

## 2014-03-11 DIAGNOSIS — M79609 Pain in unspecified limb: Secondary | ICD-10-CM | POA: Diagnosis not present

## 2014-03-11 DIAGNOSIS — IMO0002 Reserved for concepts with insufficient information to code with codable children: Secondary | ICD-10-CM | POA: Diagnosis not present

## 2014-03-11 DIAGNOSIS — B351 Tinea unguium: Secondary | ICD-10-CM | POA: Diagnosis not present

## 2014-03-11 DIAGNOSIS — N3020 Other chronic cystitis without hematuria: Secondary | ICD-10-CM | POA: Diagnosis not present

## 2014-03-18 DIAGNOSIS — R609 Edema, unspecified: Secondary | ICD-10-CM | POA: Diagnosis not present

## 2014-03-18 DIAGNOSIS — E785 Hyperlipidemia, unspecified: Secondary | ICD-10-CM | POA: Diagnosis not present

## 2014-03-18 DIAGNOSIS — F329 Major depressive disorder, single episode, unspecified: Secondary | ICD-10-CM | POA: Diagnosis not present

## 2014-03-18 DIAGNOSIS — F3289 Other specified depressive episodes: Secondary | ICD-10-CM | POA: Diagnosis not present

## 2014-03-18 DIAGNOSIS — E1159 Type 2 diabetes mellitus with other circulatory complications: Secondary | ICD-10-CM | POA: Diagnosis not present

## 2014-03-18 DIAGNOSIS — K219 Gastro-esophageal reflux disease without esophagitis: Secondary | ICD-10-CM | POA: Diagnosis not present

## 2014-03-18 DIAGNOSIS — I10 Essential (primary) hypertension: Secondary | ICD-10-CM | POA: Diagnosis not present

## 2014-03-18 DIAGNOSIS — E1142 Type 2 diabetes mellitus with diabetic polyneuropathy: Secondary | ICD-10-CM | POA: Diagnosis not present

## 2014-03-18 DIAGNOSIS — Z5181 Encounter for therapeutic drug level monitoring: Secondary | ICD-10-CM | POA: Diagnosis not present

## 2014-03-18 DIAGNOSIS — E119 Type 2 diabetes mellitus without complications: Secondary | ICD-10-CM | POA: Diagnosis not present

## 2014-03-18 DIAGNOSIS — R300 Dysuria: Secondary | ICD-10-CM | POA: Diagnosis not present

## 2014-03-18 DIAGNOSIS — Z7982 Long term (current) use of aspirin: Secondary | ICD-10-CM | POA: Diagnosis not present

## 2014-03-18 DIAGNOSIS — Z79899 Other long term (current) drug therapy: Secondary | ICD-10-CM | POA: Diagnosis not present

## 2014-03-25 DIAGNOSIS — G4730 Sleep apnea, unspecified: Secondary | ICD-10-CM | POA: Diagnosis not present

## 2014-04-01 DIAGNOSIS — H93299 Other abnormal auditory perceptions, unspecified ear: Secondary | ICD-10-CM | POA: Diagnosis not present

## 2014-04-01 DIAGNOSIS — R0609 Other forms of dyspnea: Secondary | ICD-10-CM | POA: Diagnosis not present

## 2014-04-01 DIAGNOSIS — I10 Essential (primary) hypertension: Secondary | ICD-10-CM | POA: Diagnosis not present

## 2014-04-01 DIAGNOSIS — H902 Conductive hearing loss, unspecified: Secondary | ICD-10-CM | POA: Diagnosis not present

## 2014-04-01 DIAGNOSIS — F3289 Other specified depressive episodes: Secondary | ICD-10-CM | POA: Diagnosis not present

## 2014-04-01 DIAGNOSIS — K219 Gastro-esophageal reflux disease without esophagitis: Secondary | ICD-10-CM | POA: Diagnosis not present

## 2014-04-01 DIAGNOSIS — E876 Hypokalemia: Secondary | ICD-10-CM | POA: Diagnosis not present

## 2014-04-01 DIAGNOSIS — R51 Headache: Secondary | ICD-10-CM | POA: Diagnosis not present

## 2014-04-01 DIAGNOSIS — E119 Type 2 diabetes mellitus without complications: Secondary | ICD-10-CM | POA: Diagnosis not present

## 2014-04-01 DIAGNOSIS — E785 Hyperlipidemia, unspecified: Secondary | ICD-10-CM | POA: Diagnosis not present

## 2014-04-01 DIAGNOSIS — F329 Major depressive disorder, single episode, unspecified: Secondary | ICD-10-CM | POA: Diagnosis not present

## 2014-04-01 DIAGNOSIS — E1142 Type 2 diabetes mellitus with diabetic polyneuropathy: Secondary | ICD-10-CM | POA: Diagnosis not present

## 2014-04-08 DIAGNOSIS — G4733 Obstructive sleep apnea (adult) (pediatric): Secondary | ICD-10-CM | POA: Diagnosis not present

## 2014-04-08 DIAGNOSIS — R5381 Other malaise: Secondary | ICD-10-CM | POA: Diagnosis not present

## 2014-04-08 DIAGNOSIS — R0609 Other forms of dyspnea: Secondary | ICD-10-CM | POA: Diagnosis not present

## 2014-04-08 DIAGNOSIS — R5383 Other fatigue: Secondary | ICD-10-CM | POA: Diagnosis not present

## 2014-04-08 DIAGNOSIS — G4710 Hypersomnia, unspecified: Secondary | ICD-10-CM | POA: Diagnosis not present

## 2014-04-15 DIAGNOSIS — G5610 Other lesions of median nerve, unspecified upper limb: Secondary | ICD-10-CM | POA: Diagnosis not present

## 2014-04-15 DIAGNOSIS — E1142 Type 2 diabetes mellitus with diabetic polyneuropathy: Secondary | ICD-10-CM | POA: Diagnosis not present

## 2014-04-15 DIAGNOSIS — R51 Headache: Secondary | ICD-10-CM | POA: Diagnosis not present

## 2014-04-15 DIAGNOSIS — IMO0002 Reserved for concepts with insufficient information to code with codable children: Secondary | ICD-10-CM | POA: Diagnosis not present

## 2014-04-15 DIAGNOSIS — E1149 Type 2 diabetes mellitus with other diabetic neurological complication: Secondary | ICD-10-CM | POA: Diagnosis not present

## 2014-04-15 DIAGNOSIS — G609 Hereditary and idiopathic neuropathy, unspecified: Secondary | ICD-10-CM | POA: Diagnosis not present

## 2014-05-06 DIAGNOSIS — E119 Type 2 diabetes mellitus without complications: Secondary | ICD-10-CM | POA: Diagnosis not present

## 2014-05-06 DIAGNOSIS — K219 Gastro-esophageal reflux disease without esophagitis: Secondary | ICD-10-CM | POA: Diagnosis not present

## 2014-05-06 DIAGNOSIS — R51 Headache: Secondary | ICD-10-CM | POA: Diagnosis not present

## 2014-05-06 DIAGNOSIS — R609 Edema, unspecified: Secondary | ICD-10-CM | POA: Diagnosis not present

## 2014-05-06 DIAGNOSIS — F329 Major depressive disorder, single episode, unspecified: Secondary | ICD-10-CM | POA: Diagnosis not present

## 2014-05-06 DIAGNOSIS — E1142 Type 2 diabetes mellitus with diabetic polyneuropathy: Secondary | ICD-10-CM | POA: Diagnosis not present

## 2014-05-06 DIAGNOSIS — I10 Essential (primary) hypertension: Secondary | ICD-10-CM | POA: Diagnosis not present

## 2014-05-06 DIAGNOSIS — E785 Hyperlipidemia, unspecified: Secondary | ICD-10-CM | POA: Diagnosis not present

## 2014-05-06 DIAGNOSIS — F3289 Other specified depressive episodes: Secondary | ICD-10-CM | POA: Diagnosis not present

## 2014-05-12 DIAGNOSIS — G4733 Obstructive sleep apnea (adult) (pediatric): Secondary | ICD-10-CM | POA: Diagnosis not present

## 2014-05-26 DIAGNOSIS — G4733 Obstructive sleep apnea (adult) (pediatric): Secondary | ICD-10-CM | POA: Diagnosis not present

## 2014-06-03 DIAGNOSIS — E119 Type 2 diabetes mellitus without complications: Secondary | ICD-10-CM | POA: Diagnosis not present

## 2014-06-03 DIAGNOSIS — D649 Anemia, unspecified: Secondary | ICD-10-CM | POA: Diagnosis not present

## 2014-06-03 DIAGNOSIS — R5383 Other fatigue: Secondary | ICD-10-CM | POA: Diagnosis not present

## 2014-06-03 DIAGNOSIS — F329 Major depressive disorder, single episode, unspecified: Secondary | ICD-10-CM | POA: Diagnosis not present

## 2014-06-03 DIAGNOSIS — E785 Hyperlipidemia, unspecified: Secondary | ICD-10-CM | POA: Diagnosis not present

## 2014-06-03 DIAGNOSIS — I10 Essential (primary) hypertension: Secondary | ICD-10-CM | POA: Diagnosis not present

## 2014-06-03 DIAGNOSIS — K219 Gastro-esophageal reflux disease without esophagitis: Secondary | ICD-10-CM | POA: Diagnosis not present

## 2014-06-03 DIAGNOSIS — E0840 Diabetes mellitus due to underlying condition with diabetic neuropathy, unspecified: Secondary | ICD-10-CM | POA: Diagnosis not present

## 2014-06-03 DIAGNOSIS — E876 Hypokalemia: Secondary | ICD-10-CM | POA: Diagnosis not present

## 2014-06-03 DIAGNOSIS — R079 Chest pain, unspecified: Secondary | ICD-10-CM | POA: Diagnosis not present

## 2014-06-10 DIAGNOSIS — E1140 Type 2 diabetes mellitus with diabetic neuropathy, unspecified: Secondary | ICD-10-CM | POA: Diagnosis not present

## 2014-06-17 DIAGNOSIS — G44229 Chronic tension-type headache, not intractable: Secondary | ICD-10-CM | POA: Diagnosis not present

## 2014-06-17 DIAGNOSIS — R5383 Other fatigue: Secondary | ICD-10-CM | POA: Diagnosis not present

## 2014-06-17 DIAGNOSIS — M5416 Radiculopathy, lumbar region: Secondary | ICD-10-CM | POA: Diagnosis not present

## 2014-06-17 DIAGNOSIS — G603 Idiopathic progressive neuropathy: Secondary | ICD-10-CM | POA: Diagnosis not present

## 2014-06-17 DIAGNOSIS — R0683 Snoring: Secondary | ICD-10-CM | POA: Diagnosis not present

## 2014-06-17 DIAGNOSIS — G4710 Hypersomnia, unspecified: Secondary | ICD-10-CM | POA: Diagnosis not present

## 2014-06-17 DIAGNOSIS — G5602 Carpal tunnel syndrome, left upper limb: Secondary | ICD-10-CM | POA: Diagnosis not present

## 2014-06-17 DIAGNOSIS — G4733 Obstructive sleep apnea (adult) (pediatric): Secondary | ICD-10-CM | POA: Diagnosis not present

## 2014-07-08 ENCOUNTER — Encounter: Payer: Self-pay | Admitting: Cardiovascular Disease

## 2014-07-08 ENCOUNTER — Ambulatory Visit (INDEPENDENT_AMBULATORY_CARE_PROVIDER_SITE_OTHER): Payer: Medicare Other | Admitting: Cardiovascular Disease

## 2014-07-08 VITALS — BP 130/86 | HR 77 | Ht 66.0 in | Wt 186.0 lb

## 2014-07-08 DIAGNOSIS — I10 Essential (primary) hypertension: Secondary | ICD-10-CM | POA: Diagnosis not present

## 2014-07-08 DIAGNOSIS — I2510 Atherosclerotic heart disease of native coronary artery without angina pectoris: Secondary | ICD-10-CM | POA: Diagnosis not present

## 2014-07-08 DIAGNOSIS — Z9989 Dependence on other enabling machines and devices: Secondary | ICD-10-CM | POA: Insufficient documentation

## 2014-07-08 DIAGNOSIS — E782 Mixed hyperlipidemia: Secondary | ICD-10-CM

## 2014-07-08 DIAGNOSIS — E119 Type 2 diabetes mellitus without complications: Secondary | ICD-10-CM | POA: Diagnosis not present

## 2014-07-08 DIAGNOSIS — Z79899 Other long term (current) drug therapy: Secondary | ICD-10-CM | POA: Diagnosis not present

## 2014-07-08 DIAGNOSIS — R0789 Other chest pain: Secondary | ICD-10-CM

## 2014-07-08 DIAGNOSIS — E1140 Type 2 diabetes mellitus with diabetic neuropathy, unspecified: Secondary | ICD-10-CM

## 2014-07-08 DIAGNOSIS — G4733 Obstructive sleep apnea (adult) (pediatric): Secondary | ICD-10-CM | POA: Insufficient documentation

## 2014-07-08 DIAGNOSIS — R079 Chest pain, unspecified: Secondary | ICD-10-CM | POA: Insufficient documentation

## 2014-07-08 DIAGNOSIS — I2583 Coronary atherosclerosis due to lipid rich plaque: Secondary | ICD-10-CM

## 2014-07-08 DIAGNOSIS — E785 Hyperlipidemia, unspecified: Secondary | ICD-10-CM | POA: Insufficient documentation

## 2014-07-08 DIAGNOSIS — E1142 Type 2 diabetes mellitus with diabetic polyneuropathy: Secondary | ICD-10-CM

## 2014-07-08 MED ORDER — AMLODIPINE BESYLATE 5 MG PO TABS: 5.0000 mg | ORAL_TABLET | Freq: Every day | ORAL | Status: DC

## 2014-07-08 NOTE — Patient Instructions (Signed)
Your physician has recommended you make the following change in your medication: start new prescription for amlodipine. This has already been sent to your pharmacy.  Your physician has requested that you have an echocardiogram. Echocardiography is a painless test that uses sound waves to create images of your heart. It provides your doctor with information about the size and shape of your heart and how well your heart's chambers and valves are working. This procedure takes approximately one hour. There are no restrictions for this procedure.  Your physician recommends that you return for lab work fasting.  Your physician has requested that you have a lexiscan myoview. For further information please visit [email or web address]. Please follow instruction sheet, as given.  Your physician recommends that you schedule a follow-up appointment in: 6 weeks with Dr. Tresa EndoKelly.

## 2014-07-08 NOTE — Progress Notes (Addendum)
Patient ID: Bonnie Gordon, female   DOB: November 08, 1955, 58 y.o.   MRN: [id number]     PATIENT PROFILE: Bonnie Gordon is a 58 y.o. female who is followed by Dr. Dennison Nancy for primary care.  I had remotely seen the patient almost 10 years ago.  She now presents to the office to reestablish cardiology care with me with complaints of recurrent episodes of chest pain.   HPI:  Bonnie Gordon is a 58 y.o. female who I had seen in 2004 after she had experienced chest pressure and was evaluated at [facility] emergency room.  A nuclear perfusion study raised the possibility of anterior to inferolateral wall ischemia.  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  She was treated aggressively with statin therapy in attempt to induce plaque or aggression.  I have not seen her since 2006.  Apparently, she has undergone a second heart catheterization which was done by Dr. Allyson Sabal in 2009 which showed 30% LAD and RCA stenoses with a normal nondominant circumflex and with continued normal LV function.  Since I had seen her, she tells me she developed diabetes mellitus over the last 3 years.  She is on medication for hypertension as well as GERD, hyperlipidemia, asthma and peripheral neuropathy.  She also was diagnosed with sleep apnea and is using CPAP therapy.  In size saw her, she has gotten divorced.  He suddenly has developed episodes of chest discomfort.  Oftentimes these are sharp and short-lived.  However, other times she does note some difficulty swallowing.  She has experienced episodes of increasing shortness of breath with activity and also associated with right arm discomfort.  She presents now for cardiology evaluation.  With reference to her sleep apnea, an Epworth Sleepiness Scale score was calculated today and this  endorsed at 13 suggestive of excessive daytime sleepiness.  Past Medical History  Diagnosis Date  . Diabetes mellitus   . Hypertension   . Hypokalemia   . Diverticulitis   . Hypercholesteremia   . Vertigo     patient reported    Past Surgical History  Procedure Laterality Date  . Cholecystectomy    . Abdominal hysterectomy    . Appendectomy    . Wisdom tooth extraction      Allergies  Allergen Reactions  . Aspirin Other (See Comments)    Bruises easily  . Latex Itching and Rash    Current Outpatient Prescriptions  Medication Sig Dispense Refill  . ACCU-CHEK AVIVA PLUS test strip   5  . ACCU-CHEK SOFTCLIX LANCETS lancets   5  . CRESTOR 20 MG tablet Take 20 mg by mouth at bedtime.  1  . Cyanocobalamin (B-12) 5000 MCG CAPS Take 1 capsule by mouth daily.    . cyclobenzaprine (FLEXERIL) 5 MG tablet Take 5 mg by mouth daily.    . diclofenac (VOLTAREN) 75 MG EC tablet Take 75 mg by mouth 2 (two) times daily.    Marland Kitchen gabapentin (NEURONTIN) 300 MG capsule Take 2 capsules by mouth 3 (three) times daily.  6  . glimepiride (AMARYL) 4 MG tablet Take 4 mg by mouth daily with breakfast.    . HYDROcodone-ibuprofen (VICOPROFEN) 7.5-200 MG per tablet Take 1 tablet by mouth every 8 (eight) hours as needed for moderate pain.    Marland Kitchen losartan (COZAAR) 50 MG tablet Take 50 mg by mouth every  morning.    Marland Kitchen. losartan-hydrochlorothiazide (HYZAAR) 100-25 MG per tablet Take 1 tablet by mouth daily.    . polyethylene glycol powder (GLYCOLAX/MIRALAX) powder Take 17 g by mouth 2 (two) times daily as needed for mild constipation. Until daily soft stools  OTC    . tiZANidine (ZANAFLEX) 2 MG tablet Take 2-4 mg by mouth 3 (three) times daily as needed for muscle spasms (pain).     . Vitamin D, Ergocalciferol, (DRISDOL) 50000 UNITS CAPS capsule Take 1 capsule by mouth once a week.    Marland Kitchen. amitriptyline (ELAVIL) 10 MG tablet Take 10 mg by mouth daily.    Marland Kitchen. amLODipine (NORVASC) 5 MG tablet Take 1 tablet (5 mg total)  by mouth daily. 90 tablet 3  . aspirin EC 81 MG tablet Take 81 mg by mouth every morning.     . estrogens, conjugated, (PREMARIN) 0.3 MG tablet Take 0.3 mg by mouth daily.    Marland Kitchen. lactobacillus acidophilus (BACID) TABS tablet Take 1 tablet by mouth every morning.     Marland Kitchen. omeprazole (PRILOSEC) 20 MG capsule Take 1 capsule (20 mg total) by mouth daily. (Patient not taking: Reported on 07/08/2014) 30 capsule 0   No current facility-administered medications for this visit.    Socially she is divorced.  She quit tobacco use in 1986.  She does drink occasional alcohol.  She does not routinely exercise.  Family History  Problem Relation Age of Onset  . Diabetes Mother   . Gout Mother   . Hypertension Mother   . Arthritis Mother   . Heart disease Father   . Heart failure Sister   . Arthritis Sister   . Heart disease Maternal Grandmother   . Diabetes Maternal Grandmother   . Heart disease Paternal Grandmother   . Diabetes Paternal Grandmother   . Diabetes Sister   . Bursitis Sister   . Crohn's disease Sister    Family history is notable that her mother is alive at 3787 and has diabetes mellitus, gout, and hypertension.  Father died at age 58 with heart failure and had a pacemaker.  One brother also has a pacemaker.  One sister has congestive heart failure.  Another sister has diabetes mellitus.  Another sister has Crohn's disease.  ROS General: Negative; No fevers, chills, or night sweats HEENT: Negative; No changes in vision or hearing, sinus congestion, difficulty swallowing Pulmonary: Negative; No cough, wheezing, shortness of breath, hemoptysis Cardiovascular:  See HPI; No chest pain, presyncope, syncope, palpitations, edema GI: Positive for GERD on omeprazole; No nausea, vomiting, diarrhea, or abdominal pain GU: Negative; No dysuria, hematuria, or difficulty voiding Musculoskeletal: Negative; no myalgias, joint pain, or weakness Hematologic/Oncologic: Negative; no easy bruising,  bleeding Endocrine: Negative; no heat/cold intolerance; no diabetes Neuro: Positive for peripheral neuropathy on gabapentin Skin: Negative; No rashes or skin lesions Psychiatric: Negative; No behavioral problems, depression Sleep: Positive for obstructive sleep apnea on CPAP therapy and  daytime sleepiness, hypersomnolence; no bruxism, restless legs, hypnogagnic hallucinations Other comprehensive 14 point system review is negative   Physical Exam BP 130/86 mmHg  Pulse 77  Ht 5\' 6"  (1.676 m)  Wt 186 lb (84.369 kg)  BMI 30.04 kg/m2 General: Alert, oriented, no distress.  Skin: normal turgor, no rashes, warm and dry HEENT: Normocephalic, atraumatic. Pupils equal round and reactive to light; sclera anicteric; extraocular muscles intact; Fundi no hemorrhages or exudates.  Sharp discs. Nose without nasal septal hypertrophy Mouth/Parynx benign; Mallinpatti scale 3 Neck: No JVD, no carotid bruits; normal carotid upstroke  Lungs: clear to ausculatation and percussion; no wheezing or rales Chest wall: Definite tenderness to palpation over the left costochondral region. Heart: PMI not displaced, RRR, s1 s2 normal, 1/6 systolic murmur, no diastolic murmur, no rubs, gallops, thrills, or heaves Abdomen: soft, nontender; no hepatosplenomehaly, BS+; abdominal aorta nontender and not dilated by palpation. Back: no CVA tenderness Pulses 2+ Musculoskeletal: full range of motion, normal strength, no joint deformities Extremities: no clubbing cyanosis or edema, Homan's sign negative  Neurologic: grossly nonfocal; Cranial nerves grossly wnl Psychologic: Normal mood and affect   ECG (independently read by me): Normal sinus rhythm at 77 bpm.  QRS complex V1 V2; nondiagnostic T changes anterolaterally.  QTc interval 454 ms  LABS:  BMET    Component Value Date/Time   NA 142 12/26/2013 1339   K 3.2* 12/26/2013 1339   CL 102 12/26/2013 1339   CO2 29 12/26/2013 1339   GLUCOSE 128* 12/26/2013 1339    BUN 9 12/26/2013 1339   CREATININE 0.75 12/26/2013 1339   CALCIUM 9.7 12/26/2013 1339   GFRNONAA >90 12/26/2013 1339   GFRAA >90 12/26/2013 1339     Hepatic Function Panel     Component Value Date/Time   PROT 8.1 12/22/2013 2155   ALBUMIN 3.8 12/22/2013 2155   AST 15 12/22/2013 2155   ALT 17 12/22/2013 2155   ALKPHOS 108 12/22/2013 2155   BILITOT 0.3 12/22/2013 2155     CBC    Component Value Date/Time   WBC 4.0 12/26/2013 1339   RBC 4.52 12/26/2013 1339   HGB 12.9 12/26/2013 1339   HCT 38.8 12/26/2013 1339   PLT 209 12/26/2013 1339   MCV 85.8 12/26/2013 1339   MCH 28.5 12/26/2013 1339   MCHC 33.2 12/26/2013 1339   RDW 13.5 12/26/2013 1339   LYMPHSABS 2.1 12/22/2013 2155   MONOABS 0.5 12/22/2013 2155   EOSABS 0.2 12/22/2013 2155   BASOSABS 0.0 12/22/2013 2155     BNP No results found for: PROBNP  Lipid Panel  No results found for: CHOL, TRIG, HDL, CHOLHDL, VLDL, LDLCALC, LDLDIRECT    RADIOLOGY: No results found.   ASSESSMENT AND PLAN: [name] is a 58 year old African-American female who has previously been documented have mild CAD at initial catheterization in 2004 and subsequent cardiac catheterization in 2009.  Since I have last saw her almost 10 years ago, she has developed diabetes mellitus, hypertension, obstructive sleep apnea and peripheral neuropathy.  Currently she is on losartan HCT 100/25 for blood pressure control.  Her blood pressure is mildly elevated and on repeat by me was 142/88.  With her diabetes mellitus, target blood pressure should be less than 130/80.  I am electing to add amlodipine 5 mg to her medical regimen to be helpful both for blood pressure control and potential esophageal spasm or ischemic chest pain.  I am recommending complete set of laboratory be obtained on her current medical regimen and ultimately she may require additional therapy for optimal blood pressure control.  She does have chest pain and some of this may be due  to costochondral tenderness.  She has been taking Aleve for this, but also chronically has been taking Voltaren 75 mg twice a day.  I have recommended she discontinue the Aleve since she already is taking Voltaren.  I am scheduling her for an echo Doppler study to assess systolic and diastolic function.  In addition to valvular architecture.  In light of her diabetes mellitus, exertional shortness of breath with right arm discomfort.  I will also schedule her for a Lexiscan Myoview study to assess for potential ischemia.  Presently, she is minimally obese with a body mass index of 30.04, and weight loss is recommended.  I will contact her regarding her laboratory and adjustments will be made to her medications if necessary.  She may ultimately need a download of her CPAP unit, particularly with residual daytime sleepiness on her current CPAP.  I'm not certain who is monitoring her CPAP therapy, and I will not schedule this.  Presently.  I will see her back in the office in 6 weeks for follow up evaluation.  Further recommendations will be made at that time.  Time spent: 45 minutes   Lennette Bihari, MD, [facility] 07/08/2014 5:30 PM

## 2014-07-29 DIAGNOSIS — G4710 Hypersomnia, unspecified: Secondary | ICD-10-CM | POA: Diagnosis not present

## 2014-07-29 DIAGNOSIS — R5383 Other fatigue: Secondary | ICD-10-CM | POA: Diagnosis not present

## 2014-07-29 DIAGNOSIS — G4733 Obstructive sleep apnea (adult) (pediatric): Secondary | ICD-10-CM | POA: Diagnosis not present

## 2014-08-05 DIAGNOSIS — I10 Essential (primary) hypertension: Secondary | ICD-10-CM | POA: Diagnosis not present

## 2014-08-05 DIAGNOSIS — E785 Hyperlipidemia, unspecified: Secondary | ICD-10-CM | POA: Diagnosis not present

## 2014-08-05 DIAGNOSIS — E119 Type 2 diabetes mellitus without complications: Secondary | ICD-10-CM | POA: Diagnosis not present

## 2014-08-05 DIAGNOSIS — E538 Deficiency of other specified B group vitamins: Secondary | ICD-10-CM | POA: Diagnosis not present

## 2014-08-05 DIAGNOSIS — F329 Major depressive disorder, single episode, unspecified: Secondary | ICD-10-CM | POA: Diagnosis not present

## 2014-08-05 DIAGNOSIS — B350 Tinea barbae and tinea capitis: Secondary | ICD-10-CM | POA: Diagnosis not present

## 2014-08-05 DIAGNOSIS — K219 Gastro-esophageal reflux disease without esophagitis: Secondary | ICD-10-CM | POA: Diagnosis not present

## 2014-08-05 DIAGNOSIS — M79609 Pain in unspecified limb: Secondary | ICD-10-CM | POA: Diagnosis not present

## 2014-08-06 ENCOUNTER — Telehealth (HOSPITAL_COMMUNITY): Payer: Self-pay

## 2014-08-06 IMAGING — CR DG CHEST 2V
2 series · 2 of 2 positions shown · non-contrast
Comparison: Chest radiograph 12/12/2013

CLINICAL DATA: Chest pain and hypertension

EXAM:
CHEST  2 VIEW

[w chest pa]
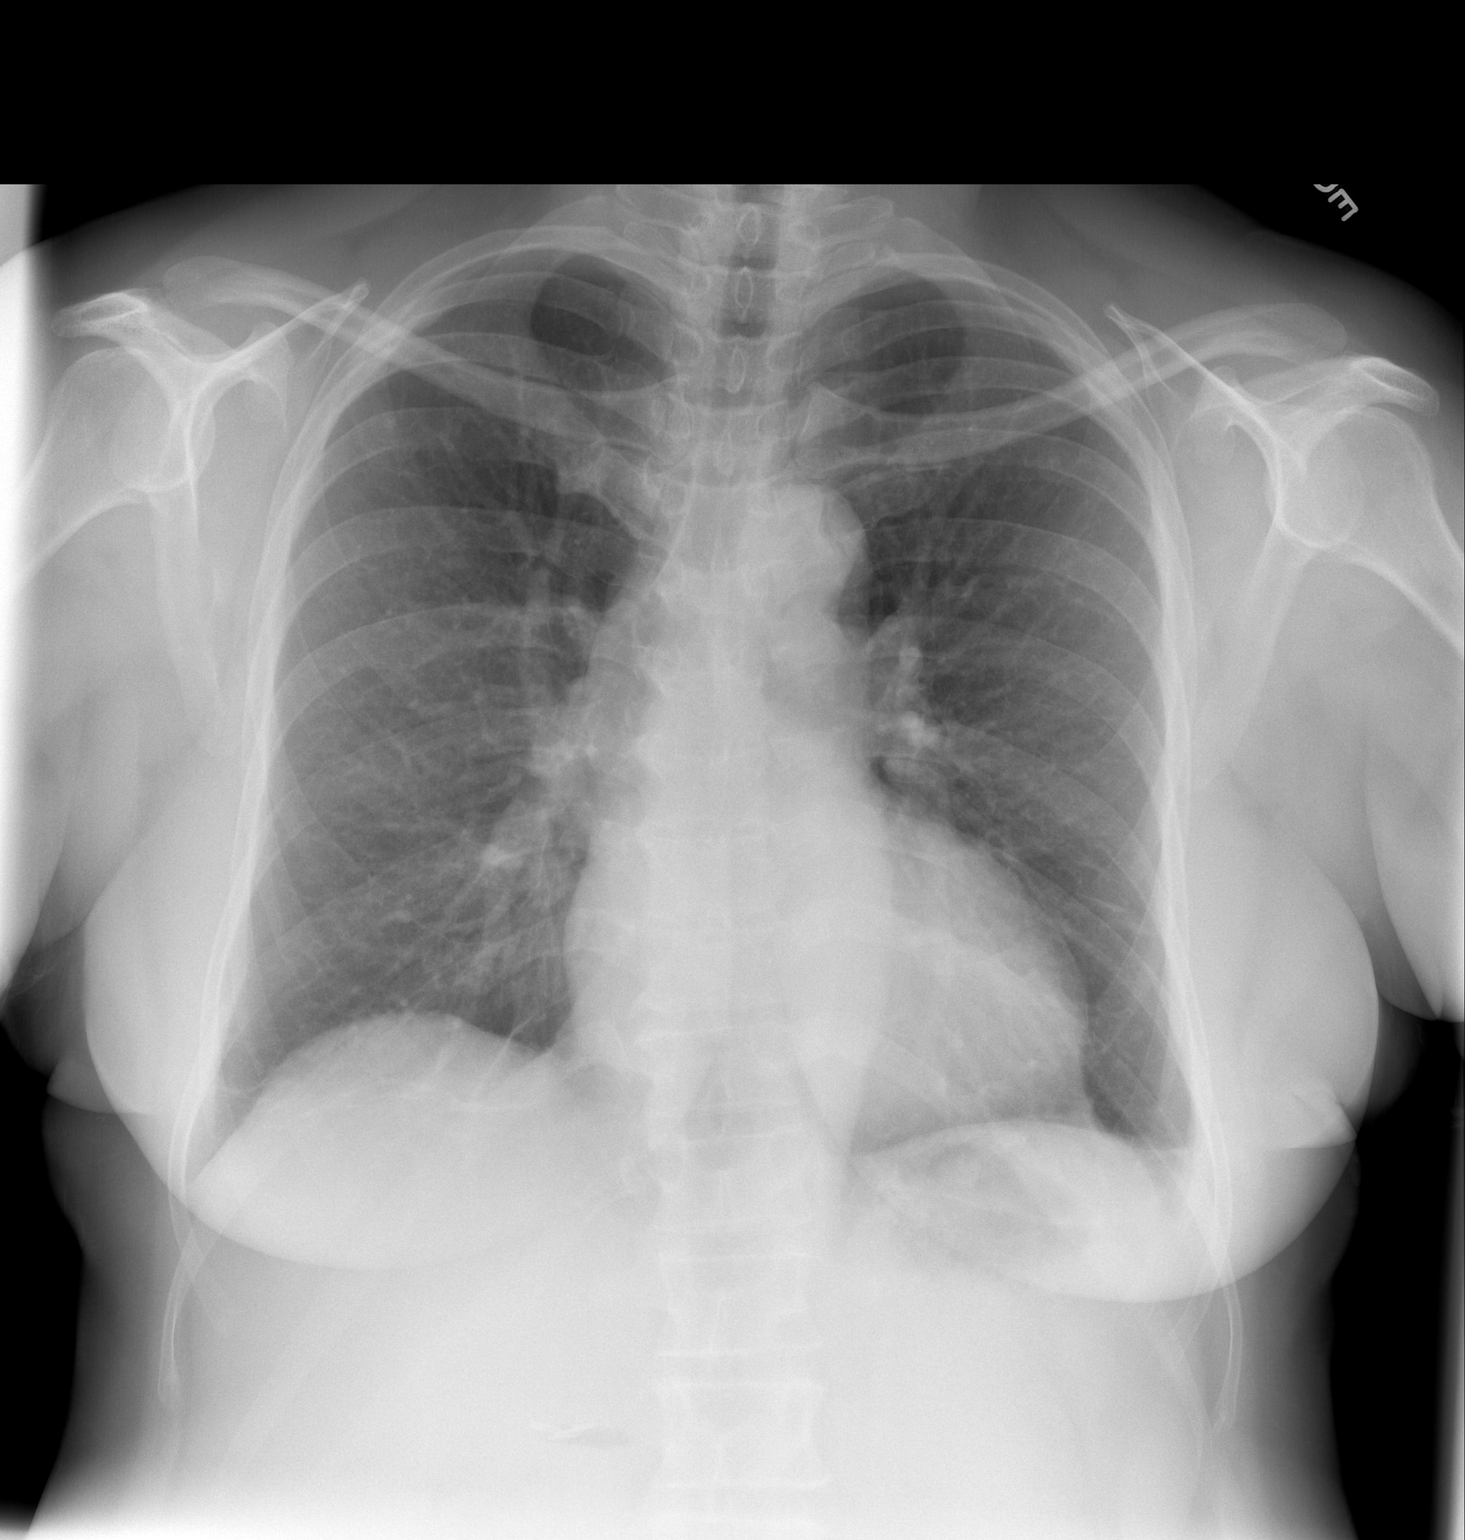

[w chest lat]
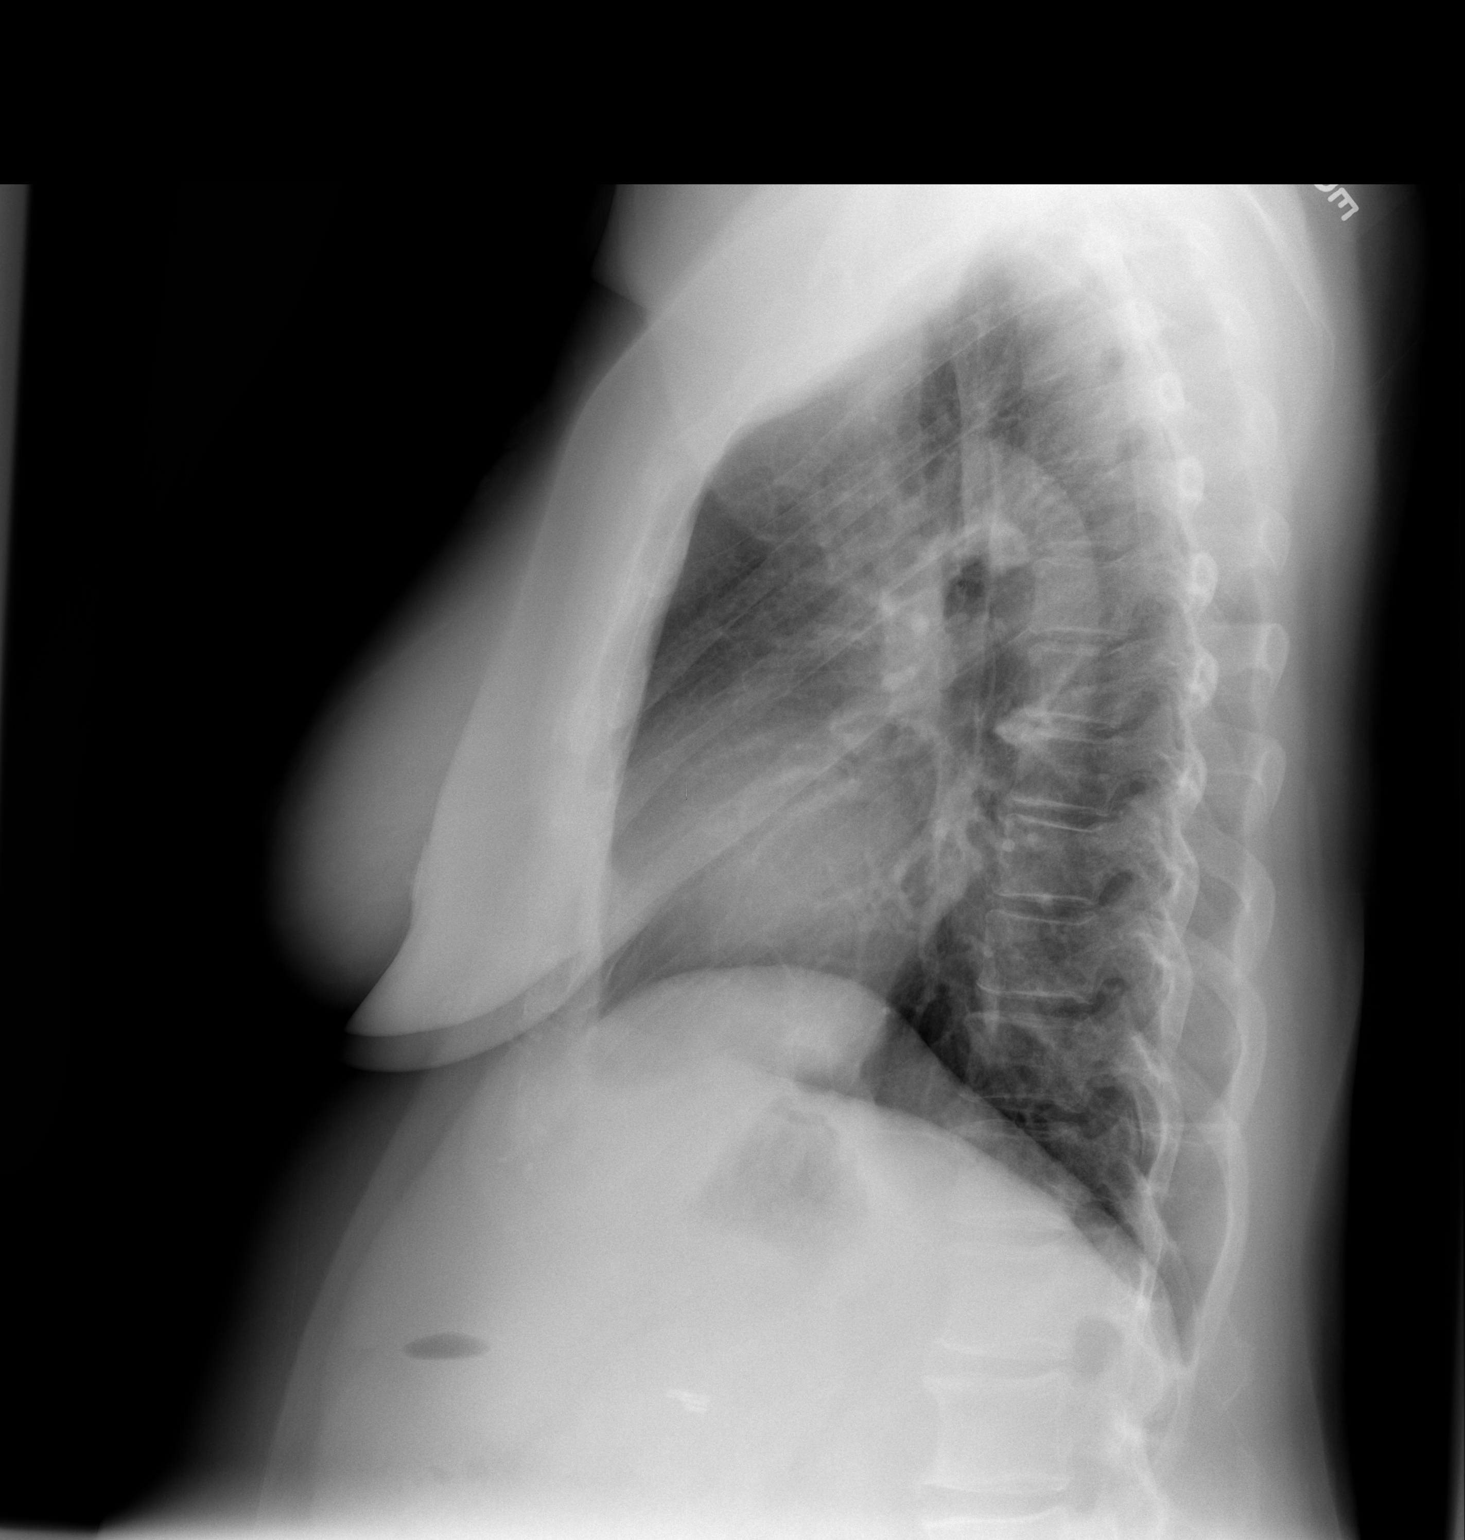

[2 of 2 positions shown; findings below may reference images not displayed]

FINDINGS: Mild enlargement of the cardiac silhouette is stable. The thoracic
aorta and hilar contours are stable. Pulmonary vascularity is
normal. The trachea is midline. The lungs are normal in volume.
There is minimal atelectasis at both lung bases. Negative for
airspace disease, effusion, or pneumothorax. No acute or suspicious
bony abnormality. Cholecystectomy clips present.
IMPRESSION: Stable mild cardiomegaly. Other than minimal bibasilar atelectasis,
no acute findings identified.

## 2014-08-06 NOTE — Telephone Encounter (Signed)
Encounter complete. 

## 2014-08-12 ENCOUNTER — Ambulatory Visit (HOSPITAL_BASED_OUTPATIENT_CLINIC_OR_DEPARTMENT_OTHER)
Admission: RE | Admit: 2014-08-12 | Discharge: 2014-08-12 | Disposition: A | Discharge status: Home / Self Care | Payer: Medicare Other | Source: Ambulatory Visit | Attending: Cardiovascular Disease | Admitting: Cardiovascular Disease

## 2014-08-12 ENCOUNTER — Ambulatory Visit (HOSPITAL_COMMUNITY)
Admission: RE | Admit: 2014-08-12 | Discharge: 2014-08-12 | Disposition: A | Discharge status: Home / Self Care | Payer: Medicare Other | Source: Ambulatory Visit | Attending: Internal Medicine | Admitting: Internal Medicine

## 2014-08-12 DIAGNOSIS — R002 Palpitations: Secondary | ICD-10-CM | POA: Diagnosis not present

## 2014-08-12 DIAGNOSIS — R079 Chest pain, unspecified: Secondary | ICD-10-CM | POA: Diagnosis not present

## 2014-08-12 DIAGNOSIS — E669 Obesity, unspecified: Secondary | ICD-10-CM | POA: Diagnosis not present

## 2014-08-12 DIAGNOSIS — I10 Essential (primary) hypertension: Secondary | ICD-10-CM

## 2014-08-12 DIAGNOSIS — Z87891 Personal history of nicotine dependence: Secondary | ICD-10-CM | POA: Insufficient documentation

## 2014-08-12 DIAGNOSIS — R0602 Shortness of breath: Secondary | ICD-10-CM | POA: Diagnosis not present

## 2014-08-12 DIAGNOSIS — Z8249 Family history of ischemic heart disease and other diseases of the circulatory system: Secondary | ICD-10-CM | POA: Diagnosis not present

## 2014-08-12 DIAGNOSIS — R0789 Other chest pain: Secondary | ICD-10-CM

## 2014-08-12 DIAGNOSIS — E785 Hyperlipidemia, unspecified: Secondary | ICD-10-CM | POA: Insufficient documentation

## 2014-08-12 DIAGNOSIS — I369 Nonrheumatic tricuspid valve disorder, unspecified: Secondary | ICD-10-CM

## 2014-08-12 DIAGNOSIS — R42 Dizziness and giddiness: Secondary | ICD-10-CM | POA: Insufficient documentation

## 2014-08-12 DIAGNOSIS — R5383 Other fatigue: Secondary | ICD-10-CM | POA: Insufficient documentation

## 2014-08-12 DIAGNOSIS — R61 Generalized hyperhidrosis: Secondary | ICD-10-CM | POA: Diagnosis not present

## 2014-08-12 MED ORDER — REGADENOSON 0.4 MG/5ML IV SOLN
0.4000 mg | Freq: Once | INTRAVENOUS | Status: AC
  Administered 2014-08-12: 0.4 mg via INTRAVENOUS

## 2014-08-12 MED ORDER — TECHNETIUM TC 99M SESTAMIBI GENERIC - CARDIOLITE
10.0000 | Freq: Once | INTRAVENOUS | Status: AC | PRN
  Administered 2014-08-12: 10 via INTRAVENOUS

## 2014-08-12 MED ORDER — TECHNETIUM TC 99M SESTAMIBI GENERIC - CARDIOLITE
30.0000 | Freq: Once | INTRAVENOUS | Status: AC | PRN
  Administered 2014-08-12: 30 via INTRAVENOUS

## 2014-08-12 NOTE — Progress Notes (Signed)
2D Echo Performed 08/12/2014    [name], RCS  

## 2014-08-12 NOTE — Procedures (Addendum)
[facility] [address] [place] [id number] [phone]  Cardiology Nuclear Med Study  Gordon Bonnie Bonnie Gordon is a 59 y.o. female     MRN : [id number]     DOB: 11-04-1955  Procedure Date: 08/12/2014  Nuclear Med Background Indication for Stress Test:  Evaluation for Ischemia and Follow up CAD History:  Asthma and CAD;Last NUC MPI on 01/06/2003; Cardiac Risk Factors: Family History - CAD, History of Smoking, Hypertension, Lipids and Obesity  Symptoms:  Chest Pain, Diaphoresis, Dizziness, DOE, Fatigue, Light-Headedness, Palpitations and SOB   Nuclear Pre-Procedure Caffeine/Decaff Intake:  8:00pm NPO After: 6:00am   IV Site: R Forearm  IV 0.9% NS with Angio Cath:  22g  Chest Size (in):  n/a IV Started by: Berdie OgrenAmanda Wease, RN  Height: 5\' 6"  (1.676 m)  Cup Size: C  BMI:  Body mass index is 30.04 kg/(m^2). Weight:  186 lb (84.369 kg)   Tech Comments:  n/a    Nuclear Med Study 1 or 2 day study: 1 day  Stress Test Type:  Lexiscan  Order Authorizing Provider:  Nicki Guadalajarahomas Kelly, MD   Resting Radionuclide: Technetium 2074m Sestamibi  Resting Radionuclide Dose: 10.8 mCi   Stress Radionuclide:  Technetium 4174m Sestamibi  Stress Radionuclide Dose: 31.4 mCi           Stress Protocol Rest HR:68 Stress HR: 88  Rest BP: 131/97 Stress BP: 135/80  Exercise Time (min): n/a METS: n/a          Dose of Adenosine (mg):  n/a Dose of Lexiscan: 0.4 mg  Dose of Atropine (mg): n/a Dose of Dobutamine: n/a mcg/kg/min (at max HR)  Stress Test Technologist: Ernestene MentionGwen Farrington, CCT Nuclear Technologist: Koren Shiverobin Moffitt, CNMT   Rest Procedure:  Myocardial perfusion imaging was performed at rest 45 minutes following the intravenous administration of Technetium 1174m Sestamibi. Stress Procedure:  The patient received IV Lexiscan 0.4 mg over 15-seconds.  Technetium 7774m Sestamibi injected IV at 30-seconds.  There were no significant changes with Lexiscan.  Quantitative spect  images were obtained after a 45 minute delay.  Transient Ischemic Dilatation (Normal <1.22):  0.94 QGS EDV:  76 ml QGS ESV:  32 ml LV Ejection Fraction: 58%        Rest ECG: NSR - Normal EKG  Stress ECG: No significant change from baseline ECG  QPS Raw Data Images:  Normal; no motion artifact; normal heart/lung ratio. Stress Images:  Normal homogeneous uptake in all areas of the myocardium. Rest Images:  Normal homogeneous uptake in all areas of the myocardium. Subtraction (SDS):  No evidence of ischemia.  Impression Exercise Capacity:  Lexiscan with no exercise. BP Response:  Normal blood pressure response. Clinical Symptoms:  No significant symptoms noted. ECG Impression:  No significant ST segment change suggestive of ischemia. Comparison with Prior Nuclear Study: No images to compare  Overall Impression:  Normal stress nuclear study.  LV Wall Motion:  NL LV Function; NL Wall Motion   Runell Bonnie Gordon,Bonnie Tesfaye J, MD  08/12/2014 12:40 PM

## 2014-08-25 ENCOUNTER — Encounter: Payer: Self-pay | Admitting: *Deleted

## 2014-08-26 DIAGNOSIS — I10 Essential (primary) hypertension: Secondary | ICD-10-CM | POA: Diagnosis not present

## 2014-08-26 DIAGNOSIS — K219 Gastro-esophageal reflux disease without esophagitis: Secondary | ICD-10-CM | POA: Diagnosis not present

## 2014-08-26 DIAGNOSIS — E119 Type 2 diabetes mellitus without complications: Secondary | ICD-10-CM | POA: Diagnosis not present

## 2014-08-26 DIAGNOSIS — E782 Mixed hyperlipidemia: Secondary | ICD-10-CM | POA: Diagnosis not present

## 2014-08-26 DIAGNOSIS — E876 Hypokalemia: Secondary | ICD-10-CM | POA: Diagnosis not present

## 2014-08-26 DIAGNOSIS — E538 Deficiency of other specified B group vitamins: Secondary | ICD-10-CM | POA: Diagnosis not present

## 2014-08-26 DIAGNOSIS — R0789 Other chest pain: Secondary | ICD-10-CM | POA: Diagnosis not present

## 2014-08-26 DIAGNOSIS — Z79899 Other long term (current) drug therapy: Secondary | ICD-10-CM | POA: Diagnosis not present

## 2014-08-26 DIAGNOSIS — F329 Major depressive disorder, single episode, unspecified: Secondary | ICD-10-CM | POA: Diagnosis not present

## 2014-08-26 DIAGNOSIS — H539 Unspecified visual disturbance: Secondary | ICD-10-CM | POA: Diagnosis not present

## 2014-08-26 DIAGNOSIS — E785 Hyperlipidemia, unspecified: Secondary | ICD-10-CM | POA: Diagnosis not present

## 2014-08-26 LAB — COMPREHENSIVE METABOLIC PANEL
ALT: 23 U/L (ref 0–35)
AST: 17 U/L (ref 0–37)
Albumin: 4.2 g/dL (ref 3.5–5.2)
Alkaline Phosphatase: 136 U/L — ABNORMAL HIGH (ref 39–117)
BUN: 7 mg/dL (ref 6–23)
CO2: 29 mEq/L (ref 19–32)
Calcium: 9.2 mg/dL (ref 8.4–10.5)
Chloride: 105 mEq/L (ref 96–112)
Creat: 0.68 mg/dL (ref 0.50–1.10)
Glucose, Bld: 115 mg/dL — ABNORMAL HIGH (ref 70–99)
Potassium: 3.8 mEq/L (ref 3.5–5.3)
Sodium: 141 mEq/L (ref 135–145)
Total Bilirubin: 0.3 mg/dL (ref 0.2–1.2)
Total Protein: 7.4 g/dL (ref 6.0–8.3)

## 2014-08-26 LAB — CBC
HCT: 41.3 % (ref 36.0–46.0)
Hemoglobin: 13.9 g/dL (ref 12.0–15.0)
MCH: 28 pg (ref 26.0–34.0)
MCHC: 33.7 g/dL (ref 30.0–36.0)
MCV: 83.3 fL (ref 78.0–100.0)
MPV: 11 fL (ref 9.4–12.4)
Platelets: 197 10*3/uL (ref 150–400)
RBC: 4.96 MIL/uL (ref 3.87–5.11)
RDW: 13.6 % (ref 11.5–15.5)
WBC: 4.7 10*3/uL (ref 4.0–10.5)

## 2014-08-26 LAB — LIPID PANEL
Cholesterol: 151 mg/dL (ref 0–200)
HDL: 52 mg/dL (ref >39)
LDL Cholesterol: 80 mg/dL (ref 0–99)
Total CHOL/HDL Ratio: 2.9 Ratio
Triglycerides: 97 mg/dL (ref <150)
VLDL: 19 mg/dL (ref 0–40)

## 2014-08-26 LAB — TSH: TSH: 1.066 u[IU]/mL (ref 0.350–4.500)

## 2014-08-26 LAB — HEMOGLOBIN A1C
Hgb A1c MFr Bld: 7 % — ABNORMAL HIGH (ref <5.7)
Mean Plasma Glucose: 154 mg/dL — ABNORMAL HIGH (ref <117)

## 2014-09-02 ENCOUNTER — Ambulatory Visit (INDEPENDENT_AMBULATORY_CARE_PROVIDER_SITE_OTHER): Payer: Medicare Other | Admitting: Cardiovascular Disease

## 2014-09-02 ENCOUNTER — Encounter: Payer: Self-pay | Admitting: Cardiovascular Disease

## 2014-09-02 VITALS — BP 131/88 | HR 81 | Ht 66.0 in | Wt 182.6 lb

## 2014-09-02 DIAGNOSIS — G4733 Obstructive sleep apnea (adult) (pediatric): Secondary | ICD-10-CM | POA: Diagnosis not present

## 2014-09-02 DIAGNOSIS — I10 Essential (primary) hypertension: Secondary | ICD-10-CM | POA: Diagnosis not present

## 2014-09-02 DIAGNOSIS — Z9989 Dependence on other enabling machines and devices: Secondary | ICD-10-CM

## 2014-09-02 DIAGNOSIS — E785 Hyperlipidemia, unspecified: Secondary | ICD-10-CM | POA: Diagnosis not present

## 2014-09-02 DIAGNOSIS — R079 Chest pain, unspecified: Secondary | ICD-10-CM | POA: Diagnosis not present

## 2014-09-02 DIAGNOSIS — E1140 Type 2 diabetes mellitus with diabetic neuropathy, unspecified: Secondary | ICD-10-CM | POA: Diagnosis not present

## 2014-09-02 DIAGNOSIS — E1142 Type 2 diabetes mellitus with diabetic polyneuropathy: Secondary | ICD-10-CM

## 2014-09-02 NOTE — Progress Notes (Signed)
Patient ID: Bonnie ShieldsDaryll Gordon, female   DOB: 11-10-1955, 59 y.o.   MRN: [id number]   Primary:  Dr. Dennison Bonnie Gordon   HPI: Bonnie Gordon is a 59 y.o. female who presents for a follow-up evaluation following recently reestablishing cardiology care with me 7 weeks ago.  I had seen Bonnie Gordon initially in 2004 after she had experienced chest pressure and [facility]  A nuclear perfusion study raised the possibility of anterior to inferolateral wall ischemia.  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  She was treated aggressively with statin therapy in attempt to induce plaque or aggression.  I have not seen her since 2006.  Apparently, she has undergone a second heart catheterization which was done by Dr. Allyson Gordon in 2009 which showed 30% LAD and RCA stenoses with a normal nondominant circumflex and with continued normal LV function.  She developed diabetes mellitus over the last 3 years.  She is on medication for hypertension as well as GERD, hyperlipidemia, asthma and peripheral neuropathy.  She also was diagnosed with sleep apnea and is using CPAP therapy.  Recently, she had developed episodes of chest discomfort.  Oftentimes these are sharp and short-lived.  However, other times she does note some difficulty swallowing.  She has experienced episodes of increasing shortness of breath with activity and also associated with right arm discomfort.  Since I saw her, she underwent a 2-D echo Doppler study on 08/12/2014 which showed normal systolic function without regional wall motion abnormalities.  We'll valvular architecture.  A nuclear perfusion study revealed an ejection fraction of 58% with normal perfusion and function.  There were no ECG changes on the study.  She underwent laboratory which  demonstrated a hemoglobin of 13.9, hematocrit 41.3.  Electrolytes were normal, although glucose was mildly elevated at 1:15.  She had normal liver function studies with the exception of minimal alkaline phosphatase elevation at 136.  Her lipids were excellent with a total cholesterol 151, triglycerides 97, HDL 52, LDL 80.  Hemoglobin A1c was increased at 7.0.  She presents now for follow-up evaluation.  With reference to her sleep apnea, an Epworth Sleepiness Scale score was calculated at her recent office visit which endorsed at 13 suggestive of excessive daytime sleepiness.  Past Medical History  Diagnosis Date  . Diabetes mellitus   . Hypertension   . Hypokalemia   . Diverticulitis   . Hypercholesteremia   . Vertigo     patient reported  . Chest pain 2009    echo    Past Surgical History  Procedure Laterality Date  . Cholecystectomy    . Abdominal hysterectomy    . Appendectomy    . Wisdom tooth extraction    . Cardiac catheterization  2009    normal cath  . Cardiac catheterization  2004    normal cath    Allergies  Allergen Reactions  . Asa [Aspirin] Other (See Comments)    Bruises easily  . Latex Itching and Rash    Current Outpatient Prescriptions  Medication Sig Dispense Refill  . ACCU-CHEK AVIVA PLUS test strip   5  . ACCU-CHEK SOFTCLIX LANCETS lancets   5  . amLODipine (NORVASC) 5 MG tablet Take 1 tablet (5 mg total) by mouth daily. 90 tablet 3  . CRESTOR 20 MG tablet Take 20 mg by mouth at bedtime.  1  . cyclobenzaprine (FLEXERIL) 5 MG tablet Take 5 mg by mouth daily.    . furosemide (LASIX) 20 MG tablet Take 1 tablet by mouth daily.    Marland Kitchen gabapentin (NEURONTIN) 300 MG capsule Take 2 capsules by mouth 3 (three) times daily.  6  . glimepiride (AMARYL) 4 MG tablet Take 4 mg by mouth daily with breakfast.    . HYDROcodone-acetaminophen (NORCO/VICODIN) 5-325 MG per tablet Take 1 tablet by mouth every 6 (six) hours as needed.  0  . ketoconazole (NIZORAL) 2 % shampoo  Apply 1 application topically 2 (two) times a week.  0   No current facility-administered medications for this visit.    Socially she is divorced.  She quit tobacco use in 1986.  She does drink occasional alcohol.  She does not routinely exercise.  Family History  Problem Relation Age of Onset  . Diabetes Mother   . Gout Mother   . Hypertension Mother   . Arthritis Mother   . Heart disease Father   . Heart failure Sister   . Arthritis Sister   . Heart disease Maternal Grandmother   . Diabetes Maternal Grandmother   . Heart disease Paternal Grandmother   . Diabetes Paternal Grandmother   . Diabetes Sister   . Bursitis Sister   . Crohn's disease Sister    Family history is notable that her mother is alive at 88 and has diabetes mellitus, gout, and hypertension.  Father died at age 61 with heart failure and had a pacemaker.  One brother also has a pacemaker.  One sister has congestive heart failure.  Another sister has diabetes mellitus.  Another sister has Crohn's disease.  ROS General: Negative; No fevers, chills, or night sweats HEENT: Negative; No changes in vision or hearing, sinus congestion, difficulty swallowing Pulmonary: Negative; No cough, wheezing, shortness of breath, hemoptysis Cardiovascular:  See HPI; No chest pain, presyncope, syncope, palpitations, edema GI: Positive for GERD on omeprazole; No nausea, vomiting, diarrhea, or abdominal pain GU: Negative; No dysuria, hematuria, or difficulty voiding Musculoskeletal: Negative; no myalgias, joint pain, or weakness Hematologic/Oncologic: Negative; no easy bruising, bleeding Endocrine: Negative; no heat/cold intolerance; no diabetes Neuro: Positive for peripheral neuropathy on gabapentin Skin: Negative; No rashes or skin lesions Psychiatric: Negative; No behavioral problems, depression Sleep: Positive for obstructive sleep apnea on CPAP therapy and  daytime sleepiness, hypersomnolence; no bruxism, restless legs,  hypnogagnic hallucinations Other comprehensive 14 point system review is negative   Physical Exam BP 131/88 mmHg  Pulse 81  Ht  (1.676 m)  Wt 182 lb 9.6 oz (82.827 kg)  BMI 29.49 kg/m2 General: Alert, oriented, no distress.  Skin: normal turgor, no rashes, warm and dry HEENT: Normocephalic, atraumatic. Pupils equal round and reactive to light; sclera anicteric; extraocular muscles intact; Fundi no hemorrhages or exudates.  Sharp discs. Nose without nasal septal hypertrophy Mouth/Parynx benign; Mallinpatti scale 3 Neck: No JVD, no carotid bruits; normal carotid upstroke Lungs: clear to ausculatation and percussion; no wheezing or rales Chest wall: Definite tenderness to palpation over the left costochondral region. Heart: PMI not displaced, RRR, s1 s2 normal, 1/6 systolic murmur, no diastolic murmur, no rubs, gallops, thrills, or heaves Abdomen: soft, nontender; no hepatosplenomehaly, BS+; abdominal aorta nontender and not dilated by palpation. Back: no CVA tenderness Pulses 2+ Musculoskeletal: full range of motion, normal strength, no joint deformities Extremities: no clubbing cyanosis or edema, Homan's sign negative  Neurologic: grossly nonfocal; Cranial nerves grossly wnl Psychologic: Normal mood and affect  ECG (independently read by me): Normal sinus rhythm at 77 bpm.  QRS complex V1 V2; nondiagnostic T changes anterolaterally.  QTc interval 454 ms  LABS:  BMET    Component Value Date/Time   NA 141 08/26/2014 0907   K 3.8 08/26/2014 0907   CL 105 08/26/2014 0907   CO2 29 08/26/2014 0907   GLUCOSE 115* 08/26/2014 0907   BUN 7 08/26/2014 0907   CREATININE 0.68 08/26/2014 0907   CREATININE 0.75 12/26/2013 1339   CALCIUM 9.2 08/26/2014 0907   GFRNONAA >90 12/26/2013 1339   GFRAA >90 12/26/2013 1339     Hepatic Function Panel     Component Value Date/Time   PROT 7.4 08/26/2014 0907   ALBUMIN 4.2 08/26/2014 0907   AST 17 08/26/2014 0907   ALT 23 08/26/2014  0907   ALKPHOS 136* 08/26/2014 0907   BILITOT 0.3 08/26/2014 0907     CBC    Component Value Date/Time   WBC 4.7 08/26/2014 0907   RBC 4.96 08/26/2014 0907   HGB 13.9 08/26/2014 0907   HCT 41.3 08/26/2014 0907   PLT 197 08/26/2014 0907   MCV 83.3 08/26/2014 0907   MCH 28.0 08/26/2014 0907   MCHC 33.7 08/26/2014 0907   RDW 13.6 08/26/2014 0907   LYMPHSABS 2.1 12/22/2013 2155   MONOABS 0.5 12/22/2013 2155   EOSABS 0.2 12/22/2013 2155   BASOSABS 0.0 12/22/2013 2155     BNP No results found for: PROBNP  Lipid Panel     Component Value Date/Time   CHOL 151 08/26/2014 0907   TRIG 97 08/26/2014 0907   HDL 52 08/26/2014 0907   CHOLHDL 2.9 08/26/2014 0907   VLDL 19 08/26/2014 0907   LDLCALC 80 08/26/2014 0907      RADIOLOGY: No results found.   ASSESSMENT AND PLAN: Ms. Geri Seminole is a 59 year old African-American female who has previously been documented have mild CAD at initial catheterization in 2004 and subsequent cardiac catheterization in 2009.  She has developed diabetes mellitus, hypertension, obstructive sleep apnea and peripheral neuropathy.  When I last saw her, her blood pressure was elevated on losartan HCT 100/25.  I discussed with her current recommendations regarding optimal blood pressure control, particularly in diabetic.  I added amlodipine 5 mg total medical regimen and her blood pressure today is improved at 130/84.  She has not had any further episodes of chest pain.  Her nuclear perfusion study shows normal perfusion.  Her echocardiographic assessment showed normal systolic function without wall motion abnormalities and fairly normal valvular architecture.  I last saw her, she had mild chest wall discomfort which has improved.  We discussed the importance of weight loss with her mild obesity.  Her airport score suggests mild residual daytime sleepiness.  I have recommended she contact her and the knee company and a download be obtained with reference to her  unit since he may require pressure adjustment.  She is tolerating Crestor for hyperlipidemia with excellent lipid studies.  She's not having significant residual edema on her current dose of furosemide.  His long as she remains stable, I will see her in one year for follow-up evaluation.    Lennette Bihari, MD, [facility] 09/02/2014 11:16 AM

## 2014-09-02 NOTE — Patient Instructions (Signed)
Your physician wants you to follow-up in: 1 year or sooner if needed with Dr. Kelly. You will receive a reminder letter in the mail two months in advance. If you don't receive a letter, please call our office to schedule the follow-up appointment. 

## 2014-09-06 ENCOUNTER — Emergency Department (HOSPITAL_COMMUNITY)
Admission: EM | Admit: 2014-09-06 | Discharge: 2014-09-06 | Disposition: A | Discharge status: Home / Self Care | Payer: Medicare Other | Attending: Emergency Medicine | Admitting: Emergency Medicine

## 2014-09-06 ENCOUNTER — Emergency Department (HOSPITAL_COMMUNITY): Payer: Medicare Other

## 2014-09-06 ENCOUNTER — Encounter (HOSPITAL_COMMUNITY): Payer: Self-pay | Admitting: Emergency Medicine

## 2014-09-06 DIAGNOSIS — Z8719 Personal history of other diseases of the digestive system: Secondary | ICD-10-CM | POA: Diagnosis not present

## 2014-09-06 DIAGNOSIS — R42 Dizziness and giddiness: Secondary | ICD-10-CM | POA: Diagnosis not present

## 2014-09-06 DIAGNOSIS — Z91040 Latex allergy status: Secondary | ICD-10-CM | POA: Diagnosis not present

## 2014-09-06 DIAGNOSIS — I10 Essential (primary) hypertension: Secondary | ICD-10-CM | POA: Insufficient documentation

## 2014-09-06 DIAGNOSIS — E1165 Type 2 diabetes mellitus with hyperglycemia: Secondary | ICD-10-CM | POA: Diagnosis not present

## 2014-09-06 DIAGNOSIS — Z87891 Personal history of nicotine dependence: Secondary | ICD-10-CM | POA: Diagnosis not present

## 2014-09-06 DIAGNOSIS — Z79899 Other long term (current) drug therapy: Secondary | ICD-10-CM | POA: Insufficient documentation

## 2014-09-06 DIAGNOSIS — R51 Headache: Secondary | ICD-10-CM | POA: Diagnosis not present

## 2014-09-06 DIAGNOSIS — E876 Hypokalemia: Secondary | ICD-10-CM | POA: Diagnosis not present

## 2014-09-06 DIAGNOSIS — E119 Type 2 diabetes mellitus without complications: Secondary | ICD-10-CM | POA: Diagnosis not present

## 2014-09-06 LAB — I-STAT CHEM 8, ED
BUN: 8 mg/dL (ref 6–23)
Calcium, Ion: 1.22 mmol/L (ref 1.12–1.23)
Chloride: 101 mmol/L (ref 96–112)
Creatinine, Ser: 0.7 mg/dL (ref 0.50–1.10)
Glucose, Bld: 167 mg/dL — ABNORMAL HIGH (ref 70–99)
HCT: 44 % (ref 36.0–46.0)
Hemoglobin: 15 g/dL (ref 12.0–15.0)
Potassium: 3.4 mmol/L — ABNORMAL LOW (ref 3.5–5.1)
Sodium: 144 mmol/L (ref 135–145)
TCO2: 26 mmol/L (ref 0–100)

## 2014-09-06 LAB — CBC
HCT: 41.6 % (ref 36.0–46.0)
Hemoglobin: 13.8 g/dL (ref 12.0–15.0)
MCH: 28 pg (ref 26.0–34.0)
MCHC: 33.2 g/dL (ref 30.0–36.0)
MCV: 84.4 fL (ref 78.0–100.0)
Platelets: 203 10*3/uL (ref 150–400)
RBC: 4.93 MIL/uL (ref 3.87–5.11)
RDW: 13.2 % (ref 11.5–15.5)
WBC: 5.7 10*3/uL (ref 4.0–10.5)

## 2014-09-06 MED ORDER — MECLIZINE HCL 50 MG PO TABS: 25.0000 mg | ORAL_TABLET | Freq: Three times a day (TID) | ORAL | Status: DC | PRN

## 2014-09-06 MED ORDER — ACETAMINOPHEN 500 MG PO TABS
1000.0000 mg | ORAL_TABLET | Freq: Once | ORAL | Status: AC
  Administered 2014-09-06: 1000 mg via ORAL
  Filled 2014-09-06: qty 2

## 2014-09-06 MED ORDER — POTASSIUM CHLORIDE CRYS ER 20 MEQ PO TBCR
40.0000 meq | EXTENDED_RELEASE_TABLET | Freq: Once | ORAL | Status: AC
  Administered 2014-09-06: 40 meq via ORAL
  Filled 2014-09-06: qty 2

## 2014-09-06 MED ORDER — MECLIZINE HCL 25 MG PO TABS
50.0000 mg | ORAL_TABLET | Freq: Once | ORAL | Status: AC
  Administered 2014-09-06: 50 mg via ORAL
  Filled 2014-09-06: qty 2

## 2014-09-06 NOTE — Discharge Instructions (Signed)
Dizziness Blood sugar today was 167, mildly elevated blood potassium was 3.4, slightly low. Get your blood potassium rechecked in one or 2 weeks at your doctor's office. You should check your blood sugars each morning upon awakening before breakfast and record the number and keep a record to show your doctor. Your blood pressure should be rechecked within the next 2 weeks. Today's was mildly elevated at 147/102. Call the [facility] health and wellness Center or any of the numbers on the resource guide to get a new primary care physician if you desire. Dizziness is a common problem. It is a feeling of unsteadiness or light-headedness. You may feel like you are about to faint. Dizziness can lead to injury if you stumble or fall. A person of any age group can suffer from dizziness, but dizziness is more common in older adults. CAUSES  Dizziness can be caused by many different things, including:  Middle ear problems.  Standing for too long.  Infections.  An allergic reaction.  Aging.  An emotional response to something, such as the sight of blood.  Side effects of medicines.  Tiredness.  Problems with circulation or blood pressure.  Excessive use of alcohol or medicines, or illegal drug use.  Breathing too fast (hyperventilation).  An irregular heart rhythm (arrhythmia).  A low red blood cell count (anemia).  Pregnancy.  Vomiting, diarrhea, fever, or other illnesses that cause body fluid loss (dehydration).  Diseases or conditions such as Parkinson's disease, high blood pressure (hypertension), diabetes, and thyroid problems.  Exposure to extreme heat. DIAGNOSIS  Your health care provider will ask about your symptoms, perform a physical exam, and perform an electrocardiogram (ECG) to record the electrical activity of your heart. Your health care provider may also perform other heart or blood tests to determine the cause of your dizziness. These may include:  Transthoracic  echocardiogram (TTE). During echocardiography, sound waves are used to evaluate how blood flows through your heart.  Transesophageal echocardiogram (TEE).  Cardiac monitoring. This allows your health care provider to monitor your heart rate and rhythm in real time.  Holter monitor. This is a portable device that records your heartbeat and can help diagnose heart arrhythmias. It allows your health care provider to track your heart activity for several days if needed.  Stress tests by exercise or by giving medicine that makes the heart beat faster. TREATMENT  Treatment of dizziness depends on the cause of your symptoms and can vary greatly. HOME CARE INSTRUCTIONS   Drink enough fluids to keep your urine clear or pale yellow. This is especially important in very hot weather. In older adults, it is also important in cold weather.  Take your medicine exactly as directed if your dizziness is caused by medicines. When taking blood pressure medicines, it is especially important to get up slowly.  Rise slowly from chairs and steady yourself until you feel okay.  In the morning, first sit up on the side of the bed. When you feel okay, stand slowly while holding onto something until you know your balance is fine.  Move your legs often if you need to stand in one place for a long time. Tighten and relax your muscles in your legs while standing.  Have someone stay with you for 1-2 days if dizziness continues to be a problem. Do this until you feel you are well enough to stay alone. Have the person call your health care provider if he or she notices changes in you that are concerning.  Do not drive or use heavy machinery if you feel dizzy.  Do not drink alcohol. SEEK IMMEDIATE MEDICAL CARE IF:   Your dizziness or light-headedness gets worse.  You feel nauseous or vomit.  You have problems talking, walking, or using your arms, hands, or legs.  You feel weak.  You are not thinking clearly or  you have trouble forming sentences. It may take a friend or family member to notice this.  You have chest pain, abdominal pain, shortness of breath, or sweating.  Your vision changes.  You notice any bleeding.  You have side effects from medicine that seems to be getting worse rather than better. MAKE SURE YOU:   Understand these instructions.  Will watch your condition.  Will get help right away if you are not doing well or get worse. Document Released: 01/17/2001 Document Revised: 07/29/2013 Document Reviewed: 02/10/2011 [facility] Patient Information 2015 [place], [place]. This information is not intended to replace advice given to you by your health care provider. Make sure you discuss any questions you have with your health care provider.  Emergency Department Resource Guide 1) Find a Doctor and Pay Out of Pocket Although you won't have to find out who is covered by your insurance plan, it is a good idea to ask around and get recommendations. You will then need to call the office and see if the doctor you have chosen will accept you as a new patient and what types of options they offer for patients who are self-pay. Some doctors offer discounts or will set up payment plans for their patients who do not have insurance, but you will need to ask so you aren't surprised when you get to your appointment.  2) Contact Your Local Health Department Not all health departments have doctors that can see patients for sick visits, but many do, so it is worth a call to see if yours does. If you don't know where your local health department is, you can check in your phone book. The CDC also has a tool to help you locate your state's health department, and many state websites also have listings of all of their local health departments.  3) Find a Walk-in Clinic If your illness is not likely to be very severe or complicated, you may want to try a walk in clinic. These are popping up all over the country  in pharmacies, drugstores, and shopping centers. They're usually staffed by nurse practitioners or physician assistants that have been trained to treat common illnesses and complaints. They're usually fairly quick and inexpensive. However, if you have serious medical issues or chronic medical problems, these are probably not your best option.  No Primary Care Doctor: - Call Health Connect at  [phone] - they can help you locate a primary care doctor that  accepts your insurance, provides certain services, etc. - Physician Referral Service- [phone]  Chronic Pain Problems: Organization         Address  Phone   Notes  [facility]  [phone] Patients need to be referred by their primary care doctor.   Medication Assistance: Organization         Address  Phone   Notes  [facility] Medication [facility] [address] [employer] [place], [place] [address] [phone] --Must be a resident of [facility] -- Must have NO insurance coverage whatsoever (no Medicaid/ Medicare, etc.) -- The pt. MUST have a primary care doctor that directs their care regularly and  follows them in the community   MedAssist  [phone]   [employer]  [phone]    Agencies that provide inexpensive medical care: Organization         Address  Phone   Notes  [facility]  [phone]   [facility]    [phone]) [phone]   [facility] [address] [place], [place] [id number] [phone]) [phone]   [facility] [address], [place] [phone]) [phone]   Planned Parenthood    [phone]) [phone]   [facility]    [phone](336) [phone]   [facility]  [address], [place] Phone:  [phone], Fax:  [phone] Hours of Operation:  9 am - 6 pm, M-F.  Also accepts Medicaid/Medicare and self-pay.  [facility]  [address],  [place] Phone: [phone]) [phone], Fax: [phone]. Hours of Operation:  8:30 am - 5:30 pm, M-F.  Also accepts Medicaid and self-pay.  [facility] [place] [address], [place] Phone: [phone]) [phone]   [facility] [address], [place], [place] [phone], Ext. [phone] Mondays & Thursdays: 7-9 AM.  First 15 patients are seen on a first come, first serve basis.    Medicaid-accepting [facility] Providers:  Organization         Address  Phone   Notes  [facility] [address], [place] [phone] Also accepts self-pay patients.  [facility] [address], [place]  [phone]   [facility] [address], [place] [phone]   [facility] [address] [place] [address], [place] [phone]) [phone]   Renaye RakersVeita Bland [address], [place]   [phone] Only accepts [place] Access [place] patients after they have their name applied to their card.   Self-Pay (no insurance) in [facility]:  Organization         Address  Phone   Notes  Sickle Cell Patients, [facility] [address], [place] [phone]) [phone]   [facility] [address], [place] [phone]) [phone]   [facility] [place]  [address], [place] [phone]) [phone]   [facility]/Dr. Osei-Bonsu  [address] [place] [address], [place] or [phone], [place] [phone] Phone number for both [place] and [place] locations is the same.  [facility] [address], [place] [phone]   [facility] [place] [address] [place] [address], [place] or [address] [phone]) [phone]) [phone]   [facility] [address], [place] [phone], phone; [phone], fax Sees patients 1st and 3rd Saturday of every month.  Must not  qualify for public or private insurance (i.e. Medicaid, Medicare, [place] Health Choice, Veterans' Benefits)  Household income should be no more than 200% of the poverty level The clinic cannot treat you if you are pregnant or think you are pregnant  Sexually transmitted diseases are not treated at the clinic.    Dental Care: Organization         Address  Phone  Notes  [facility] Department of [facility] [address], [place] [phone] Accepts children up to age 59 who are  enrolled in Medicaid or [place] Health Choice; pregnant women with a Medicaid card; and children who have applied for Medicaid or [place] Health Choice, but were declined, whose parents can pay a reduced fee at time of service.  [facility]  [address], [place] [phone] Accepts children up to age 35 who are enrolled in [place] or [place] Health Choice; pregnant women with a Medicaid card; and children who have applied for Medicaid or [place] Health Choice, but were declined, whose parents can pay a reduced fee at time of service.  [facility]  [address] [place], [place] [phone] Patients are seen by appointment only. Walk-ins are not accepted. [facility] will see patients 17 years of age and older. Monday - Tuesday (8am-5pm) Most Wednesdays (8:30-5pm) $30 per visit, cash only  [facility] Adult Dental Access PROGRAM  [address], [facility] [phone] Patients are seen by appointment only. Walk-ins are not accepted. [facility] will see patients 59 years of age and older. One Wednesday Evening (Monthly: Volunteer Based).  $30 per visit, cash only  [employer]  [phone] for adults; Children under age 58, call Graduate Pediatric Dentistry at [phone]. Children aged 61-14, please call [phone] to request a pediatric application.  Dental services are provided  in all areas of dental care including fillings, crowns and bridges, complete and partial dentures, implants, gum treatment, root canals, and extractions. Preventive care is also provided. Treatment is provided to both adults and children. Patients are selected via a lottery and there is often a waiting list.   [facility] [address], [place]  [phone] [email or web address]   [facility] [address] [place], [place] [phone], Ext. [phone] Second and Fourth Thursday of each month, opens at 6:30 AM; Clinic ends at 9 AM.  Patients are seen on a first-come first-served basis, and a limited number are seen during each clinic.   [facility]  [address] [place], [place] [phone]   Eligibility Requirements You must have lived in [place], [place], or [place] counties for at least the last three months.   You cannot be eligible for state or federal sponsored [place], including [employer], [place], or [employer].   You generally cannot be eligible for healthcare insurance through your employer.    How to apply: Eligibility screenings are held every Tuesday and Wednesday afternoon from 1:00 pm until 4:00 pm. You do not need an appointment for the interview!  [facility] [address], [place], [place] [phone]   [facility] Health Department  [phone]   [facility] Health Department  [phone]   [facility] Health Department  [phone]    Behavioral Health Resources in the Community: Intensive Outpatient Programs Organization         Address  Phone  Notes  [facility] [address] [place], [place] [phone]   [facility] Outpatient [address], [place], [place] [phone]   ADS: Alcohol & Drug S[facility] [address] [place], [place]  [phone]   [facility] Mental Health [address]  [place], [place]  [phone] or [phone]   Substance Abuse Resources Organization         Address  Phone  Notes  Alcohol and Drug Services  [phone]   [facility]  [phone]  The [place]   Chinita Pester  [phone]   Residential & Outpatient Substance Abuse Program  913-664-9246   Psychological Services Organization         Address  Phone  Notes  [facility] [place]  [place]  [phone]   [place] [address] [place] or [phone]    Mobile Crisis Teams Organization         Address  Phone  Notes  [facility], Mobile Crisis Care Unit  [phone]   Assertive Psychotherapeutic Services  [address] [place], [place]   [place] Levels [address], [place] [phone]    Self-Help/Support Groups Organization         Address  Phone             Notes  [place] of [place] - variety of support groups  [place] Call for more information  Narcotics Anonymous (NA), [facility] [address], [employer] [place]  2 meetings at this location   Special educational needs teacher         Address  Phone  Notes  ASAP Residential Treatment [place],    [place]  [phone]   [facility]  [address] [place] [id number], [place], [place]   [place], [place] [phone] Admissions: 8am-3pm M-F  Incentives Substance [place] [address],    [place], [place] [phone]   [facility] [address] [place], [place], [place]   [facility] [address],  [place], [place]   Insight Programs - Intensive Outpatient [address], [place], [place]   [facility] ([place].,  [place], [place] [phone] or  [phone]   [facility] Services (RTS) [address], [place], [place] Accepts Medicaid  [facility] [place] [address]  [place] [phone] Substance Abuse/Addiction Treatment   [facility] Organization         Address  Phone  Notes  [facility]  [phone]   Domenic Schwab, PhD [address]lis Porta [place], [place]   [phone] or [phone]   [place], [place] [phone]   [facility] [address] [place], [place] [phone] Insurance/Medicaid/sponsorship through [facility] [address] [place]                                    [place], [place] [phone] [place] [address], [place] [phone]    Dr. Adele Schilder  [phone]   [facility] Dept. 1) [address] [place] 2) [place] 3)  [place] [address], [place] [phone]  [phone]   [place] [phone] or [phone] (After Hours)

## 2014-09-06 NOTE — ED Notes (Signed)
Per pt, states she got dizzy yesterday-has been treated for vertigo in the past-states she is a diabetic and does not check her sugar

## 2014-09-06 NOTE — ED Provider Notes (Addendum)
CSN: [id number]     Arrival date & time 09/06/14  4098 History   First MD Initiated Contact with Patient 09/06/14 0809     Chief Complaint  Patient presents with  . Dizziness     (Consider location/radiation/quality/duration/timing/severity/associated sxs/prior Treatment) HPI Patient reports that she's been "staggering" and feeling off balance and room is spinning  for 1 week. She reports headaches daily for the past one year. She has not checked blood sugar for greater than 1 week. Nothing makes symptoms better or worse. No visual changes. Patient also complains of pain behind right ear for approximately the past 2 days no other associated symptoms pain is worse with pressing on the area no fever. No other associated symptoms. No treatment prior to coming here Past Medical History  Diagnosis Date  . Diabetes mellitus   . Hypertension   . Hypokalemia   . Diverticulitis   . Hypercholesteremia   . Vertigo     patient reported  . Chest pain 2009    echo   sleep apnea Past Surgical History  Procedure Laterality Date  . Cholecystectomy    . Abdominal hysterectomy    . Appendectomy    . Wisdom tooth extraction    . Cardiac catheterization  2009    normal cath  . Cardiac catheterization  2004    normal cath   Family History  Problem Relation Age of Onset  . Diabetes Mother   . Gout Mother   . Hypertension Mother   . Arthritis Mother   . Heart disease Father   . Heart failure Sister   . Arthritis Sister   . Heart disease Maternal Grandmother   . Diabetes Maternal Grandmother   . Heart disease Paternal Grandmother   . Diabetes Paternal Grandmother   . Diabetes Sister   . Bursitis Sister   . Crohn's disease Sister    History  Substance Use Topics  . Smoking status: Former Games developer  . Smokeless tobacco: Never Used  . Alcohol Use: Yes     Comment: occasional / social   OB History    No data available     Review of Systems  Constitutional: Negative.   Respiratory:  Negative.   Cardiovascular: Negative.   Gastrointestinal: Negative.   Musculoskeletal: Negative.   Skin: Negative.   Allergic/Immunologic: Positive for immunocompromised state.       Diabetic  Neurological: Positive for dizziness and headaches.  Psychiatric/Behavioral: Negative.   All other systems reviewed and are negative.     Allergies  Asa and Latex  Home Medications   Prior to Admission medications   Medication Sig Start Date End Date Taking? Authorizing Provider  ACCU-CHEK AVIVA PLUS test strip  04/16/14   Historical Provider, MD  ACCU-CHEK SOFTCLIX LANCETS lancets  04/16/14   Historical Provider, MD  amLODipine (NORVASC) 5 MG tablet Take 1 tablet (5 mg total) by mouth daily. 07/08/14   Lennette Bihari, MD  CRESTOR 20 MG tablet Take 20 mg by mouth at bedtime. 05/06/14   Historical Provider, MD  cyclobenzaprine (FLEXERIL) 5 MG tablet Take 5 mg by mouth daily.    Historical Provider, MD  furosemide (LASIX) 20 MG tablet Take 1 tablet by mouth daily. 08/28/14   Historical Provider, MD  gabapentin (NEURONTIN) 300 MG capsule Take 2 capsules by mouth 3 (three) times daily. 06/22/14   Historical Provider, MD  glimepiride (AMARYL) 4 MG tablet Take 4 mg by mouth daily with breakfast.    Historical Provider, MD  HYDROcodone-acetaminophen (  NORCO/VICODIN) 5-325 MG per tablet Take 1 tablet by mouth every 6 (six) hours as needed. 08/05/14   Historical Provider, MD  ketoconazole (NIZORAL) 2 % shampoo Apply 1 application topically 2 (two) times a week. 08/05/14   Historical Provider, MD   BP 152/100 mmHg  Pulse 90  Temp(Src) 98.1 F (36.7 C) (Oral)  Resp 17  SpO2 94% Physical Exam  Constitutional: She is oriented to person, place, and time. She appears well-developed and well-nourished.  HENT:  Head: Normocephalic and atraumatic.  Right Ear: External ear normal.  Left Ear: External ear normal.  Nose: Nose normal.  Mouth/Throat: Oropharynx is clear and moist.  Oropharynx is normal.  Bilateral tympanic membranes normal. She is tender right mastoid process. No redness or swelling  Eyes: Conjunctivae are normal. Pupils are equal, round, and reactive to light.  Neck: Neck supple. No tracheal deviation present. No thyromegaly present.  Cardiovascular: Normal rate and regular rhythm.   No murmur heard. Pulmonary/Chest: Effort normal and breath sounds normal.  Abdominal: Soft. Bowel sounds are normal. She exhibits no distension. There is no tenderness.  Musculoskeletal: Normal range of motion. She exhibits no edema or tenderness.  Lymphadenopathy:    She has no cervical adenopathy.  Neurological: She is alert and oriented to person, place, and time. She has normal reflexes. Coordination normal.  Gait normal Romberg normal pronator drift normal DTR symmetric bilaterally at knee jerk ankle jerk biceps toes downgoing bilaterally finger-nose normal  Skin: Skin is warm and dry. No rash noted.  Psychiatric: She has a normal mood and affect.  Nursing note and vitals reviewed.   ED Course  Procedures (including critical care time) Labs Review Labs Reviewed - No data to display  Imaging Review No results found.   EKG Interpretation   Date/Time:  Sunday September 06 2014 11:07:28 EST Ventricular Rate:  85 PR Interval:  185 QRS Duration: 89 QT Interval:  379 QTC Calculation: 451 R Axis:   21 Text Interpretation:  Sinus rhythm No significant change since last  tracing Confirmed by Wilmina Maxham  MD, Teliyah Royal ([id number]) on 09/06/2014 11:13:26 AM     10 :25 AM patient alert Glasgow Coma Score 15 no distress requesting Tylenol for pain, which was ordered by me. Also ordered was potassium chloride 40 mEq orally MDM  Vertigo is felt to be peripheral in etiology. No signs of mastoiditis With right ear complaint. She's been helped by meclizine in the past Plan prescription meclizine, she should have her serum potassium rechecked within 1-2 weeks. She's asked for referral to new primary care  physician. She'll be given resource guide and referral [facility]. Blood pressure recheck 3 weeks Diagnosis #1 vertigo #2 hypokalemia #3 hyperglycemia #4 hypertension Final diagnoses:  None        Doug SouSam Keyen Marban, MD 09/06/14 1044   Addendum at 11 AM patient complained of left anterior chest pain rating to left arm which began "just a couple minutes ago" after "I became frustrated". Patient states she doesn't know why she has all these problems. On exam patient is mildly tearful lungs clear auscultation heart regular rate and rhythm no murmurs. I explained patient's discharge instructions again to both she and her family and did my best to reassure her. I don't feel that she is suffering from acute coronary syndrome with unchanged EKG and normal myocardial perfusion study earlier this month  Doug SouSam Arthur Aydelotte, MD 09/06/14 1118

## 2014-09-06 NOTE — ED Notes (Signed)
Attempted to d/c pt and she c/o headache and CP. Dr Shela CommonsJ notified and sts that he will come see pt

## 2014-09-08 DIAGNOSIS — G4733 Obstructive sleep apnea (adult) (pediatric): Secondary | ICD-10-CM | POA: Diagnosis not present

## 2014-09-09 DIAGNOSIS — G4733 Obstructive sleep apnea (adult) (pediatric): Secondary | ICD-10-CM | POA: Diagnosis not present

## 2014-09-09 DIAGNOSIS — R5383 Other fatigue: Secondary | ICD-10-CM | POA: Diagnosis not present

## 2014-09-09 DIAGNOSIS — R0683 Snoring: Secondary | ICD-10-CM | POA: Diagnosis not present

## 2014-09-09 DIAGNOSIS — J039 Acute tonsillitis, unspecified: Secondary | ICD-10-CM | POA: Diagnosis not present

## 2014-09-23 DIAGNOSIS — H6123 Impacted cerumen, bilateral: Secondary | ICD-10-CM | POA: Diagnosis not present

## 2014-09-23 DIAGNOSIS — J029 Acute pharyngitis, unspecified: Secondary | ICD-10-CM | POA: Diagnosis not present

## 2014-09-30 DIAGNOSIS — H539 Unspecified visual disturbance: Secondary | ICD-10-CM | POA: Diagnosis not present

## 2014-09-30 DIAGNOSIS — E119 Type 2 diabetes mellitus without complications: Secondary | ICD-10-CM | POA: Diagnosis not present

## 2014-09-30 DIAGNOSIS — F329 Major depressive disorder, single episode, unspecified: Secondary | ICD-10-CM | POA: Diagnosis not present

## 2014-09-30 DIAGNOSIS — K219 Gastro-esophageal reflux disease without esophagitis: Secondary | ICD-10-CM | POA: Diagnosis not present

## 2014-09-30 DIAGNOSIS — Z0000 Encounter for general adult medical examination without abnormal findings: Secondary | ICD-10-CM | POA: Diagnosis not present

## 2014-09-30 DIAGNOSIS — E785 Hyperlipidemia, unspecified: Secondary | ICD-10-CM | POA: Diagnosis not present

## 2014-09-30 DIAGNOSIS — I10 Essential (primary) hypertension: Secondary | ICD-10-CM | POA: Diagnosis not present

## 2014-09-30 DIAGNOSIS — N390 Urinary tract infection, site not specified: Secondary | ICD-10-CM | POA: Diagnosis not present

## 2014-09-30 DIAGNOSIS — E538 Deficiency of other specified B group vitamins: Secondary | ICD-10-CM | POA: Diagnosis not present

## 2014-09-30 DIAGNOSIS — E876 Hypokalemia: Secondary | ICD-10-CM | POA: Diagnosis not present

## 2014-09-30 DIAGNOSIS — R94120 Abnormal auditory function study: Secondary | ICD-10-CM | POA: Diagnosis not present

## 2014-10-06 DIAGNOSIS — G4733 Obstructive sleep apnea (adult) (pediatric): Secondary | ICD-10-CM | POA: Diagnosis not present

## 2014-10-07 DIAGNOSIS — G4733 Obstructive sleep apnea (adult) (pediatric): Secondary | ICD-10-CM | POA: Diagnosis not present

## 2014-10-07 DIAGNOSIS — R5383 Other fatigue: Secondary | ICD-10-CM | POA: Diagnosis not present

## 2014-10-18 ENCOUNTER — Emergency Department (HOSPITAL_COMMUNITY)
Admission: EM | Admit: 2014-10-18 | Discharge: 2014-10-18 | Disposition: A | Discharge status: Home / Self Care | Payer: No Typology Code available for payment source | Attending: Emergency Medicine | Admitting: Emergency Medicine

## 2014-10-18 ENCOUNTER — Encounter (HOSPITAL_COMMUNITY): Payer: Self-pay | Admitting: Emergency Medicine

## 2014-10-18 ENCOUNTER — Emergency Department (HOSPITAL_COMMUNITY): Payer: No Typology Code available for payment source

## 2014-10-18 DIAGNOSIS — S3992XA Unspecified injury of lower back, initial encounter: Secondary | ICD-10-CM | POA: Diagnosis not present

## 2014-10-18 DIAGNOSIS — Y92410 Unspecified street and highway as the place of occurrence of the external cause: Secondary | ICD-10-CM | POA: Insufficient documentation

## 2014-10-18 DIAGNOSIS — Z8719 Personal history of other diseases of the digestive system: Secondary | ICD-10-CM | POA: Diagnosis not present

## 2014-10-18 DIAGNOSIS — Z9889 Other specified postprocedural states: Secondary | ICD-10-CM | POA: Diagnosis not present

## 2014-10-18 DIAGNOSIS — Y998 Other external cause status: Secondary | ICD-10-CM | POA: Insufficient documentation

## 2014-10-18 DIAGNOSIS — Y9389 Activity, other specified: Secondary | ICD-10-CM | POA: Insufficient documentation

## 2014-10-18 DIAGNOSIS — S161XXA Strain of muscle, fascia and tendon at neck level, initial encounter: Secondary | ICD-10-CM | POA: Insufficient documentation

## 2014-10-18 DIAGNOSIS — Z91040 Latex allergy status: Secondary | ICD-10-CM | POA: Insufficient documentation

## 2014-10-18 DIAGNOSIS — M25511 Pain in right shoulder: Secondary | ICD-10-CM | POA: Diagnosis not present

## 2014-10-18 DIAGNOSIS — I10 Essential (primary) hypertension: Secondary | ICD-10-CM | POA: Diagnosis not present

## 2014-10-18 DIAGNOSIS — E119 Type 2 diabetes mellitus without complications: Secondary | ICD-10-CM | POA: Diagnosis not present

## 2014-10-18 DIAGNOSIS — Z87891 Personal history of nicotine dependence: Secondary | ICD-10-CM | POA: Insufficient documentation

## 2014-10-18 DIAGNOSIS — Z79899 Other long term (current) drug therapy: Secondary | ICD-10-CM | POA: Insufficient documentation

## 2014-10-18 DIAGNOSIS — S4991XA Unspecified injury of right shoulder and upper arm, initial encounter: Secondary | ICD-10-CM | POA: Diagnosis not present

## 2014-10-18 DIAGNOSIS — S199XXA Unspecified injury of neck, initial encounter: Secondary | ICD-10-CM | POA: Diagnosis present

## 2014-10-18 MED ORDER — CYCLOBENZAPRINE HCL 10 MG PO TABS: 10.0000 mg | ORAL_TABLET | Freq: Two times a day (BID) | ORAL | Status: DC | PRN

## 2014-10-18 MED ORDER — NAPROXEN 500 MG PO TABS: 500.0000 mg | ORAL_TABLET | Freq: Two times a day (BID) | ORAL | Status: DC

## 2014-10-18 MED ORDER — LIDOCAINE 5 % EX PTCH: 1.0000 | MEDICATED_PATCH | CUTANEOUS | Status: DC
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
## 2014-10-19 DIAGNOSIS — M5412 Radiculopathy, cervical region: Secondary | ICD-10-CM | POA: Diagnosis not present

## 2014-10-19 DIAGNOSIS — M5416 Radiculopathy, lumbar region: Secondary | ICD-10-CM | POA: Diagnosis not present

## 2014-10-21 DIAGNOSIS — R400 Somnolence: Secondary | ICD-10-CM | POA: Diagnosis not present

## 2014-10-21 DIAGNOSIS — H539 Unspecified visual disturbance: Secondary | ICD-10-CM | POA: Diagnosis not present

## 2014-10-21 DIAGNOSIS — E785 Hyperlipidemia, unspecified: Secondary | ICD-10-CM | POA: Diagnosis not present

## 2014-10-21 DIAGNOSIS — E876 Hypokalemia: Secondary | ICD-10-CM | POA: Diagnosis not present

## 2014-10-21 DIAGNOSIS — K219 Gastro-esophageal reflux disease without esophagitis: Secondary | ICD-10-CM | POA: Diagnosis not present

## 2014-10-21 DIAGNOSIS — E538 Deficiency of other specified B group vitamins: Secondary | ICD-10-CM | POA: Diagnosis not present

## 2014-10-21 DIAGNOSIS — I10 Essential (primary) hypertension: Secondary | ICD-10-CM | POA: Diagnosis not present

## 2014-10-21 DIAGNOSIS — E119 Type 2 diabetes mellitus without complications: Secondary | ICD-10-CM | POA: Diagnosis not present

## 2014-10-22 ENCOUNTER — Other Ambulatory Visit (HOSPITAL_BASED_OUTPATIENT_CLINIC_OR_DEPARTMENT_OTHER): Payer: Self-pay | Admitting: Internal Medicine

## 2014-10-22 DIAGNOSIS — Z1231 Encounter for screening mammogram for malignant neoplasm of breast: Secondary | ICD-10-CM

## 2014-10-23 ENCOUNTER — Encounter (HOSPITAL_COMMUNITY): Payer: Self-pay | Admitting: Emergency Medicine

## 2014-10-23 ENCOUNTER — Emergency Department (HOSPITAL_COMMUNITY)
Admission: EM | Admit: 2014-10-23 | Discharge: 2014-10-24 | Disposition: A | Discharge status: Home / Self Care | Payer: Medicare Other | Attending: Emergency Medicine | Admitting: Emergency Medicine

## 2014-10-23 DIAGNOSIS — Z791 Long term (current) use of non-steroidal anti-inflammatories (NSAID): Secondary | ICD-10-CM | POA: Insufficient documentation

## 2014-10-23 DIAGNOSIS — Z8719 Personal history of other diseases of the digestive system: Secondary | ICD-10-CM | POA: Diagnosis not present

## 2014-10-23 DIAGNOSIS — Z9889 Other specified postprocedural states: Secondary | ICD-10-CM | POA: Insufficient documentation

## 2014-10-23 DIAGNOSIS — E119 Type 2 diabetes mellitus without complications: Secondary | ICD-10-CM | POA: Insufficient documentation

## 2014-10-23 DIAGNOSIS — R079 Chest pain, unspecified: Secondary | ICD-10-CM | POA: Diagnosis present

## 2014-10-23 DIAGNOSIS — R0789 Other chest pain: Secondary | ICD-10-CM | POA: Insufficient documentation

## 2014-10-23 DIAGNOSIS — Z87891 Personal history of nicotine dependence: Secondary | ICD-10-CM | POA: Diagnosis not present

## 2014-10-23 DIAGNOSIS — Z91040 Latex allergy status: Secondary | ICD-10-CM | POA: Insufficient documentation

## 2014-10-23 DIAGNOSIS — I10 Essential (primary) hypertension: Secondary | ICD-10-CM | POA: Insufficient documentation

## 2014-10-23 DIAGNOSIS — E876 Hypokalemia: Secondary | ICD-10-CM

## 2014-10-23 DIAGNOSIS — Z79899 Other long term (current) drug therapy: Secondary | ICD-10-CM | POA: Insufficient documentation

## 2014-10-23 LAB — CBC
HCT: 42.9 % (ref 36.0–46.0)
Hemoglobin: 14.1 g/dL (ref 12.0–15.0)
MCH: 27.6 pg (ref 26.0–34.0)
MCHC: 32.9 g/dL (ref 30.0–36.0)
MCV: 84.1 fL (ref 78.0–100.0)
Platelets: 194 10*3/uL (ref 150–400)
RBC: 5.1 MIL/uL (ref 3.87–5.11)
RDW: 13.5 % (ref 11.5–15.5)
WBC: 9.3 10*3/uL (ref 4.0–10.5)

## 2014-10-23 LAB — BASIC METABOLIC PANEL
Anion gap: 11 (ref 5–15)
BUN: 12 mg/dL (ref 6–23)
CO2: 27 mmol/L (ref 19–32)
Calcium: 9.2 mg/dL (ref 8.4–10.5)
Chloride: 101 mmol/L (ref 96–112)
Creatinine, Ser: 0.66 mg/dL (ref 0.50–1.10)
GFR calc Af Amer: >90 mL/min (ref >90)
GFR calc non Af Amer: >90 mL/min (ref >90)
Glucose, Bld: 292 mg/dL — ABNORMAL HIGH (ref 70–99)
Potassium: 3.1 mmol/L — ABNORMAL LOW (ref 3.5–5.1)
Sodium: 139 mmol/L (ref 135–145)

## 2014-10-23 LAB — I-STAT TROPONIN, ED: Troponin i, poc: 0 ng/mL (ref 0.00–0.08)
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
## 2014-10-24 DIAGNOSIS — R0789 Other chest pain: Secondary | ICD-10-CM | POA: Diagnosis not present

## 2014-10-24 LAB — D-DIMER, QUANTITATIVE: D-Dimer, Quant: 0.28 ug/mL-FEU (ref 0.00–0.48)

## 2014-10-24 MED ORDER — POTASSIUM CHLORIDE CRYS ER 20 MEQ PO TBCR: 20.0000 meq | EXTENDED_RELEASE_TABLET | Freq: Two times a day (BID) | ORAL | Status: DC

## 2014-10-24 MED ORDER — POTASSIUM CHLORIDE CRYS ER 20 MEQ PO TBCR
40.0000 meq | EXTENDED_RELEASE_TABLET | Freq: Once | ORAL | Status: AC
  Administered 2014-10-24: 40 meq via ORAL
  Filled 2014-10-24: qty 2

## 2014-10-24 MED ORDER — NAPROXEN 500 MG PO TABS: 500.0000 mg | ORAL_TABLET | Freq: Two times a day (BID) | ORAL | Status: DC

## 2014-10-24 MED ORDER — IBUPROFEN 800 MG PO TABS
800.0000 mg | ORAL_TABLET | Freq: Once | ORAL | Status: AC
  Administered 2014-10-24: 800 mg via ORAL
  Filled 2014-10-24: qty 1
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
## 2014-10-24 NOTE — Discharge Instructions (Signed)
Chest Pain (Nonspecific) °It is often hard to give a specific diagnosis for the cause of chest pain. There is always a chance that your pain could be related to something serious, such as a heart attack or a blood clot in the lungs. You need to follow up with your health care provider for further evaluation. °CAUSES  °· Heartburn. °· Pneumonia or bronchitis. °· Anxiety or stress. °· Inflammation around your heart (pericarditis) or lung (pleuritis or pleurisy). °· A blood clot in the lung. °· A collapsed lung (pneumothorax). It can develop suddenly on its own (spontaneous pneumothorax) or from trauma to the chest. °· Shingles infection (herpes zoster virus). °The chest wall is composed of bones, muscles, and cartilage. Any of these can be the source of the pain. °· The bones can be bruised by injury. °· The muscles or cartilage can be strained by coughing or overwork. °· The cartilage can be affected by inflammation and become sore (costochondritis). °DIAGNOSIS  °Lab tests or other studies may be needed to find the cause of your pain. Your health care provider may have you take a test called an ambulatory electrocardiogram (ECG). An ECG records your heartbeat patterns over a 24-hour period. You may also have other tests, such as: °· Transthoracic echocardiogram (TTE). During echocardiography, sound waves are used to evaluate how blood flows through your heart. °· Transesophageal echocardiogram (TEE). °· Cardiac monitoring. This allows your health care provider to monitor your heart rate and rhythm in real time. °· Holter monitor. This is a portable device that records your heartbeat and can help diagnose heart arrhythmias. It allows your health care provider to track your heart activity for several days, if needed. °· Stress tests by exercise or by giving medicine that makes the heart beat faster. °TREATMENT  °· Treatment depends on what may be causing your chest pain. Treatment may include: °· Acid blockers for  heartburn. °· Anti-inflammatory medicine. °· Pain medicine for inflammatory conditions. °· Antibiotics if an infection is present. °· You may be advised to change lifestyle habits. This includes stopping smoking and avoiding alcohol, caffeine, and chocolate. °· You may be advised to keep your head raised (elevated) when sleeping. This reduces the chance of acid going backward from your stomach into your esophagus. °Most of the time, nonspecific chest pain will improve within 2-3 days with rest and mild pain medicine.  °HOME CARE INSTRUCTIONS  °· If antibiotics were prescribed, take them as directed. Finish them even if you start to feel better. °· For the next few days, avoid physical activities that bring on chest pain. Continue physical activities as directed. °· Do not use any tobacco products, including cigarettes, chewing tobacco, or electronic cigarettes. °· Avoid drinking alcohol. °· Only take medicine as directed by your health care provider. °· Follow your health care provider's suggestions for further testing if your chest pain does not go away. °· Keep any follow-up appointments you made. If you do not go to an appointment, you could develop lasting (chronic) problems with pain. If there is any problem keeping an appointment, call to reschedule. °SEEK MEDICAL CARE IF:  °· Your chest pain does not go away, even after treatment. °· You have a rash with blisters on your chest. °· You have a fever. °SEEK IMMEDIATE MEDICAL CARE IF:  °· You have increased chest pain or pain that spreads to your arm, neck, jaw, back, or abdomen. °· You have shortness of breath. °· You have an increasing cough, or you cough   up blood.  You have severe back or abdominal pain.  You feel nauseous or vomit.  You have severe weakness.  You faint.  You have chills. This is an emergency. Do not wait to see if the pain will go away. Get medical help at once. Call your local emergency services (911 in [place] Do not drive  yourself to the hospital. MAKE SURE YOU:   Understand these instructions.  Will watch your condition.  Will get help right away if you are not doing well or get worse. Document Released: 05/03/2005 Document Revised: 07/29/2013 Document Reviewed: 02/27/2008 [facility] Patient Information 2015 [place], [place]. This information is not intended to replace advice given to you by your health care provider. Make sure you discuss any questions you have with your health care provider.  Hypokalemia Hypokalemia means that the amount of potassium in the blood is lower than normal.Potassium is a chemical, called an electrolyte, that helps regulate the amount of fluid in the body. It also stimulates muscle contraction and helps nerves function properly.Most of the body's potassium is inside of cells, and only a very small amount is in the blood. Because the amount in the blood is so small, minor changes can be life-threatening. CAUSES  Antibiotics.  Diarrhea or vomiting.  Using laxatives too much, which can cause diarrhea.  Chronic kidney disease.  Water pills (diuretics).  Eating disorders (bulimia).  Low magnesium level.  Sweating a lot. SIGNS AND SYMPTOMS  Weakness.  Constipation.  Fatigue.  Muscle cramps.  Mental confusion.  Skipped heartbeats or irregular heartbeat (palpitations).  Tingling or numbness. DIAGNOSIS  Your health care provider can diagnose hypokalemia with blood tests. In addition to checking your potassium level, your health care provider may also check other lab tests. TREATMENT Hypokalemia can be treated with potassium supplements taken by mouth or adjustments in your current medicines. If your potassium level is very low, you may need to get potassium through a vein (IV) and be monitored in the hospital. A diet high in potassium is also helpful. Foods high in potassium are:  Nuts, such as peanuts and pistachios.  Seeds, such as sunflower seeds and pumpkin  seeds.  Peas, lentils, and lima beans.  Whole grain and bran cereals and breads.  Fresh fruit and vegetables, such as apricots, avocado, bananas, cantaloupe, kiwi, oranges, tomatoes, asparagus, and potatoes.  Orange and tomato juices.  Red meats.  Fruit yogurt. HOME CARE INSTRUCTIONS  Take all medicines as prescribed by your health care provider.  Maintain a healthy diet by including nutritious food, such as fruits, vegetables, nuts, whole grains, and lean meats.  If you are taking a laxative, be sure to follow the directions on the label. SEEK MEDICAL CARE IF:  Your weakness gets worse.  You feel your heart pounding or racing.  You are vomiting or having diarrhea.  You are diabetic and having trouble keeping your blood glucose in the normal range. SEEK IMMEDIATE MEDICAL CARE IF:  You have chest pain, shortness of breath, or dizziness.  You are vomiting or having diarrhea for more than 2 days.  You faint. MAKE SURE YOU:   Understand these instructions.  Will watch your condition.  Will get help right away if you are not doing well or get worse. Document Released: 07/24/2005 Document Revised: 05/14/2013 Document Reviewed: 01/24/2013 [facility] Patient Information 2015 [place], [place]. This information is not intended to replace advice given to you by your health care provider. Make sure you discuss any questions you have with your  health care provider.  Naproxen and naproxen sodium oral immediate-release tablets What is this medicine? NAPROXEN (na PROX en) is a non-steroidal anti-inflammatory drug (NSAID). It is used to reduce swelling and to treat pain. This medicine may be used for dental pain, headache, or painful monthly periods. It is also used for painful joint and muscular problems such as arthritis, tendinitis, bursitis, and gout. This medicine may be used for other purposes; ask your health care provider or pharmacist if you have questions. COMMON BRAND  NAME(S): Aflaxen, Aleve, Aleve Arthritis, All Day Relief, Anaprox, Anaprox DS, Naprosyn What should I tell my health care provider before I take this medicine? They need to know if you have any of these conditions: -asthma -cigarette smoker -drink more than 3 alcohol containing drinks a day -heart disease or circulation problems such as heart failure or leg edema (fluid retention) -high blood pressure -kidney disease -liver disease -stomach bleeding or ulcers -an unusual or allergic reaction to naproxen, aspirin, other NSAIDs, other medicines, foods, dyes, or preservatives -pregnant or trying to get pregnant -breast-feeding How should I use this medicine? Take this medicine by mouth with a glass of water. Follow the directions on the prescription label. Take it with food if your stomach gets upset. Try to not lie down for at least 10 minutes after you take it. Take your medicine at regular intervals. Do not take your medicine more often than directed. Long-term, continuous use may increase the risk of heart attack or stroke. A special MedGuide will be given to you by the pharmacist with each prescription and refill. Be sure to read this information carefully each time. Talk to your pediatrician regarding the use of this medicine in children. Special care may be needed. Overdosage: If you think you have taken too much of this medicine contact a poison control center or emergency room at once. NOTE: This medicine is only for you. Do not share this medicine with others. What if I miss a dose? If you miss a dose, take it as soon as you can. If it is almost time for your next dose, take only that dose. Do not take double or extra doses. What may interact with this medicine? -alcohol -aspirin -cidofovir -diuretics -lithium -methotrexate -other drugs for inflammation like ketorolac or prednisone -pemetrexed -probenecid -warfarin This list may not describe all possible interactions. Give  your health care provider a list of all the medicines, herbs, non-prescription drugs, or dietary supplements you use. Also tell them if you smoke, drink alcohol, or use illegal drugs. Some items may interact with your medicine. What should I watch for while using this medicine? Tell your doctor or health care professional if your pain does not get better. Talk to your doctor before taking another medicine for pain. Do not treat yourself. This medicine does not prevent heart attack or stroke. In fact, this medicine may increase the chance of a heart attack or stroke. The chance may increase with longer use of this medicine and in people who have heart disease. If you take aspirin to prevent heart attack or stroke, talk with your doctor or health care professional. Do not take other medicines that contain aspirin, ibuprofen, or naproxen with this medicine. Side effects such as stomach upset, nausea, or ulcers may be more likely to occur. Many medicines available without a prescription should not be taken with this medicine. This medicine can cause ulcers and bleeding in the stomach and intestines at any time during treatment. Do not smoke cigarettes  or drink alcohol. These increase irritation to your stomach and can make it more susceptible to damage from this medicine. Ulcers and bleeding can happen without warning symptoms and can cause death. You may get drowsy or dizzy. Do not drive, use machinery, or do anything that needs mental alertness until you know how this medicine affects you. Do not stand or sit up quickly, especially if you are an older patient. This reduces the risk of dizzy or fainting spells. This medicine can cause you to bleed more easily. Try to avoid damage to your teeth and gums when you brush or floss your teeth. What side effects may I notice from receiving this medicine? Side effects that you should report to your doctor or health care professional as soon as possible: -black or  bloody stools, blood in the urine or vomit -blurred vision -chest pain -difficulty breathing or wheezing -nausea or vomiting -severe stomach pain -skin rash, skin redness, blistering or peeling skin, hives, or itching -slurred speech or weakness on one side of the body -swelling of eyelids, throat, lips -unexplained weight gain or swelling -unusually weak or tired -yellowing of eyes or skin Side effects that usually do not require medical attention (report to your doctor or health care professional if they continue or are bothersome): -constipation -headache -heartburn This list may not describe all possible side effects. Call your doctor for medical advice about side effects. You may report side effects to FDA at 1-800-FDA-1088. Where should I keep my medicine? Keep out of the reach of children. Store at room temperature between 15 and 30 degrees C (59 and 86 degrees F). Keep container tightly closed. Throw away any unused medicine after the expiration date. NOTE: This sheet is a summary. It may not cover all possible information. If you have questions about this medicine, talk to your doctor, pharmacist, or health care provider.  2015, Elsevier/Gold Standard. (2009-07-26 20:10:16)  Potassium Salts tablets, extended-release tablets or capsules What is this medicine? POTASSIUM (poe TASS i um) is a natural salt that is important for the heart, muscles, and nerves. It is found in many foods and is normally supplied by a well balanced diet. This medicine is used to treat low potassium. This medicine may be used for other purposes; ask your health care provider or pharmacist if you have questions. COMMON BRAND NAME(S): ED-K+10, Glu-K, K-10, K-8, K-Dur, K-Tab, Kaon-CL, Klor-Con, Klor-Con M10, Klor-Con M15, Klor-Con M20, Klotrix, Micro-K, Micro-K Extencaps, Slow-K What should I tell my health care provider before I take this medicine? They need to know if you have any of these  conditions: -dehydration -diabetes -irregular heartbeat -kidney disease -stomach ulcers or other stomach problems -an unusual or allergic reaction to potassium salts, other medicines, foods, dyes, or preservatives -pregnant or trying to get pregnant -breast-feeding How should I use this medicine? Take this medicine by mouth with a full glass of water. Follow the directions on the prescription label. Take with food. Do not suck on, crush, or chew this medicine. If you have difficulty swallowing, ask the pharmacist how to take. Take your medicine at regular intervals. Do not take it more often than directed. Do not stop taking except on your doctor's advice. Talk to your pediatrician regarding the use of this medicine in children. Special care may be needed. Overdosage: If you think you have taken too much of this medicine contact a poison control center or emergency room at once. NOTE: This medicine is only for you. Do not share this medicine  with others. What if I miss a dose? If you miss a dose, take it as soon as you can. If it is almost time for your next dose, take only that dose. Do not take double or extra doses. What may interact with this medicine? Do not take this medicine with any of the following medications: -eplerenone -sodium polystyrene sulfonate This medicine may also interact with the following medications: -medicines for blood pressure or heart disease like lisinopril, losartan, quinapril, valsartan -medicines for cold or allergies -medicines for inflammation like ibuprofen, indomethacin -medicines for Parkinson's disease -medicines for the stomach like metoclopramide, dicyclomine, glycopyrrolate -some diuretics This list may not describe all possible interactions. Give your health care provider a list of all the medicines, herbs, non-prescription drugs, or dietary supplements you use. Also tell them if you smoke, drink alcohol, or use illegal drugs. Some items may  interact with your medicine. What should I watch for while using this medicine? Visit your doctor or health care professional for regular check ups. You will need lab work done regularly. You may need to be on a special diet while taking this medicine. Ask your doctor. What side effects may I notice from receiving this medicine? Side effects that you should report to your doctor or health care professional as soon as possible: -allergic reactions like skin rash, itching or hives, swelling of the face, lips, or tongue -black, tarry stools -heartburn -irregular heartbeat -numbness or tingling in hands or feet -pain when swallowing -unusually weak or tired Side effects that usually do not require medical attention (report to your doctor or health care professional if they continue or are bothersome): -diarrhea -nausea -stomach gas -vomiting This list may not describe all possible side effects. Call your doctor for medical advice about side effects. You may report side effects to FDA at 1-800-FDA-1088. Where should I keep my medicine? Keep out of the reach of children. Store at room temperature between 15 and 30 degrees C (59 and 86 degrees F ). Keep bottle closed tightly to protect this medicine from light and moisture. Throw away any unused medicine after the expiration date. NOTE: This sheet is a summary. It may not cover all possible information. If you have questions about this medicine, talk to your doctor, pharmacist, or health care provider.  2015, Elsevier/Gold Standard. (2007-10-09 11:17:31)

## 2014-10-24 NOTE — ED Notes (Signed)
Pt. On cardiac monitor. 

## 2014-10-26 ENCOUNTER — Ambulatory Visit (HOSPITAL_BASED_OUTPATIENT_CLINIC_OR_DEPARTMENT_OTHER)
Admission: RE | Admit: 2014-10-26 | Discharge: 2014-10-26 | Disposition: A | Discharge status: Home / Self Care | Payer: Medicare Other | Source: Ambulatory Visit | Attending: Internal Medicine | Admitting: Internal Medicine

## 2014-10-26 DIAGNOSIS — Z1231 Encounter for screening mammogram for malignant neoplasm of breast: Secondary | ICD-10-CM | POA: Insufficient documentation

## 2014-10-29 DIAGNOSIS — M542 Cervicalgia: Secondary | ICD-10-CM | POA: Diagnosis not present

## 2014-10-29 DIAGNOSIS — M545 Low back pain: Secondary | ICD-10-CM | POA: Diagnosis not present

## 2014-11-13 DIAGNOSIS — N760 Acute vaginitis: Secondary | ICD-10-CM | POA: Diagnosis not present

## 2014-11-13 DIAGNOSIS — E119 Type 2 diabetes mellitus without complications: Secondary | ICD-10-CM | POA: Diagnosis not present

## 2014-11-13 DIAGNOSIS — M542 Cervicalgia: Secondary | ICD-10-CM | POA: Diagnosis not present

## 2014-11-13 DIAGNOSIS — M545 Low back pain: Secondary | ICD-10-CM | POA: Diagnosis not present

## 2014-11-13 DIAGNOSIS — B373 Candidiasis of vulva and vagina: Secondary | ICD-10-CM | POA: Diagnosis not present

## 2014-11-18 DIAGNOSIS — M545 Low back pain: Secondary | ICD-10-CM | POA: Diagnosis not present

## 2014-11-23 DIAGNOSIS — M542 Cervicalgia: Secondary | ICD-10-CM | POA: Diagnosis not present

## 2014-11-23 DIAGNOSIS — M545 Low back pain: Secondary | ICD-10-CM | POA: Diagnosis not present

## 2014-11-25 DIAGNOSIS — M545 Low back pain: Secondary | ICD-10-CM | POA: Diagnosis not present

## 2014-11-25 DIAGNOSIS — M542 Cervicalgia: Secondary | ICD-10-CM | POA: Diagnosis not present

## 2014-11-30 DIAGNOSIS — F329 Major depressive disorder, single episode, unspecified: Secondary | ICD-10-CM | POA: Diagnosis not present

## 2014-11-30 DIAGNOSIS — K219 Gastro-esophageal reflux disease without esophagitis: Secondary | ICD-10-CM | POA: Diagnosis not present

## 2014-11-30 DIAGNOSIS — E119 Type 2 diabetes mellitus without complications: Secondary | ICD-10-CM | POA: Diagnosis not present

## 2014-11-30 DIAGNOSIS — E876 Hypokalemia: Secondary | ICD-10-CM | POA: Diagnosis not present

## 2014-11-30 DIAGNOSIS — E785 Hyperlipidemia, unspecified: Secondary | ICD-10-CM | POA: Diagnosis not present

## 2014-11-30 DIAGNOSIS — I10 Essential (primary) hypertension: Secondary | ICD-10-CM | POA: Diagnosis not present

## 2014-11-30 DIAGNOSIS — M545 Low back pain: Secondary | ICD-10-CM | POA: Diagnosis not present

## 2014-11-30 DIAGNOSIS — E538 Deficiency of other specified B group vitamins: Secondary | ICD-10-CM | POA: Diagnosis not present

## 2014-11-30 DIAGNOSIS — R635 Abnormal weight gain: Secondary | ICD-10-CM | POA: Diagnosis not present

## 2014-11-30 DIAGNOSIS — M542 Cervicalgia: Secondary | ICD-10-CM | POA: Diagnosis not present

## 2014-12-02 DIAGNOSIS — M545 Low back pain: Secondary | ICD-10-CM | POA: Diagnosis not present

## 2014-12-02 DIAGNOSIS — M542 Cervicalgia: Secondary | ICD-10-CM | POA: Diagnosis not present

## 2014-12-07 DIAGNOSIS — M545 Low back pain: Secondary | ICD-10-CM | POA: Diagnosis not present

## 2014-12-07 DIAGNOSIS — M542 Cervicalgia: Secondary | ICD-10-CM | POA: Diagnosis not present

## 2014-12-09 DIAGNOSIS — M542 Cervicalgia: Secondary | ICD-10-CM | POA: Diagnosis not present

## 2014-12-09 DIAGNOSIS — M545 Low back pain: Secondary | ICD-10-CM | POA: Diagnosis not present

## 2014-12-14 DIAGNOSIS — M545 Low back pain: Secondary | ICD-10-CM | POA: Diagnosis not present

## 2014-12-14 DIAGNOSIS — M542 Cervicalgia: Secondary | ICD-10-CM | POA: Diagnosis not present

## 2014-12-16 DIAGNOSIS — G603 Idiopathic progressive neuropathy: Secondary | ICD-10-CM | POA: Diagnosis not present

## 2014-12-16 DIAGNOSIS — G5602 Carpal tunnel syndrome, left upper limb: Secondary | ICD-10-CM | POA: Diagnosis not present

## 2014-12-16 DIAGNOSIS — G44229 Chronic tension-type headache, not intractable: Secondary | ICD-10-CM | POA: Diagnosis not present

## 2014-12-16 DIAGNOSIS — M5416 Radiculopathy, lumbar region: Secondary | ICD-10-CM | POA: Diagnosis not present

## 2014-12-16 DIAGNOSIS — M545 Low back pain: Secondary | ICD-10-CM | POA: Diagnosis not present

## 2014-12-16 DIAGNOSIS — M542 Cervicalgia: Secondary | ICD-10-CM | POA: Diagnosis not present

## 2014-12-23 DIAGNOSIS — M542 Cervicalgia: Secondary | ICD-10-CM | POA: Diagnosis not present

## 2014-12-23 DIAGNOSIS — M545 Low back pain: Secondary | ICD-10-CM | POA: Diagnosis not present

## 2014-12-24 DIAGNOSIS — H31092 Other chorioretinal scars, left eye: Secondary | ICD-10-CM | POA: Diagnosis not present

## 2014-12-24 DIAGNOSIS — E119 Type 2 diabetes mellitus without complications: Secondary | ICD-10-CM | POA: Diagnosis not present

## 2014-12-29 DIAGNOSIS — M545 Low back pain: Secondary | ICD-10-CM | POA: Diagnosis not present

## 2014-12-29 DIAGNOSIS — E876 Hypokalemia: Secondary | ICD-10-CM | POA: Diagnosis not present

## 2014-12-29 DIAGNOSIS — E785 Hyperlipidemia, unspecified: Secondary | ICD-10-CM | POA: Diagnosis not present

## 2014-12-29 DIAGNOSIS — E119 Type 2 diabetes mellitus without complications: Secondary | ICD-10-CM | POA: Diagnosis not present

## 2014-12-29 DIAGNOSIS — B379 Candidiasis, unspecified: Secondary | ICD-10-CM | POA: Diagnosis not present

## 2014-12-29 DIAGNOSIS — E538 Deficiency of other specified B group vitamins: Secondary | ICD-10-CM | POA: Diagnosis not present

## 2014-12-29 DIAGNOSIS — R51 Headache: Secondary | ICD-10-CM | POA: Diagnosis not present

## 2014-12-29 DIAGNOSIS — R635 Abnormal weight gain: Secondary | ICD-10-CM | POA: Diagnosis not present

## 2014-12-29 DIAGNOSIS — K219 Gastro-esophageal reflux disease without esophagitis: Secondary | ICD-10-CM | POA: Diagnosis not present

## 2014-12-29 DIAGNOSIS — Z124 Encounter for screening for malignant neoplasm of cervix: Secondary | ICD-10-CM | POA: Diagnosis not present

## 2014-12-29 DIAGNOSIS — I10 Essential (primary) hypertension: Secondary | ICD-10-CM | POA: Diagnosis not present

## 2014-12-29 DIAGNOSIS — M542 Cervicalgia: Secondary | ICD-10-CM | POA: Diagnosis not present

## 2014-12-31 DIAGNOSIS — M545 Low back pain: Secondary | ICD-10-CM | POA: Diagnosis not present

## 2014-12-31 DIAGNOSIS — M542 Cervicalgia: Secondary | ICD-10-CM | POA: Diagnosis not present

## 2015-01-06 DIAGNOSIS — M542 Cervicalgia: Secondary | ICD-10-CM | POA: Diagnosis not present

## 2015-01-06 DIAGNOSIS — M545 Low back pain: Secondary | ICD-10-CM | POA: Diagnosis not present

## 2015-01-07 DIAGNOSIS — M542 Cervicalgia: Secondary | ICD-10-CM | POA: Diagnosis not present

## 2015-01-07 DIAGNOSIS — M545 Low back pain: Secondary | ICD-10-CM | POA: Diagnosis not present

## 2015-01-11 DIAGNOSIS — M542 Cervicalgia: Secondary | ICD-10-CM | POA: Diagnosis not present

## 2015-01-11 DIAGNOSIS — M545 Low back pain: Secondary | ICD-10-CM | POA: Diagnosis not present

## 2015-01-13 DIAGNOSIS — M545 Low back pain: Secondary | ICD-10-CM | POA: Diagnosis not present

## 2015-01-13 DIAGNOSIS — M542 Cervicalgia: Secondary | ICD-10-CM | POA: Diagnosis not present

## 2015-02-02 DIAGNOSIS — G4733 Obstructive sleep apnea (adult) (pediatric): Secondary | ICD-10-CM | POA: Diagnosis not present

## 2015-02-22 DIAGNOSIS — R140 Abdominal distension (gaseous): Secondary | ICD-10-CM | POA: Diagnosis not present

## 2015-02-22 DIAGNOSIS — E6609 Other obesity due to excess calories: Secondary | ICD-10-CM | POA: Diagnosis not present

## 2015-02-23 ENCOUNTER — Other Ambulatory Visit: Payer: Self-pay | Admitting: Otolaryngology

## 2015-02-23 NOTE — H&P (Signed)
PREOPERATIVE H&P  Chief Complaint: OSA  HPI: Bonnie Gordon is a 59 y.o. female who presents for evaluation of OSA. She was diagnosed with OSA over a year ago with AHI of 11.5. She tried nasal CPAP but doesn't tolerate it well. On exam she has large tonsils and an elongated uvula and soft palate. She appears to be a good candidate for UPPP. She also has enlarged turbinates with relatively straight septum. She will have turbinate reductions to improve the nasal airway.  Past Medical History  Diagnosis Date  . Diabetes mellitus   . Hypertension   . Hypokalemia   . Diverticulitis   . Hypercholesteremia   . Vertigo     patient reported  . Chest pain 2009    echo   Past Surgical History  Procedure Laterality Date  . Cholecystectomy    . Abdominal hysterectomy    . Appendectomy    . Wisdom tooth extraction    . Cardiac catheterization  2009    normal cath  . Cardiac catheterization  2004    normal cath   History   Social History  . Marital Status: Divorced    Spouse Name: N/A  . Number of Children: N/A  . Years of Education: N/A   Social History Main Topics  . Smoking status: Former Games developermoker  . Smokeless tobacco: Never Used  . Alcohol Use: Yes     Comment: occasional / social  . Drug Use: No  . Sexual Activity:    Partners: Male    Birth Control/ Protection: Surgical   Other Topics Concern  . Not on file   Social History Narrative   Family History  Problem Relation Age of Onset  . Diabetes Mother   . Gout Mother   . Hypertension Mother   . Arthritis Mother   . Heart disease Father   . Heart failure Sister   . Arthritis Sister   . Heart disease Maternal Grandmother   . Diabetes Maternal Grandmother   . Heart disease Paternal Grandmother   . Diabetes Paternal Grandmother   . Diabetes Sister   . Bursitis Sister   . Crohn's disease Sister    Allergies  Allergen Reactions  . Asa [Aspirin] Other (See Comments)    Bruises easily  . Latex Itching and Rash    Prior to Admission medications   Medication Sig Start Date End Date Taking? Authorizing Provider  ACCU-CHEK AVIVA PLUS test strip  04/16/14  Yes Historical Provider, MD  ACCU-CHEK SOFTCLIX LANCETS lancets  04/16/14  Yes Historical Provider, MD  amLODipine (NORVASC) 5 MG tablet Take 1 tablet (5 mg total) by mouth daily. 07/08/14  Yes Lennette Biharihomas A Kelly, MD  aspirin EC 81 MG tablet Take 81 mg by mouth daily.   Yes Historical Provider, MD  CRESTOR 20 MG tablet Take 20 mg by mouth daily.  05/06/14  Yes Historical Provider, MD  furosemide (LASIX) 20 MG tablet Take 1 tablet by mouth daily as needed for fluid.  08/28/14  Yes Historical Provider, MD  glimepiride (AMARYL) 4 MG tablet Take 4 mg by mouth daily with breakfast.   Yes Historical Provider, MD  meclizine (ANTIVERT) 50 MG tablet Take 0.5 tablets (25 mg total) by mouth 3 (three) times daily as needed for dizziness. 09/06/14  Yes Doug SouSam Jacubowitz, MD  naproxen (NAPROSYN) 500 MG tablet Take 1 tablet (500 mg total) by mouth 2 (two) times daily. 10/24/14  Yes Dione Boozeavid Glick, MD  phentermine (ADIPEX-P) 37.5 MG tablet Take 37.5 mg by  mouth daily. 02/17/15  Yes Historical Provider, MD  VENTOLIN HFA 108 (90 BASE) MCG/ACT inhaler Inhale 2 puffs into the lungs 4 (four) times daily. 02/12/15  Yes Historical Provider, MD     Positive ROS: per hpi  All other systems have been reviewed and were otherwise negative with the exception of those mentioned in the HPI and as above.  Physical Exam: There were no vitals filed for this visit.  General: Alert, no acute distress Oral: Normal oral mucosa and 2+ tonsils and elongated uvula Nasal: Large turbinates. Straight septum, no polyps. Neck: No palpable adenopathy or thyroid nodules Ear: Ear canal is clear with normal appearing TMs Cardiovascular: Regular rate and rhythm, no murmur.  Respiratory: Clear to auscultation Neurologic: Alert and oriented x 3   Assessment/Plan: TONSILITIS, TURBINATE HYPERTROPHY, OBSTRUCTIVE  SLEEP APNEA Plan for Procedure(s): TONSILLECTOMY/TURBINATE REDUCTION UVULOPALATOPHARYNGOPLASTY (UPPP)   Dillard Cannon, MD 02/23/2015 4:47 PM

## 2015-02-25 ENCOUNTER — Encounter (HOSPITAL_COMMUNITY)
Admission: RE | Admit: 2015-02-25 | Discharge: 2015-02-25 | Disposition: A | Discharge status: Home / Self Care | Payer: Medicare Other | Source: Ambulatory Visit | Attending: Otolaryngology | Admitting: Otolaryngology

## 2015-02-25 ENCOUNTER — Encounter (HOSPITAL_COMMUNITY): Payer: Self-pay

## 2015-02-25 DIAGNOSIS — E1140 Type 2 diabetes mellitus with diabetic neuropathy, unspecified: Secondary | ICD-10-CM | POA: Insufficient documentation

## 2015-02-25 DIAGNOSIS — Z79899 Other long term (current) drug therapy: Secondary | ICD-10-CM | POA: Insufficient documentation

## 2015-02-25 DIAGNOSIS — Z87891 Personal history of nicotine dependence: Secondary | ICD-10-CM | POA: Diagnosis not present

## 2015-02-25 DIAGNOSIS — Z01818 Encounter for other preprocedural examination: Secondary | ICD-10-CM | POA: Diagnosis not present

## 2015-02-25 DIAGNOSIS — Z01812 Encounter for preprocedural laboratory examination: Secondary | ICD-10-CM | POA: Insufficient documentation

## 2015-02-25 DIAGNOSIS — G4733 Obstructive sleep apnea (adult) (pediatric): Secondary | ICD-10-CM | POA: Insufficient documentation

## 2015-02-25 DIAGNOSIS — I10 Essential (primary) hypertension: Secondary | ICD-10-CM | POA: Insufficient documentation

## 2015-02-25 DIAGNOSIS — Z7982 Long term (current) use of aspirin: Secondary | ICD-10-CM | POA: Diagnosis not present

## 2015-02-25 DIAGNOSIS — E780 Pure hypercholesterolemia: Secondary | ICD-10-CM | POA: Insufficient documentation

## 2015-02-25 DIAGNOSIS — K219 Gastro-esophageal reflux disease without esophagitis: Secondary | ICD-10-CM | POA: Diagnosis not present

## 2015-02-25 HISTORY — DX: Type 2 diabetes mellitus with diabetic neuropathy, unspecified: E11.40

## 2015-02-25 HISTORY — DX: Anxiety disorder, unspecified: F41.9

## 2015-02-25 HISTORY — DX: Adverse effect of unspecified anesthetic, initial encounter: T41.45XA

## 2015-02-25 HISTORY — DX: Personal history of other diseases of the respiratory system: Z87.09

## 2015-02-25 HISTORY — DX: Sleep apnea, unspecified: G47.30

## 2015-02-25 HISTORY — DX: Bipolar disorder, unspecified: F31.9

## 2015-02-25 HISTORY — DX: Other complications of anesthesia, initial encounter: T88.59XA

## 2015-02-25 HISTORY — DX: Headache, unspecified: R51.9

## 2015-02-25 HISTORY — DX: Headache: R51

## 2015-02-25 HISTORY — DX: Reserved for inherently not codable concepts without codable children: IMO0001

## 2015-02-25 HISTORY — DX: Major depressive disorder, single episode, unspecified: F32.9

## 2015-02-25 HISTORY — DX: Other specified postprocedural states: R11.2

## 2015-02-25 HISTORY — DX: Depression, unspecified: F32.A

## 2015-02-25 HISTORY — DX: Gastro-esophageal reflux disease without esophagitis: K21.9

## 2015-02-25 HISTORY — DX: Other specified postprocedural states: Z98.890

## 2015-02-25 LAB — CBC
HCT: 40.1 % (ref 36.0–46.0)
Hemoglobin: 13.6 g/dL (ref 12.0–15.0)
MCH: 28.1 pg (ref 26.0–34.0)
MCHC: 33.9 g/dL (ref 30.0–36.0)
MCV: 82.9 fL (ref 78.0–100.0)
Platelets: 195 10*3/uL (ref 150–400)
RBC: 4.84 MIL/uL (ref 3.87–5.11)
RDW: 13.4 % (ref 11.5–15.5)
WBC: 5.5 10*3/uL (ref 4.0–10.5)

## 2015-02-25 LAB — BASIC METABOLIC PANEL
Anion gap: 8 (ref 5–15)
BUN: 7 mg/dL (ref 6–20)
CO2: 30 mmol/L (ref 22–32)
Calcium: 9.3 mg/dL (ref 8.9–10.3)
Chloride: 101 mmol/L (ref 101–111)
Creatinine, Ser: 0.7 mg/dL (ref 0.44–1.00)
GFR calc Af Amer: >60 mL/min (ref >60)
GFR calc non Af Amer: >60 mL/min (ref >60)
Glucose, Bld: 122 mg/dL — ABNORMAL HIGH (ref 65–99)
Potassium: 3.5 mmol/L (ref 3.5–5.1)
Sodium: 139 mmol/L (ref 135–145)

## 2015-02-25 LAB — GLUCOSE, CAPILLARY: Glucose-Capillary: 124 mg/dL — ABNORMAL HIGH (ref 65–99)

## 2015-02-25 NOTE — Pre-Procedure Instructions (Addendum)
    Bonnie Gordon  02/25/2015      [id number] [place], [place] - [phone] AT [facility] [address] [place] [address] Phone: [phone] Fax: [phone]    Your procedure is scheduled on Monday, July 25th, 2016 at 9:00 AM.  Report to [facility] Admitting at 7:00 A.M.  Call this number if you have problems the morning of surgery:  [phone]   Remember:  Do not eat food or drink liquids after midnight.   Take these medicines the morning of surgery with A SIP OF WATER: Amlodipine (Norvasc), Ventolin HFA inhaler,  DO NOT take Glimepiride (Amaryl) the morning of surgery.    Stop taking: Meloxicam (Mobic), Phentermine (Adipex-p), Naproxen, Aleve, Aspirin, Ibuprofen, Advil, BC's, Goody's, fish oil, all herbal medications, and all vitamins.   Do not wear jewelry, make-up or nail polish.  Do not wear lotions, powders, or perfumes.  You may wear deodorant.  Do not shave 48 hours prior to surgery.    Do not bring valuables to the hospital.  [facility] is not responsible for any belongings or valuables.  Contacts, dentures or bridgework may not be worn into surgery.  Leave your suitcase in the car.  After surgery it may be brought to your room.  For patients admitted to the hospital, discharge time will be determined by your treatment team.  Patients discharged the day of surgery will not be allowed to drive home.   Special instructions:  See attached.   Please read over the following fact sheets that you were given. Pain Booklet, Coughing and Deep Breathing and Surgical Site Infection Prevention

## 2015-02-25 NOTE — Progress Notes (Signed)
PCP- Dr. Jackie Plum at [facility]  Cardiologist- Dr. Tresa Endo  EKG - 10/24/14 - Epic CXR - denies  Echo- 08/12/14 - Epic Stress test - 08/12/14- Epic Cardiac Cath - 2004 & 2009 - Epic   Pt. States that she does have sleep apnea but does not wear a CPAP at night. [facility] in [place].

## 2015-02-26 LAB — HEMOGLOBIN A1C
Hgb A1c MFr Bld: 7.1 % — ABNORMAL HIGH (ref 4.8–5.6)
Mean Plasma Glucose: 157 mg/dL

## 2015-02-26 NOTE — Progress Notes (Signed)
Anesthesia Chart Review: Patient is a 59 year old female scheduled for UPPP with tonsillectomy, bilateral turbinate reductions on 03/01/15 by Dr. Dillard Cannon.   History includes former smoker, post-operative N/V, HTN, OSA without CPAP, hypercholesterolemia, vertigo, chest pain (non-ischemic test 08/2014), prolonged emergence, diabetes mellitus type 2 with neuropathy, SOB, anxiety, depression, bipolar disorder, GERD, hysterectomy, cholecystectomy, appendectomy. PCP is Dr. Greggory Stallion Osei-Bonsu.  Meds include Elavil, amlodipine, ASA, Crestor, Flexeril, Amaryl, Antivert, Mobic, Lyrica, Phenergan, Ventolin.  Cardiologist is Dr. Tresa Endo, last visit 09/02/14 with one year follow-up recommended.   08/12/14 Echo: Study Conclusions - Left ventricle: The cavity size was normal. There was mild focal basal hypertrophy of the septum. Systolic function was normal. The estimated ejection fraction was in the range of 55% to 60%. Wall motion was normal; there were no regional wall motion abnormalities. - Atrial septum: No defect or patent foramen ovale was identified. Echo contrast study showed no right-to-left atrial level shunt, at baseline or with provocation.  08/12/14 Nuclear stress test: Overall Impression: Normal stress nuclear study. LV Wall Motion: NL LV Function; NL Wall Motion. LVEF 58%.  03/24/08 LHC:  HEMODYNAMIC RESULTS: 1. Aortic systolic pressure 152, end-diastolic pressure 88. 2. Left ventricular systolic pressure 149, end-diastolic pressure 15. SELECTIVE CORONARY ANGIOGRAPHY: 1. Left main normal. 2. LAD; LAD had approximately 30% smooth proximal stenosis. 3. Circumflex; nondominant normal. 4. Ramus branch; moderate in size and normal. 5. Right coronary artery; dominant with a 30% stenosis in the  midportion after acute marginal branch. 6. Left ventriculography; RAO left ventriculogram was performed using  25 mL of Visipaque dye at 12 mL per second. The  overall LVEF was  estimated to be greater than 60% without focal wall motion  abnormalities. IMPRESSION: Ms. Jennette Kettle has noncritical coronary artery disease with normal left ventricular function. I believe her chest pain is noncardiac. Medical therapy will be recommended. She may need a gastrointestinal evaluation. (Dr. 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  Preoperative labs noted. A1C 7.1.   If no acute changes then I anticipate that she can proceed as planned.  Velna Ochs [facility]/Anesthesiology Phone [phone] 02/26/2015 10:40 AM

## 2015-03-01 ENCOUNTER — Encounter (HOSPITAL_COMMUNITY): Payer: Self-pay | Admitting: Anesthesiology

## 2015-03-01 ENCOUNTER — Ambulatory Visit (HOSPITAL_COMMUNITY): Payer: Medicare Other | Admitting: Vascular Surgery

## 2015-03-01 ENCOUNTER — Ambulatory Visit (HOSPITAL_COMMUNITY): Payer: Medicare Other | Admitting: Anesthesiology

## 2015-03-01 ENCOUNTER — Encounter (HOSPITAL_COMMUNITY)
Admission: RE | Disposition: A | Discharge status: Home / Self Care | Payer: Self-pay | Source: Ambulatory Visit | Attending: Otolaryngology

## 2015-03-01 ENCOUNTER — Observation Stay (HOSPITAL_COMMUNITY)
Admission: RE | Admit: 2015-03-01 | Discharge: 2015-03-02 | Disposition: A | Discharge status: Home / Self Care | Payer: Medicare Other | Source: Ambulatory Visit | Attending: Otolaryngology | Admitting: Otolaryngology

## 2015-03-01 DIAGNOSIS — R079 Chest pain, unspecified: Secondary | ICD-10-CM | POA: Diagnosis not present

## 2015-03-01 DIAGNOSIS — G4733 Obstructive sleep apnea (adult) (pediatric): Secondary | ICD-10-CM | POA: Insufficient documentation

## 2015-03-01 DIAGNOSIS — I4510 Unspecified right bundle-branch block: Secondary | ICD-10-CM | POA: Diagnosis not present

## 2015-03-01 DIAGNOSIS — E119 Type 2 diabetes mellitus without complications: Secondary | ICD-10-CM | POA: Insufficient documentation

## 2015-03-01 DIAGNOSIS — F319 Bipolar disorder, unspecified: Secondary | ICD-10-CM | POA: Diagnosis not present

## 2015-03-01 DIAGNOSIS — J3501 Chronic tonsillitis: Secondary | ICD-10-CM | POA: Diagnosis not present

## 2015-03-01 DIAGNOSIS — Z87891 Personal history of nicotine dependence: Secondary | ICD-10-CM | POA: Insufficient documentation

## 2015-03-01 DIAGNOSIS — J343 Hypertrophy of nasal turbinates: Principal | ICD-10-CM | POA: Insufficient documentation

## 2015-03-01 DIAGNOSIS — K219 Gastro-esophageal reflux disease without esophagitis: Secondary | ICD-10-CM | POA: Diagnosis not present

## 2015-03-01 DIAGNOSIS — J039 Acute tonsillitis, unspecified: Secondary | ICD-10-CM | POA: Diagnosis not present

## 2015-03-01 DIAGNOSIS — G4730 Sleep apnea, unspecified: Secondary | ICD-10-CM | POA: Diagnosis not present

## 2015-03-01 DIAGNOSIS — I10 Essential (primary) hypertension: Secondary | ICD-10-CM | POA: Diagnosis not present

## 2015-03-01 HISTORY — PX: UVULOPALATOPHARYNGOPLASTY: SHX827

## 2015-03-01 HISTORY — PX: TONSILLECTOMY/ADENOIDECTOMY/TURBINATE REDUCTION: SHX6126

## 2015-03-01 LAB — GLUCOSE, CAPILLARY
Glucose-Capillary: 131 mg/dL — ABNORMAL HIGH (ref 65–99)
Glucose-Capillary: 161 mg/dL — ABNORMAL HIGH (ref 65–99)
Glucose-Capillary: 175 mg/dL — ABNORMAL HIGH (ref 65–99)
Glucose-Capillary: 152 mg/dL — ABNORMAL HIGH (ref 65–99)
Glucose-Capillary: 167 mg/dL — ABNORMAL HIGH (ref 65–99)

## 2015-03-01 LAB — NO BLOOD PRODUCTS

## 2015-03-01 SURGERY — TONSILLECTOMY AND ADENOIDECTOMY, WITH NASAL TURBINATE PARTIAL EXCISION
Anesthesia: General | Site: Throat | Laterality: Bilateral

## 2015-03-01 MED ORDER — LIDOCAINE HCL (CARDIAC) 20 MG/ML IV SOLN
INTRAVENOUS | Status: AC
  Administered 2015-03-01: 40 mg via INTRAVENOUS
  Filled 2015-03-01: qty 5

## 2015-03-01 MED ORDER — LIDOCAINE HCL 4 % MT SOLN
OROMUCOSAL | Status: DC | PRN
Start: 2015-03-01 — End: 2015-03-01
  Administered 2015-03-01: 4 mL via TOPICAL

## 2015-03-01 MED ORDER — OXYMETAZOLINE HCL 0.05 % NA SOLN
NASAL | Status: DC | PRN
  Administered 2015-03-01: 1

## 2015-03-01 MED ORDER — PROPOFOL 10 MG/ML IV BOLUS
INTRAVENOUS | Status: AC
  Filled 2015-03-01: qty 20

## 2015-03-01 MED ORDER — LIDOCAINE HCL (CARDIAC) 20 MG/ML IV SOLN
INTRAVENOUS | Status: DC | PRN
  Administered 2015-03-01: 80 mg via INTRAVENOUS
  Administered 2015-03-01: 60 mg via INTRAVENOUS

## 2015-03-01 MED ORDER — 0.9 % SODIUM CHLORIDE (POUR BTL) OPTIME
TOPICAL | Status: DC | PRN
  Administered 2015-03-01: 1000 mL

## 2015-03-01 MED ORDER — ONDANSETRON HCL 4 MG/2ML IJ SOLN: 4.0000 mg | INTRAMUSCULAR | Status: DC | PRN

## 2015-03-01 MED ORDER — EPHEDRINE SULFATE 50 MG/ML IJ SOLN
INTRAMUSCULAR | Status: AC
  Filled 2015-03-01: qty 1

## 2015-03-01 MED ORDER — PROPOFOL 10 MG/ML IV BOLUS
INTRAVENOUS | Status: DC | PRN
Start: 2015-03-01 — End: 2015-03-01
  Administered 2015-03-01: 200 mg via INTRAVENOUS

## 2015-03-01 MED ORDER — AMLODIPINE BESYLATE 5 MG PO TABS
5.0000 mg | ORAL_TABLET | Freq: Every day | ORAL | Status: DC
  Filled 2015-03-01: qty 1

## 2015-03-01 MED ORDER — PHENYLEPHRINE 40 MCG/ML (10ML) SYRINGE FOR IV PUSH (FOR BLOOD PRESSURE SUPPORT)
PREFILLED_SYRINGE | INTRAVENOUS | Status: AC
  Filled 2015-03-01: qty 10

## 2015-03-01 MED ORDER — ROCURONIUM BROMIDE 50 MG/5ML IV SOLN
INTRAVENOUS | Status: AC
  Filled 2015-03-01: qty 1

## 2015-03-01 MED ORDER — ROCURONIUM BROMIDE 100 MG/10ML IV SOLN
INTRAVENOUS | Status: DC | PRN
  Administered 2015-03-01: 20 mg via INTRAVENOUS

## 2015-03-01 MED ORDER — LIDOCAINE HCL (CARDIAC) 20 MG/ML IV SOLN
INTRAVENOUS | Status: AC
  Filled 2015-03-01: qty 5

## 2015-03-01 MED ORDER — GLYCOPYRROLATE 0.2 MG/ML IJ SOLN
INTRAMUSCULAR | Status: DC | PRN
  Administered 2015-03-01: 0.4 mg via INTRAVENOUS

## 2015-03-01 MED ORDER — NEOSTIGMINE METHYLSULFATE 10 MG/10ML IV SOLN
INTRAVENOUS | Status: DC | PRN
  Administered 2015-03-01: 3 mg via INTRAVENOUS

## 2015-03-01 MED ORDER — GLIMEPIRIDE 4 MG PO TABS
4.0000 mg | ORAL_TABLET | Freq: Every day | ORAL | Status: DC
  Filled 2015-03-01 (×2): qty 1

## 2015-03-01 MED ORDER — AMITRIPTYLINE HCL 10 MG PO TABS
10.0000 mg | ORAL_TABLET | Freq: Every evening | ORAL | Status: DC | PRN
  Filled 2015-03-01: qty 1

## 2015-03-01 MED ORDER — CEFAZOLIN SODIUM-DEXTROSE 2-3 GM-% IV SOLR
2.0000 g | INTRAVENOUS | Status: AC
  Administered 2015-03-01: 2 g via INTRAVENOUS
  Filled 2015-03-01: qty 50

## 2015-03-01 MED ORDER — MIDAZOLAM HCL 2 MG/2ML IJ SOLN
INTRAMUSCULAR | Status: AC
  Filled 2015-03-01: qty 2

## 2015-03-01 MED ORDER — SODIUM CHLORIDE 0.9 % IR SOLN
Status: DC | PRN
  Administered 2015-03-01: 1000 mL

## 2015-03-01 MED ORDER — OXYMETAZOLINE HCL 0.05 % NA SOLN
NASAL | Status: AC
  Filled 2015-03-01: qty 15

## 2015-03-01 MED ORDER — ACETAMINOPHEN 160 MG/5ML PO SOLN: 650.0000 mg | ORAL | Status: DC | PRN

## 2015-03-01 MED ORDER — LACTATED RINGERS IV SOLN
INTRAVENOUS | Status: DC
  Administered 2015-03-01: 08:00:00 via INTRAVENOUS

## 2015-03-01 MED ORDER — CEFAZOLIN SODIUM 1-5 GM-% IV SOLN
1.0000 g | Freq: Three times a day (TID) | INTRAVENOUS | Status: DC
  Administered 2015-03-01 – 2015-03-02 (×3): 1 g via INTRAVENOUS
  Filled 2015-03-01 (×8): qty 50

## 2015-03-01 MED ORDER — PHENYLEPHRINE HCL 10 MG/ML IJ SOLN
INTRAMUSCULAR | Status: DC | PRN
  Administered 2015-03-01 (×4): 80 ug via INTRAVENOUS

## 2015-03-01 MED ORDER — ONDANSETRON HCL 4 MG/2ML IJ SOLN
INTRAMUSCULAR | Status: AC
  Filled 2015-03-01: qty 2

## 2015-03-01 MED ORDER — ONDANSETRON HCL 4 MG PO TABS: 4.0000 mg | ORAL_TABLET | ORAL | Status: DC | PRN

## 2015-03-01 MED ORDER — MIDAZOLAM HCL 5 MG/5ML IJ SOLN
INTRAMUSCULAR | Status: DC | PRN
  Administered 2015-03-01: 1 mg via INTRAVENOUS

## 2015-03-01 MED ORDER — CYCLOBENZAPRINE HCL 10 MG PO TABS
10.0000 mg | ORAL_TABLET | Freq: Every day | ORAL | Status: DC
  Administered 2015-03-01: 10 mg via ORAL
  Filled 2015-03-01 (×2): qty 1

## 2015-03-01 MED ORDER — SODIUM CHLORIDE 0.9 % IJ SOLN
INTRAMUSCULAR | Status: AC
  Filled 2015-03-01: qty 10

## 2015-03-01 MED ORDER — ACETAMINOPHEN 650 MG RE SUPP: 650.0000 mg | RECTAL | Status: DC | PRN

## 2015-03-01 MED ORDER — HYDROCODONE-ACETAMINOPHEN 7.5-325 MG/15ML PO SOLN
ORAL | Status: AC
  Filled 2015-03-01: qty 15

## 2015-03-01 MED ORDER — DEXAMETHASONE SODIUM PHOSPHATE 10 MG/ML IJ SOLN
INTRAMUSCULAR | Status: DC | PRN
  Administered 2015-03-01: 10 mg via INTRAVENOUS

## 2015-03-01 MED ORDER — MORPHINE SULFATE 2 MG/ML IJ SOLN
2.0000 mg | INTRAMUSCULAR | Status: DC | PRN
  Administered 2015-03-01 – 2015-03-02 (×2): 2 mg via INTRAVENOUS
  Filled 2015-03-01 (×2): qty 1

## 2015-03-01 MED ORDER — FENTANYL CITRATE (PF) 250 MCG/5ML IJ SOLN
INTRAMUSCULAR | Status: AC
  Filled 2015-03-01: qty 5

## 2015-03-01 MED ORDER — LACTATED RINGERS IV SOLN
INTRAVENOUS | Status: DC | PRN
  Administered 2015-03-01: 09:00:00 via INTRAVENOUS

## 2015-03-01 MED ORDER — LIDOCAINE-EPINEPHRINE 1 %-1:100000 IJ SOLN
INTRAMUSCULAR | Status: DC | PRN
  Administered 2015-03-01: 20 mL

## 2015-03-01 MED ORDER — ONDANSETRON HCL 4 MG/2ML IJ SOLN
4.0000 mg | Freq: Once | INTRAMUSCULAR | Status: AC | PRN
  Administered 2015-03-01: 4 mg via INTRAVENOUS

## 2015-03-01 MED ORDER — LIDOCAINE-EPINEPHRINE 1 %-1:100000 IJ SOLN
INTRAMUSCULAR | Status: AC
  Filled 2015-03-01: qty 1

## 2015-03-01 MED ORDER — LIDOCAINE HCL (CARDIAC) 20 MG/ML IV SOLN
40.0000 mg | Freq: Once | INTRAVENOUS | Status: AC
  Administered 2015-03-01: 40 mg via INTRAVENOUS

## 2015-03-01 MED ORDER — HYDROCODONE-ACETAMINOPHEN 7.5-325 MG/15ML PO SOLN
10.0000 mL | ORAL | Status: DC | PRN
  Administered 2015-03-01 (×2): 15 mL via ORAL

## 2015-03-01 MED ORDER — FENTANYL CITRATE (PF) 100 MCG/2ML IJ SOLN
INTRAMUSCULAR | Status: DC | PRN
  Administered 2015-03-01 (×4): 50 ug via INTRAVENOUS

## 2015-03-01 MED ORDER — GLYCOPYRROLATE 0.2 MG/ML IJ SOLN
INTRAMUSCULAR | Status: AC
  Filled 2015-03-01: qty 2

## 2015-03-01 MED ORDER — PHENOL 1.4 % MT LIQD
1.0000 | OROMUCOSAL | Status: DC | PRN
  Filled 2015-03-01: qty 177

## 2015-03-01 MED ORDER — PROMETHAZINE HCL 25 MG PO TABS: 25.0000 mg | ORAL_TABLET | Freq: Four times a day (QID) | ORAL | Status: DC | PRN

## 2015-03-01 MED ORDER — DEXAMETHASONE SODIUM PHOSPHATE 10 MG/ML IJ SOLN
INTRAMUSCULAR | Status: AC
  Filled 2015-03-01: qty 1

## 2015-03-01 MED ORDER — ALBUTEROL SULFATE (2.5 MG/3ML) 0.083% IN NEBU
2.5000 mg | INHALATION_SOLUTION | Freq: Four times a day (QID) | RESPIRATORY_TRACT | Status: DC
  Administered 2015-03-01 – 2015-03-02 (×2): 2.5 mg via RESPIRATORY_TRACT
  Filled 2015-03-01 (×2): qty 3

## 2015-03-01 MED ORDER — PREGABALIN 100 MG PO CAPS
100.0000 mg | ORAL_CAPSULE | Freq: Three times a day (TID) | ORAL | Status: DC
  Administered 2015-03-01: 100 mg via ORAL
  Filled 2015-03-01: qty 1

## 2015-03-01 MED ORDER — NEOSTIGMINE METHYLSULFATE 10 MG/10ML IV SOLN
INTRAVENOUS | Status: AC
  Filled 2015-03-01: qty 1

## 2015-03-01 SURGICAL SUPPLY — 41 items
BLADE 10 SAFETY STRL DISP (BLADE) ×2 IMPLANT
BLADE INF TURB ROT M4 2 5PK (BLADE) ×2 IMPLANT
BLADE SURG 15 STRL LF DISP TIS (BLADE) ×1 IMPLANT
BLADE SURG 15 STRL SS (BLADE) ×1
CANISTER SUCTION 2500CC (MISCELLANEOUS) ×2 IMPLANT
CLEANER TIP ELECTROSURG 2X2 (MISCELLANEOUS) ×2 IMPLANT
COAGULATOR SUCT 8FR VV (MISCELLANEOUS) IMPLANT
DRAPE PROXIMA HALF (DRAPES) ×2 IMPLANT
DRESSING TELFA 8X3 (GAUZE/BANDAGES/DRESSINGS) IMPLANT
ELECT COATED BLADE 2.86 ST (ELECTRODE) ×2 IMPLANT
ELECT REM PT RETURN 9FT ADLT (ELECTROSURGICAL) ×2
ELECTRODE REM PT RTRN 9FT ADLT (ELECTROSURGICAL) ×1 IMPLANT
GLOVE BIOGEL PI IND STRL 6.5 (GLOVE) ×1 IMPLANT
GLOVE BIOGEL PI INDICATOR 6.5 (GLOVE) ×1
GLOVE SS BIOGEL STRL SZ 7.5 (GLOVE) ×4 IMPLANT
GLOVE SUPERSENSE BIOGEL SZ 7.5 (GLOVE) ×4
GLOVE SURG SS PI 6.5 STRL IVOR (GLOVE) ×2 IMPLANT
GLOVE SURG SS PI 7.5 STRL IVOR (GLOVE) ×2 IMPLANT
GOWN STRL REUS W/ TWL LRG LVL3 (GOWN DISPOSABLE) ×1 IMPLANT
GOWN STRL REUS W/ TWL XL LVL3 (GOWN DISPOSABLE) ×1 IMPLANT
GOWN STRL REUS W/TWL LRG LVL3 (GOWN DISPOSABLE) ×1
GOWN STRL REUS W/TWL XL LVL3 (GOWN DISPOSABLE) ×1
KIT BASIN OR (CUSTOM PROCEDURE TRAY) ×2 IMPLANT
KIT ROOM TURNOVER OR (KITS) ×2 IMPLANT
NEEDLE HYPO 25GX1X1/2 BEV (NEEDLE) IMPLANT
NEEDLE HYPO 25X1 1.5 SAFETY (NEEDLE) ×2 IMPLANT
NS IRRIG 1000ML POUR BTL (IV SOLUTION) ×2 IMPLANT
PAD ARMBOARD 7.5X6 YLW CONV (MISCELLANEOUS) ×4 IMPLANT
PATTIES SURGICAL .5 X3 (DISPOSABLE) ×2 IMPLANT
PENCIL FOOT CONTROL (ELECTRODE) ×2 IMPLANT
SPECIMEN JAR SMALL (MISCELLANEOUS) ×2 IMPLANT
SPONGE INTESTINAL PEANUT (DISPOSABLE) ×2 IMPLANT
SPONGE TONSIL 1 RF SGL (DISPOSABLE) ×2 IMPLANT
STRIP CLOSURE SKIN 1/2X4 (GAUZE/BANDAGES/DRESSINGS) IMPLANT
SUT CHROMIC 3 0 PS 2 (SUTURE) ×2 IMPLANT
SUT VIC AB 3-0 SH 27 (SUTURE) ×1
SUT VIC AB 3-0 SH 27XBRD (SUTURE) ×1 IMPLANT
SYR 3ML 25GX5/8 SAFETY (SYRINGE) ×2 IMPLANT
TOWEL OR 17X24 6PK STRL BLUE (TOWEL DISPOSABLE) ×2 IMPLANT
TRAY ENT MC OR (CUSTOM PROCEDURE TRAY) ×2 IMPLANT
WATER STERILE IRR 1000ML POUR (IV SOLUTION) ×2 IMPLANT

## 2015-03-01 NOTE — Anesthesia Procedure Notes (Signed)
Procedure Name: Intubation Date/Time: 03/01/2015 9:08 AM Performed by: Ferol Luz L Pre-anesthesia Checklist: Patient identified, Emergency Drugs available, Suction available, Patient being monitored and Timeout performed Patient Re-evaluated:Patient Re-evaluated prior to inductionOxygen Delivery Method: Circle system utilized Preoxygenation: Pre-oxygenation with 100% oxygen Intubation Type: IV induction Ventilation: Mask ventilation with difficulty and Oral airway inserted - appropriate to patient size Laryngoscope Size: Mac and 3 Grade View: Grade I Tube size: 7.0 mm Number of attempts: 1 Airway Equipment and Method: Stylet Placement Confirmation: ETT inserted through vocal cords under direct vision,  breath sounds checked- equal and bilateral and positive ETCO2 Secured at: 24 cm Tube secured with: Tape (mark only ) Dental Injury: Teeth and Oropharynx as per pre-operative assessment  Comments: Easy mask c opa

## 2015-03-01 NOTE — Brief Op Note (Signed)
03/01/2015  10:15 AM  PATIENT:  Bonnie Gordon  59 y.o. female  PRE-OPERATIVE DIAGNOSIS:  TONSILITIS, TURBINATE HYPERTROPHY, OBSTRUCTIVE SLEEP APNEA  POST-OPERATIVE DIAGNOSIS:  TONSILITIS, TURBINATE HYPERTROPHY, OBSTRUCTIVE SLEEP APNEA  PROCEDURE:  Procedure(s): TONSILLECTOMY/TURBINATE REDUCTION (Bilateral) UVULOPALATOPHARYNGOPLASTY (UPPP) (Bilateral)  SURGEON:  Surgeon(s) and Role:    * Drema Halon, MD - Primary  PHYSICIAN ASSISTANT:   ASSISTANTS: none   ANESTHESIA:   general  EBL:     BLOOD ADMINISTERED:none  DRAINS: none   LOCAL MEDICATIONS USED:  NONE  SPECIMEN:  No Specimen  DISPOSITION OF SPECIMEN:  N/A  COUNTS:  YES  TOURNIQUET:  * No tourniquets in log *  DICTATION: .Other Dictation: Dictation Number [id number]  PLAN OF CARE: Admit for overnight observation  PATIENT DISPOSITION:  PACU - hemodynamically stable.   Delay start of Pharmacological VTE agent (>24hrs) due to surgical blood loss or risk of bleeding: yes

## 2015-03-01 NOTE — Progress Notes (Signed)
Pt states she does not want me to order tray for her at this time

## 2015-03-01 NOTE — Progress Notes (Signed)
Pt arrived to pacu with new morphology in rhythm and new bigeminy. Kathlene November CRNA at bedside and Dr.Smith made aware--Lidocaine ordered and given by [facility] CRNA along with 12 lead EKG ordered

## 2015-03-01 NOTE — Consult Note (Signed)
Cardiologist: Tresa Endo  Reason for Consult: Chest Pain Referring Physician:   [name] is an 59 y.o. female.  HPI:   Bonnie Gordon is a 59 y.o. female with a history which is listed below.  She was initially seen by Dr. Tresa Endo in 2004 after she had experienced chest pressure and was evaluated at [facility] emergency room. A nuclear perfusion study raised the possibility of anterior to inferolateral wall ischemia. 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 She was treated aggressively with statin therapy in attempt to induce plaque or aggression.  She has underwent a second heart catheterization which was done by Dr. Allyson Sabal in 2009 which showed 30% LAD and RCA stenoses with a normal nondominant circumflex and with continued normal LV function.  She developed diabetes mellitus over the last 3 years. She is on medication for hypertension as well as GERD, hyperlipidemia, asthma and peripheral neuropathy. She also was diagnosed with sleep apnea and is using CPAP therapy.  Back in Dec 2015 she reported episodes of chest discomfort. Oftentimes these are sharp and short-lived. However, other times she does note some difficulty swallowing. She has experienced episodes of increasing shortness of breath with activity and also associated with right arm discomfort. She underwent a 2-D echo Doppler study on 08/12/2014 which showed normal systolic function without regional wall motion abnormalities. A nuclear perfusion study revealed an ejection fraction of 58% with normal perfusion and function. There were no ECG changes on the study.  Her lipids were excellent with a total cholesterol 151, triglycerides 97, HDL 52, LDL 80. Hemoglobin A1c was increased at 7.0.  She presented for surgery and underwent  TONSILLECTOMY/TURBINATE REDUCTION (Bilateral) UVULOPALATOPHARYNGOPLASTY (UPPP) (Bilateral).  The patient woke after surgery and was complaining of SOB and Sharp, needle-like CP.  She says her right arm pain typically coincides with the CP which is also exacerbated with a deep breath.  These are chronic issues for the last ten years.  She sleeps elevated due to SOB.  Last week she had some left ankle edema.  She has lost about 10 pounds intentionally.  The patient currently denies nausea, vomiting, fever, PND, cough, congestion, hematochezia, melena, claudication.  She saw Dr. Elnoria Howard for RLQ pain and also reports intermittent dizziness.    Past Medical History  Diagnosis Date  . Hypertension   . Hypokalemia   . Diverticulitis   . Hypercholesteremia   . Vertigo     patient reported  . Chest pain 2009    echo  . Sleep apnea   . Complication of anesthesia     pt. states she is difficult to wake up  . PONV (postoperative nausea and vomiting)   . Diabetes mellitus     Type II  . History of bronchitis   . Shortness of breath dyspnea   . Anxiety   . Depression   . Bipolar disorder   . GERD (gastroesophageal reflux disease)   . Headache   . Diabetic neuropathy     Past Surgical History  Procedure Laterality Date  . Cholecystectomy    . Abdominal hysterectomy    . Appendectomy    . Wisdom tooth extraction    . Cardiac catheterization  2009    normal cath  . Cardiac catheterization  2004    normal cath    Family History  Problem Relation Age  of Onset  . Diabetes Mother   . Gout Mother   . Hypertension Mother   . Arthritis Mother   . Heart disease Father   . Heart failure Sister   . Arthritis Sister   . Heart disease Maternal Grandmother   . Diabetes Maternal Grandmother   . Heart disease Paternal Grandmother   . Diabetes Paternal Grandmother   . Diabetes Sister   . Bursitis Sister   . Crohn's disease Sister     Social History:  reports that she quit smoking about 32  years ago. She has never used smokeless tobacco. She reports that she drinks alcohol. She reports that she does not use illicit drugs.  Allergies:  Allergies  Allergen Reactions  . Other     No Blood Products  . Asa [Aspirin] Other (See Comments)    Bruises easily  . Latex Itching and Rash    Medications:  Medication Sig  amLODipine (NORVASC) 5 MG tablet Take 1 tablet (5 mg total) by mouth daily.  aspirin EC 81 MG tablet Take 81 mg by mouth daily.  CRESTOR 20 MG tablet Take 20 mg by mouth daily.   glimepiride (AMARYL) 4 MG tablet Take 4 mg by mouth daily with breakfast.  meclizine (ANTIVERT) 50 MG tablet Take 0.5 tablets (25 mg total) by mouth 3 (three) times daily as needed for dizziness.  meloxicam (MOBIC) 7.5 MG tablet Take 7.5 mg by mouth daily.  Multiple Vitamins-Minerals (MULTIVITAMIN WITH MINERALS) tablet Take 1 tablet by mouth daily.  pregabalin (LYRICA) 100 MG capsule Take 100 mg by mouth 3 (three) times daily.  VENTOLIN HFA 108 (90 BASE) MCG/ACT inhaler Inhale 2 puffs into the lungs 4 (four) times daily.  ACCU-CHEK AVIVA PLUS test strip   ACCU-CHEK SOFTCLIX LANCETS lancets   amitriptyline (ELAVIL) 10 MG tablet Take 10 mg by mouth at bedtime as needed for sleep.  cyclobenzaprine (FLEXERIL) 10 MG tablet Take 10 mg by mouth at bedtime.  promethazine (PHENERGAN) 25 MG tablet Take 25 mg by mouth every 6 (six) hours as needed for nausea or vomiting.     Results for orders placed or performed during the hospital encounter of 03/01/15 (from the past 48 hour(s))  Glucose, capillary     Status: Abnormal   Collection Time: 03/01/15  7:22 AM  Result Value Ref Range   Glucose-Capillary 131 (H) 65 - 99 mg/dL  No blood products     Status: None   Collection Time: 03/01/15  9:40 AM  Result Value Ref Range   Transfuse no blood products      TRANSFUSE NO BLOOD PRODUCTS, VERIFIED BY  FORTE,LINDSI RN  Glucose, capillary     Status: Abnormal   Collection Time: 03/01/15 10:33 AM    Result Value Ref Range   Glucose-Capillary 161 (H) 65 - 99 mg/dL   Comment 1 Notify RN   Glucose, capillary     Status: Abnormal   Collection Time: 03/01/15  3:22 PM  Result Value Ref Range   Glucose-Capillary 175 (H) 65 - 99 mg/dL   Comment 1 Notify RN   Glucose, capillary     Status: Abnormal   Collection Time: 03/01/15  5:49 PM  Result Value Ref Range   Glucose-Capillary 152 (H) 65 - 99 mg/dL   Comment 1 Notify RN     No results found.  Review of Systems  Constitutional: Negative for fever and diaphoresis.  HENT: Negative for congestion.   Respiratory: Positive for shortness of breath. Negative for cough.  Cardiovascular: Positive for chest pain, orthopnea and leg swelling (Last week in left ankle). Negative for palpitations and PND.  Gastrointestinal: Positive for abdominal pain (Right LQ). Negative for nausea, vomiting, blood in stool and melena.  Genitourinary: Flank pain: Right arm.  Musculoskeletal: Positive for myalgias.  Neurological: Positive for dizziness (On meclizine).  All other systems reviewed and are negative.  Blood pressure 147/90, pulse 107, temperature 97.4 F (36.3 C), resp. rate 20, height 5\' 6"  (1.676 m), weight 181 lb (82.101 kg), SpO2 96 %. Physical Exam  Nursing note and vitals reviewed. Constitutional: She is oriented to person, place, and time. She appears well-developed and well-nourished. No distress.  HENT:  Head: Normocephalic and atraumatic.  Eyes: EOM are normal. Pupils are equal, round, and reactive to light. No scleral icterus.  Neck: Normal range of motion. Neck supple. No JVD present.  Cardiovascular: Normal rate, regular rhythm, S1 normal and S2 normal.   No murmur heard. Pulses:      Radial pulses are 2+ on the right side, and 2+ on the left side.       Dorsalis pedis pulses are 2+ on the right side, and 2+ on the left side.  Respiratory: Effort normal and breath sounds normal. She has no wheezes. She has no rales.  GI: Soft.  Bowel sounds are normal. She exhibits no distension. There is tenderness (mildly tender in RLQ.  ).  Musculoskeletal: She exhibits no edema.  Lymphadenopathy:    She has no cervical adenopathy.  Neurological: She is alert and oriented to person, place, and time. She exhibits normal muscle tone.  Skin: Skin is warm and dry.  Psychiatric: She has a normal mood and affect.    Assessment/Plan: Chest pain  Sounds chronic and atypical.  Stress test in January 2016 normal.  Cath 2009 with 30% LAD.   RBBB, intermittent  No treatment needed.   Follow as scheduled in the office.   Wilburt Finlay, PAC 03/01/2015, 5:56 PM

## 2015-03-01 NOTE — Interval H&P Note (Signed)
History and Physical Interval Note:  03/01/2015 8:57 AM  Bonnie Gordon  has presented today for surgery, with the diagnosis of TONSILITIS, TURBINATE HYPERTROPHY, OBSTRUCTIVE SLEEP APNEA  The various methods of treatment have been discussed with the patient and family. After consideration of risks, benefits and other options for treatment, the patient has consented to  Procedure(s): TONSILLECTOMY/TURBINATE REDUCTION (Bilateral) UVULOPALATOPHARYNGOPLASTY (UPPP) (Bilateral) as a surgical intervention .  The patient's history has been reviewed, patient examined, no change in status, stable for surgery.  I have reviewed the patient's chart and labs.  Questions were answered to the patient's satisfaction.     Aeric Burnham

## 2015-03-01 NOTE — Anesthesia Preprocedure Evaluation (Signed)
Anesthesia Evaluation  Patient identified by MRN, date of birth, ID band Patient awake    Reviewed: Allergy & Precautions, NPO status , Patient's Chart, lab work & pertinent test results  History of Anesthesia Complications (+) PONV  Airway Mallampati: II       Dental   Pulmonary sleep apnea , former smoker,    Pulmonary exam normal       Cardiovascular hypertension, + CAD and + Peripheral Vascular Disease Normal cardiovascular exam    Neuro/Psych  Headaches, Bipolar Disorder  Neuromuscular disease    GI/Hepatic GERD-  ,  Endo/Other  diabetes, Type 2  Renal/GU      Musculoskeletal   Abdominal   Peds  Hematology   Anesthesia Other Findings   Reproductive/Obstetrics                             Anesthesia Physical Anesthesia Plan  ASA: III  Anesthesia Plan: General   Post-op Pain Management:    Induction: Intravenous  Airway Management Planned: Oral ETT  Additional Equipment:   Intra-op Plan:   Post-operative Plan: Extubation in OR  Informed Consent: I have reviewed the patients History and Physical, chart, labs and discussed the procedure including the risks, benefits and alternatives for the proposed anesthesia with the patient or authorized representative who has indicated his/her understanding and acceptance.     Plan Discussed with: CRNA, Anesthesiologist and Surgeon  Anesthesia Plan Comments:         Anesthesia Quick Evaluation

## 2015-03-01 NOTE — Discharge Instructions (Signed)
Instructions for Home Care After Tonsillectomy  First Day Home: Encourage fluid intake by frequently offering liquids, soup, ice cream jello, etc.  Drink several glasses of water.  Cooler fluids are best.  Avoid hot and highly seasoned foods.  Orange juice, grapefruit juice and tomato juice may cause stinging sensation because of their acidic content.    Second and Third Day Home: Continue liquids and add soft foods, (pudding, macaroni and cheese, mashed potatoes, soft scrambled eggs, etc.).  Make sure you drink plenty of liquids so you do not get dehydrated.  Fifth Thru Seventh Day Home: Gradually resume a normal diet, but avoid hot foods, potato chips, nuts, toast and crackers until 2 weeks after surgery.  General Instructions   No undue physical exertion or exercise for one week.  Children: Tylenol may be used for discomfort and/or fever.  Use as often as necessary within limits of the directions.  Adults: May spray throat with Chloroseptic or other topical anesthetic for discomfort and use pain medication obtained by prescription as directed.    A slight fever (up to 101) is expected for the first the first couple of days.  Take Tylenol (or aspirin substitute) as directed.  Pain in ears is common after tonsillectomy.  It represents pain referred from the throat where the tonsils were removed.  There is usually nothing wrong with the ears in most cases.  Administer Tylenol as needed to control this pain.  White patches will form where the tonsils were removed.  This is perfectly normal.  They will disappear in one to two weeks.  Mouth odor may be notice during the healing stages.  Do not use aspirin for two weeks; it increases the possibility of bleeding.  In a very small percentage of people, there is some bleeding after five to six days.  If this happens, do not become excited, for the bleeding is usually light.  Be quiet, lie down, and spit the blood out gently.  Gargle the throat  with ice water.  If the bleeding does not stop promptly, call the office [phone]), which answers 24 hours a day.  A follow up appointment should be made with Dr. Ezzard Standing 10-14 days following surgery. Please call [phone] for the appointment time.  Take your regular meds Also take Amoxicillin 500 mg twice per day for the next week Tylenol, motrin or hydrocodone elixir 10-15 cc (2-3 tsp) every 6 hrs prn pain

## 2015-03-01 NOTE — Progress Notes (Signed)
Report to [facility]  while I transport another pt.

## 2015-03-01 NOTE — Transfer of Care (Signed)
Immediate Anesthesia Transfer of Care Note  Patient: Bonnie Gordon  Procedure(s) Performed: Procedure(s): TONSILLECTOMY/TURBINATE REDUCTION (Bilateral) UVULOPALATOPHARYNGOPLASTY (UPPP) (Bilateral)  Patient Location: PACU  Anesthesia Type:General  Level of Consciousness: sedated, patient cooperative and responds to stimulation  Airway & Oxygen Therapy: Patient Spontanous Breathing and Patient connected to face mask oxygen  Post-op Assessment: Report given to RN, Post -op Vital signs reviewed and stable and Patient moving all extremities  Post vital signs: Reviewed and stable  Last Vitals:  Filed Vitals:   03/01/15 1030  BP: 147/79  Pulse: 82  Temp: 36.6 C  Resp: 6    Complications: No apparent anesthesia complications

## 2015-03-01 NOTE — Progress Notes (Signed)
Returning from transporting pt. Report received from [facility] RN that 12 lead complete, DR.Katrinka Blazing called to bedside to review and assess pt, Lidocaine ordered and given. Also that pt denies chest pain, SOB, and that no further orders received from Dr.Smith other than cont to monitor.

## 2015-03-01 NOTE — Anesthesia Postprocedure Evaluation (Signed)
  Anesthesia Post-op Note  Patient: Bonnie Gordon  Procedure(s) Performed: Procedure(s): TONSILLECTOMY/TURBINATE REDUCTION (Bilateral) UVULOPALATOPHARYNGOPLASTY (UPPP) (Bilateral)  Patient Location: PACU  Anesthesia Type:General  Level of Consciousness: awake, oriented, sedated and patient cooperative  Airway and Oxygen Therapy: Patient Spontanous Breathing  Post-op Pain: mild  Post-op Assessment: Post-op Vital signs reviewed, Patient's Cardiovascular Status Stable, Respiratory Function Stable, Patent Airway, No signs of Nausea or vomiting and Pain level controlled              Post-op Vital Signs: stable  Last Vitals:  Filed Vitals:   03/01/15 1345  BP: 133/91  Pulse: 94  Temp:   Resp: 14    Complications: No apparent anesthesia complications

## 2015-03-01 NOTE — Progress Notes (Signed)
Post Op check Still in PACU C/o of HA No airway problems  Sats > 92 No bleeding Stable post op

## 2015-03-01 NOTE — OR Nursing (Signed)
Pt previously denied CP or Sob. As of 1715 began to describe CP in midchest that have moved to lower chest.  Dr Katrinka Blazing informed and instructions recvd to call cardiology.  Dr. Mayford Knife has been called and updated to previous assessments and current status.  Awaiting PA for cardiology to assess at bs. No new orders.

## 2015-03-02 ENCOUNTER — Encounter (HOSPITAL_COMMUNITY): Payer: Self-pay | Admitting: Otolaryngology

## 2015-03-02 DIAGNOSIS — I4510 Unspecified right bundle-branch block: Secondary | ICD-10-CM

## 2015-03-02 DIAGNOSIS — G4733 Obstructive sleep apnea (adult) (pediatric): Secondary | ICD-10-CM | POA: Diagnosis not present

## 2015-03-02 DIAGNOSIS — Z87891 Personal history of nicotine dependence: Secondary | ICD-10-CM | POA: Diagnosis not present

## 2015-03-02 DIAGNOSIS — J343 Hypertrophy of nasal turbinates: Secondary | ICD-10-CM | POA: Diagnosis not present

## 2015-03-02 DIAGNOSIS — F319 Bipolar disorder, unspecified: Secondary | ICD-10-CM | POA: Diagnosis not present

## 2015-03-02 DIAGNOSIS — I10 Essential (primary) hypertension: Secondary | ICD-10-CM | POA: Diagnosis not present

## 2015-03-02 DIAGNOSIS — E119 Type 2 diabetes mellitus without complications: Secondary | ICD-10-CM | POA: Diagnosis not present

## 2015-03-02 LAB — GLUCOSE, CAPILLARY: Glucose-Capillary: 154 mg/dL — ABNORMAL HIGH (ref 65–99)

## 2015-03-02 LAB — MRSA PCR SCREENING: MRSA by PCR: NEGATIVE

## 2015-03-02 MED ORDER — PNEUMOCOCCAL VAC POLYVALENT 25 MCG/0.5ML IJ INJ: 0.5000 mL | INJECTION | INTRAMUSCULAR | Status: DC

## 2015-03-02 MED ORDER — HYDROCODONE-ACETAMINOPHEN 7.5-325 MG/15ML PO SOLN: 10.0000 mL | Freq: Four times a day (QID) | ORAL | Status: DC | PRN

## 2015-03-02 MED ORDER — AMOXICILLIN 250 MG/5ML PO SUSR: 500.0000 mg | Freq: Two times a day (BID) | ORAL | Status: AC

## 2015-03-02 NOTE — Op Note (Signed)
NAMEHAELY, Bonnie Gordon                 ACCOUNT NO.:  [id number]  MEDICAL RECORD NO.:  [id number]  LOCATION:  3S06C                        FACILITY:  [facility]  PHYSICIAN:  Kristine Garbe. Ezzard Standing, M.D.DATE OF BIRTH:  February 02, 1956  DATE OF PROCEDURE:  03/01/2015 DATE OF DISCHARGE:                              OPERATIVE REPORT   PREOPERATIVE DIAGNOSES: 1. Obstructive sleep apnea. 2. History of tonsillitis with tonsillar hypertrophy.  POSTOPERATIVE DIAGNOSES: 1. Obstructive sleep apnea. 2. History of tonsillitis with tonsillar hypertrophy with turbinate     hypertrophy.  TEST PERFORMED: 1. Uvulopalatopharyngoplasty with bilateral tonsillectomy. 2. Bilateral inferior turbinate reductions.  (Use of a Medtronic     turbinate blade).  SURGEON:  Kristine Garbe. Ezzard Standing, MD  ANESTHESIA:  General endotracheal.  COMPLICATIONS:  None.  ESTIMATED BLOOD LOSS:  Minimal.  BRIEF CLINICAL NOTE:  Bonnie Gordon is a 59 year old female, who has had a couple year history of obstructive sleep apnea.  She has been treated with nasal CPAP machine, but really does not tolerate this well and does not use her CPAP machine on a regular basis.  Her husband states that she snores very loudly and it obstructs her breathing.  She has had a previous sleep test about a year ago that showed obstructive sleep apnea.  She also has mild nasal congestion, on exam, has septum relatively straight, but has large turbinates.  No polyps.  She was taken to the operating room at this time for uvulopalatopharyngoplasty and bilateral inferior turbinate reductions to help improve her obstructive sleep apnea.  DESCRIPTION OF PROCEDURE:  After adequate endotracheal anesthesia, the patient received 2 g Ancef and 10 mg of Decadron IV preoperatively.  A mouth gag was used to expose the oropharynx.  Using the electrocautery, the tonsils were resected from both the tonsil fossas.  The uvula was transected at its base of attachment and  additional about 0.5 cm of distal soft palate on either side of the uvula was excised.  Hemostasis was obtained with cautery.  The mucosal edges on either side of the soft palate were reapproximated with 3-0 Vicryl and 3-0 chromic sutures. There was no significant bleeding.  This completed the uvulopalatopharyngoplasty along with the tonsillectomy.  Next, the nose was examined.  Septum was fairly straight.  She had a large edematous inferior turbinates bilaterally.  Using the Medtronic turbinate blade, submucosal turbinate reductions were performed bilaterally.  She had little bit more bleeding on the right side and a suction cautery was used more on the right side to help control bleeding.  Following turbinate reductions, the procedure was completed, no packing was required.  The patient was awoken from anesthesia and transferred to recovery room, postop doing well.  DISPOSITION:  The patient will be observed overnight for 24-hour observation because of the history of obstructive sleep apnea.  We will plan on discharge in the morning on amoxicillin suspension 5 mg b.i.d. for [redacted] week along with Tylenol and hydrocodone Elixir 2-3 teaspoons q.6 hours p.r.n. pain.  I will follow up in my office in 10 to 14 days for recheck.          ______________________________ Kristine Garbe. Ezzard Standing, M.D.     CEN/MEDQ  D:  03/01/2015  T:  03/02/2015  Job:  [id number]  cc:   Kristine Garbe. Ezzard Standing, M.D.'s office

## 2015-03-02 NOTE — Progress Notes (Signed)
POD 1 VSS AF O2 sats above 92 No bleeding . OK pos Cardiology was consulted because of atypical chest pain. Cleared by cardiology with no evidence of ischemia. Will plan discharge this am and follow up in 10-14 days Discharge meds include Amoxicillin and Hydrocodone elixir in addition to her regular meds. Stable post op course.

## 2015-03-02 NOTE — Progress Notes (Signed)
Pt given discharge packet. Education and medication regimen reviewed with patient. Pt states no further questions at this time. Pt declined morning medications, and would like to take her medications at home. Pt family at bedside and ready to transport her home.

## 2015-03-03 NOTE — Discharge Summary (Signed)
Bonnie Gordon, Bonnie Gordon                 ACCOUNT NO.:  [id number]  MEDICAL RECORD NO.:  [id number]  LOCATION:  3S06C                        FACILITY:  [facility]  PHYSICIAN:  Kristine Garbe. Ezzard Standing, M.D.DATE OF BIRTH:  12-Sep-1955  DATE OF ADMISSION:  03/01/2015 DATE OF DISCHARGE:  03/02/2015                              DISCHARGE SUMMARY   DIAGNOSES: 1. Obstructive sleep apnea. 2. Non-insulin-dependent diabetes. 3. History of coronary artery disease. 4. Hypertension.  OPERATIONS DURING THIS HOSPITALIZATION:  Uvulopalatopharyngoplasty with tonsillectomy and bilateral inferior turbinate reductions to treat obstructive sleep apnea on date March 01, 2015.  CONSULT DURING HOSPITALIZATION:  Cardiology per Anesthesia.  HOSPITAL COURSE:  The patient was admitted via the operating room on March 01, 2015, at which time she underwent uvulopalatopharyngoplasty with tonsillectomy and turbinate reductions to help treat and improve her obstructive sleep apnea.  She had been prescribed home therapy with nasal CPAP but really did not tolerate this well as was not using her nasal CPAP at home.  She had generous-sized tonsils with redundant soft palate mucosa along with partial nasal obstruction from turbinate hypertrophy and underwent surgery to improve her airway and improve her sleep apnea.  She tolerated the surgery well but postoperatively, complained of some chest pain.  She also had EKG findings of right bundle-branch block and because of this, Cardiology consult was ordered by Anesthesia.  She maintained good O2 saturations during her hospitalization, had no bleeding problems, and tolerating liquid diet without too much difficulty.  On Cardiology consult, there was really no clinical evidence of ischemic heart disease and they attributed to chronic and atypical chest pain.  They did not recommend any further treatment.  She will follow up with her medical doctor and Cardiology as previously scheduled.   The patient was subsequently discharged home on her first postoperative day with no problems during the hospitalization.  DISPOSITION:  The patient is discharged home on her regular  medications in addition to amoxicillin 500 mg b.i.d. for 1 week and also hydrocodone elixir 2-3 teaspoons q.6 hours p.r.n. pain.  She is instructed on a soft- liquid diet for the next few days, and we will have her follow up in my office in 10-14 days for recheck.          ______________________________ Kristine Garbe. Ezzard Standing, M.D.     CEN/MEDQ  D:  03/02/2015  T:  03/03/2015  Job:  [id number]

## 2015-03-22 DIAGNOSIS — R51 Headache: Secondary | ICD-10-CM | POA: Diagnosis not present

## 2015-03-22 DIAGNOSIS — E785 Hyperlipidemia, unspecified: Secondary | ICD-10-CM | POA: Diagnosis not present

## 2015-03-22 DIAGNOSIS — E538 Deficiency of other specified B group vitamins: Secondary | ICD-10-CM | POA: Diagnosis not present

## 2015-03-22 DIAGNOSIS — K219 Gastro-esophageal reflux disease without esophagitis: Secondary | ICD-10-CM | POA: Diagnosis not present

## 2015-03-22 DIAGNOSIS — E119 Type 2 diabetes mellitus without complications: Secondary | ICD-10-CM | POA: Diagnosis not present

## 2015-03-22 DIAGNOSIS — F329 Major depressive disorder, single episode, unspecified: Secondary | ICD-10-CM | POA: Diagnosis not present

## 2015-03-22 DIAGNOSIS — R635 Abnormal weight gain: Secondary | ICD-10-CM | POA: Diagnosis not present

## 2015-03-22 DIAGNOSIS — I10 Essential (primary) hypertension: Secondary | ICD-10-CM | POA: Diagnosis not present

## 2015-04-21 DIAGNOSIS — R51 Headache: Secondary | ICD-10-CM | POA: Diagnosis not present

## 2015-04-21 DIAGNOSIS — R635 Abnormal weight gain: Secondary | ICD-10-CM | POA: Diagnosis not present

## 2015-04-21 DIAGNOSIS — Z23 Encounter for immunization: Secondary | ICD-10-CM | POA: Diagnosis not present

## 2015-04-21 DIAGNOSIS — I10 Essential (primary) hypertension: Secondary | ICD-10-CM | POA: Diagnosis not present

## 2015-04-21 DIAGNOSIS — K219 Gastro-esophageal reflux disease without esophagitis: Secondary | ICD-10-CM | POA: Diagnosis not present

## 2015-04-21 DIAGNOSIS — E785 Hyperlipidemia, unspecified: Secondary | ICD-10-CM | POA: Diagnosis not present

## 2015-04-21 DIAGNOSIS — E119 Type 2 diabetes mellitus without complications: Secondary | ICD-10-CM | POA: Diagnosis not present

## 2015-06-04 DIAGNOSIS — J310 Chronic rhinitis: Secondary | ICD-10-CM | POA: Diagnosis not present

## 2015-07-14 DIAGNOSIS — L659 Nonscarring hair loss, unspecified: Secondary | ICD-10-CM | POA: Diagnosis not present

## 2015-07-14 DIAGNOSIS — L821 Other seborrheic keratosis: Secondary | ICD-10-CM | POA: Diagnosis not present

## 2015-07-14 DIAGNOSIS — L853 Xerosis cutis: Secondary | ICD-10-CM | POA: Diagnosis not present

## 2015-07-14 DIAGNOSIS — L918 Other hypertrophic disorders of the skin: Secondary | ICD-10-CM | POA: Diagnosis not present

## 2015-07-19 ENCOUNTER — Other Ambulatory Visit: Payer: Self-pay | Admitting: Cardiovascular Disease

## 2015-07-19 DIAGNOSIS — M542 Cervicalgia: Secondary | ICD-10-CM | POA: Diagnosis not present

## 2015-07-19 DIAGNOSIS — M25511 Pain in right shoulder: Secondary | ICD-10-CM | POA: Diagnosis not present

## 2015-07-19 DIAGNOSIS — N898 Other specified noninflammatory disorders of vagina: Secondary | ICD-10-CM | POA: Diagnosis not present

## 2015-07-19 DIAGNOSIS — R21 Rash and other nonspecific skin eruption: Secondary | ICD-10-CM | POA: Diagnosis not present

## 2015-07-19 DIAGNOSIS — Z113 Encounter for screening for infections with a predominantly sexual mode of transmission: Secondary | ICD-10-CM | POA: Diagnosis not present

## 2015-07-19 DIAGNOSIS — M25512 Pain in left shoulder: Secondary | ICD-10-CM | POA: Diagnosis not present

## 2015-07-19 NOTE — Telephone Encounter (Signed)
Rx request sent to pharmacy.  

## 2015-07-27 DIAGNOSIS — M25511 Pain in right shoulder: Secondary | ICD-10-CM | POA: Diagnosis not present

## 2015-07-27 DIAGNOSIS — M542 Cervicalgia: Secondary | ICD-10-CM | POA: Diagnosis not present

## 2015-07-28 DIAGNOSIS — M25511 Pain in right shoulder: Secondary | ICD-10-CM | POA: Diagnosis not present

## 2015-07-28 DIAGNOSIS — R140 Abdominal distension (gaseous): Secondary | ICD-10-CM | POA: Diagnosis not present

## 2015-07-28 DIAGNOSIS — E6609 Other obesity due to excess calories: Secondary | ICD-10-CM | POA: Diagnosis not present

## 2015-07-28 DIAGNOSIS — M542 Cervicalgia: Secondary | ICD-10-CM | POA: Diagnosis not present

## 2015-08-04 DIAGNOSIS — M25511 Pain in right shoulder: Secondary | ICD-10-CM | POA: Diagnosis not present

## 2015-08-04 DIAGNOSIS — M542 Cervicalgia: Secondary | ICD-10-CM | POA: Diagnosis not present

## 2015-08-05 DIAGNOSIS — M542 Cervicalgia: Secondary | ICD-10-CM | POA: Diagnosis not present

## 2015-08-05 DIAGNOSIS — M25511 Pain in right shoulder: Secondary | ICD-10-CM | POA: Diagnosis not present

## 2015-08-11 DIAGNOSIS — M25511 Pain in right shoulder: Secondary | ICD-10-CM | POA: Diagnosis not present

## 2015-08-11 DIAGNOSIS — M542 Cervicalgia: Secondary | ICD-10-CM | POA: Diagnosis not present

## 2015-08-12 DIAGNOSIS — M25511 Pain in right shoulder: Secondary | ICD-10-CM | POA: Diagnosis not present

## 2015-08-12 DIAGNOSIS — M542 Cervicalgia: Secondary | ICD-10-CM | POA: Diagnosis not present

## 2015-08-16 DIAGNOSIS — M542 Cervicalgia: Secondary | ICD-10-CM | POA: Diagnosis not present

## 2015-08-16 DIAGNOSIS — M25511 Pain in right shoulder: Secondary | ICD-10-CM | POA: Diagnosis not present

## 2015-08-18 DIAGNOSIS — M25511 Pain in right shoulder: Secondary | ICD-10-CM | POA: Diagnosis not present

## 2015-08-18 DIAGNOSIS — M542 Cervicalgia: Secondary | ICD-10-CM | POA: Diagnosis not present

## 2015-08-18 DIAGNOSIS — R140 Abdominal distension (gaseous): Secondary | ICD-10-CM | POA: Diagnosis not present

## 2015-08-18 DIAGNOSIS — N941 Unspecified dyspareunia: Secondary | ICD-10-CM | POA: Diagnosis not present

## 2015-08-23 DIAGNOSIS — M79601 Pain in right arm: Secondary | ICD-10-CM | POA: Diagnosis not present

## 2015-08-30 ENCOUNTER — Ambulatory Visit (INDEPENDENT_AMBULATORY_CARE_PROVIDER_SITE_OTHER): Payer: Medicare Other | Admitting: Cardiovascular Disease

## 2015-08-30 ENCOUNTER — Encounter: Payer: Self-pay | Admitting: Cardiovascular Disease

## 2015-08-30 VITALS — BP 136/100 | HR 69 | Ht 66.0 in | Wt 181.2 lb

## 2015-08-30 DIAGNOSIS — Z794 Long term (current) use of insulin: Secondary | ICD-10-CM

## 2015-08-30 DIAGNOSIS — Z9989 Dependence on other enabling machines and devices: Secondary | ICD-10-CM

## 2015-08-30 DIAGNOSIS — E785 Hyperlipidemia, unspecified: Secondary | ICD-10-CM | POA: Diagnosis not present

## 2015-08-30 DIAGNOSIS — I10 Essential (primary) hypertension: Secondary | ICD-10-CM

## 2015-08-30 DIAGNOSIS — E119 Type 2 diabetes mellitus without complications: Secondary | ICD-10-CM

## 2015-08-30 DIAGNOSIS — I2583 Coronary atherosclerosis due to lipid rich plaque: Secondary | ICD-10-CM

## 2015-08-30 DIAGNOSIS — I2510 Atherosclerotic heart disease of native coronary artery without angina pectoris: Secondary | ICD-10-CM

## 2015-08-30 DIAGNOSIS — Z79899 Other long term (current) drug therapy: Secondary | ICD-10-CM

## 2015-08-30 DIAGNOSIS — G4733 Obstructive sleep apnea (adult) (pediatric): Secondary | ICD-10-CM

## 2015-08-30 LAB — CBC
HCT: 42 % (ref 36.0–46.0)
Hemoglobin: 14.3 g/dL (ref 12.0–15.0)
MCH: 27.9 pg (ref 26.0–34.0)
MCHC: 34 g/dL (ref 30.0–36.0)
MCV: 82 fL (ref 78.0–100.0)
MPV: 10.8 fL (ref 8.6–12.4)
Platelets: 221 10*3/uL (ref 150–400)
RBC: 5.12 MIL/uL — ABNORMAL HIGH (ref 3.87–5.11)
RDW: 14 % (ref 11.5–15.5)
WBC: 4.6 10*3/uL (ref 4.0–10.5)

## 2015-08-30 LAB — COMPREHENSIVE METABOLIC PANEL
ALT: 22 U/L (ref 6–29)
AST: 15 U/L (ref 10–35)
Albumin: 4.2 g/dL (ref 3.6–5.1)
Alkaline Phosphatase: 140 U/L — ABNORMAL HIGH (ref 33–130)
BUN: 9 mg/dL (ref 7–25)
CO2: 32 mmol/L — ABNORMAL HIGH (ref 20–31)
Calcium: 9.7 mg/dL (ref 8.6–10.4)
Chloride: 100 mmol/L (ref 98–110)
Creat: 0.66 mg/dL (ref 0.50–1.05)
Glucose, Bld: 132 mg/dL — ABNORMAL HIGH (ref 65–99)
Potassium: 3.8 mmol/L (ref 3.5–5.3)
Sodium: 142 mmol/L (ref 135–146)
Total Bilirubin: 0.5 mg/dL (ref 0.2–1.2)
Total Protein: 7.4 g/dL (ref 6.1–8.1)

## 2015-08-30 LAB — TSH: TSH: 0.835 u[IU]/mL (ref 0.350–4.500)

## 2015-08-30 LAB — LIPID PANEL
Cholesterol: 236 mg/dL — ABNORMAL HIGH (ref 125–200)
HDL: 62 mg/dL (ref >=46)
LDL Cholesterol: 140 mg/dL — ABNORMAL HIGH (ref <130)
Total CHOL/HDL Ratio: 3.8 Ratio (ref <=5.0)
Triglycerides: 169 mg/dL — ABNORMAL HIGH (ref <150)
VLDL: 34 mg/dL — ABNORMAL HIGH (ref <30)

## 2015-08-30 LAB — HEMOGLOBIN A1C
Hgb A1c MFr Bld: 8.1 % — ABNORMAL HIGH (ref <5.7)
Mean Plasma Glucose: 186 mg/dL — ABNORMAL HIGH (ref <117)

## 2015-08-30 MED ORDER — HYDROCHLOROTHIAZIDE 12.5 MG PO CAPS: 12.5000 mg | ORAL_CAPSULE | Freq: Every day | ORAL | Status: DC

## 2015-08-30 NOTE — Patient Instructions (Signed)
Your physician recommends that you return for lab work TODAY.  Your physician has recommended you make the following change in your medication: start new prescription for HCTZ 12.5 MG this has been [facility] to [facility].  Your physician wants you to follow-up in: 6 months or sooner if needed with Dr Tresa Endo. You will receive a reminder letter in the mail two months in advance. If you don't receive a letter, please call our office to schedule the follow-up appointment.  If you need a refill on your cardiac medications before your next appointment, please call your pharmacy.

## 2015-09-01 ENCOUNTER — Encounter: Payer: Self-pay | Admitting: Cardiovascular Disease

## 2015-09-01 NOTE — Progress Notes (Signed)
Patient ID: Bonnie Gordon, female   DOB: 1956/07/18, 60 y.o.   MRN: [id number]   Primary MD:  Dr. Arnoldo Morale   HPI: Bonnie Gordon is a 60 y.o. female who presents for a one-yearfollow-up evaluation.   I had seen Ms. Nori Gordon initially in 2004 after she had experienced chest pressure and was evaluated at [facility] emergency room.  A nuclear perfusion study raised the possibility of anterior to inferolateral wall ischemia.  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  She was treated aggressively with statin therapy in attempt to induce plaque or aggression.  She had a second heart catheterization which was done by Dr. Gwenlyn Found in 2009 which showed 30% LAD and RCA stenoses with a normal nondominant circumflex and with continued normal LV function.  She developed diabetes mellitus over the last 4 years.  She is on medication for hypertension as well as GERD, hyperlipidemia, asthma and peripheral neuropathy.  She also was diagnosed with sleep apnea and is using CPAP therapy.  When I saw her one year ago she had developed episodes of chest discomfort.  Oftentimes these are sharp and short-lived.   A 2-D echo Doppler study on 08/12/2014 showed normal systolic function without regional wall motion abnormalities or abnormal valvular architecture.  A nuclear perfusion study revealed an ejection fraction of 58% with normal perfusion and function.  There were no ECG changes on the study.  She underwent laboratory which demonstrated a hemoglobin of 13.9, hematocrit 41.3.  Electrolytes were normal, although glucose was mildly elevated at 1:15.  She had normal liver function studies with the exception of minimal alkaline phosphatase elevation at 136.  Her lipids were excellent with a total cholesterol 151, triglycerides 97, HDL 52,  LDL 80.  Hemoglobin A1c was increased at 7.0.  She presents now for follow-up evaluation.  Since I last saw her, she tells me that she has stopped taking Crestor. She remains inactive and does not exercise. Noted some mild leg swelling, left leg greater than right. Has been on amlodipine 5 mg for hypertension and is diabetic on Amaryl 4 mg She continues to use CPAP.  She presents for evaluation.  Past Medical History  Diagnosis Date  . Hypertension   . Hypokalemia   . Diverticulitis   . Hypercholesteremia   . Vertigo     patient reported  . Chest pain 2009    echo  . Sleep apnea   . Complication of anesthesia     pt. states she is difficult to wake up  . PONV (postoperative nausea and vomiting)   . Diabetes mellitus     Type II  . History of bronchitis   . Shortness of breath dyspnea   . Anxiety   . Depression   . Bipolar disorder ([place])   . GERD (gastroesophageal reflux disease)   . Headache   . Diabetic neuropathy [facility])     Past Surgical History  Procedure Laterality Date  . Cholecystectomy    . Abdominal hysterectomy    . Appendectomy    . Wisdom tooth extraction    . Cardiac catheterization  2009    normal cath  . Cardiac catheterization  2004    normal cath  . Tonsillectomy/adenoidectomy/turbinate reduction Bilateral 03/01/2015    Procedure: TONSILLECTOMY/TURBINATE REDUCTION;  Surgeon: Rozetta Nunnery, MD;  Location: [facility];  Service: ENT;  Laterality: Bilateral;  . Uvulopalatopharyngoplasty Bilateral 03/01/2015    Procedure: UVULOPALATOPHARYNGOPLASTY (UPPP);  Surgeon: Rozetta Nunnery, MD;  Location: [place];  Service: ENT;  Laterality: Bilateral;    Allergies  Allergen Reactions  . Other     No Blood Products  . Asa [Aspirin] Other (See Comments)    Bruises easily  . Latex Itching and Rash    Current Outpatient Prescriptions  Medication Sig Dispense Refill  . ACCU-CHEK AVIVA PLUS test strip   5  . ACCU-CHEK SOFTCLIX LANCETS lancets   5  .  Alpha-D-Galactosidase (BEANO PO) Take by mouth as needed.    Marland Kitchen amLODipine (NORVASC) 5 MG tablet TAKE 1 TABLET BY MOUTH EVERY DAY 90 tablet 0  . aspirin EC 81 MG tablet Take 81 mg by mouth daily.    Marland Kitchen glimepiride (AMARYL) 4 MG tablet Take 4 mg by mouth daily with breakfast.    . meloxicam (MOBIC) 7.5 MG tablet Take 7.5 mg by mouth daily.    . Multiple Vitamins-Minerals (MULTIVITAMIN WITH MINERALS) tablet Take 1 tablet by mouth daily.    Marland Kitchen OVER THE COUNTER MEDICATION Take 1 tablet by mouth daily. Take 1 tab daily magnesium and malic acid combined into 1 tab    . VENTOLIN HFA 108 (90 BASE) MCG/ACT inhaler Inhale 2 puffs into the lungs 4 (four) times daily.  0  . hydrochlorothiazide (MICROZIDE) 12.5 MG capsule Take 1 capsule (12.5 mg total) by mouth daily. 30 capsule 6   No current facility-administered medications for this visit.    Socially she is divorced.  She quit tobacco use in 1986.  She does drink occasional alcohol.  She does not routinely exercise.  Family History  Problem Relation Age of Onset  . Diabetes Mother   . Gout Mother   . Hypertension Mother   . Arthritis Mother   . Heart disease Father   . Heart failure Sister   . Arthritis Sister   . Heart disease Maternal Grandmother   . Diabetes Maternal Grandmother   . Heart disease Paternal Grandmother   . Diabetes Paternal Grandmother   . Diabetes Sister   . Bursitis Sister   . Crohn's disease Sister    Family history is notable that her mother is alive at 42 and has diabetes mellitus, gout, and hypertension.  Father died at age 61 with heart failure and had a pacemaker.  One brother also has a pacemaker.  One sister has congestive heart failure.  Another sister has diabetes mellitus.  Another sister has Crohn's disease.  ROS General: Negative; No fevers, chills, or night sweats HEENT: Negative; No changes in vision or hearing, sinus congestion, difficulty swallowing Pulmonary: Negative; No cough, wheezing, shortness of  breath, hemoptysis Cardiovascular:  See HPI; No chest pain, presyncope, syncope, palpitations, edema GI: Positive for GERD on omeprazole; No nausea, vomiting, diarrhea, or abdominal pain GU: Negative; No dysuria, hematuria, or difficulty voiding Musculoskeletal: Negative; no myalgias, joint pain, or weakness Hematologic/Oncologic: Negative; no easy bruising, bleeding Endocrine: Negative; no heat/cold intolerance; no diabetes Neuro: Positive for peripheral neuropathy on gabapentin Skin: Negative; No rashes or skin lesions Psychiatric: Negative; No behavioral problems, depression Sleep: Positive for obstructive sleep apnea on CPAP therapy with mild daytime sleepiness, hypersomnolence; no bruxism, restless legs, hypnogagnic hallucinations Other comprehensive 14 point system review is negative   Physical Exam BP 136/100 mmHg  Pulse 69  Ht _0  (1.676 m)  Wt 181 lb 4 oz (82.214 kg)  BMI 29.27 kg/m2    Repeat  blood pressure by me 138/84.  Wt Readings from Last 3 Encounters:  08/30/15 181 lb 4 oz (82.214 kg)  03/01/15 180 lb 8.9 oz (81.9 kg)  02/25/15 181 lb (82.101 kg)   General: Alert, oriented, no distress.  Skin: normal turgor, no rashes, warm and dry HEENT: Normocephalic, atraumatic. Pupils equal round and reactive to light; sclera anicteric; extraocular muscles intact; Fundi no hemorrhages or exudates.  Sharp discs. Nose without nasal septal hypertrophy Mouth/Parynx benign; Mallinpatti scale 3 Neck: No JVD, no carotid bruits; normal carotid upstroke Lungs: clear to ausculatation and percussion; no wheezing or rales Chest wall: Definite tenderness to palpation over the left costochondral region. Heart: PMI not displaced, RRR, s1 s2 normal, 1/6 systolic murmur, no diastolic murmur, no rubs, gallops, thrills, or heaves Abdomen: soft, nontender; no hepatosplenomehaly, BS+; abdominal aorta nontender and not dilated by palpation. Back: no CVA tenderness Pulses 2+ Musculoskeletal:  full range of motion, normal strength, no joint deformities Extremities:  Trace edema, left greater than right.no clubbing cyanosis , Homan's sign negative  Neurologic: grossly nonfocal; Cranial nerves grossly wnl Psychologic: Normal mood and affect  ECG (independently read by me):  Normal sinus rhythm with a PAC.  QTc interval 422  Hanuary 2016 ECG (independently read by me): Normal sinus rhythm at 77 bpm.  QRS complex V1 V2; nondiagnostic T changes anterolaterally.  QTc interval 454 ms  LABS:  BMP Latest Ref Rng 08/30/2015 02/25/2015 10/23/2014  Glucose 65 - 99 mg/dL 132(H) 122(H) 292(H)  BUN 7 - 25 mg/dL _0 Creatinine 0.50 - 1.05 mg/dL 0.66 0.70 0.66  Sodium 135 - 146 mmol/L 142 139 139  Potassium 3.5 - 5.3 mmol/L 3.8 3.5 3.1(L)  Chloride 98 - 110 mmol/L 100 101 101  CO2 20 - 31 mmol/L 32(H) 30 27  Calcium 8.6 - 10.4 mg/dL 9.7 9.3 9.2   Hepatic Function Latest Ref Rng 08/30/2015 08/26/2014 12/22/2013  Total Protein 6.1 - 8.1 g/dL 7.4 7.4 8.1  Albumin 3.6 - 5.1 g/dL 4.2 4.2 3.8  AST 10 - 35 U/L _1 ALT 6 - 29 U/L _2 Alk Phosphatase 33 - 130 U/L 140(H) 136(H) 108  Total Bilirubin 0.2 - 1.2 mg/dL 0.5 0.3 0.3   CBC Latest Ref Rng 08/30/2015 02/25/2015 10/23/2014  WBC 4.0 - 10.5 K/uL 4.6 5.5 9.3  Hemoglobin 12.0 - 15.0 g/dL 14.3 13.6 14.1  Hematocrit 36.0 - 46.0 % 42.0 40.1 42.9  Platelets 150 - 400 K/uL 221 195 194   Lab Results  Component Value Date   MCV 82.0 08/30/2015   MCV 82.9 02/25/2015   MCV 84.1 10/23/2014   Lab Results  Component Value Date   TSH 0.835 08/30/2015   Lab Results  Component Value Date   HGBA1C 8.1* 08/30/2015   Lipid Panel     Component Value Date/Time   CHOL 236* 08/30/2015 1042   TRIG 169* 08/30/2015 1042   HDL 62 08/30/2015 1042   CHOLHDL 3.8 08/30/2015 1042   VLDL 34* 08/30/2015 1042   LDLCALC 140* 08/30/2015 1042    RADIOLOGY: No results found.   ASSESSMENT AND PLAN: Ms. Margit Hanks is a 60 year old African-American  female who has previously been documented have mild CAD at initial catheterization in 2004 and subsequent cardiac catheterization in 2009.  She has developed diabetes mellitus, hypertension, obstructive sleep apnea and peripheral neuropathy.  Presently, she has been on amlodipine 5 mg for blood pressure control.  She apparently is no longer taking her  previous losartan.  I am adding HCTZ 12.5 mg to her medical regimen which should be helpful both for her blood pressure as well as peripheral edema.  She is fasting today and I will check a complete set of laboratory to assess renal function lipid status and LFTs.  She had been on Crestor but apparently is no longer taking this.  With her diabetes mellitus ideally, she should be on an ACE-I or an ARB therapy and her LDL should be treated aggressively to less than 70. Adjustments will be made to her medical regimen.  We discussed an exercise program. I will contact her regarding her laboratory and medication adjustments.  Continued CPAP therapy was reemphasized.  I will see her in 6 months for reevaluation or sooner if problems arise.   Time spent: 25 minutes Troy Sine, MD, [facility] 09/01/2015 7:01 PM

## 2015-09-09 ENCOUNTER — Telehealth: Payer: Self-pay | Admitting: *Deleted

## 2015-09-09 NOTE — Telephone Encounter (Signed)
Left message to return a call to discuss lab results. 

## 2015-09-09 NOTE — Telephone Encounter (Signed)
-----   Message from Lennette Bihari, MD sent at 08/31/2015  7:23 AM EST ----- Needs improved DM control; copy to primary; if pt is on crestor 20 mg, increase to 40 mg

## 2015-09-13 NOTE — Telephone Encounter (Signed)
Bonnie Gordon is returning your call about her lab work . Please call   Thanks

## 2015-09-18 ENCOUNTER — Emergency Department (INDEPENDENT_AMBULATORY_CARE_PROVIDER_SITE_OTHER)
Admission: EM | Admit: 2015-09-18 | Discharge: 2015-09-18 | Disposition: A | Discharge status: Home / Self Care | Payer: Medicare Other | Source: Home / Self Care | Attending: Family Medicine | Admitting: Family Medicine

## 2015-09-18 ENCOUNTER — Encounter (HOSPITAL_COMMUNITY): Payer: Self-pay | Admitting: Emergency Medicine

## 2015-09-18 DIAGNOSIS — R1032 Left lower quadrant pain: Secondary | ICD-10-CM

## 2015-09-18 LAB — POCT URINALYSIS DIP (DEVICE)
Bilirubin Urine: NEGATIVE
Glucose, UA: NEGATIVE mg/dL
Hgb urine dipstick: NEGATIVE
Ketones, ur: NEGATIVE mg/dL
Leukocytes, UA: NEGATIVE
Nitrite: NEGATIVE
Protein, ur: NEGATIVE mg/dL
Specific Gravity, Urine: 1.01 (ref 1.005–1.030)
Urobilinogen, UA: 0.2 mg/dL (ref 0.0–1.0)
pH: 7 (ref 5.0–8.0)

## 2015-09-18 NOTE — Discharge Instructions (Signed)
Clear liquid diet until mon, return to ER if problem worsens, see your doctor as planned.

## 2015-09-18 NOTE — ED Notes (Signed)
Pt here with lower abdominal pain radiating to back area. More pain reported on left side Urine pressure with frequency/urge reported as well Denies fever,chills,n,v or vaginal discharge

## 2015-09-18 NOTE — ED Provider Notes (Signed)
CSN: [id number]     Arrival date & time 09/18/15  1446 History   First MD Initiated Contact with Patient 09/18/15 1543     Chief Complaint  Patient presents with  . Urinary Tract Infection  . Abdominal Pain   (Consider location/radiation/quality/duration/timing/severity/associated sxs/prior Treatment) Patient is a 60 y.o. female presenting with urinary tract infection and abdominal pain. The history is provided by the patient.  Urinary Tract Infection Pain quality:  Sharp Pain severity:  Mild Onset quality:  Gradual Duration:  3 days Chronicity:  Recurrent Recent urinary tract infections: no   Worsened by:  Nothing tried Urinary symptoms: frequent urination   Urinary symptoms: no discolored urine and no hematuria   Associated symptoms: abdominal pain   Associated symptoms: no fever, no flank pain, no nausea, no vaginal discharge and no vomiting   Abdominal Pain Associated symptoms: no constipation, no diarrhea, no dysuria, no fever, no nausea, no vaginal bleeding, no vaginal discharge and no vomiting     Past Medical History  Diagnosis Date  . Hypertension   . Hypokalemia   . Diverticulitis   . Hypercholesteremia   . Vertigo     patient reported  . Chest pain 2009    echo  . Sleep apnea   . Complication of anesthesia     pt. states she is difficult to wake up  . PONV (postoperative nausea and vomiting)   . Diabetes mellitus     Type II  . History of bronchitis   . Shortness of breath dyspnea   . Anxiety   . Depression   . Bipolar disorder ([facility])   . GERD (gastroesophageal reflux disease)   . Headache   . Diabetic neuropathy [facility])    Past Surgical History  Procedure Laterality Date  . Cholecystectomy    . Abdominal hysterectomy    . Appendectomy    . Wisdom tooth extraction    . Cardiac catheterization  2009    normal cath  . Cardiac catheterization  2004    normal cath  . Tonsillectomy/adenoidectomy/turbinate reduction Bilateral 03/01/2015    Procedure:  TONSILLECTOMY/TURBINATE REDUCTION;  Surgeon: Drema Halon, MD;  Location: [facility] OR;  Service: ENT;  Laterality: Bilateral;  . Uvulopalatopharyngoplasty Bilateral 03/01/2015    Procedure: UVULOPALATOPHARYNGOPLASTY (UPPP);  Surgeon: Drema Halon, MD;  Location: [facility] OR;  Service: ENT;  Laterality: Bilateral;   Family History  Problem Relation Age of Onset  . Diabetes Mother   . Gout Mother   . Hypertension Mother   . Arthritis Mother   . Heart disease Father   . Heart failure Sister   . Arthritis Sister   . Heart disease Maternal Grandmother   . Diabetes Maternal Grandmother   . Heart disease Paternal Grandmother   . Diabetes Paternal Grandmother   . Diabetes Sister   . Bursitis Sister   . Crohn's disease Sister    Social History  Substance Use Topics  . Smoking status: Former Smoker    Quit date: 08/07/1982  . Smokeless tobacco: Never Used  . Alcohol Use: Yes     Comment: occasional / social   OB History    No data available     Review of Systems  Constitutional: Negative for fever.  Gastrointestinal: Positive for abdominal pain. Negative for nausea, vomiting, diarrhea, constipation and blood in stool.  Genitourinary: Negative for dysuria, urgency, flank pain, vaginal bleeding and vaginal discharge.  All other systems reviewed and are negative.   Allergies  Other; Asa; and Latex  Home Medications   Prior to Admission medications   Medication Sig Start Date End Date Taking? Authorizing Provider  ACCU-CHEK AVIVA PLUS test strip  04/16/14   Historical Provider, MD  ACCU-CHEK SOFTCLIX LANCETS lancets  04/16/14   Historical Provider, MD  Alpha-D-Galactosidase (BEANO PO) Take by mouth as needed.    Historical Provider, MD  amLODipine (NORVASC) 5 MG tablet TAKE 1 TABLET BY MOUTH EVERY DAY 07/19/15   Lennette Bihari, MD  aspirin EC 81 MG tablet Take 81 mg by mouth daily.    Historical Provider, MD  glimepiride (AMARYL) 4 MG tablet Take 4 mg by mouth daily with  breakfast.    Historical Provider, MD  hydrochlorothiazide (MICROZIDE) 12.5 MG capsule Take 1 capsule (12.5 mg total) by mouth daily. 08/30/15   Lennette Bihari, MD  meloxicam (MOBIC) 7.5 MG tablet Take 7.5 mg by mouth daily.    Historical Provider, MD  Multiple Vitamins-Minerals (MULTIVITAMIN WITH MINERALS) tablet Take 1 tablet by mouth daily.    Historical Provider, MD  OVER THE COUNTER MEDICATION Take 1 tablet by mouth daily. Take 1 tab daily magnesium and malic acid combined into 1 tab    Historical Provider, MD  VENTOLIN HFA 108 (90 BASE) MCG/ACT inhaler Inhale 2 puffs into the lungs 4 (four) times daily. 02/12/15   Historical Provider, MD   Meds Ordered and Administered this Visit  Medications - No data to display  BP 155/102 mmHg  Pulse 89  Temp(Src) 98.2 F (36.8 C) (Oral)  SpO2 98% No data found.   Physical Exam  Constitutional: She is oriented to person, place, and time. She appears well-developed and well-nourished. No distress.  Pulmonary/Chest: Breath sounds normal.  Abdominal: Soft. Bowel sounds are normal. She exhibits no distension and no mass. There is tenderness. There is no rebound and no guarding.  Neurological: She is alert and oriented to person, place, and time.  Skin: Skin is warm and dry.  Nursing note and vitals reviewed.   ED Course  Procedures (including critical care time)  Labs Review Labs Reviewed  POCT URINALYSIS DIP (DEVICE)    Imaging Review No results found.   Visual Acuity Review  Right Eye Distance:   Left Eye Distance:   Bilateral Distance:    Right Eye Near:   Left Eye Near:    Bilateral Near:         MDM   1. Abdominal pain, acute, left lower quadrant    Pt has had neg colonoscopy 3 yr ago for poss divert, told neg, had neg recent gyn eval with u/s, agrees to d/c with f/u next week with lmd, will return to ER if problem worsens.   Linna Hoff, MD 09/18/15 [id number]

## 2015-09-19 ENCOUNTER — Emergency Department (HOSPITAL_COMMUNITY): Payer: Medicare Other

## 2015-09-19 ENCOUNTER — Emergency Department (HOSPITAL_COMMUNITY)
Admission: EM | Admit: 2015-09-19 | Discharge: 2015-09-19 | Disposition: A | Discharge status: Home / Self Care | Payer: Medicare Other | Attending: Emergency Medicine | Admitting: Emergency Medicine

## 2015-09-19 ENCOUNTER — Encounter (HOSPITAL_COMMUNITY): Payer: Self-pay

## 2015-09-19 DIAGNOSIS — Z8709 Personal history of other diseases of the respiratory system: Secondary | ICD-10-CM | POA: Diagnosis not present

## 2015-09-19 DIAGNOSIS — Z7984 Long term (current) use of oral hypoglycemic drugs: Secondary | ICD-10-CM | POA: Insufficient documentation

## 2015-09-19 DIAGNOSIS — K5710 Diverticulosis of small intestine without perforation or abscess without bleeding: Secondary | ICD-10-CM | POA: Diagnosis not present

## 2015-09-19 DIAGNOSIS — Z8659 Personal history of other mental and behavioral disorders: Secondary | ICD-10-CM | POA: Diagnosis not present

## 2015-09-19 DIAGNOSIS — Z791 Long term (current) use of non-steroidal anti-inflammatories (NSAID): Secondary | ICD-10-CM | POA: Insufficient documentation

## 2015-09-19 DIAGNOSIS — E1140 Type 2 diabetes mellitus with diabetic neuropathy, unspecified: Secondary | ICD-10-CM | POA: Diagnosis not present

## 2015-09-19 DIAGNOSIS — Z87891 Personal history of nicotine dependence: Secondary | ICD-10-CM | POA: Diagnosis not present

## 2015-09-19 DIAGNOSIS — Z9889 Other specified postprocedural states: Secondary | ICD-10-CM | POA: Insufficient documentation

## 2015-09-19 DIAGNOSIS — Z8669 Personal history of other diseases of the nervous system and sense organs: Secondary | ICD-10-CM | POA: Diagnosis not present

## 2015-09-19 DIAGNOSIS — Z91040 Latex allergy status: Secondary | ICD-10-CM | POA: Insufficient documentation

## 2015-09-19 DIAGNOSIS — Z9049 Acquired absence of other specified parts of digestive tract: Secondary | ICD-10-CM | POA: Diagnosis not present

## 2015-09-19 DIAGNOSIS — K5712 Diverticulitis of small intestine without perforation or abscess without bleeding: Secondary | ICD-10-CM | POA: Insufficient documentation

## 2015-09-19 DIAGNOSIS — Z79899 Other long term (current) drug therapy: Secondary | ICD-10-CM | POA: Diagnosis not present

## 2015-09-19 DIAGNOSIS — K5730 Diverticulosis of large intestine without perforation or abscess without bleeding: Secondary | ICD-10-CM | POA: Diagnosis not present

## 2015-09-19 DIAGNOSIS — I10 Essential (primary) hypertension: Secondary | ICD-10-CM | POA: Diagnosis not present

## 2015-09-19 DIAGNOSIS — Z90710 Acquired absence of both cervix and uterus: Secondary | ICD-10-CM | POA: Insufficient documentation

## 2015-09-19 DIAGNOSIS — R1032 Left lower quadrant pain: Secondary | ICD-10-CM | POA: Diagnosis present

## 2015-09-19 LAB — URINALYSIS, ROUTINE W REFLEX MICROSCOPIC
Bilirubin Urine: NEGATIVE
Glucose, UA: NEGATIVE mg/dL
Hgb urine dipstick: NEGATIVE
Ketones, ur: NEGATIVE mg/dL
Leukocytes, UA: NEGATIVE
Nitrite: NEGATIVE
Protein, ur: NEGATIVE mg/dL
Specific Gravity, Urine: 1.005 (ref 1.005–1.030)
pH: 7 (ref 5.0–8.0)

## 2015-09-19 LAB — COMPREHENSIVE METABOLIC PANEL
ALT: 20 U/L (ref 14–54)
AST: 17 U/L (ref 15–41)
Albumin: 3.6 g/dL (ref 3.5–5.0)
Alkaline Phosphatase: 131 U/L — ABNORMAL HIGH (ref 38–126)
Anion gap: 9 (ref 5–15)
BUN: 9 mg/dL (ref 6–20)
CO2: 29 mmol/L (ref 22–32)
Calcium: 9.2 mg/dL (ref 8.9–10.3)
Chloride: 100 mmol/L — ABNORMAL LOW (ref 101–111)
Creatinine, Ser: 0.74 mg/dL (ref 0.44–1.00)
GFR calc Af Amer: >60 mL/min (ref >60)
GFR calc non Af Amer: >60 mL/min (ref >60)
Glucose, Bld: 195 mg/dL — ABNORMAL HIGH (ref 65–99)
Potassium: 3.1 mmol/L — ABNORMAL LOW (ref 3.5–5.1)
Sodium: 138 mmol/L (ref 135–145)
Total Bilirubin: 0.7 mg/dL (ref 0.3–1.2)
Total Protein: 7.5 g/dL (ref 6.5–8.1)

## 2015-09-19 LAB — CBC
HCT: 39.6 % (ref 36.0–46.0)
Hemoglobin: 13.3 g/dL (ref 12.0–15.0)
MCH: 27.8 pg (ref 26.0–34.0)
MCHC: 33.6 g/dL (ref 30.0–36.0)
MCV: 82.8 fL (ref 78.0–100.0)
Platelets: 189 10*3/uL (ref 150–400)
RBC: 4.78 MIL/uL (ref 3.87–5.11)
RDW: 13.3 % (ref 11.5–15.5)
WBC: 9.8 10*3/uL (ref 4.0–10.5)

## 2015-09-19 LAB — LIPASE, BLOOD: Lipase: 18 U/L (ref 11–51)

## 2015-09-19 MED ORDER — CIPROFLOXACIN HCL 500 MG PO TABS
500.0000 mg | ORAL_TABLET | Freq: Once | ORAL | Status: AC
  Administered 2015-09-19: 500 mg via ORAL
  Filled 2015-09-19: qty 1

## 2015-09-19 MED ORDER — CIPROFLOXACIN HCL 500 MG PO TABS: 500.0000 mg | ORAL_TABLET | Freq: Two times a day (BID) | ORAL | Status: DC

## 2015-09-19 MED ORDER — HYDROCODONE-ACETAMINOPHEN 5-325 MG PO TABS
1.0000 | ORAL_TABLET | Freq: Once | ORAL | Status: AC
  Administered 2015-09-19: 1 via ORAL
  Filled 2015-09-19: qty 1

## 2015-09-19 MED ORDER — IOHEXOL 300 MG/ML  SOLN
100.0000 mL | Freq: Once | INTRAMUSCULAR | Status: AC | PRN
  Administered 2015-09-19: 100 mL via INTRAVENOUS

## 2015-09-19 MED ORDER — MORPHINE SULFATE (PF) 4 MG/ML IV SOLN
4.0000 mg | Freq: Once | INTRAVENOUS | Status: AC
  Administered 2015-09-19: 4 mg via INTRAVENOUS
  Filled 2015-09-19: qty 1

## 2015-09-19 MED ORDER — SODIUM CHLORIDE 0.9 % IV BOLUS (SEPSIS)
1000.0000 mL | Freq: Once | INTRAVENOUS | Status: AC
  Administered 2015-09-19: 1000 mL via INTRAVENOUS

## 2015-09-19 MED ORDER — IOHEXOL 300 MG/ML  SOLN
50.0000 mL | Freq: Once | INTRAMUSCULAR | Status: AC | PRN
  Administered 2015-09-19: 50 mL via ORAL

## 2015-09-19 MED ORDER — METRONIDAZOLE 500 MG PO TABS
500.0000 mg | ORAL_TABLET | Freq: Once | ORAL | Status: AC
  Administered 2015-09-19: 500 mg via ORAL
  Filled 2015-09-19: qty 1

## 2015-09-19 MED ORDER — METRONIDAZOLE 500 MG PO TABS: 500.0000 mg | ORAL_TABLET | Freq: Two times a day (BID) | ORAL | Status: DC

## 2015-09-19 MED ORDER — POTASSIUM CHLORIDE CRYS ER 20 MEQ PO TBCR
40.0000 meq | EXTENDED_RELEASE_TABLET | Freq: Once | ORAL | Status: AC
  Administered 2015-09-19: 40 meq via ORAL
  Filled 2015-09-19: qty 2

## 2015-09-19 MED ORDER — HYDROCODONE-ACETAMINOPHEN 5-325 MG PO TABS: 1.0000 | ORAL_TABLET | ORAL | Status: DC | PRN

## 2015-09-19 MED ORDER — DIPHENHYDRAMINE HCL 50 MG/ML IJ SOLN
25.0000 mg | Freq: Once | INTRAMUSCULAR | Status: AC
  Administered 2015-09-19: 25 mg via INTRAVENOUS
  Filled 2015-09-19: qty 1

## 2015-09-19 NOTE — Discharge Instructions (Signed)
Take cipro and flagyl for a week.   Stay hydrated.   Take motrin for pain and vicodin for severe pain. Do NOT drive with vicodin.   Follow up with your doctor.   Return to ER if you have worse abdominal pain, vomiting, fevers.

## 2015-09-19 NOTE — ED Notes (Addendum)
Pt was told yesterday that she could have diverticulitis.  Having abdominal pain.  No n/v or fever.  Pt has hx of diverticulitis.  No med scripts given

## 2015-09-19 NOTE — ED Notes (Signed)
Pt aware of need for urine  

## 2015-09-19 NOTE — ED Notes (Signed)
MD at bedside.Delay in blood draw 

## 2015-09-19 NOTE — ED Provider Notes (Signed)
CSN: [id number]     Arrival date & time 09/19/15  0701 History   First MD Initiated Contact with Patient 09/19/15 [phone]     Chief Complaint  Patient presents with  . Abdominal Pain     (Consider location/radiation/quality/duration/timing/severity/associated sxs/prior Treatment) The history is provided by the patient.  Bonnie Gordon is a 60 y.o. female hx of HTN, diverticulitis, DM here with LLQ pain. Left lower quadrant pain for the last week or so. She states that it is constant and not associated with food. Denies any blood in her urine and denies any constipation. Denies any nausea or vomiting. She went to urgent care yesterday she was told she had diverticulitis but was not sent home with any medicines. As per the notes, patient seems to have undifferentiated left lower quadrant pain. Patient states that since yesterday her pain got worse and now is more diffuse. Continues to deny any fevers. Saw Dr. Elnoria Howard from GI previously.    Past Medical History  Diagnosis Date  . Hypertension   . Hypokalemia   . Diverticulitis   . Hypercholesteremia   . Vertigo     patient reported  . Chest pain 2009    echo  . Sleep apnea   . Complication of anesthesia     pt. states she is difficult to wake up  . PONV (postoperative nausea and vomiting)   . Diabetes mellitus     Type II  . History of bronchitis   . Shortness of breath dyspnea   . Anxiety   . Depression   . Bipolar disorder (HCC)   . GERD (gastroesophageal reflux disease)   . Headache   . Diabetic neuropathy [facility])    Past Surgical History  Procedure Laterality Date  . Cholecystectomy    . Abdominal hysterectomy    . Appendectomy    . Wisdom tooth extraction    . Cardiac catheterization  2009    normal cath  . Cardiac catheterization  2004    normal cath  . Tonsillectomy/adenoidectomy/turbinate reduction Bilateral 03/01/2015    Procedure: TONSILLECTOMY/TURBINATE REDUCTION;  Surgeon: Drema Halon, MD;  Location: [facility] OR;   Service: ENT;  Laterality: Bilateral;  . Uvulopalatopharyngoplasty Bilateral 03/01/2015    Procedure: UVULOPALATOPHARYNGOPLASTY (UPPP);  Surgeon: Drema Halon, MD;  Location: [facility] OR;  Service: ENT;  Laterality: Bilateral;   Family History  Problem Relation Age of Onset  . Diabetes Mother   . Gout Mother   . Hypertension Mother   . Arthritis Mother   . Heart disease Father   . Heart failure Sister   . Arthritis Sister   . Heart disease Maternal Grandmother   . Diabetes Maternal Grandmother   . Heart disease Paternal Grandmother   . Diabetes Paternal Grandmother   . Diabetes Sister   . Bursitis Sister   . Crohn's disease Sister    Social History  Substance Use Topics  . Smoking status: Former Smoker    Quit date: 08/07/1982  . Smokeless tobacco: Never Used  . Alcohol Use: Yes     Comment: occasional / social   OB History    No data available     Review of Systems  Gastrointestinal: Positive for abdominal pain.  All other systems reviewed and are negative.     Allergies  Other; Asa; and Latex  Home Medications   Prior to Admission medications   Medication Sig Start Date End Date Taking? Authorizing Provider  ACCU-CHEK AVIVA PLUS test strip  04/16/14  Historical Provider, MD  ACCU-CHEK SOFTCLIX LANCETS lancets  04/16/14   Historical Provider, MD  amLODipine (NORVASC) 5 MG tablet TAKE 1 TABLET BY MOUTH EVERY DAY 07/19/15   Lennette Bihari, MD  aspirin EC 81 MG tablet Take 81 mg by mouth daily.    Historical Provider, MD  BOOSTRIX 5-2.5-18.5 injection TO BE ADMINISTERED BY PHARMACIST FOR IMMUNIZATION 07/14/15   Historical Provider, MD  glimepiride (AMARYL) 4 MG tablet Take 4 mg by mouth daily with breakfast.    Historical Provider, MD  hydrochlorothiazide (MICROZIDE) 12.5 MG capsule Take 1 capsule (12.5 mg total) by mouth daily. 08/30/15   Lennette Bihari, MD  meloxicam (MOBIC) 7.5 MG tablet Take 7.5 mg by mouth daily.    Historical Provider, MD  Multiple  Vitamins-Minerals (MULTIVITAMIN WITH MINERALS) tablet Take 1 tablet by mouth daily.    Historical Provider, MD  VENTOLIN HFA 108 (90 BASE) MCG/ACT inhaler Inhale 2 puffs into the lungs 4 (four) times daily. 02/12/15   Historical Provider, MD   BP 149/94 mmHg  Pulse 92  Temp(Src) 98.3 F (36.8 C) (Oral)  Resp 16  SpO2 96% Physical Exam  Constitutional: She is oriented to person, place, and time.  Uncomfortable   HENT:  Head: Normocephalic.  Mouth/Throat: Oropharynx is clear and moist.  Eyes: Conjunctivae are normal. Pupils are equal, round, and reactive to light.  Neck: Normal range of motion.  Cardiovascular: Normal rate, regular rhythm and normal heart sounds.   Pulmonary/Chest: Effort normal and breath sounds normal. No respiratory distress. She has no wheezes. She has no rales.  Abdominal: Soft. Bowel sounds are normal.  + LLQ tenderness, no rebound   Musculoskeletal: Normal range of motion. She exhibits no edema or tenderness.  Neurological: She is alert and oriented to person, place, and time. No cranial nerve deficit. Coordination normal.  Skin: Skin is warm and dry.  Psychiatric: She has a normal mood and affect. Her behavior is normal. Judgment and thought content normal.  Nursing note and vitals reviewed.   ED Course  Procedures (including critical care time) Labs Review Labs Reviewed  COMPREHENSIVE METABOLIC PANEL - Abnormal; Notable for the following:    Potassium 3.1 (*)    Chloride 100 (*)    Glucose, Bld 195 (*)    Alkaline Phosphatase 131 (*)    All other components within normal limits  URINALYSIS, ROUTINE W REFLEX MICROSCOPIC (NOT AT [facility]) - Abnormal; Notable for the following:    APPearance CLOUDY (*)    All other components within normal limits  LIPASE, BLOOD  CBC    Imaging Review Ct Abdomen Pelvis W Contrast  09/19/2015  CLINICAL DATA:  Lower abdominal and back pain for the past 3 days, left greater than right. History of diverticulitis. Recent  diagnosis of urinary tract infection. EXAM: CT ABDOMEN AND PELVIS WITH CONTRAST TECHNIQUE: Multidetector CT imaging of the abdomen and pelvis was performed using the standard protocol following bolus administration of intravenous contrast. CONTRAST:  50mL OMNIPAQUE IOHEXOL 300 MG/ML SOLN, OMNIPAQUE IOHEXOL 300 MG/ML SOLN COMPARISON:  09/12/2013; 12/27/2012 FINDINGS: Extensive colonic diverticulosis. Additionally, there is ill-defined mesenteric stranding surrounding the sigmoid colon within the left lower abdominal quadrant with associated bowel wall thickening, constellation of findings worrisome for a area of acute uncomplicated diverticulitis. No evidence of perforation or definable/drainable fluid collection. Enteric contrast extends to the level of the mid/distal small bowel. No evidence of enteric obstruction. No pneumoperitoneum, pneumatosis or portal venous gas. Normal hepatic contour. There is diffuse decreased  attenuation of the hepatic parenchyma on this postcontrast examination. There is an approximately 1 cm hypo attenuating nonenhancing cyst within the central aspect of the medial segments of the left lobe liver. Post cholecystectomy. No intra extrahepatic biliary duct dilatation. No ascites. There is symmetric enhancement and excretion of the bilateral kidneys. Note is made of an an approximately 0.7 cm hypo attenuating lesion within the inferior pole the left kidney (image 26, series 7) which is too small to accurately characterize though morphologically similar to the 12/2012 examination favored to represent a renal cyst. Note is made of a punctate (approximately 0.5 cm) nonobstructing stone within the superior pole of the right kidney (image 65, series 5). Note is again made of an exaggerated horizontal lie of the right kidney. No left-sided renal stones. No renal stones are seen along the expected course of either ureter or the urinary bladder. There is mild diffuse thickening of the urinary  bladder wall, potentially accentuated due to underdistention. Note is made of a prominent phlebolith within the right gonadal vein. Multiple phleboliths are seen within the lower pelvis bilaterally. No urinary obstruction or perinephric stranding. Normal appearance of the bilateral adrenal glands, pancreas and spleen. Normal caliber of the abdominal aorta. The major branch vessels of the abdominal aorta appear widely patent on this non CTA examination. Scattered mesenteric lymph nodes centered within the left lower abdominal quadrant are numerous though individually not enlarged by size criteria with index mesenteric lymph node measuring 0.6 cm in greatest short axis diameter (55, series 2). No bulky retroperitoneal, mesenteric, pelvic or inguinal lymphadenopathy. Post hysterectomy. No discrete adnexal lesion. There is a trace amount of likely reactive free fluid seen within the pelvic cul-de-sac. Limited visualization of the lower thorax demonstrates minimal dependent subpleural ground-glass atelectasis. No focal airspace opacities. No pleural effusion. Cardiomegaly.  No pericardial effusion. No acute or aggressive osseous abnormalities. Mild multilevel lumbar spine DDD. Mild scoliotic curvature of the lumbar spine, presumably physiologic. Regional soft tissues appear normal. IMPRESSION: 1. Extensive colonic diverticulosis with short-segment area of superimposed acute uncomplicated diverticulitis involving the sigmoid colon with the left lower abdominal quadrant. No evidence of perforation or definable/drainable fluid collection. No evidence of enteric obstruction. 2. Punctate (approximately 5 mm) nonobstructing stone within the superior pole of the right kidney. 3. Suspected hepatic steatosis on this noncontrast examination. Correlation with LFTs is recommended. Electronically Signed   By: Simonne Come M.D.   On: 09/19/2015 09:42   I have personally reviewed and evaluated these images and lab results as part of  my medical decision-making.   EKG Interpretation None      MDM   Final diagnoses:  None    Bonnie Gordon is a 60 y.o. female here with LLQ pain. Likely diverticulitis vs UTI vs stone. UA at urgent care yesterday showed no hematuria or UTI. Will get labs, CT ab/pel, UA.   10:40 AM CT showed small area of uncomplicated diverticulitis. Afebrile, not septic. Pain controlled. Will dc home with vicodin, cipro, flagyl.    Richardean Canal, MD 09/19/15 1040

## 2015-09-20 ENCOUNTER — Other Ambulatory Visit: Payer: Self-pay | Admitting: *Deleted

## 2015-09-20 DIAGNOSIS — E785 Hyperlipidemia, unspecified: Secondary | ICD-10-CM

## 2015-09-20 DIAGNOSIS — Z79899 Other long term (current) drug therapy: Secondary | ICD-10-CM

## 2015-09-20 DIAGNOSIS — R1032 Left lower quadrant pain: Secondary | ICD-10-CM | POA: Diagnosis not present

## 2015-09-20 DIAGNOSIS — K5732 Diverticulitis of large intestine without perforation or abscess without bleeding: Secondary | ICD-10-CM | POA: Diagnosis not present

## 2015-09-20 MED ORDER — ROSUVASTATIN CALCIUM 40 MG PO TABS: 40.0000 mg | ORAL_TABLET | Freq: Every day | ORAL | Status: DC

## 2015-09-20 NOTE — Telephone Encounter (Signed)
Patient notified of lab results and recommendations.v voiced understanding.

## 2015-09-20 NOTE — Telephone Encounter (Signed)
-----   Message from Lennette Bihari, MD sent at 08/31/2015  7:23 AM EST ----- Needs improved DM control; copy to primary; if pt is on crestor 20 mg, increase to 40 mg

## 2015-09-24 ENCOUNTER — Emergency Department (HOSPITAL_COMMUNITY)
Admission: EM | Admit: 2015-09-24 | Discharge: 2015-09-24 | Disposition: A | Discharge status: Home / Self Care | Payer: Medicare Other | Attending: Physician Assistant | Admitting: Physician Assistant

## 2015-09-24 ENCOUNTER — Encounter (HOSPITAL_COMMUNITY): Payer: Self-pay | Admitting: Emergency Medicine

## 2015-09-24 DIAGNOSIS — Z7982 Long term (current) use of aspirin: Secondary | ICD-10-CM | POA: Insufficient documentation

## 2015-09-24 DIAGNOSIS — I10 Essential (primary) hypertension: Secondary | ICD-10-CM | POA: Insufficient documentation

## 2015-09-24 DIAGNOSIS — S0092XA Blister (nonthermal) of unspecified part of head, initial encounter: Secondary | ICD-10-CM | POA: Diagnosis not present

## 2015-09-24 DIAGNOSIS — Z8669 Personal history of other diseases of the nervous system and sense organs: Secondary | ICD-10-CM | POA: Diagnosis not present

## 2015-09-24 DIAGNOSIS — Z79899 Other long term (current) drug therapy: Secondary | ICD-10-CM | POA: Diagnosis not present

## 2015-09-24 DIAGNOSIS — L988 Other specified disorders of the skin and subcutaneous tissue: Secondary | ICD-10-CM | POA: Insufficient documentation

## 2015-09-24 DIAGNOSIS — Z8709 Personal history of other diseases of the respiratory system: Secondary | ICD-10-CM | POA: Insufficient documentation

## 2015-09-24 DIAGNOSIS — Z91040 Latex allergy status: Secondary | ICD-10-CM | POA: Diagnosis not present

## 2015-09-24 DIAGNOSIS — Z791 Long term (current) use of non-steroidal anti-inflammatories (NSAID): Secondary | ICD-10-CM | POA: Insufficient documentation

## 2015-09-24 DIAGNOSIS — Z8659 Personal history of other mental and behavioral disorders: Secondary | ICD-10-CM | POA: Diagnosis not present

## 2015-09-24 DIAGNOSIS — E7800 Pure hypercholesterolemia, unspecified: Secondary | ICD-10-CM | POA: Diagnosis not present

## 2015-09-24 DIAGNOSIS — Z7984 Long term (current) use of oral hypoglycemic drugs: Secondary | ICD-10-CM | POA: Insufficient documentation

## 2015-09-24 DIAGNOSIS — E1140 Type 2 diabetes mellitus with diabetic neuropathy, unspecified: Secondary | ICD-10-CM | POA: Insufficient documentation

## 2015-09-24 DIAGNOSIS — Z87891 Personal history of nicotine dependence: Secondary | ICD-10-CM | POA: Diagnosis not present

## 2015-09-24 DIAGNOSIS — Z9889 Other specified postprocedural states: Secondary | ICD-10-CM | POA: Diagnosis not present

## 2015-09-24 DIAGNOSIS — K219 Gastro-esophageal reflux disease without esophagitis: Secondary | ICD-10-CM | POA: Diagnosis not present

## 2015-09-24 MED ORDER — FLUTICASONE PROPIONATE 50 MCG/ACT NA SUSP: 2.0000 | Freq: Every day | NASAL | Status: DC

## 2015-09-24 MED ORDER — MUPIROCIN 2 % EX OINT: 1.0000 "application " | TOPICAL_OINTMENT | Freq: Two times a day (BID) | CUTANEOUS | Status: DC

## 2015-09-24 MED ORDER — CETIRIZINE HCL 10 MG PO TABS: 10.0000 mg | ORAL_TABLET | Freq: Every day | ORAL | Status: DC

## 2015-09-24 NOTE — ED Provider Notes (Signed)
CSN: [id number]     Arrival date & time 09/24/15  2018 History  By signing my name below, I, Bonnie Gordon, attest that this documentation has been prepared under the direction and in the presence of  [name], PA-C. Electronically Signed: Linus Gordon, ED Scribe. 09/26/2015. 10:20 PM.   Chief Complaint  Patient presents with  . Blister   The history is provided by the patient. No language interpreter was used.   HPI Comments: Bonnie Gordon is a 60 y.o. female with PMHx of diverticulitis. HTN, DM, and HLD who presents to the Emergency Department complaining of an achy blister behind the right earlobe that began about 5 hours ago. Pt reports she went to a hairstylist 5 hours ago but did not have it then.  She did not get burned or scratched. However, some time after getting her hair done, she noted the blister as she was fixing her hair. Pt also reports possible swollen lymph nodes billaterally in her neck. Pt denies any fever, chills, ear pain, hearing changnes, rash or any other symptoms at this time. Pt is on abx for diverticulitis.   Past Medical History  Diagnosis Date  . Hypertension   . Hypokalemia   . Diverticulitis   . Hypercholesteremia   . Vertigo     patient reported  . Chest pain 2009    echo  . Sleep apnea   . Complication of anesthesia     pt. states she is difficult to wake up  . PONV (postoperative nausea and vomiting)   . Diabetes mellitus     Type II  . History of bronchitis   . Shortness of breath dyspnea   . Anxiety   . Depression   . Bipolar disorder (HCC)   . GERD (gastroesophageal reflux disease)   . Headache   . Diabetic neuropathy [facility])    Past Surgical History  Procedure Laterality Date  . Cholecystectomy    . Abdominal hysterectomy    . Appendectomy    . Wisdom tooth extraction    . Cardiac catheterization  2009    normal cath  . Cardiac catheterization  2004    normal cath  . Tonsillectomy/adenoidectomy/turbinate reduction Bilateral  03/01/2015    Procedure: TONSILLECTOMY/TURBINATE REDUCTION;  Surgeon: Drema Halon, MD;  Location: [facility] OR;  Service: ENT;  Laterality: Bilateral;  . Uvulopalatopharyngoplasty Bilateral 03/01/2015    Procedure: UVULOPALATOPHARYNGOPLASTY (UPPP);  Surgeon: Drema Halon, MD;  Location: [facility] OR;  Service: ENT;  Laterality: Bilateral;   Family History  Problem Relation Age of Onset  . Diabetes Mother   . Gout Mother   . Hypertension Mother   . Arthritis Mother   . Heart disease Father   . Heart failure Sister   . Arthritis Sister   . Heart disease Maternal Grandmother   . Diabetes Maternal Grandmother   . Heart disease Paternal Grandmother   . Diabetes Paternal Grandmother   . Diabetes Sister   . Bursitis Sister   . Crohn's disease Sister    Social History  Substance Use Topics  . Smoking status: Former Smoker    Quit date: 08/07/1982  . Smokeless tobacco: Never Used  . Alcohol Use: Yes     Comment: occasional / social   OB History    No data available     Review of Systems  Constitutional: Negative for fever and chills.  HENT: Negative for ear pain.   Skin: Negative for rash.       + blister  All other systems reviewed and are negative.  Allergies  Other; Asa; and Latex  Home Medications   Prior to Admission medications   Medication Sig Start Date End Date Taking? Authorizing Provider  ACCU-CHEK AVIVA PLUS test strip  04/16/14   Historical Provider, MD  ACCU-CHEK SOFTCLIX LANCETS lancets  04/16/14   Historical Provider, MD  acidophilus (RISAQUAD) CAPS capsule Take 1 capsule by mouth daily.    Historical Provider, MD  amLODipine (NORVASC) 5 MG tablet TAKE 1 TABLET BY MOUTH EVERY DAY 07/19/15   Lennette Bihari, MD  aspirin EC 81 MG tablet Take 81 mg by mouth daily.    Historical Provider, MD  BOOSTRIX 5-2.5-18.5 injection TO BE ADMINISTERED BY PHARMACIST FOR IMMUNIZATION 07/14/15   Historical Provider, MD  cetirizine (ZYRTEC ALLERGY) 10 MG tablet Take 1 tablet  (10 mg total) by mouth daily. 09/24/15   [name], PA-C  ciprofloxacin (CIPRO) 500 MG tablet Take 1 tablet (500 mg total) by mouth 2 (two) times daily. One po bid x 7 days 09/19/15   Richardean Canal, MD  fluticasone [facility]) 50 MCG/ACT nasal spray Place 2 sprays into both nostrils daily. 09/24/15   [name], PA-C  glimepiride (AMARYL) 4 MG tablet Take 4 mg by mouth daily with breakfast.    Historical Provider, MD  hydrochlorothiazide (MICROZIDE) 12.5 MG capsule Take 1 capsule (12.5 mg total) by mouth daily. 08/30/15   Lennette Bihari, MD  HYDROcodone-acetaminophen (NORCO/VICODIN) 5-325 MG tablet Take 1-2 tablets by mouth every 4 (four) hours as needed. 09/19/15   Richardean Canal, MD  magnesium oxide (MAG-OX) 400 MG tablet Take 400 mg by mouth daily.    Historical Provider, MD  meloxicam (MOBIC) 7.5 MG tablet Take 7.5 mg by mouth daily.    Historical Provider, MD  metroNIDAZOLE (FLAGYL) 500 MG tablet Take 1 tablet (500 mg total) by mouth 2 (two) times daily. One po bid x 7 days 09/19/15   Richardean Canal, MD  Multiple Vitamins-Minerals (MULTIVITAMIN WITH MINERALS) tablet Take 1 tablet by mouth daily.    Historical Provider, MD  mupirocin ointment (BACTROBAN) 2 % Place 1 application into the nose 2 (two) times daily. 09/24/15   [name], PA-C  rosuvastatin (CRESTOR) 40 MG tablet Take 1 tablet (40 mg total) by mouth daily. 09/20/15   Lennette Bihari, MD  VENTOLIN HFA 108 (90 BASE) MCG/ACT inhaler Inhale 2 puffs into the lungs every 4 (four) hours as needed for wheezing or shortness of breath.  02/12/15   Historical Provider, MD   BP 143/90 mmHg  Pulse 78  Temp(Src) 98.5 F (36.9 C) (Oral)  Resp 18  Ht  (1.676 m)  Wt 83.462 kg  BMI 29.71 kg/m2  SpO2 100%   Physical Exam  Constitutional: She is oriented to person, place, and time. She appears well-developed and well-nourished. No distress.  HENT:  Head: Normocephalic and atraumatic.  Right Ear: Tympanic membrane, external ear and ear canal normal. No  mastoid tenderness. Tympanic membrane is not erythematous.  Left Ear: Tympanic membrane, external ear and ear canal normal. No mastoid tenderness. Tympanic membrane is not erythematous.  Ears:  Nose: Nose normal.  Mouth/Throat: Uvula is midline, oropharynx is clear and moist and mucous membranes are normal. No oral lesions. No oropharyngeal exudate, posterior oropharyngeal edema or posterior oropharyngeal erythema.   right postauricular superficial vesical, 1 cm, mild circumfrential erythema no surrounding erythema or induration  Eyes: Conjunctivae and EOM are normal. Pupils are equal, round, and reactive  to light. Right eye exhibits no discharge. Left eye exhibits no discharge. No scleral icterus.  Neck: Normal range of motion. Neck supple. No JVD present. No tracheal deviation present.  Cardiovascular: Normal rate and regular rhythm.   Pulmonary/Chest: Effort normal and breath sounds normal. No stridor. No respiratory distress.  Musculoskeletal: Normal range of motion. She exhibits no edema.  Lymphadenopathy:       Head (right side): No preauricular, no posterior auricular and no occipital adenopathy present.       Head (left side): No preauricular, no posterior auricular and no occipital adenopathy present.    She has no cervical adenopathy.  Neurological: She is alert and oriented to person, place, and time. She exhibits normal muscle tone. Coordination normal.  Skin: Skin is warm and dry. No rash noted. She is not diaphoretic. No erythema. No pallor.  Psychiatric: She has a normal mood and affect. Her behavior is normal. Judgment and thought content normal.  Nursing note and vitals reviewed.   ED Course  Procedures   INCISION AND DRAINAGE PROCEDURE NOTE: Patient identification was confirmed and verbal consent was obtained. This procedure was performed by  [name], PA-C at 10:20 PM. Site: right postauricular superficial vesical, 1 cm Sterile procedures observed Needle size: 25  gauge Drainage: clear serous fluid Complexity: simple Site anesthetized, incision made over site, wound drained and explored loculations, rinsed with copious amounts of normal saline, Pt tolerated procedure well without complications.  Instructions for care discussed verbally and pt provided with additional written instructions for homecare and f/u.  DIAGNOSTIC STUDIES: Oxygen Saturation is 100% on room air, normal by my interpretation.    COORDINATION OF CARE: 10:00 PM Discussed treatment plan including Rx for Bactroban with pt at bedside and pt agreed to plan.  MDM   Single discrete circular blister behind pt's right ear, onset today, no other associated sx, no trauma. Culture taken of vesicle fluid, antibiotic ointment and dressing applied.  No constitutional sx, no signs of cellulitis. Does not appear consistent with shingles.   Wound care reviewed with the pt.  She was discharged in good condition.  She understands she will be contacted if abx tx is indicated.  Final diagnoses:  Blister head, initial encounter   I personally performed the services described in this documentation, which was scribed in my presence. The recorded information has been reviewed and is accurate.      [name], PA-C 09/26/15 2258  Courteney Randall An, MD 09/26/15 2352

## 2015-09-24 NOTE — Discharge Instructions (Signed)
Blisters A blister is a fluid-filled sac that forms between layers of skin. Blisters often form in areas where skin rubs against other skin or rubs against something else. The most common areas for blisters are the hands and feet. CAUSES A blister can be caused by:  An injury.  A burn.  An allergic reaction.  An infection.  Exposure to irritating chemicals.  Friction. Friction blisters often result from:  Sports.  Repetitive activities.  Shoes that are too tight or too loose. SIGNS AND SYMPTOMS A blister is often round and looks like a bump. It may itch or be painful to the touch. The liquid in a blister is clear or bloody. Before a blister forms, the skin may become red, feel warm, itch, or be painful to the touch. DIAGNOSIS A blister can usually be diagnosed from its appearance. TREATMENT Treatment involves protecting the area where the blister has formed until the skin has healed. If something is likely to rub against the blister, apply a bandage (dressing) with a hole in the middle over the blister. Most blisters break open, dry up, and go away on their own within 10 days. Rarely, blisters that are very painful may be drained before they break open on their own. Draining of a blister should only be done by a health care provider under sterile conditions. HOME CARE INSTRUCTIONS  Protect the area where the blister has formed as directed by your health care provider.  Do not open or pop your blister, because it could become infected.  If the blister is very painful, ask your health care provider whether you should have it drained.  If the blister breaks open on its own:  Do not remove the loose skin that is over the blister.  Wash the blister area with soap and water every day.  After washing the blister area, you may apply an antibiotic cream or ointment and cover the area with a bandage. PREVENTION Taking these steps can help to prevent blisters that are caused by  friction:  Wear comfortable shoes that fit well.  Always wear socks with shoes.  Wear extra socks or use tape, bandages, or pads over blister-prone areas as needed.  Wear protective gear, such as gloves, when participating in sports or activities that can cause blisters.  Use powders as needed to keep your feet dry. SEEK MEDICAL CARE IF:  You have increased redness, swelling, or pain in the blister area.  A puslike discharge is coming from the blister area.  You have a fever.  You have chills.   This information is not intended to replace advice given to you by your health care provider. Make sure you discuss any questions you have with your health care provider.   Document Released: 08/31/2004 Document Revised: 08/14/2014 Document Reviewed: 02/21/2014 Elsevier Interactive Patient Education 2016 [employer]  Rash A rash is a change in the color or texture of the skin. There are many different types of rashes. You may have other problems that accompany your rash. CAUSES   Infections.  Allergic reactions. This can include allergies to pets or foods.  Certain medicines.  Exposure to certain chemicals, soaps, or cosmetics.  Heat.  Exposure to poisonous plants.  Tumors, both cancerous and noncancerous. SYMPTOMS   Redness.  Scaly skin.  Itchy skin.  Dry or cracked skin.  Bumps.  Blisters.  Pain. DIAGNOSIS  Your caregiver may do a physical exam to determine what type of rash you have. A skin sample (biopsy) may  be taken and examined under a microscope. TREATMENT  Treatment depends on the type of rash you have. Your caregiver may prescribe certain medicines. For serious conditions, you may need to see a skin doctor (dermatologist). HOME CARE INSTRUCTIONS   Avoid the substance that caused your rash.  Do not scratch your rash. This can cause infection.  You may take cool baths to help stop itching.  Only take over-the-counter or prescription medicines as  directed by your caregiver.  Keep all follow-up appointments as directed by your caregiver. SEEK IMMEDIATE MEDICAL CARE IF:  You have increasing pain, swelling, or redness.  You have a fever.  You have new or severe symptoms.  You have body aches, diarrhea, or vomiting.  Your rash is not better after 3 days. MAKE SURE YOU:  Understand these instructions.  Will watch your condition.  Will get help right away if you are not doing well or get worse.   This information is not intended to replace advice given to you by your health care provider. Make sure you discuss any questions you have with your health care provider.   Document Released: 07/14/2002 Document Revised: 08/14/2014 Document Reviewed: 12/09/2014 Elsevier Interactive Patient Education [employer]

## 2015-09-24 NOTE — ED Notes (Signed)
Pt c/o fluid filled blister behind R earlobe onset today. Pt did go to hair stylist yesterday. Pt does feel like she has some swelling in her lymph nodes.

## 2015-09-27 LAB — WOUND CULTURE
Culture: NO GROWTH
Special Requests: NORMAL

## 2015-09-28 DIAGNOSIS — E878 Other disorders of electrolyte and fluid balance, not elsewhere classified: Secondary | ICD-10-CM | POA: Diagnosis not present

## 2015-10-14 ENCOUNTER — Other Ambulatory Visit: Payer: Self-pay

## 2015-10-25 DIAGNOSIS — E119 Type 2 diabetes mellitus without complications: Secondary | ICD-10-CM | POA: Diagnosis not present

## 2015-10-25 DIAGNOSIS — E782 Mixed hyperlipidemia: Secondary | ICD-10-CM | POA: Diagnosis not present

## 2015-10-25 DIAGNOSIS — I10 Essential (primary) hypertension: Secondary | ICD-10-CM | POA: Diagnosis not present

## 2015-10-29 DIAGNOSIS — N390 Urinary tract infection, site not specified: Secondary | ICD-10-CM | POA: Diagnosis not present

## 2015-10-29 DIAGNOSIS — N771 Vaginitis, vulvitis and vulvovaginitis in diseases classified elsewhere: Secondary | ICD-10-CM | POA: Diagnosis not present

## 2015-11-03 DIAGNOSIS — E119 Type 2 diabetes mellitus without complications: Secondary | ICD-10-CM | POA: Diagnosis not present

## 2015-11-03 DIAGNOSIS — I10 Essential (primary) hypertension: Secondary | ICD-10-CM | POA: Diagnosis not present

## 2015-11-04 ENCOUNTER — Other Ambulatory Visit (HOSPITAL_BASED_OUTPATIENT_CLINIC_OR_DEPARTMENT_OTHER): Payer: Self-pay | Admitting: Family Medicine

## 2015-11-04 ENCOUNTER — Other Ambulatory Visit: Payer: Self-pay | Admitting: Cardiovascular Disease

## 2015-11-04 DIAGNOSIS — Z1231 Encounter for screening mammogram for malignant neoplasm of breast: Secondary | ICD-10-CM

## 2015-11-04 NOTE — Telephone Encounter (Signed)
Rx refill sent to pharmacy. 

## 2015-11-08 ENCOUNTER — Ambulatory Visit (HOSPITAL_BASED_OUTPATIENT_CLINIC_OR_DEPARTMENT_OTHER)
Admission: RE | Admit: 2015-11-08 | Discharge: 2015-11-08 | Disposition: A | Discharge status: Home / Self Care | Payer: Medicare Other | Source: Ambulatory Visit | Attending: Family Medicine | Admitting: Family Medicine

## 2015-11-08 DIAGNOSIS — Z1231 Encounter for screening mammogram for malignant neoplasm of breast: Secondary | ICD-10-CM

## 2015-12-06 DIAGNOSIS — M545 Low back pain: Secondary | ICD-10-CM | POA: Diagnosis not present

## 2015-12-06 DIAGNOSIS — E119 Type 2 diabetes mellitus without complications: Secondary | ICD-10-CM | POA: Diagnosis not present

## 2015-12-06 DIAGNOSIS — B373 Candidiasis of vulva and vagina: Secondary | ICD-10-CM | POA: Diagnosis not present

## 2015-12-06 DIAGNOSIS — I10 Essential (primary) hypertension: Secondary | ICD-10-CM | POA: Diagnosis not present

## 2015-12-21 DIAGNOSIS — Z87891 Personal history of nicotine dependence: Secondary | ICD-10-CM | POA: Diagnosis not present

## 2015-12-21 DIAGNOSIS — Z833 Family history of diabetes mellitus: Secondary | ICD-10-CM | POA: Diagnosis not present

## 2015-12-21 DIAGNOSIS — Z79899 Other long term (current) drug therapy: Secondary | ICD-10-CM | POA: Diagnosis not present

## 2015-12-21 DIAGNOSIS — R51 Headache: Secondary | ICD-10-CM | POA: Diagnosis not present

## 2015-12-21 DIAGNOSIS — E860 Dehydration: Secondary | ICD-10-CM | POA: Diagnosis not present

## 2015-12-21 DIAGNOSIS — Z8249 Family history of ischemic heart disease and other diseases of the circulatory system: Secondary | ICD-10-CM | POA: Diagnosis not present

## 2015-12-21 DIAGNOSIS — E1165 Type 2 diabetes mellitus with hyperglycemia: Secondary | ICD-10-CM | POA: Diagnosis not present

## 2015-12-21 DIAGNOSIS — Z7984 Long term (current) use of oral hypoglycemic drugs: Secondary | ICD-10-CM | POA: Diagnosis not present

## 2015-12-21 DIAGNOSIS — I10 Essential (primary) hypertension: Secondary | ICD-10-CM | POA: Diagnosis not present

## 2016-01-07 DIAGNOSIS — M25511 Pain in right shoulder: Secondary | ICD-10-CM | POA: Diagnosis not present

## 2016-01-07 DIAGNOSIS — M79601 Pain in right arm: Secondary | ICD-10-CM | POA: Diagnosis not present

## 2016-01-13 DIAGNOSIS — M545 Low back pain: Secondary | ICD-10-CM | POA: Diagnosis not present

## 2016-01-13 DIAGNOSIS — I10 Essential (primary) hypertension: Secondary | ICD-10-CM | POA: Diagnosis not present

## 2016-01-13 DIAGNOSIS — E782 Mixed hyperlipidemia: Secondary | ICD-10-CM | POA: Diagnosis not present

## 2016-01-13 DIAGNOSIS — E119 Type 2 diabetes mellitus without complications: Secondary | ICD-10-CM | POA: Diagnosis not present

## 2016-01-15 ENCOUNTER — Emergency Department (HOSPITAL_COMMUNITY)
Admission: EM | Admit: 2016-01-15 | Discharge: 2016-01-16 | Disposition: A | Discharge status: Home / Self Care | Payer: Medicare Other | Attending: Emergency Medicine | Admitting: Emergency Medicine

## 2016-01-15 ENCOUNTER — Encounter (HOSPITAL_COMMUNITY): Payer: Self-pay | Admitting: Emergency Medicine

## 2016-01-15 DIAGNOSIS — E7800 Pure hypercholesterolemia, unspecified: Secondary | ICD-10-CM | POA: Insufficient documentation

## 2016-01-15 DIAGNOSIS — Z7982 Long term (current) use of aspirin: Secondary | ICD-10-CM | POA: Diagnosis not present

## 2016-01-15 DIAGNOSIS — Z79899 Other long term (current) drug therapy: Secondary | ICD-10-CM | POA: Diagnosis not present

## 2016-01-15 DIAGNOSIS — R51 Headache: Secondary | ICD-10-CM | POA: Diagnosis not present

## 2016-01-15 DIAGNOSIS — G4733 Obstructive sleep apnea (adult) (pediatric): Secondary | ICD-10-CM | POA: Diagnosis not present

## 2016-01-15 DIAGNOSIS — E1165 Type 2 diabetes mellitus with hyperglycemia: Secondary | ICD-10-CM | POA: Insufficient documentation

## 2016-01-15 DIAGNOSIS — I10 Essential (primary) hypertension: Secondary | ICD-10-CM | POA: Diagnosis not present

## 2016-01-15 DIAGNOSIS — R519 Headache, unspecified: Secondary | ICD-10-CM

## 2016-01-15 DIAGNOSIS — R739 Hyperglycemia, unspecified: Secondary | ICD-10-CM | POA: Diagnosis present

## 2016-01-15 DIAGNOSIS — R0681 Apnea, not elsewhere classified: Secondary | ICD-10-CM | POA: Diagnosis not present

## 2016-01-15 DIAGNOSIS — Z7984 Long term (current) use of oral hypoglycemic drugs: Secondary | ICD-10-CM | POA: Diagnosis not present

## 2016-01-15 DIAGNOSIS — Z91040 Latex allergy status: Secondary | ICD-10-CM | POA: Insufficient documentation

## 2016-01-15 DIAGNOSIS — E1140 Type 2 diabetes mellitus with diabetic neuropathy, unspecified: Secondary | ICD-10-CM | POA: Diagnosis not present

## 2016-01-15 DIAGNOSIS — Z87891 Personal history of nicotine dependence: Secondary | ICD-10-CM | POA: Diagnosis not present

## 2016-01-15 LAB — URINALYSIS, ROUTINE W REFLEX MICROSCOPIC
Bilirubin Urine: NEGATIVE
Glucose, UA: >1000 mg/dL — AB
Hgb urine dipstick: NEGATIVE
Ketones, ur: NEGATIVE mg/dL
Leukocytes, UA: NEGATIVE
Nitrite: NEGATIVE
Protein, ur: NEGATIVE mg/dL
Specific Gravity, Urine: 1.031 — ABNORMAL HIGH (ref 1.005–1.030)
pH: 7.5 (ref 5.0–8.0)

## 2016-01-15 LAB — BASIC METABOLIC PANEL
Anion gap: 7 (ref 5–15)
BUN: 12 mg/dL (ref 6–20)
CO2: 31 mmol/L (ref 22–32)
Calcium: 9.4 mg/dL (ref 8.9–10.3)
Chloride: 97 mmol/L — ABNORMAL LOW (ref 101–111)
Creatinine, Ser: 1 mg/dL (ref 0.44–1.00)
GFR calc Af Amer: >60 mL/min (ref >60)
GFR calc non Af Amer: 60 mL/min — ABNORMAL LOW (ref >60)
Glucose, Bld: 389 mg/dL — ABNORMAL HIGH (ref 65–99)
Potassium: 3.7 mmol/L (ref 3.5–5.1)
Sodium: 135 mmol/L (ref 135–145)

## 2016-01-15 LAB — CBC
HCT: 40.4 % (ref 36.0–46.0)
Hemoglobin: 13.6 g/dL (ref 12.0–15.0)
MCH: 27.4 pg (ref 26.0–34.0)
MCHC: 33.7 g/dL (ref 30.0–36.0)
MCV: 81.5 fL (ref 78.0–100.0)
Platelets: 185 10*3/uL (ref 150–400)
RBC: 4.96 MIL/uL (ref 3.87–5.11)
RDW: 13.2 % (ref 11.5–15.5)
WBC: 5.3 10*3/uL (ref 4.0–10.5)

## 2016-01-15 LAB — URINE MICROSCOPIC-ADD ON
Bacteria, UA: NONE SEEN
RBC / HPF: NONE SEEN RBC/hpf (ref 0–5)

## 2016-01-15 LAB — CBG MONITORING, ED: Glucose-Capillary: 383 mg/dL — ABNORMAL HIGH (ref 65–99)

## 2016-01-15 NOTE — ED Notes (Signed)
Pt states "I've been having a headache since i woke up this morning. I'm using the bathroom constantly. I cant get my sugar under 300. It was 498 last i checked." Pt denies any other symptoms.

## 2016-01-16 ENCOUNTER — Emergency Department (HOSPITAL_COMMUNITY): Payer: Medicare Other

## 2016-01-16 DIAGNOSIS — R51 Headache: Secondary | ICD-10-CM | POA: Diagnosis not present

## 2016-01-16 DIAGNOSIS — E1165 Type 2 diabetes mellitus with hyperglycemia: Secondary | ICD-10-CM | POA: Diagnosis not present

## 2016-01-16 LAB — CBG MONITORING, ED: Glucose-Capillary: 257 mg/dL — ABNORMAL HIGH (ref 65–99)

## 2016-01-16 MED ORDER — SODIUM CHLORIDE 0.9 % IV BOLUS (SEPSIS)
1000.0000 mL | Freq: Once | INTRAVENOUS | Status: AC
  Administered 2016-01-16: 1000 mL via INTRAVENOUS

## 2016-01-16 MED ORDER — PROCHLORPERAZINE EDISYLATE 5 MG/ML IJ SOLN
10.0000 mg | Freq: Once | INTRAMUSCULAR | Status: AC
  Administered 2016-01-16: 10 mg via INTRAVENOUS
  Filled 2016-01-16: qty 2

## 2016-01-16 NOTE — ED Notes (Signed)
Pt left at this time with all belongings.  

## 2016-01-16 NOTE — ED Notes (Signed)
Patient transported to CT 

## 2016-01-16 NOTE — ED Notes (Signed)
Pt back in room.

## 2016-01-16 NOTE — ED Notes (Signed)
CBG 257  

## 2016-01-16 NOTE — Discharge Instructions (Signed)
1. Medications: Continue both medications, usual home medications 2. Treatment: rest, drink plenty of fluids, decrease simple sugars in your diet 3. Follow Up: Please followup with your primary doctor at your appointment on Thursday days for discussion of your diagnoses and further evaluation after today's visit; if you do not have a primary care doctor use the resource guide provided to find one; Please return to the ER for worsening symptoms

## 2016-01-16 NOTE — ED Provider Notes (Signed)
CSN: [id number]     Arrival date & time 01/15/16  1925 History   First MD Initiated Contact with Patient 01/15/16 2351     Chief Complaint  Patient presents with  . Hyperglycemia  . Headache     (Consider location/radiation/quality/duration/timing/severity/associated sxs/prior Treatment) The history is provided by the patient and medical records. No language interpreter was used.     Bonnie Gordon is a 60 y.o. female  with a hx of NIDDM (glimepiride and then began Tradjenta 3 days ago), sleep apnea (no CPAP), HTN, vertigo, diabetic neuropathy presents to the Emergency Department complaining of gradual, persistent, progressively worsening hyperglycemia onset 1 month ago but worse this morning and throughout the day. Pt reports CBG normally runs 100-120.  She reports her CBG has been higher than usual since her mother's death in Nov 14, 2022.  She reports she came to the hospital today because she is concerned about this.  Associated symptoms include headache present every morning for the last month and reports feeling off balance during the day.  Denies vertigo ssx.  Pt denies falling or head injury.  Denies vision changes.  She denies previous hx of headaches.  She reports her headache improves throughout the day but does not resolve. She also reports neck pain and stiffness in the mornings that also improves throughout the day.  Pt has been taking Aleve without minimal relief.  Nothing makes it worse.  No phono or photosensitivity.  Pt denies fever, chills, CP, SOB, abd pain, N/V/D, weakness, syncope, dysuria, hematuria.  Denies recent travel or tick bites.  No rashes.      Past Medical History  Diagnosis Date  . Hypertension   . Hypokalemia   . Diverticulitis   . Hypercholesteremia   . Vertigo     patient reported  . Chest pain 2009    echo  . Sleep apnea   . Complication of anesthesia     pt. states she is difficult to wake up  . PONV (postoperative nausea and vomiting)   . Diabetes mellitus      Type II  . History of bronchitis   . Shortness of breath dyspnea   . Anxiety   . Depression   . Bipolar disorder (HCC)   . GERD (gastroesophageal reflux disease)   . Headache   . Diabetic neuropathy [facility])    Past Surgical History  Procedure Laterality Date  . Cholecystectomy    . Abdominal hysterectomy    . Appendectomy    . Wisdom tooth extraction    . Cardiac catheterization  2009    normal cath  . Cardiac catheterization  2004    normal cath  . Tonsillectomy/adenoidectomy/turbinate reduction Bilateral 03/01/2015    Procedure: TONSILLECTOMY/TURBINATE REDUCTION;  Surgeon: Drema Halon, MD;  Location: [facility] OR;  Service: ENT;  Laterality: Bilateral;  . Uvulopalatopharyngoplasty Bilateral 03/01/2015    Procedure: UVULOPALATOPHARYNGOPLASTY (UPPP);  Surgeon: Drema Halon, MD;  Location: [facility] OR;  Service: ENT;  Laterality: Bilateral;   Family History  Problem Relation Age of Onset  . Diabetes Mother   . Gout Mother   . Hypertension Mother   . Arthritis Mother   . Heart disease Father   . Heart failure Sister   . Arthritis Sister   . Heart disease Maternal Grandmother   . Diabetes Maternal Grandmother   . Heart disease Paternal Grandmother   . Diabetes Paternal Grandmother   . Diabetes Sister   . Bursitis Sister   . Crohn's disease Sister  Social History  Substance Use Topics  . Smoking status: Former Smoker    Quit date: 08/07/1982  . Smokeless tobacco: Never Used  . Alcohol Use: Yes     Comment: occasional / social   OB History    No data available     Review of Systems  Constitutional: Negative for fever, diaphoresis, appetite change, fatigue and unexpected weight change.  HENT: Negative for mouth sores.   Eyes: Negative for visual disturbance.  Respiratory: Negative for cough, chest tightness, shortness of breath and wheezing.   Cardiovascular: Negative for chest pain.  Gastrointestinal: Negative for nausea, vomiting, abdominal pain,  diarrhea and constipation.  Endocrine: Positive for polydipsia, polyphagia and polyuria.  Genitourinary: Negative for dysuria, urgency, frequency and hematuria.  Musculoskeletal: Negative for back pain and neck stiffness.  Skin: Negative for rash.  Allergic/Immunologic: Negative for immunocompromised state.  Neurological: Positive for headaches. Negative for syncope and light-headedness.       Feeling "off balance"  Hematological: Does not bruise/bleed easily.  Psychiatric/Behavioral: Negative for sleep disturbance. The patient is not nervous/anxious.       Allergies  Other; Asa; and Latex  Home Medications   Prior to Admission medications   Medication Sig Start Date End Date Taking? Authorizing Provider  ACCU-CHEK AVIVA PLUS test strip  04/16/14   Historical Provider, MD  ACCU-CHEK SOFTCLIX LANCETS lancets  04/16/14   Historical Provider, MD  acidophilus (RISAQUAD) CAPS capsule Take 1 capsule by mouth daily.    Historical Provider, MD  amLODipine (NORVASC) 5 MG tablet TAKE 1 TABLET BY MOUTH EVERY DAY 11/04/15   Lennette Biharihomas A Kelly, MD  aspirin EC 81 MG tablet Take 81 mg by mouth daily.    Historical Provider, MD  BOOSTRIX 5-2.5-18.5 injection TO BE ADMINISTERED BY PHARMACIST FOR IMMUNIZATION 07/14/15   Historical Provider, MD  cetirizine (ZYRTEC ALLERGY) 10 MG tablet Take 1 tablet (10 mg total) by mouth daily. 09/24/15   Danelle BerryLeisa Tapia, PA-C  ciprofloxacin (CIPRO) 500 MG tablet Take 1 tablet (500 mg total) by mouth 2 (two) times daily. One po bid x 7 days 09/19/15   Richardean Canalavid H Yao, MD  fluticasone [facility](FLONASE) 50 MCG/ACT nasal spray Place 2 sprays into both nostrils daily. 09/24/15   Danelle BerryLeisa Tapia, PA-C  glimepiride (AMARYL) 4 MG tablet Take 4 mg by mouth daily with breakfast.    Historical Provider, MD  hydrochlorothiazide (MICROZIDE) 12.5 MG capsule Take 1 capsule (12.5 mg total) by mouth daily. 08/30/15   Lennette Biharihomas A Kelly, MD  HYDROcodone-acetaminophen (NORCO/VICODIN) 5-325 MG tablet Take 1-2 tablets by  mouth every 4 (four) hours as needed. 09/19/15   Richardean Canalavid H Yao, MD  magnesium oxide (MAG-OX) 400 MG tablet Take 400 mg by mouth daily.    Historical Provider, MD  meloxicam (MOBIC) 7.5 MG tablet Take 7.5 mg by mouth daily.    Historical Provider, MD  metroNIDAZOLE (FLAGYL) 500 MG tablet Take 1 tablet (500 mg total) by mouth 2 (two) times daily. One po bid x 7 days 09/19/15   Richardean Canalavid H Yao, MD  Multiple Vitamins-Minerals (MULTIVITAMIN WITH MINERALS) tablet Take 1 tablet by mouth daily.    Historical Provider, MD  mupirocin ointment (BACTROBAN) 2 % Place 1 application into the nose 2 (two) times daily. 09/24/15   Danelle BerryLeisa Tapia, PA-C  rosuvastatin (CRESTOR) 40 MG tablet Take 1 tablet (40 mg total) by mouth daily. 09/20/15   Lennette Biharihomas A Kelly, MD  VENTOLIN HFA 108 (90 BASE) MCG/ACT inhaler Inhale 2 puffs into the lungs every 4 (  four) hours as needed for wheezing or shortness of breath.  02/12/15   Historical Provider, MD   BP 131/78 mmHg  Pulse 73  Temp(Src) 98.1 F (36.7 C) (Oral)  Resp 16  Ht 5\' 6"  (1.676 m)  Wt 82.101 kg  BMI 29.23 kg/m2  SpO2 95% Physical Exam  Constitutional: She is oriented to person, place, and time. She appears well-developed and well-nourished. No distress.  Awake, alert, nontoxic appearance  HENT:  Head: Normocephalic and atraumatic.  Mouth/Throat: Oropharynx is clear and moist. No oropharyngeal exudate.  Eyes: Conjunctivae and EOM are normal. Pupils are equal, round, and reactive to light. No scleral icterus.  No horizontal, vertical or rotational nystagmus  Neck: Normal range of motion. Neck supple.  Full active and passive ROM without pain No midline or paraspinal tenderness No nuchal rigidity or meningeal signs  Cardiovascular: Normal rate, regular rhythm, normal heart sounds and intact distal pulses.   Pulmonary/Chest: Effort normal and breath sounds normal. No respiratory distress. She has no wheezes. She has no rales.  Equal chest expansion  Abdominal: Soft. Bowel  sounds are normal. She exhibits no mass. There is no tenderness. There is no rebound and no guarding.  Musculoskeletal: Normal range of motion. She exhibits no edema.  Lymphadenopathy:    She has no cervical adenopathy.  Neurological: She is alert and oriented to person, place, and time. She has normal reflexes. No cranial nerve deficit. She exhibits normal muscle tone. Coordination normal.  Mental Status:  Alert, oriented, thought content appropriate. Speech fluent without evidence of aphasia. Able to follow 2 step commands without difficulty.  Cranial Nerves:  II:  Peripheral visual fields grossly normal, pupils equal, round, reactive to light III,IV, VI: ptosis not present, extra-ocular motions intact bilaterally  V,VII: smile symmetric, facial light touch sensation equal VIII: hearing grossly normal bilaterally  IX,X: midline uvula rise  XI: bilateral shoulder shrug equal and strong XII: midline tongue extension  Motor:  5/5 in upper and lower extremities bilaterally including strong and equal grip strength and dorsiflexion/plantar flexion Sensory: Pinprick and light touch normal in all extremities.  Deep Tendon Reflexes: 2+ and symmetric  Cerebellar: normal finger-to-nose with bilateral upper extremities Gait: normal gait and balance CV: distal pulses palpable throughout   Skin: Skin is warm and dry. No rash noted. She is not diaphoretic.  Psychiatric: She has a normal mood and affect. Her behavior is normal. Judgment and thought content normal.  Nursing note and vitals reviewed.   ED Course  Procedures (including critical care time) Labs Review Labs Reviewed  BASIC METABOLIC PANEL - Abnormal; Notable for the following:    Chloride 97 (*)    Glucose, Bld 389 (*)    GFR calc non Af Amer 60 (*)    All other components within normal limits  URINALYSIS, ROUTINE W REFLEX MICROSCOPIC (NOT AT [facility]) - Abnormal; Notable for the following:    Specific Gravity, Urine 1.031 (*)     Glucose, UA >1000 (*)    All other components within normal limits  URINE MICROSCOPIC-ADD ON - Abnormal; Notable for the following:    Squamous Epithelial / LPF 0-5 (*)    All other components within normal limits  CBG MONITORING, ED - Abnormal; Notable for the following:    Glucose-Capillary 383 (*)    All other components within normal limits  CBG MONITORING, ED - Abnormal; Notable for the following:    Glucose-Capillary 257 (*)    All other components within normal limits  CBC  Imaging Review Ct Head Wo Contrast  01/16/2016  CLINICAL DATA:  Acute onset of headaches.  Initial encounter. EXAM: CT HEAD WITHOUT CONTRAST TECHNIQUE: Contiguous axial images were obtained from the base of the skull through the vertex without intravenous contrast. COMPARISON:  Temporal bone CT performed 09/06/2014 FINDINGS: There is no evidence of acute infarction, mass lesion, or intra- or extra-axial hemorrhage on CT. The posterior fossa, including the cerebellum, brainstem and fourth ventricle, is within normal limits. The third and lateral ventricles, and basal ganglia are unremarkable in appearance. The cerebral hemispheres are symmetric in appearance, with normal gray-white differentiation. No mass effect or midline shift is seen. There is no evidence of fracture; visualized osseous structures are unremarkable in appearance. The orbits are within normal limits. The paranasal sinuses and mastoid air cells are well-aerated. No significant soft tissue abnormalities are seen. IMPRESSION: Unremarkable noncontrast CT of the head. Electronically Signed   By: Roanna Raider M.D.   On: 01/16/2016 03:23   I have personally reviewed and evaluated these images and lab results as part of my medical decision-making.    MDM   Final diagnoses:  Hyperglycemia  Nonintractable headache, unspecified chronicity pattern, unspecified headache type  Type 2 diabetes mellitus with diabetic neuropathy, without long-term current  use of insulin (HCC)  Obstructive apnea   Davanna Jennette Gordon presents with hyperglycemia and headache.  Normal neurologic exam. Blood glucose has decreased to 257 with fluids only.  Labs are otherwise reassuring.  Discussed at length dietary changes for diabetes.  Her secondary oral hyperglycemic was only admitted 3 days ago. She is a follow-up with her primary care physician in 4 days. Recommend that she continue these 2 medications with diet changes and close continued home monitoring of her blood sugars.  Evidence of DKA today. I do not believe the patient will benefit from insulin today.  She is well-appearing.  Fluid boluses given.  Patient also with headache, worse in the morning. She has a history of sleep apnea and is likely the cause of her headaches. CT head without acute abnormalities. No evidence of mass lesion. No evidence of CVA. Patient with normal neurologic exam. Recommend Tylenol or ibuprofen. Patient will need to discuss with her primary care physician CPAP fitting.   The patient was discussed with and seen by Dr. Clarene Duke who agrees with the treatment plan.     Dierdre Forth, PA-C 01/16/16 0344  Laurence Spates, MD 01/20/16 1901

## 2016-01-20 DIAGNOSIS — E782 Mixed hyperlipidemia: Secondary | ICD-10-CM | POA: Diagnosis not present

## 2016-01-20 DIAGNOSIS — I10 Essential (primary) hypertension: Secondary | ICD-10-CM | POA: Diagnosis not present

## 2016-01-20 DIAGNOSIS — E119 Type 2 diabetes mellitus without complications: Secondary | ICD-10-CM | POA: Diagnosis not present

## 2016-02-14 DIAGNOSIS — M25511 Pain in right shoulder: Secondary | ICD-10-CM | POA: Diagnosis not present

## 2016-02-15 DIAGNOSIS — E119 Type 2 diabetes mellitus without complications: Secondary | ICD-10-CM | POA: Diagnosis not present

## 2016-02-15 DIAGNOSIS — N390 Urinary tract infection, site not specified: Secondary | ICD-10-CM | POA: Diagnosis not present

## 2016-02-15 DIAGNOSIS — I10 Essential (primary) hypertension: Secondary | ICD-10-CM | POA: Diagnosis not present

## 2016-02-15 DIAGNOSIS — M545 Low back pain: Secondary | ICD-10-CM | POA: Diagnosis not present

## 2016-02-18 DIAGNOSIS — N760 Acute vaginitis: Secondary | ICD-10-CM | POA: Diagnosis not present

## 2016-02-18 DIAGNOSIS — Z113 Encounter for screening for infections with a predominantly sexual mode of transmission: Secondary | ICD-10-CM | POA: Diagnosis not present

## 2016-02-18 DIAGNOSIS — A5901 Trichomonal vulvovaginitis: Secondary | ICD-10-CM | POA: Diagnosis not present

## 2016-02-22 DIAGNOSIS — M25511 Pain in right shoulder: Secondary | ICD-10-CM | POA: Diagnosis not present

## 2016-02-25 DIAGNOSIS — M75121 Complete rotator cuff tear or rupture of right shoulder, not specified as traumatic: Secondary | ICD-10-CM | POA: Diagnosis not present

## 2016-02-28 DIAGNOSIS — M75121 Complete rotator cuff tear or rupture of right shoulder, not specified as traumatic: Secondary | ICD-10-CM | POA: Diagnosis not present

## 2016-02-29 ENCOUNTER — Ambulatory Visit (INDEPENDENT_AMBULATORY_CARE_PROVIDER_SITE_OTHER): Payer: Medicare Other | Admitting: Nurse Practitioner

## 2016-02-29 ENCOUNTER — Encounter: Payer: Self-pay | Admitting: Nurse Practitioner

## 2016-02-29 VITALS — BP 129/85 | HR 85 | Ht 66.0 in | Wt 184.0 lb

## 2016-02-29 DIAGNOSIS — I10 Essential (primary) hypertension: Secondary | ICD-10-CM

## 2016-02-29 DIAGNOSIS — Z01810 Encounter for preprocedural cardiovascular examination: Secondary | ICD-10-CM | POA: Diagnosis not present

## 2016-02-29 DIAGNOSIS — Z79899 Other long term (current) drug therapy: Secondary | ICD-10-CM | POA: Diagnosis not present

## 2016-02-29 DIAGNOSIS — R0789 Other chest pain: Secondary | ICD-10-CM

## 2016-02-29 DIAGNOSIS — E785 Hyperlipidemia, unspecified: Secondary | ICD-10-CM

## 2016-02-29 DIAGNOSIS — I2510 Atherosclerotic heart disease of native coronary artery without angina pectoris: Secondary | ICD-10-CM

## 2016-02-29 NOTE — Progress Notes (Signed)
Office Visit    Patient Name: Foster Stogsdill Date of Encounter: 02/29/2016  Primary Care Provider:  Karie Chimera, MD Primary Cardiologist:  Bishop Limbo, MD   Chief Complaint    60 year old female with a history of nonobstructive CAD and intermittent chest pain, who presents for follow-up.  Past Medical History    Past Medical History:  Diagnosis Date  . Anxiety   . Bipolar disorder (HCC)   . Chest pain    a. 01/2003 MV w/ ? ant-inflat ischemia-->Cath: LAD 20p, otw nonobs dzs-->Med Rx; b. 2009 Cath: LAD 30, LCX nl/nondominant, RCA 30, nl EF; c. 08/2014 MV: no ischemia/infarct.  . Complication of anesthesia    pt. states she is difficult to wake up  . Depression   . Diabetes mellitus    Type II  . Diabetic neuropathy (HCC)   . Diverticulitis   . GERD (gastroesophageal reflux disease)   . Headache   . History of bronchitis   . Hypercholesteremia   . Hypertension   . Hypokalemia   . PONV (postoperative nausea and vomiting)   . Shortness of breath dyspnea   . Sleep apnea   . Vertigo    patient reported   Past Surgical History:  Procedure Laterality Date  . ABDOMINAL HYSTERECTOMY    . APPENDECTOMY    . CARDIAC CATHETERIZATION  2009   normal cath  . CARDIAC CATHETERIZATION  2004   normal cath  . CHOLECYSTECTOMY    . TONSILLECTOMY/ADENOIDECTOMY/TURBINATE REDUCTION Bilateral 03/01/2015   Procedure: TONSILLECTOMY/TURBINATE REDUCTION;  Surgeon: Drema Halon, MD;  Location: [facility] OR;  Service: ENT;  Laterality: Bilateral;  . UVULOPALATOPHARYNGOPLASTY Bilateral 03/01/2015   Procedure: UVULOPALATOPHARYNGOPLASTY (UPPP);  Surgeon: Drema Halon, MD;  Location: [facility] OR;  Service: ENT;  Laterality: Bilateral;  . WISDOM TOOTH EXTRACTION      Allergies  Allergies  Allergen Reactions  . Other Other (See Comments)    No Blood Products, personal preference  . Asa [Aspirin] Other (See Comments)    Bruises easily  . Latex Itching and Rash    History of Present Illness      60 year old female with a prior history of nonobstructive CAD, intermittent chest pain, hypertension, hyperlipidemia, and diabetes. Cardiac history dates back to 2004, when she expands chest pressure and had a mildly abnormal Myoview at [facility]. This was followed by catheterization revealing minimal proximal LAD disease. She subsequently underwent repeat catheterization 2009, again showing mild nonobstructive LAD and RCA disease. Her last Myoview was injected with 2016, and was not ischemic. She was last seen in clinic by Dr. Tresa Endo in January of this year, at which time she was doing reasonably well. Her blood pressure was elevated and diuretic therapy was added to her regimen. She was also found to have elevated lipids with a total cholesterol 236 and a LDL of 140, and she was placed back on Crestor 40 mg. She has not had follow-up lipids since then.  Since May of this year, she's been having right shoulder and arm pain as well as right-sided midsternal chest discomfort. She thinks the chest discomfort may be related to her arm pain though it does not necessarily worsen with movement of her arm. There is no associated dyspnea, nausea, or diaphoresis. Current chest pain symptoms are different than prior chest pain symptoms. She has been evaluated by orthopedics and is currently awaiting scheduling of arthroscopic right shoulder surgery. She denies dyspnea on exertion, PND, orthopnea, dizziness, syncope, edema, palpitations, or early satiety.  Home Medications    Prior to Admission medications   Medication Sig Start Date End Date Taking? Authorizing Provider  ACCU-CHEK AVIVA PLUS test strip  04/16/14  Yes Historical Provider, MD  ACCU-CHEK SOFTCLIX LANCETS lancets  04/16/14  Yes Historical Provider, MD  acidophilus (RISAQUAD) CAPS capsule Take 1 capsule by mouth daily.   Yes Historical Provider, MD  amLODipine (NORVASC) 5 MG tablet TAKE 1 TABLET BY MOUTH EVERY DAY 11/04/15  Yes Lennette Bihari,  MD  BOOSTRIX 5-2.5-18.5 injection TO BE ADMINISTERED BY PHARMACIST FOR IMMUNIZATION 07/14/15  Yes Historical Provider, MD  fluticasone (FLONASE) 50 MCG/ACT nasal spray Place 2 sprays into both nostrils daily. 09/24/15  Yes [name], PA-C  hydrochlorothiazide (MICROZIDE) 12.5 MG capsule Take 1 capsule (12.5 mg total) by mouth daily. 08/30/15  Yes Lennette Bihari, MD  KLOR-CON M20 20 MEQ tablet  02/26/16  Yes Historical Provider, MD  meloxicam (MOBIC) 7.5 MG tablet Take 7.5 mg by mouth daily.   Yes Historical Provider, MD  metroNIDAZOLE (FLAGYL) 500 MG tablet Take 1 tablet (500 mg total) by mouth 2 (two) times daily. One po bid x 7 days 09/19/15  Yes Charlynne Pander, MD  Multiple Vitamins-Minerals (MULTIVITAMIN WITH MINERALS) tablet Take 1 tablet by mouth daily.   Yes Historical Provider, MD  mupirocin ointment (BACTROBAN) 2 % Place 1 application into the nose 2 (two) times daily. 09/24/15  Yes [name], PA-C  rosuvastatin (CRESTOR) 40 MG tablet Take 1 tablet (40 mg total) by mouth daily. 09/20/15  Yes Lennette Bihari, MD  VENTOLIN HFA 108 (90 BASE) MCG/ACT inhaler Inhale 2 puffs into the lungs every 4 (four) hours as needed for wheezing or shortness of breath.  02/12/15  Yes Historical Provider, MD    Review of Systems    As above, she's been having right arm and shoulder pain since May and this is associated with right-sided chest and midsternal chest discomfort.  She denies palpitations, dyspnea, pnd, orthopnea, n, v, dizziness, syncope, edema, weight gain, or early satiety. All other systems reviewed and are otherwise negative except as noted above.  Physical Exam    VS:  BP 129/85   Pulse 85   Ht  (1.676 m)   Wt 184 lb (83.5 kg)   BMI 29.70 kg/m  , BMI Body mass index is 29.7 kg/m. GEN: Well nourished, well developed, in no acute distress.  HEENT: normal.  Neck: Supple, no JVD, carotid bruits, or masses. Cardiac: RRR, no murmurs, rubs, or gallops. No clubbing, cyanosis, edema.   Radials/DP/PT 2+ and equal bilaterally.  Respiratory:  Respirations regular and unlabored, clear to auscultation bilaterally. GI: Soft, nontender, nondistended, BS + x 4. MS: no deformity or atrophy. Skin: warm and dry, no rash. Neuro:  Strength and sensation are intact. Psych: Normal affect.  Accessory Clinical Findings    ECG - Sinus rhythm, 85, no acute ST or T changes.  Assessment & Plan    1.  Right sided and midsternal chest pain/preoperative cardiovascular examination/nonobstructive CAD: Patient has a somewhat long history of intermittent atypical chest discomfort with prior catheterizations revealing nonobstructive LAD and right coronary artery disease. She had a negative Myoview in January of 2016. Since May, she has been having right arm and shoulder pain related to orthopedic injury. In that setting, she is also been having right-sided and midsternal chest pain. There are no associated symptoms. She is currently pending arthroscopic surgical repair for her right shoulder. In light of recurrent chest pain symptoms,  I will arrange for a Lexiscan Myoview to rule out ischemia.   2. Essential hypertension: Blood pressure stable today. She remains on amlodipine and diuretic therapy.   3. Type 2 diabetes mellitus: This has not been well controlled in the past. She did have an ER visit in June related to hyperglycemia. She is now on insulin and this is followed closely by primary care. She is on a statin. She used to be on an ARB but stopped this on her own prior to her last visit in January.   4. Hyperlipidemia: Lipids in January for elevated at a total cholesterol 236, triglycerides 169, HDL 60, LDL of 140. She was placed on Crestor 40 mg at that time. She has not had follow-up labs since then. We will arrange for fasting lipids and LFTs.  5. Disposition: Follow-up stress testing for preoperative evaluation. Follow-up with Dr. Tresa Endo in 6-8 months or sooner if needed.   02/29/2016, 3:42  PM

## 2016-02-29 NOTE — Patient Instructions (Signed)
Medication Instructions: Ward Givens, NP, recommends that you continue on your current medications as directed. Please refer to the Current Medication list given to you today.  Labwork: Your physician recommends that you return for lab work at your earliest convenience - FASTING.  Testing/Procedures: 1. Lexiscan Myoview - Your physician has requested that you have a lexiscan myoview. For further information please visit [email or web address] Please follow instruction sheet, as given.  Follow-up: Thayer Ohm recommends that you schedule a follow-up appointment in 6-8 months with Dr Tresa Endo. You will receive a reminder letter in the mail two months in advance. If you don't receive a letter, please call our office to schedule the follow-up appointment.  If you need a refill on your cardiac medications before your next appointment, please call your pharmacy.

## 2016-03-01 ENCOUNTER — Ambulatory Visit (INDEPENDENT_AMBULATORY_CARE_PROVIDER_SITE_OTHER): Payer: Medicare Other | Admitting: Internal Medicine

## 2016-03-01 ENCOUNTER — Encounter: Payer: Self-pay | Admitting: Internal Medicine

## 2016-03-01 ENCOUNTER — Telehealth: Payer: Self-pay | Admitting: Cardiovascular Disease

## 2016-03-01 VITALS — BP 122/88 | HR 84 | Ht 66.0 in | Wt 181.0 lb

## 2016-03-01 DIAGNOSIS — I2510 Atherosclerotic heart disease of native coronary artery without angina pectoris: Secondary | ICD-10-CM

## 2016-03-01 DIAGNOSIS — E1159 Type 2 diabetes mellitus with other circulatory complications: Secondary | ICD-10-CM

## 2016-03-01 DIAGNOSIS — E1169 Type 2 diabetes mellitus with other specified complication: Secondary | ICD-10-CM | POA: Insufficient documentation

## 2016-03-01 DIAGNOSIS — E1165 Type 2 diabetes mellitus with hyperglycemia: Secondary | ICD-10-CM

## 2016-03-01 MED ORDER — INSULIN DETEMIR 100 UNIT/ML FLEXPEN: 60.0000 [IU] | PEN_INJECTOR | Freq: Every day | SUBCUTANEOUS | 2 refills | Status: DC

## 2016-03-01 MED ORDER — GLIPIZIDE ER 5 MG PO TB24: 5.0000 mg | ORAL_TABLET | Freq: Every day | ORAL | 2 refills | Status: DC

## 2016-03-01 NOTE — Telephone Encounter (Signed)
Called [facility] back and left msg w our fax number. Clearance pending stress test. Left msg to call if needed.

## 2016-03-01 NOTE — Telephone Encounter (Signed)
New message      Request for surgical clearance:  1. What type of surgery is being performed? On the right should scope done  When is this surgery scheduled? August 8 th 2. Are there any medications that need to be held prior to surgery and how long?the office faxed over form for clearance 3. Name of physician performing surgery? Dr. Ave Filter   What is your office phone and fax number? Fax number [phone]/office number [phone]   The office wants to make sure if Dr. Derrill Memo the form from yesterday

## 2016-03-01 NOTE — Patient Instructions (Addendum)
Please decrease Levemir to 60 units once a day at bedtime.  Please add Glipizide ER 5 mg before b'fast.  Please let me know about the sugars in 2 weeks.  Please return in 1.5 months with your sugar log.   Please let me know if the sugars are consistently <80 or >200.  PATIENT INSTRUCTIONS FOR TYPE 2 DIABETES:  **Please join MyChart!** - see attached instructions about how to join if you have not done so already.  DIET AND EXERCISE Diet and exercise is an important part of diabetic treatment.  We recommended aerobic exercise in the form of brisk walking (working between 40-60% of maximal aerobic capacity, similar to brisk walking) for 150 minutes per week (such as 30 minutes five days per week) along with 3 times per week performing 'resistance' training (using various gauge rubber tubes with handles) 5-10 exercises involving the major muscle groups (upper body, lower body and core) performing 10-15 repetitions (or near fatigue) each exercise. Start at half the above goal but build slowly to reach the above goals. If limited by weight, joint pain, or disability, we recommend daily walking in a swimming pool with water up to waist to reduce pressure from joints while allow for adequate exercise.    BLOOD GLUCOSES Monitoring your blood glucoses is important for continued management of your diabetes. Please check your blood glucoses 2-4 times a day: fasting, before meals and at bedtime (you can rotate these measurements - e.g. one day check before the 3 meals, the next day check before 2 of the meals and before bedtime, etc.).   HYPOGLYCEMIA (low blood sugar) Hypoglycemia is usually a reaction to not eating, exercising, or taking too much insulin/ other diabetes drugs.  Symptoms include tremors, sweating, hunger, confusion, headache, etc. Treat IMMEDIATELY with 15 grams of Carbs: . 4 glucose tablets .  cup regular juice/soda . 2 tablespoons raisins . 4 teaspoons sugar . 1 tablespoon  honey Recheck blood glucose in 15 mins and repeat above if still symptomatic/blood glucose <100.  RECOMMENDATIONS TO REDUCE YOUR RISK OF DIABETIC COMPLICATIONS: * Take your prescribed MEDICATION(S) * Follow a DIABETIC diet: Complex carbs, fiber rich foods, (monounsaturated and polyunsaturated) fats * AVOID saturated/trans fats, high fat foods, >2,300 mg salt per day. * EXERCISE at least 5 times a week for 30 minutes or preferably daily.  * DO NOT SMOKE OR DRINK more than 1 drink a day. * Check your FEET every day. Do not wear tightfitting shoes. Contact us if you develop an ulcer * See your EYE doctor once a year or more if needed * Get a FLU shot once a year * Get a PNEUMONIA vaccine once before and once after age 87 years  GOALS:  * Your Hemoglobin A1c of <7%  * fasting sugars need to be <130 * after meals sugars need to be <180 (2h after you start eating) * Your Systolic BP should be 140 or lower  * Your Diastolic BP should be 80 or lower  * Your HDL (Good Cholesterol) should be 40 or higher  * Your LDL (Bad Cholesterol) should be 100 or lower. * Your Triglycerides should be 150 or lower  * Your Urine microalbumin (kidney function) should be <30 * Your Body Mass Index should be 25 or lower    Please consider the following ways to cut down carbs and fat and increase fiber and micronutrients in your diet: - substitute whole grain for white bread or pasta - substitute brown rice for white  rice - substitute 90-calorie flat bread pieces for slices of bread when possible - substitute sweet potatoes or yams for white potatoes - substitute humus for margarine - substitute tofu for cheese when possible - substitute almond or rice milk for regular milk (would not drink soy milk daily due to concern for soy estrogen influence on breast cancer risk) - substitute dark chocolate for other sweets when possible - substitute water - can add lemon or orange slices for taste - for diet sodas  (artificial sweeteners will trick your body that you can eat sweets without getting calories and will lead you to overeating and weight gain in the long run) - do not skip breakfast or other meals (this will slow down the metabolism and will result in more weight gain over time)  - can try smoothies made from fruit and almond/rice milk in am instead of regular breakfast - can also try old-fashioned (not instant) oatmeal made with almond/rice milk in am - order the dressing on the side when eating salad at a restaurant (pour less than half of the dressing on the salad) - eat as little meat as possible - can try juicing, but should not forget that juicing will get rid of the fiber, so would alternate with eating raw veg./fruits or drinking smoothies - use as little oil as possible, even when using olive oil - can dress a salad with a mix of balsamic vinegar and lemon juice, for e.g. - use agave nectar, stevia sugar, or regular sugar rather than artificial sweateners - steam or broil/roast veggies  - snack on veggies/fruit/nuts (unsalted, preferably) when possible, rather than processed foods - reduce or eliminate aspartame in diet (it is in diet sodas, chewing gum, etc) Read the labels!  Try to read Dr. Janene Harvey book: "Program for Reversing Diabetes" for other ideas for healthy eating.

## 2016-03-01 NOTE — Progress Notes (Addendum)
Patient ID: Bonnie Gordon, female   DOB: 06/22/1956, 60 y.o.   MRN: [id number]   HPI: Bonnie Gordon is a 60 y.o.-year-old female, referred by her PCP, REESE,BETTI D, MD, for management of DM2, dx in [address]285-, insulin-dependent, uncontrolled, with complications (CAD, PN).  Her mother died in 2015-12-13 >> stressed, busy >> sugars higher: 200-500.  Last hemoglobin A1c was: 01/14/2016: HbA1c 11.3% Lab Results  Component Value Date   HGBA1C 8.1 (H) 08/30/2015   HGBA1C 7.1 (H) 02/25/2015   HGBA1C 7.0 (H) 08/26/2014   Pt is on a regimen of: - Levemir 60 units 2x a day - started ~1 mo ago  Prev., Per records from PCP (patient does not remember the names of the medicines): - Glimepiride 4 mg before b'fast/ Glipizide XL 10 mg in am  - Tradjenta 5 mg in am These were stopped when she started insulin.  She was on Metformin >> diarrhea  Pt checks her sugars 2x a day and they have decreased dramatically after starting insulin. - am: 85-109 - 2h after b'fast: n/c - before lunch: n/c - 2h after lunch: n/c - before dinner: 120s - 2h after dinner: n/c - bedtime: n/c - nighttime: n/c No lows. Lowest sugar was 80s; ? hypoglycemia awareness. Highest sugar was 277 - since starting insulin.  Glucometer: AccuChek Aviva  Pt's meals are: - Breakfast:Eggs, bacon, ham, oatmeal, pancakes, grits - Lunch: Burger - Dinner: Meat - Snacks: 1  - no CKD, last BUN/creatinine:  01/13/2016: 12/0.83, glucose 472 Lab Results  Component Value Date   BUN 12 01/15/2016   BUN 9 09/19/2015   CREATININE 1.00 01/15/2016   CREATININE 0.74 09/19/2015   - last set of lipids: 01/13/2016: 147/218/73/69 Lab Results  Component Value Date   CHOL 236 (H) 08/30/2015   HDL 62 08/30/2015   LDLCALC 140 (H) 08/30/2015   TRIG 169 (H) 08/30/2015   CHOLHDL 3.8 08/30/2015  On Crestor 40. - last eye exam was in 12/2015. No DR. + small cataract. - + numbness and tingling in her feet.  She sees podiatry.  Pt has FH of DM in  mother, sister.  She also has a history of HTN, GERD.  Last TSH: 01/13/2016: TSH 1.39 Lab Results  Component Value Date   TSH 0.835 08/30/2015    ROS: Constitutional: + weight gain, no fatigue, + hot flushes, + poor sleep Eyes: + blurry vision, no xerophthalmia ENT: no sore throat, no nodules palpated in throat, no dysphagia/odynophagia, + hoarseness, + decreased hearing Cardiovascular: + CP/no palpitations/+ leg swelling Respiratory: no cough/+ SOB Gastrointestinal: no N/V/D/C/+ acid reflux Musculoskeletal: + Both: muscle/joint aches Skin: no rashes Neurological: no tremors/numbness/tingling/dizziness, + headaches Psychiatric: + Both: depression/anxiety  Past Medical History:  Diagnosis Date  . Anxiety   . Bipolar disorder (HCC)   . Chest pain    a. 01/2003 MV w/ ? ant-inflat ischemia-->Cath: LAD 20p, otw nonobs dzs-->Med Rx; b. 12/13/07 Cath: LAD 30, LCX nl/nondominant, RCA 30, nl EF; c. 08/2014 MV: no ischemia/infarct.  . Complication of anesthesia    pt. states she is difficult to wake up  . Depression   . Diabetes mellitus    Type II  . Diabetic neuropathy (HCC)   . Diverticulitis   . GERD (gastroesophageal reflux disease)   . Headache   . History of bronchitis   . Hypercholesteremia   . Hypertension   . Hypokalemia   . PONV (postoperative nausea and vomiting)   . Shortness of breath dyspnea   . Sleep  apnea   . Vertigo    patient reported   Past Surgical History:  Procedure Laterality Date  . ABDOMINAL HYSTERECTOMY    . APPENDECTOMY    . CARDIAC CATHETERIZATION  2009   normal cath  . CARDIAC CATHETERIZATION  2004   normal cath  . CHOLECYSTECTOMY    . TONSILLECTOMY/ADENOIDECTOMY/TURBINATE REDUCTION Bilateral 03/01/2015   Procedure: TONSILLECTOMY/TURBINATE REDUCTION;  Surgeon: Drema Halon, MD;  Location: [facility] OR;  Service: ENT;  Laterality: Bilateral;  . UVULOPALATOPHARYNGOPLASTY Bilateral 03/01/2015   Procedure: UVULOPALATOPHARYNGOPLASTY (UPPP);   Surgeon: Drema Halon, MD;  Location: [facility] OR;  Service: ENT;  Laterality: Bilateral;  . WISDOM TOOTH EXTRACTION     Social History   Social History  . Marital status: Divorced    Spouse name: N/A  . Number of children: 2   Occupational History  . Disability    Social History Main Topics  . Smoking status: Former Smoker    Quit date: 08/07/1982  . Smokeless tobacco: Never Used  . Alcohol use Yes     Comment: Drinks during the weekends 1 beer or 1 drink of liquor per day   . Drug use: No  . Sexual activity: Yes    Partners: Male    Birth control/ protection: Surgical   Current Outpatient Prescriptions on File Prior to Visit  Medication Sig Dispense Refill  . ACCU-CHEK AVIVA PLUS test strip   5  . ACCU-CHEK SOFTCLIX LANCETS lancets   5  . amLODipine (NORVASC) 5 MG tablet TAKE 1 TABLET BY MOUTH EVERY DAY 90 tablet 1  . fluticasone (FLONASE) 50 MCG/ACT nasal spray Place 2 sprays into both nostrils daily. 16 g 0  . hydrochlorothiazide (MICROZIDE) 12.5 MG capsule Take 1 capsule (12.5 mg total) by mouth daily. 30 capsule 6  . KLOR-CON M20 20 MEQ tablet     . rosuvastatin (CRESTOR) 40 MG tablet Take 1 tablet (40 mg total) by mouth daily. 90 tablet 3  . VENTOLIN HFA 108 (90 BASE) MCG/ACT inhaler Inhale 2 puffs into the lungs every 4 (four) hours as needed for wheezing or shortness of breath.   0  . acidophilus (RISAQUAD) CAPS capsule Take 1 capsule by mouth daily.    Marland Kitchen BOOSTRIX 5-2.5-18.5 injection TO BE ADMINISTERED BY PHARMACIST FOR IMMUNIZATION  0  . meloxicam (MOBIC) 7.5 MG tablet Take 7.5 mg by mouth daily.    . metroNIDAZOLE (FLAGYL) 500 MG tablet Take 1 tablet (500 mg total) by mouth 2 (two) times daily. One po bid x 7 days (Patient not taking: Reported on 03/01/2016) 14 tablet 0  . Multiple Vitamins-Minerals (MULTIVITAMIN WITH MINERALS) tablet Take 1 tablet by mouth daily.    . mupirocin ointment (BACTROBAN) 2 % Place 1 application into the nose 2 (two) times daily. (Patient  not taking: Reported on 03/01/2016) 22 g 0   No current facility-administered medications on file prior to visit.    Allergies  Allergen Reactions  . Other Other (See Comments)    No Blood Products, personal preference  . Asa [Aspirin] Other (See Comments)    Bruises easily  . Latex Itching and Rash   Family History  Problem Relation Age of Onset  . Diabetes Mother   . Gout Mother   . Hypertension Mother   . Arthritis Mother   . Heart disease Father   . Heart failure Sister   . Arthritis Sister   . Heart disease Maternal Grandmother   . Diabetes Maternal Grandmother   . Heart disease  Paternal Grandmother   . Diabetes Paternal Grandmother   . Diabetes Sister   . Bursitis Sister   . Crohn's disease Sister    PE: BP 122/88 (BP Location: Left Arm, Patient Position: Sitting)   Pulse 84   Ht 5\' 6"  (1.676 m)   Wt 181 lb (82.1 kg)   SpO2 92%   BMI 29.21 kg/m  Wt Readings from Last 3 Encounters:  02/29/16 184 lb (83.5 kg)  01/15/16 181 lb (82.1 kg)  09/24/15 184 lb (83.5 kg)   Constitutional: overweight, in NAD Eyes: PERRLA, EOMI, no exophthalmos ENT: moist mucous membranes, no thyromegaly, no cervical lymphadenopathy Cardiovascular: RRR, No MRG Respiratory: CTA B Gastrointestinal: abdomen soft, NT, ND, BS+ Musculoskeletal: no deformities, strength intact in all 4 Skin: moist, warm, no rashes Neurological: no tremor with outstretched hands, DTR normal in all 4  ASSESSMENT: 1. DM2, insulin-dependent, uncontrolled, with complications - CAD - PN  PLAN:  1. Patient with long-standing, uncontrolled diabetes, previously on oral antidiabetic regimen, which became insufficient >> switched to basal insulin, of which she is now taking a large amount: 120 units a day. Sugars are much lower compared to before, but she has occasional lows and also spikes of blood sugars in the 200s. We discussed about the fact that basal insulin is not usually enough to control postprandial sugars,  so we decided to decrease the Levemir to half and add glipizide extended-release. She will contact me in 2 weeks to see if we need to change the Levemir or glipizide dosage. Otherwise, I will see her back in a month and a half with her sugar log. - We also discussed about proper nutrition - We also discussed about the effect of different alcohol drinks on sugars - I suggested to:  Patient Instructions  Please decrease Levemir to 60 units once a day at bedtime.  Please add Glipizide ER 5 mg before b'fast.  Please let me know about the sugars in 2 weeks.  Please return in 1.5 months with your sugar log.   Please let me know if the sugars are consistently <80 or >200.  - Strongly advised her to check sugars at different times of the day - check 2 times a day, rotating checks - given sugar log and advised how to fill it and to bring it at next appt  - given foot care handout and explained the principles  - given instructions for hypoglycemia management "15-15 rule"  - advised for yearly eye exams  - Return to clinic in 1.5 mo with sugar log   Carlus Pavlov, MD PhD [facility]

## 2016-03-02 ENCOUNTER — Encounter (HOSPITAL_COMMUNITY): Payer: Self-pay

## 2016-03-02 DIAGNOSIS — E785 Hyperlipidemia, unspecified: Secondary | ICD-10-CM | POA: Diagnosis not present

## 2016-03-02 DIAGNOSIS — Z79899 Other long term (current) drug therapy: Secondary | ICD-10-CM | POA: Diagnosis not present

## 2016-03-02 LAB — LIPID PANEL
Cholesterol: 141 mg/dL (ref 125–200)
HDL: 67 mg/dL (ref >=46)
LDL Cholesterol: 55 mg/dL (ref <130)
Total CHOL/HDL Ratio: 2.1 Ratio (ref <=5.0)
Triglycerides: 94 mg/dL (ref <150)
VLDL: 19 mg/dL (ref <30)

## 2016-03-02 LAB — HEPATIC FUNCTION PANEL
ALT: 20 U/L (ref 6–29)
AST: 16 U/L (ref 10–35)
Albumin: 4 g/dL (ref 3.6–5.1)
Alkaline Phosphatase: 99 U/L (ref 33–130)
Bilirubin, Direct: 0.1 mg/dL (ref <=0.2)
Indirect Bilirubin: 0.3 mg/dL (ref 0.2–1.2)
Total Bilirubin: 0.4 mg/dL (ref 0.2–1.2)
Total Protein: 7.2 g/dL (ref 6.1–8.1)

## 2016-03-03 ENCOUNTER — Telehealth: Payer: Self-pay | Admitting: Internal Medicine

## 2016-03-03 ENCOUNTER — Telehealth (HOSPITAL_COMMUNITY): Payer: Self-pay

## 2016-03-03 NOTE — Telephone Encounter (Signed)
New Message ° °Pt returning RN call. Please call back to discuss  °

## 2016-03-03 NOTE — Telephone Encounter (Signed)
Encounter complete. 

## 2016-03-03 NOTE — Telephone Encounter (Signed)
Received incoming call from patient. She was requesting her most recent lab results.  It looks as thought Elliot Cousin had left a message for the patient to call back.  Lab results given to patient as follows: Notes Recorded by Ok Anis, NP on 03/03/2016 at 7:42 AM EDT Lipids markedly improved on crestor. lft's wnl. Cont crestor.  Patient verbalized understanding.  Routing to [employer].

## 2016-03-03 NOTE — Telephone Encounter (Signed)
Pt has had sugars that are going up and down since yesterday they are not leveling out This am 107 before breakfast after breakfast over 200 and right now it is still in the 200s She took one additional unit of insulin the last one before this was last night at 10 pm

## 2016-03-06 NOTE — Telephone Encounter (Signed)
Let's increase her glipizide extended-release from 5 to 10 mg daily before breakfast. She will need to take 2 tablets instead of 1. Please do not increase the basal insulin further.

## 2016-03-07 ENCOUNTER — Telehealth (HOSPITAL_COMMUNITY): Payer: Self-pay

## 2016-03-07 NOTE — Telephone Encounter (Signed)
Encounter complete. 

## 2016-03-07 NOTE — Telephone Encounter (Signed)
Called and left message for patient regarding the medication change from 5 mg to 10 mg, to take 2 tablets instead of 1 tablet. Advised patient to call back so we could discuss how she was doing and just to check in.

## 2016-03-09 ENCOUNTER — Ambulatory Visit (HOSPITAL_COMMUNITY)
Admission: RE | Admit: 2016-03-09 | Discharge: 2016-03-09 | Disposition: A | Discharge status: Home / Self Care | Payer: Medicare Other | Source: Ambulatory Visit | Attending: Cardiology | Admitting: Cardiology

## 2016-03-09 DIAGNOSIS — Z87891 Personal history of nicotine dependence: Secondary | ICD-10-CM | POA: Insufficient documentation

## 2016-03-09 DIAGNOSIS — G4733 Obstructive sleep apnea (adult) (pediatric): Secondary | ICD-10-CM | POA: Insufficient documentation

## 2016-03-09 DIAGNOSIS — I4510 Unspecified right bundle-branch block: Secondary | ICD-10-CM | POA: Diagnosis not present

## 2016-03-09 DIAGNOSIS — R0789 Other chest pain: Secondary | ICD-10-CM | POA: Diagnosis not present

## 2016-03-09 DIAGNOSIS — I10 Essential (primary) hypertension: Secondary | ICD-10-CM | POA: Insufficient documentation

## 2016-03-09 DIAGNOSIS — E119 Type 2 diabetes mellitus without complications: Secondary | ICD-10-CM | POA: Insufficient documentation

## 2016-03-09 DIAGNOSIS — Z8249 Family history of ischemic heart disease and other diseases of the circulatory system: Secondary | ICD-10-CM | POA: Diagnosis not present

## 2016-03-09 DIAGNOSIS — R0609 Other forms of dyspnea: Secondary | ICD-10-CM | POA: Diagnosis not present

## 2016-03-09 DIAGNOSIS — Z01810 Encounter for preprocedural cardiovascular examination: Secondary | ICD-10-CM | POA: Insufficient documentation

## 2016-03-09 LAB — MYOCARDIAL PERFUSION IMAGING
Estimated workload: 1 METS
LV dias vol: 74 mL (ref 46–106)
LV sys vol: 38 mL
Peak BP: 122 mmHg
Peak HR: 94 {beats}/min
Percent of predicted max HR: 58 %
Rest HR: 69 {beats}/min
SDS: 2
SRS: 1
SSS: 3
Stage 1 DBP: 86 mmHg
Stage 1 Grade: 0 %
Stage 1 HR: 72 {beats}/min
Stage 1 SBP: 122 mmHg
Stage 1 Speed: 0 mph
Stage 2 Grade: 0 %
Stage 2 HR: 72 {beats}/min
Stage 2 Speed: 0 mph
Stage 3 DBP: 76 mmHg
Stage 3 Grade: 0 %
Stage 3 HR: 94 {beats}/min
Stage 3 SBP: 122 mmHg
Stage 3 Speed: 0 mph
Stage 4 DBP: 81 mmHg
Stage 4 Grade: 0 %
Stage 4 HR: 82 {beats}/min
Stage 4 SBP: 126 mmHg
Stage 4 Speed: 0 mph
TID: 1.12

## 2016-03-09 MED ORDER — TECHNETIUM TC 99M TETROFOSMIN IV KIT
31.1000 | PACK | Freq: Once | INTRAVENOUS | Status: AC | PRN
  Administered 2016-03-09: 31.1 via INTRAVENOUS
  Filled 2016-03-09: qty 31

## 2016-03-09 MED ORDER — TECHNETIUM TC 99M TETROFOSMIN IV KIT
10.9000 | PACK | Freq: Once | INTRAVENOUS | Status: AC | PRN
  Administered 2016-03-09: 10.9 via INTRAVENOUS
  Filled 2016-03-09: qty 11

## 2016-03-09 MED ORDER — REGADENOSON 0.4 MG/5ML IV SOLN
0.4000 mg | Freq: Once | INTRAVENOUS | Status: AC
  Administered 2016-03-09: 0.4 mg via INTRAVENOUS

## 2016-03-10 NOTE — Telephone Encounter (Signed)
Yes, she can proceed with surgery without further cardiac eval.

## 2016-03-10 NOTE — Telephone Encounter (Signed)
Follow up      Returning a call to the nurse to get stress test results.  Pt is due to have surgery soon.

## 2016-03-10 NOTE — Telephone Encounter (Signed)
Returned pt call and informed her of normal stress test results.  She has surgery scheduled for aug 8th. Is it OK to send surgical clearance notification?

## 2016-03-13 DIAGNOSIS — N898 Other specified noninflammatory disorders of vagina: Secondary | ICD-10-CM | POA: Diagnosis not present

## 2016-03-13 NOTE — Telephone Encounter (Signed)
Pt aware that she has clearance and will fax to Dr. Veda Canninghandler's office

## 2016-03-14 DIAGNOSIS — M19011 Primary osteoarthritis, right shoulder: Secondary | ICD-10-CM | POA: Diagnosis not present

## 2016-03-14 DIAGNOSIS — M7541 Impingement syndrome of right shoulder: Secondary | ICD-10-CM | POA: Diagnosis not present

## 2016-03-14 DIAGNOSIS — G8918 Other acute postprocedural pain: Secondary | ICD-10-CM | POA: Diagnosis not present

## 2016-03-14 DIAGNOSIS — M75101 Unspecified rotator cuff tear or rupture of right shoulder, not specified as traumatic: Secondary | ICD-10-CM | POA: Diagnosis not present

## 2016-03-14 DIAGNOSIS — M24111 Other articular cartilage disorders, right shoulder: Secondary | ICD-10-CM | POA: Diagnosis not present

## 2016-03-14 DIAGNOSIS — M75121 Complete rotator cuff tear or rupture of right shoulder, not specified as traumatic: Secondary | ICD-10-CM | POA: Diagnosis not present

## 2016-03-18 ENCOUNTER — Other Ambulatory Visit: Payer: Self-pay | Admitting: Cardiovascular Disease

## 2016-03-22 ENCOUNTER — Other Ambulatory Visit: Payer: Self-pay

## 2016-03-22 ENCOUNTER — Telehealth: Payer: Self-pay

## 2016-03-22 MED ORDER — GLIPIZIDE ER 5 MG PO TB24: ORAL_TABLET | ORAL | 1 refills | Status: DC

## 2016-03-22 NOTE — Telephone Encounter (Signed)
Called and spoke with patient about what medications she was taking at this time. She stated she was only taking the glipizide, 2 tabs, not just one. Notified MD. Faxed back sheet for [employer].

## 2016-03-24 DIAGNOSIS — M19011 Primary osteoarthritis, right shoulder: Secondary | ICD-10-CM | POA: Diagnosis not present

## 2016-03-27 DIAGNOSIS — M75111 Incomplete rotator cuff tear or rupture of right shoulder, not specified as traumatic: Secondary | ICD-10-CM | POA: Diagnosis not present

## 2016-03-27 DIAGNOSIS — M25511 Pain in right shoulder: Secondary | ICD-10-CM | POA: Diagnosis not present

## 2016-03-27 DIAGNOSIS — M25611 Stiffness of right shoulder, not elsewhere classified: Secondary | ICD-10-CM | POA: Diagnosis not present

## 2016-03-29 ENCOUNTER — Telehealth: Payer: Self-pay | Admitting: Internal Medicine

## 2016-03-29 MED ORDER — GLIPIZIDE ER 10 MG PO TB24: 10.0000 mg | ORAL_TABLET | Freq: Every day | ORAL | 1 refills | Status: DC

## 2016-03-29 NOTE — Telephone Encounter (Signed)
Advised pharmacist that glimepiride is no longer on pt's medication list and is currently taking Glipizide ER. He states they will d/c glimepiride from pt's profile.

## 2016-03-29 NOTE — Telephone Encounter (Signed)
Spoke with pt. She requests rx for 10mg  each, 1 tablet daily. Rx sent.

## 2016-03-29 NOTE — Telephone Encounter (Signed)
Glipizide er needs refills called to [facility] [address]

## 2016-03-29 NOTE — Telephone Encounter (Signed)
Pharmacy needs clarification on whether the pt is to still take the glimiperide and or the glipizide

## 2016-03-31 DIAGNOSIS — M25511 Pain in right shoulder: Secondary | ICD-10-CM | POA: Diagnosis not present

## 2016-03-31 DIAGNOSIS — M25611 Stiffness of right shoulder, not elsewhere classified: Secondary | ICD-10-CM | POA: Diagnosis not present

## 2016-03-31 DIAGNOSIS — M75111 Incomplete rotator cuff tear or rupture of right shoulder, not specified as traumatic: Secondary | ICD-10-CM | POA: Diagnosis not present

## 2016-04-03 DIAGNOSIS — I10 Essential (primary) hypertension: Secondary | ICD-10-CM | POA: Diagnosis not present

## 2016-04-03 DIAGNOSIS — E876 Hypokalemia: Secondary | ICD-10-CM | POA: Diagnosis not present

## 2016-04-03 DIAGNOSIS — E119 Type 2 diabetes mellitus without complications: Secondary | ICD-10-CM | POA: Diagnosis not present

## 2016-04-03 DIAGNOSIS — M545 Low back pain: Secondary | ICD-10-CM | POA: Diagnosis not present

## 2016-04-04 DIAGNOSIS — R5383 Other fatigue: Secondary | ICD-10-CM | POA: Diagnosis not present

## 2016-04-04 DIAGNOSIS — M75111 Incomplete rotator cuff tear or rupture of right shoulder, not specified as traumatic: Secondary | ICD-10-CM | POA: Diagnosis not present

## 2016-04-04 DIAGNOSIS — G4733 Obstructive sleep apnea (adult) (pediatric): Secondary | ICD-10-CM | POA: Diagnosis not present

## 2016-04-04 DIAGNOSIS — M25511 Pain in right shoulder: Secondary | ICD-10-CM | POA: Diagnosis not present

## 2016-04-04 DIAGNOSIS — M25611 Stiffness of right shoulder, not elsewhere classified: Secondary | ICD-10-CM | POA: Diagnosis not present

## 2016-04-04 DIAGNOSIS — G4710 Hypersomnia, unspecified: Secondary | ICD-10-CM | POA: Diagnosis not present

## 2016-04-07 DIAGNOSIS — M25611 Stiffness of right shoulder, not elsewhere classified: Secondary | ICD-10-CM | POA: Diagnosis not present

## 2016-04-07 DIAGNOSIS — M75111 Incomplete rotator cuff tear or rupture of right shoulder, not specified as traumatic: Secondary | ICD-10-CM | POA: Diagnosis not present

## 2016-04-07 DIAGNOSIS — M25511 Pain in right shoulder: Secondary | ICD-10-CM | POA: Diagnosis not present

## 2016-04-11 DIAGNOSIS — M75111 Incomplete rotator cuff tear or rupture of right shoulder, not specified as traumatic: Secondary | ICD-10-CM | POA: Diagnosis not present

## 2016-04-11 DIAGNOSIS — M25511 Pain in right shoulder: Secondary | ICD-10-CM | POA: Diagnosis not present

## 2016-04-11 DIAGNOSIS — M25611 Stiffness of right shoulder, not elsewhere classified: Secondary | ICD-10-CM | POA: Diagnosis not present

## 2016-04-12 DIAGNOSIS — R1084 Generalized abdominal pain: Secondary | ICD-10-CM | POA: Diagnosis not present

## 2016-04-14 DIAGNOSIS — M25611 Stiffness of right shoulder, not elsewhere classified: Secondary | ICD-10-CM | POA: Diagnosis not present

## 2016-04-14 DIAGNOSIS — Z9889 Other specified postprocedural states: Secondary | ICD-10-CM | POA: Diagnosis not present

## 2016-04-14 DIAGNOSIS — M75111 Incomplete rotator cuff tear or rupture of right shoulder, not specified as traumatic: Secondary | ICD-10-CM | POA: Diagnosis not present

## 2016-04-14 DIAGNOSIS — M25511 Pain in right shoulder: Secondary | ICD-10-CM | POA: Diagnosis not present

## 2016-04-18 DIAGNOSIS — M25611 Stiffness of right shoulder, not elsewhere classified: Secondary | ICD-10-CM | POA: Diagnosis not present

## 2016-04-18 DIAGNOSIS — M75111 Incomplete rotator cuff tear or rupture of right shoulder, not specified as traumatic: Secondary | ICD-10-CM | POA: Diagnosis not present

## 2016-04-18 DIAGNOSIS — M25511 Pain in right shoulder: Secondary | ICD-10-CM | POA: Diagnosis not present

## 2016-04-20 ENCOUNTER — Ambulatory Visit (INDEPENDENT_AMBULATORY_CARE_PROVIDER_SITE_OTHER): Payer: Medicare Other | Admitting: Internal Medicine

## 2016-04-20 ENCOUNTER — Encounter: Payer: Self-pay | Admitting: Internal Medicine

## 2016-04-20 VITALS — BP 128/88 | HR 89 | Ht 66.0 in | Wt 188.0 lb

## 2016-04-20 DIAGNOSIS — E1165 Type 2 diabetes mellitus with hyperglycemia: Secondary | ICD-10-CM | POA: Diagnosis not present

## 2016-04-20 DIAGNOSIS — E1159 Type 2 diabetes mellitus with other circulatory complications: Secondary | ICD-10-CM

## 2016-04-20 DIAGNOSIS — I2510 Atherosclerotic heart disease of native coronary artery without angina pectoris: Secondary | ICD-10-CM | POA: Diagnosis not present

## 2016-04-20 LAB — POCT GLYCOSYLATED HEMOGLOBIN (HGB A1C): Hemoglobin A1C: 9.5

## 2016-04-20 MED ORDER — INSULIN DETEMIR 100 UNIT/ML FLEXPEN: 60.0000 [IU] | PEN_INJECTOR | Freq: Every day | SUBCUTANEOUS | 1 refills | Status: DC

## 2016-04-20 NOTE — Progress Notes (Signed)
Patient ID: Bonnie Gordon, female   DOB: [birth year], 60 y.o.   MRN: [id number]   HPI: Bonnie Gordon is a 60 y.o.-year-old female, returning for follow-up for DM2, dx in 201285-, insulin-dependent, uncontrolled, with complications (CAD, PN). Last visit 6 weeks ago.  She had rotator cuff sx 03/14/2016.   Last hemoglobin A1c was: 01/14/2016: HbA1c 11.3% Lab Results  Component Value Date   HGBA1C 8.1 (H) 08/30/2015   HGBA1C 7.1 (H) 02/25/2015   HGBA1C 7.0 (H) 08/26/2014   Pt is on a regimen of: - Levemir 60 units once a day at bedtime. - Glipizide ER 10 mg at bedtime (!)  Prev., Per records from PCP (patient does not remember the names of the medicines): - Glimepiride 4 mg before b'fast/ Glipizide XL 10 mg in am  - Tradjenta 5 mg in am These were stopped when she started insulin.  She was on Metformin >> diarrhea  Pt checks her sugars 2x a day: - am: 85-109 >> 120-148 - 2h after b'fast: n/c - before lunch: n/c >> 130s - 2h after lunch: n/c - before dinner: 120s >> 130s - 2h after dinner: n/c - bedtime: n/c - nighttime: n/c No lows. Lowest sugar was 80s >> 97; ? hypoglycemia awareness. Highest sugar was 277 >> 309 (sweet tea).  Glucometer: AccuChek Aviva  Pt's meals are: - Breakfast:Eggs, bacon, ham, oatmeal, pancakes, grits - Lunch: Burger - Dinner: Meat - Snacks: 1  - no CKD, last BUN/creatinine:  01/13/2016: 12/0.83, glucose 472 Lab Results  Component Value Date   BUN 12 01/15/2016   BUN 9 09/19/2015   CREATININE 1.00 01/15/2016   CREATININE 0.74 09/19/2015   - last set of lipids: 01/13/2016: 147/218/73/69 Lab Results  Component Value Date   CHOL 141 02/29/2016   HDL 67 02/29/2016   LDLCALC 55 02/29/2016   TRIG 94 02/29/2016   CHOLHDL 2.1 02/29/2016  On Crestor 40. - last eye exam was in 12/2015. No DR. + small cataract. - + numbness and tingling in her feet.  She sees podiatry.  Last TSH: 01/13/2016: TSH 1.39 Lab Results  Component Value Date   TSH  0.835 08/30/2015   ROS: Constitutional: + weight gain, no fatigue, no hot flushes, no poor sleep Eyes: no blurry vision, no xerophthalmia ENT: no sore throat, no nodules palpated in throat, + dysphagia/no odynophagia, no hoarseness, + decreased hearing Cardiovascular: no CP/no palpitations/leg swelling Respiratory: no cough/+ SOB Gastrointestinal: no N/V/D/C/acid reflux Musculoskeletal: no muscle/joint aches Skin: no rashes Neurological: no tremors/numbness/tingling/dizziness, + headaches  I reviewed pt's medications, allergies, PMH, social hx, family hx, and changes were documented in the history of present illness. Otherwise, unchanged from my initial visit note.  Past Medical History:  Diagnosis Date  . Anxiety   . Bipolar disorder (HCC)   . Chest pain    a. 01/2003 MV w/ ? ant-inflat ischemia-->Cath: LAD 20p, otw nonobs dzs-->Med Rx; b. 2009 Cath: LAD 30, LCX nl/nondominant, RCA 30, nl EF; c. 08/2014 MV: no ischemia/infarct.  . Complication of anesthesia    pt. states she is difficult to wake up  . Depression   . Diabetes mellitus    Type II  . Diabetic neuropathy (HCC)   . Diverticulitis   . GERD (gastroesophageal reflux disease)   . Headache   . History of bronchitis   . Hypercholesteremia   . Hypertension   . Hypokalemia   . PONV (postoperative nausea and vomiting)   . Shortness of breath dyspnea   . Sleep apnea   .  Vertigo    patient reported   Past Surgical History:  Procedure Laterality Date  . ABDOMINAL HYSTERECTOMY    . APPENDECTOMY    . CARDIAC CATHETERIZATION  2009   normal cath  . CARDIAC CATHETERIZATION  2004   normal cath  . CHOLECYSTECTOMY    . TONSILLECTOMY/ADENOIDECTOMY/TURBINATE REDUCTION Bilateral 03/01/2015   Procedure: TONSILLECTOMY/TURBINATE REDUCTION;  Surgeon: Drema Halonhristopher E Newman, MD;  [facility];  Service: ENT;  Laterality: Bilateral;  . UVULOPALATOPHARYNGOPLASTY Bilateral 03/01/2015   Procedure: UVULOPALATOPHARYNGOPLASTY (UPPP);   Surgeon: Drema Halonhristopher E Newman, MD;  [facility];  Service: ENT;  Laterality: Bilateral;  . WISDOM TOOTH EXTRACTION     Social History   Social History  . Marital status: Divorced    Spouse name: N/A  . Number of children: 2   Occupational History  . Disability    Social History Main Topics  . Smoking status: Former Smoker    Quit date: 08/07/1982  . Smokeless tobacco: Never Used  . Alcohol use Yes     Comment: Drinks during the weekends 1 beer or 1 drink of liquor per day   . Drug use: No  . Sexual activity: Yes    Partners: Male    Birth control/ protection: Surgical   Current Outpatient Prescriptions on File Prior to Visit  Medication Sig Dispense Refill  . ACCU-CHEK AVIVA PLUS test strip   5  . ACCU-CHEK SOFTCLIX LANCETS lancets   5  . acidophilus (RISAQUAD) CAPS capsule Take 1 capsule by mouth daily.    Marland Kitchen. amLODipine (NORVASC) 5 MG tablet TAKE 1 TABLET BY MOUTH EVERY DAY 90 tablet 1  . BOOSTRIX 5-2.5-18.5 injection TO BE ADMINISTERED BY PHARMACIST FOR IMMUNIZATION  0  . celecoxib (CELEBREX) 200 MG capsule Take 200 mg by mouth 2 (two) times daily.    . fluticasone (FLONASE) 50 MCG/ACT nasal spray Place 2 sprays into both nostrils daily. 16 g 0  . glipiZIDE (GLUCOTROL XL) 10 MG 24 hr tablet Take 1 tablet (10 mg total) by mouth daily with breakfast. 90 tablet 1  . hydrochlorothiazide (MICROZIDE) 12.5 MG capsule TAKE ONE CAPSULE BY MOUTH EVERY DAY 30 capsule 6  . Insulin Detemir (LEVEMIR FLEXTOUCH) 100 UNIT/ML Pen Inject 60 Units into the skin daily at 10 pm. 15 mL 2  . KLOR-CON M20 20 MEQ tablet     . meloxicam (MOBIC) 7.5 MG tablet Take 7.5 mg by mouth daily.    . metroNIDAZOLE (FLAGYL) 500 MG tablet Take 1 tablet (500 mg total) by mouth 2 (two) times daily. One po bid x 7 days (Patient not taking: Reported on 03/01/2016) 14 tablet 0  . Multiple Vitamins-Minerals (MULTIVITAMIN WITH MINERALS) tablet Take 1 tablet by mouth daily.    . mupirocin ointment (BACTROBAN) 2 % Place 1  application into the nose 2 (two) times daily. (Patient not taking: Reported on 03/01/2016) 22 g 0  . rosuvastatin (CRESTOR) 40 MG tablet Take 1 tablet (40 mg total) by mouth daily. 90 tablet 3  . VENTOLIN HFA 108 (90 BASE) MCG/ACT inhaler Inhale 2 puffs into the lungs every 4 (four) hours as needed for wheezing or shortness of breath.   0   No current facility-administered medications on file prior to visit.    Allergies  Allergen Reactions  . Other Other (See Comments)    No Blood Products, personal preference  . Asa [Aspirin] Other (See Comments)    Bruises easily  . Latex Itching and Rash   Family History  Problem  Relation Age of Onset  . Diabetes Mother   . Gout Mother   . Hypertension Mother   . Arthritis Mother   . Heart disease Father   . Heart failure Sister   . Arthritis Sister   . Heart disease Maternal Grandmother   . Diabetes Maternal Grandmother   . Heart disease Paternal Grandmother   . Diabetes Paternal Grandmother   . Diabetes Sister   . Bursitis Sister   . Crohn's disease Sister    PE: BP 128/88 (BP Location: Left Arm, Patient Position: Sitting)   Pulse 89   Ht 5\' 6"  (1.676 m)   Wt 188 lb (85.3 kg)   SpO2 95%   BMI 30.34 kg/m  Wt Readings from Last 3 Encounters:  04/20/16 188 lb (85.3 kg)  03/09/16 181 lb (82.1 kg)  03/01/16 181 lb (82.1 kg)   Constitutional: overweight, in NAD Eyes: PERRLA, EOMI, no exophthalmos ENT: moist mucous membranes, no thyromegaly, no cervical lymphadenopathy Cardiovascular: RRR, No MRG Respiratory: CTA B Gastrointestinal: abdomen soft, NT, ND, BS+ Musculoskeletal: no deformities, strength intact in all 4 Skin: moist, warm, no rashes Neurological: no tremor with outstretched hands, DTR normal in all 4  ASSESSMENT: 1. DM2, insulin-dependent, uncontrolled, with complications - CAD - PN  PLAN:  1. Patient with long-standing, uncontrolled diabetes, previously on oral antidiabetic regimen, which became insufficient >>  was on a large dose of basal insulin (120 units a day) >> Sugars much lower compared to before, but + occasional lows and also spikes of blood sugars in the 200s. At last visit, we decreased the Levemir to half and added glipizide extended-release. Her sugars are slightly higher than goal, however, she is taking the Glipizide at bedtime (!) >> will move this to am. - I suggested to:  Patient Instructions  Please continue Levemir 60 units at bedtime.  Move Glipizide ER 10 mg before breakfast.  Please return in 3 months with your sugar log.   - continue to check sugars at different times of the day - check 2 times a day, rotating checks - advised for yearly eye exams >> she is UTD - checked HbA1c today >> 9.5% (much better) - Return to clinic in 3 mo with sugar log   Carlus Pavlov, MD PhD [facility] Endocrinology

## 2016-04-20 NOTE — Patient Instructions (Addendum)
Please continue Levemir 60 units at bedtime.  Move Glipizide ER 10 mg before breakfast.  Please return in 3 months with your sugar log.

## 2016-04-20 NOTE — Addendum Note (Signed)
Addended by: Darene LamerHOMPSON, Aneli Zara T on: 04/20/2016 11:04 AM   Modules accepted: Orders

## 2016-04-21 DIAGNOSIS — M75111 Incomplete rotator cuff tear or rupture of right shoulder, not specified as traumatic: Secondary | ICD-10-CM | POA: Diagnosis not present

## 2016-04-21 DIAGNOSIS — M25611 Stiffness of right shoulder, not elsewhere classified: Secondary | ICD-10-CM | POA: Diagnosis not present

## 2016-04-21 DIAGNOSIS — M25511 Pain in right shoulder: Secondary | ICD-10-CM | POA: Diagnosis not present

## 2016-04-24 DIAGNOSIS — M25511 Pain in right shoulder: Secondary | ICD-10-CM | POA: Diagnosis not present

## 2016-04-24 DIAGNOSIS — E119 Type 2 diabetes mellitus without complications: Secondary | ICD-10-CM | POA: Diagnosis not present

## 2016-04-24 DIAGNOSIS — H31093 Other chorioretinal scars, bilateral: Secondary | ICD-10-CM | POA: Diagnosis not present

## 2016-04-24 DIAGNOSIS — M25611 Stiffness of right shoulder, not elsewhere classified: Secondary | ICD-10-CM | POA: Diagnosis not present

## 2016-04-24 DIAGNOSIS — M75111 Incomplete rotator cuff tear or rupture of right shoulder, not specified as traumatic: Secondary | ICD-10-CM | POA: Diagnosis not present

## 2016-04-26 ENCOUNTER — Other Ambulatory Visit: Payer: Self-pay | Admitting: Cardiovascular Disease

## 2016-04-27 DIAGNOSIS — M25511 Pain in right shoulder: Secondary | ICD-10-CM | POA: Diagnosis not present

## 2016-04-27 DIAGNOSIS — M25611 Stiffness of right shoulder, not elsewhere classified: Secondary | ICD-10-CM | POA: Diagnosis not present

## 2016-04-27 DIAGNOSIS — M75111 Incomplete rotator cuff tear or rupture of right shoulder, not specified as traumatic: Secondary | ICD-10-CM | POA: Diagnosis not present

## 2016-04-28 DIAGNOSIS — Z01419 Encounter for gynecological examination (general) (routine) without abnormal findings: Secondary | ICD-10-CM | POA: Diagnosis not present

## 2016-05-01 DIAGNOSIS — M25511 Pain in right shoulder: Secondary | ICD-10-CM | POA: Diagnosis not present

## 2016-05-01 DIAGNOSIS — M25611 Stiffness of right shoulder, not elsewhere classified: Secondary | ICD-10-CM | POA: Diagnosis not present

## 2016-05-01 DIAGNOSIS — M75111 Incomplete rotator cuff tear or rupture of right shoulder, not specified as traumatic: Secondary | ICD-10-CM | POA: Diagnosis not present

## 2016-05-03 DIAGNOSIS — R5383 Other fatigue: Secondary | ICD-10-CM | POA: Diagnosis not present

## 2016-05-03 DIAGNOSIS — M545 Low back pain: Secondary | ICD-10-CM | POA: Diagnosis not present

## 2016-05-03 DIAGNOSIS — I10 Essential (primary) hypertension: Secondary | ICD-10-CM | POA: Diagnosis not present

## 2016-05-03 DIAGNOSIS — M25611 Stiffness of right shoulder, not elsewhere classified: Secondary | ICD-10-CM | POA: Diagnosis not present

## 2016-05-03 DIAGNOSIS — E119 Type 2 diabetes mellitus without complications: Secondary | ICD-10-CM | POA: Diagnosis not present

## 2016-05-03 DIAGNOSIS — M25511 Pain in right shoulder: Secondary | ICD-10-CM | POA: Diagnosis not present

## 2016-05-03 DIAGNOSIS — M75111 Incomplete rotator cuff tear or rupture of right shoulder, not specified as traumatic: Secondary | ICD-10-CM | POA: Diagnosis not present

## 2016-05-08 DIAGNOSIS — M75111 Incomplete rotator cuff tear or rupture of right shoulder, not specified as traumatic: Secondary | ICD-10-CM | POA: Diagnosis not present

## 2016-05-08 DIAGNOSIS — M25611 Stiffness of right shoulder, not elsewhere classified: Secondary | ICD-10-CM | POA: Diagnosis not present

## 2016-05-08 DIAGNOSIS — M25511 Pain in right shoulder: Secondary | ICD-10-CM | POA: Diagnosis not present

## 2016-05-10 DIAGNOSIS — M25511 Pain in right shoulder: Secondary | ICD-10-CM | POA: Diagnosis not present

## 2016-05-10 DIAGNOSIS — M25611 Stiffness of right shoulder, not elsewhere classified: Secondary | ICD-10-CM | POA: Diagnosis not present

## 2016-05-10 DIAGNOSIS — M75111 Incomplete rotator cuff tear or rupture of right shoulder, not specified as traumatic: Secondary | ICD-10-CM | POA: Diagnosis not present

## 2016-05-15 DIAGNOSIS — M25611 Stiffness of right shoulder, not elsewhere classified: Secondary | ICD-10-CM | POA: Diagnosis not present

## 2016-05-15 DIAGNOSIS — M75111 Incomplete rotator cuff tear or rupture of right shoulder, not specified as traumatic: Secondary | ICD-10-CM | POA: Diagnosis not present

## 2016-05-15 DIAGNOSIS — M25511 Pain in right shoulder: Secondary | ICD-10-CM | POA: Diagnosis not present

## 2016-05-17 DIAGNOSIS — M25511 Pain in right shoulder: Secondary | ICD-10-CM | POA: Diagnosis not present

## 2016-05-17 DIAGNOSIS — M545 Low back pain: Secondary | ICD-10-CM | POA: Diagnosis not present

## 2016-05-17 DIAGNOSIS — M25611 Stiffness of right shoulder, not elsewhere classified: Secondary | ICD-10-CM | POA: Diagnosis not present

## 2016-05-17 DIAGNOSIS — M75111 Incomplete rotator cuff tear or rupture of right shoulder, not specified as traumatic: Secondary | ICD-10-CM | POA: Diagnosis not present

## 2016-05-17 DIAGNOSIS — M7061 Trochanteric bursitis, right hip: Secondary | ICD-10-CM | POA: Diagnosis not present

## 2016-05-18 ENCOUNTER — Telehealth: Payer: Self-pay

## 2016-05-18 ENCOUNTER — Telehealth: Payer: Self-pay | Admitting: Internal Medicine

## 2016-05-18 MED ORDER — GLIPIZIDE 5 MG PO TABS
5.0000 mg | ORAL_TABLET | Freq: Every day | ORAL | 0 refills | Status: DC
Start: 2016-05-18 — End: 2016-08-16

## 2016-05-18 NOTE — Telephone Encounter (Signed)
Called patient and notified of the increase in insulins and increase in glipizide.. Patient agreed and will call back next week with sugars.

## 2016-05-18 NOTE — Telephone Encounter (Signed)
Pt still having BS reading ranging from 200-450

## 2016-05-18 NOTE — Telephone Encounter (Signed)
Those sugars are much higher than at last visit! Let's go ahead and increase her basal insulin from 60 to 70 units and may need to even increase to 80 units if sugars not better over the weekend. Let's also add half of a glipizide tablet (5 mg) before dinner. Please let us know about her sugars next week.

## 2016-05-22 DIAGNOSIS — M25611 Stiffness of right shoulder, not elsewhere classified: Secondary | ICD-10-CM | POA: Diagnosis not present

## 2016-05-22 DIAGNOSIS — M25511 Pain in right shoulder: Secondary | ICD-10-CM | POA: Diagnosis not present

## 2016-05-22 DIAGNOSIS — M75111 Incomplete rotator cuff tear or rupture of right shoulder, not specified as traumatic: Secondary | ICD-10-CM | POA: Diagnosis not present

## 2016-05-25 ENCOUNTER — Emergency Department (HOSPITAL_COMMUNITY): Payer: Medicare Other

## 2016-05-25 ENCOUNTER — Telehealth: Payer: Self-pay | Admitting: Cardiology

## 2016-05-25 ENCOUNTER — Encounter (HOSPITAL_COMMUNITY): Payer: Self-pay

## 2016-05-25 ENCOUNTER — Observation Stay (HOSPITAL_COMMUNITY)
Admission: EM | Admit: 2016-05-25 | Discharge: 2016-05-26 | Disposition: A | Discharge status: Home / Self Care | Payer: Medicare Other | Attending: Internal Medicine | Admitting: Internal Medicine

## 2016-05-25 DIAGNOSIS — G4733 Obstructive sleep apnea (adult) (pediatric): Secondary | ICD-10-CM | POA: Diagnosis present

## 2016-05-25 DIAGNOSIS — Z794 Long term (current) use of insulin: Secondary | ICD-10-CM | POA: Insufficient documentation

## 2016-05-25 DIAGNOSIS — Z7984 Long term (current) use of oral hypoglycemic drugs: Secondary | ICD-10-CM | POA: Diagnosis not present

## 2016-05-25 DIAGNOSIS — E1140 Type 2 diabetes mellitus with diabetic neuropathy, unspecified: Secondary | ICD-10-CM | POA: Diagnosis not present

## 2016-05-25 DIAGNOSIS — I25119 Atherosclerotic heart disease of native coronary artery with unspecified angina pectoris: Secondary | ICD-10-CM | POA: Diagnosis not present

## 2016-05-25 DIAGNOSIS — Z79899 Other long term (current) drug therapy: Secondary | ICD-10-CM | POA: Insufficient documentation

## 2016-05-25 DIAGNOSIS — Z955 Presence of coronary angioplasty implant and graft: Secondary | ICD-10-CM | POA: Insufficient documentation

## 2016-05-25 DIAGNOSIS — Z91040 Latex allergy status: Secondary | ICD-10-CM | POA: Diagnosis not present

## 2016-05-25 DIAGNOSIS — I4510 Unspecified right bundle-branch block: Secondary | ICD-10-CM | POA: Diagnosis present

## 2016-05-25 DIAGNOSIS — I10 Essential (primary) hypertension: Secondary | ICD-10-CM | POA: Diagnosis not present

## 2016-05-25 DIAGNOSIS — E1159 Type 2 diabetes mellitus with other circulatory complications: Secondary | ICD-10-CM | POA: Diagnosis not present

## 2016-05-25 DIAGNOSIS — Z5181 Encounter for therapeutic drug level monitoring: Secondary | ICD-10-CM | POA: Diagnosis not present

## 2016-05-25 DIAGNOSIS — E1165 Type 2 diabetes mellitus with hyperglycemia: Secondary | ICD-10-CM | POA: Diagnosis present

## 2016-05-25 DIAGNOSIS — R0789 Other chest pain: Secondary | ICD-10-CM | POA: Diagnosis not present

## 2016-05-25 DIAGNOSIS — I2510 Atherosclerotic heart disease of native coronary artery without angina pectoris: Secondary | ICD-10-CM | POA: Diagnosis not present

## 2016-05-25 DIAGNOSIS — Z87891 Personal history of nicotine dependence: Secondary | ICD-10-CM | POA: Insufficient documentation

## 2016-05-25 DIAGNOSIS — R079 Chest pain, unspecified: Secondary | ICD-10-CM

## 2016-05-25 DIAGNOSIS — R0781 Pleurodynia: Secondary | ICD-10-CM | POA: Diagnosis present

## 2016-05-25 DIAGNOSIS — E785 Hyperlipidemia, unspecified: Secondary | ICD-10-CM | POA: Diagnosis present

## 2016-05-25 DIAGNOSIS — E1169 Type 2 diabetes mellitus with other specified complication: Secondary | ICD-10-CM | POA: Diagnosis present

## 2016-05-25 LAB — CBC
HCT: 43.4 % (ref 36.0–46.0)
Hemoglobin: 14.6 g/dL (ref 12.0–15.0)
MCH: 27.9 pg (ref 26.0–34.0)
MCHC: 33.6 g/dL (ref 30.0–36.0)
MCV: 82.8 fL (ref 78.0–100.0)
Platelets: 171 10*3/uL (ref 150–400)
RBC: 5.24 MIL/uL — ABNORMAL HIGH (ref 3.87–5.11)
RDW: 13.7 % (ref 11.5–15.5)
WBC: 6.5 10*3/uL (ref 4.0–10.5)

## 2016-05-25 LAB — COMPREHENSIVE METABOLIC PANEL
ALT: 40 U/L (ref 14–54)
AST: 31 U/L (ref 15–41)
Albumin: 3.8 g/dL (ref 3.5–5.0)
Alkaline Phosphatase: 106 U/L (ref 38–126)
Anion gap: 9 (ref 5–15)
BUN: 6 mg/dL (ref 6–20)
CO2: 27 mmol/L (ref 22–32)
Calcium: 9.1 mg/dL (ref 8.9–10.3)
Chloride: 105 mmol/L (ref 101–111)
Creatinine, Ser: 0.65 mg/dL (ref 0.44–1.00)
GFR calc Af Amer: >60 mL/min (ref >60)
GFR calc non Af Amer: >60 mL/min (ref >60)
Glucose, Bld: 77 mg/dL (ref 65–99)
Potassium: 3.3 mmol/L — ABNORMAL LOW (ref 3.5–5.1)
Sodium: 141 mmol/L (ref 135–145)
Total Bilirubin: 0.3 mg/dL (ref 0.3–1.2)
Total Protein: 7.1 g/dL (ref 6.5–8.1)

## 2016-05-25 LAB — PROTIME-INR
INR: 0.96
Prothrombin Time: 12.8 seconds (ref 11.4–15.2)

## 2016-05-25 LAB — I-STAT TROPONIN, ED: Troponin i, poc: 0 ng/mL (ref 0.00–0.08)

## 2016-05-25 LAB — TROPONIN I
Troponin I: <0.03 ng/mL (ref <0.03)
Troponin I: <0.03 ng/mL (ref <0.03)

## 2016-05-25 LAB — PHOSPHORUS: Phosphorus: 3.2 mg/dL (ref 2.5–4.6)

## 2016-05-25 LAB — CBG MONITORING, ED
Glucose-Capillary: 78 mg/dL (ref 65–99)
Glucose-Capillary: 65 mg/dL (ref 65–99)

## 2016-05-25 LAB — MAGNESIUM
Magnesium: 2.2 mg/dL (ref 1.7–2.4)
Magnesium: 2.1 mg/dL (ref 1.7–2.4)

## 2016-05-25 LAB — GLUCOSE, CAPILLARY
Glucose-Capillary: 209 mg/dL — ABNORMAL HIGH (ref 65–99)
Glucose-Capillary: 222 mg/dL — ABNORMAL HIGH (ref 65–99)

## 2016-05-25 LAB — D-DIMER, QUANTITATIVE: D-Dimer, Quant: 0.36 ug/mL-FEU (ref 0.00–0.50)

## 2016-05-25 LAB — APTT: aPTT: 29 seconds (ref 24–36)

## 2016-05-25 MED ORDER — POTASSIUM CITRATE ER 10 MEQ (1080 MG) PO TBCR
10.0000 meq | EXTENDED_RELEASE_TABLET | Freq: Once | ORAL | Status: AC
Start: 2016-05-25 — End: 2016-05-25
  Administered 2016-05-25: 10 meq via ORAL
  Filled 2016-05-25 (×2): qty 1

## 2016-05-25 MED ORDER — ASPIRIN EC 81 MG PO TBEC
81.0000 mg | DELAYED_RELEASE_TABLET | Freq: Every day | ORAL | Status: DC
  Administered 2016-05-26: 81 mg via ORAL
  Filled 2016-05-25: qty 1

## 2016-05-25 MED ORDER — ROSUVASTATIN CALCIUM 10 MG PO TABS
40.0000 mg | ORAL_TABLET | Freq: Every day | ORAL | Status: DC
  Administered 2016-05-25: 40 mg via ORAL
  Filled 2016-05-25: qty 4

## 2016-05-25 MED ORDER — INSULIN ASPART 100 UNIT/ML ~~LOC~~ SOLN
0.0000 [IU] | Freq: Three times a day (TID) | SUBCUTANEOUS | Status: DC
  Administered 2016-05-25: 5 [IU] via SUBCUTANEOUS

## 2016-05-25 MED ORDER — MORPHINE SULFATE (PF) 2 MG/ML IV SOLN
2.0000 mg | INTRAVENOUS | Status: DC | PRN
  Administered 2016-05-25 – 2016-05-26 (×2): 2 mg via INTRAVENOUS
  Filled 2016-05-25 (×2): qty 1

## 2016-05-25 MED ORDER — ASPIRIN 81 MG PO CHEW
324.0000 mg | CHEWABLE_TABLET | Freq: Once | ORAL | Status: AC
  Administered 2016-05-25: 324 mg via ORAL
  Filled 2016-05-25: qty 4

## 2016-05-25 MED ORDER — INSULIN DETEMIR 100 UNIT/ML FLEXPEN: 30.0000 [IU] | PEN_INJECTOR | Freq: Every day | SUBCUTANEOUS | Status: DC

## 2016-05-25 MED ORDER — INSULIN DETEMIR 100 UNIT/ML ~~LOC~~ SOLN
30.0000 [IU] | Freq: Every day | SUBCUTANEOUS | Status: DC
  Administered 2016-05-25: 30 [IU] via SUBCUTANEOUS
  Filled 2016-05-25 (×2): qty 0.3

## 2016-05-25 MED ORDER — ACETAMINOPHEN 500 MG PO TABS
1000.0000 mg | ORAL_TABLET | Freq: Once | ORAL | Status: AC
  Administered 2016-05-25: 1000 mg via ORAL
  Filled 2016-05-25: qty 2

## 2016-05-25 MED ORDER — ACETAMINOPHEN 325 MG PO TABS: 650.0000 mg | ORAL_TABLET | ORAL | Status: DC | PRN

## 2016-05-25 MED ORDER — NITROGLYCERIN 0.4 MG SL SUBL
0.4000 mg | SUBLINGUAL_TABLET | SUBLINGUAL | Status: DC | PRN
  Administered 2016-05-25 (×3): 0.4 mg via SUBLINGUAL
  Filled 2016-05-25: qty 1

## 2016-05-25 MED ORDER — DICLOFENAC SODIUM 1 % TD GEL
2.0000 g | Freq: Four times a day (QID) | TRANSDERMAL | Status: DC
  Administered 2016-05-25 – 2016-05-26 (×4): 2 g via TOPICAL
  Filled 2016-05-25: qty 100

## 2016-05-25 MED ORDER — NITROGLYCERIN 0.4 MG SL SUBL: 0.4000 mg | SUBLINGUAL_TABLET | SUBLINGUAL | Status: DC | PRN

## 2016-05-25 MED ORDER — ENOXAPARIN SODIUM 40 MG/0.4ML ~~LOC~~ SOLN
40.0000 mg | SUBCUTANEOUS | Status: DC
  Administered 2016-05-25: 40 mg via SUBCUTANEOUS
  Filled 2016-05-25: qty 0.4

## 2016-05-25 MED ORDER — GI COCKTAIL ~~LOC~~
30.0000 mL | Freq: Once | ORAL | Status: AC
  Administered 2016-05-25: 30 mL via ORAL
  Filled 2016-05-25: qty 30

## 2016-05-25 MED ORDER — ONDANSETRON HCL 4 MG/2ML IJ SOLN
4.0000 mg | Freq: Four times a day (QID) | INTRAMUSCULAR | Status: DC | PRN
  Administered 2016-05-25: 4 mg via INTRAVENOUS
  Filled 2016-05-25: qty 2

## 2016-05-25 MED ORDER — POTASSIUM CHLORIDE 10 MEQ/100ML IV SOLN
10.0000 meq | INTRAVENOUS | Status: AC
  Administered 2016-05-25 (×3): 10 meq via INTRAVENOUS
  Filled 2016-05-25 (×3): qty 100

## 2016-05-25 NOTE — ED Notes (Signed)
Heart healthy diet tray ordered for patient.  

## 2016-05-25 NOTE — ED Triage Notes (Signed)
Pt reports right side chest pain with radiation into the right arm that started today. Pt reports hx of CAD and family hx of risk factors.

## 2016-05-25 NOTE — H&P (Signed)
Date: 05/25/2016               Patient Name:  Bonnie Gordon MRN: [id number]  DOB: 17-Jan-1956 Age / Sex: 60 y.o., female   PCP: Leilani Able, MD         Medical Service: Internal Medicine Teaching Service         Attending Physician: Dr. Earl Lagos, MD    First Contact: Dr. Obie Dredge Pager: [phone]  Second Contact: Dr. Karma Greaser Pager: [phone]       After Hours (After 5p/  First Contact Pager: [phone]  weekends / holidays): Second Contact Pager: [phone]   Chief Complaint: Chest pain   History of Present Illness: 60 year-old female with past medical history of hypertension, hyperlipidemia, diabetes, sleep apnea and anxiety presents with chest pain. Chest pain woke her up from sleep this morning and feels like a dull ache. Pain radiated down her right arm to her hand and up her right jaw. It was associated with diaphoresis but no nausea and vomiting. When she got up to put her clothes on she noted shortness of breath.   she then drove herself to the ED where she was given 2 sublingual nitroglycerin and 325 mg of aspirin. This helped ease her chest pain but did not completely resolve it. Taking a deep breath or moving her right arm worsens chest pain. She recently had right shoulder surgery in August and has been working with physical therapy for this. She denies recent heavy exertion. She follows with cardiology and recently had a stress test in August of this year that was negative for ischemia EF of 49%. She reports having intermittent chest pain similar to this episode has progressively worsened over the past month, as well as sharp shooting pains on bilateral chest wall going on for the past year and has worsened. The sharp shooting chest pain was will occur at rest or on exertion.  In the ED labs unremarkable aside from potassium of 3.3 that was repleted w/ KCL 3 runs and potassium citrate . Troponin negative and EKG unchanged from prior. D-dimer wnl for age adjusted limit. She was  admitted for ACS r/o.    Meds:  Current Meds  Medication Sig  . amLODipine (NORVASC) 5 MG tablet Take 1 tablet (5 mg total) by mouth daily.  . celecoxib (CELEBREX) 200 MG capsule Take 200 mg by mouth daily as needed.   Marland Kitchen glipiZIDE (GLUCOTROL XL) 10 MG 24 hr tablet Take 1 tablet (10 mg total) by mouth daily with breakfast.  . glipiZIDE (GLUCOTROL) 5 MG tablet Take 1 tablet (5 mg total) by mouth daily with supper. Add 5 mg at dinner daily along with the 10 mg at breakfast.  . hydrochlorothiazide (MICROZIDE) 12.5 MG capsule TAKE ONE CAPSULE BY MOUTH EVERY DAY  . Insulin Detemir (LEVEMIR FLEXTOUCH) 100 UNIT/ML Pen Inject 60 Units into the skin daily at 10 pm. (Patient taking differently: Inject 70 Units into the skin daily at 10 pm. )  . rosuvastatin (CRESTOR) 40 MG tablet Take 1 tablet (40 mg total) by mouth daily. (Patient taking differently: Take 40 mg by mouth at bedtime. )  . valACYclovir (VALTREX) 500 MG tablet Take 500 mg by mouth daily.  . VENTOLIN HFA 108 (90 BASE) MCG/ACT inhaler Inhale 2 puffs into the lungs every 4 (four) hours as needed for wheezing or shortness of breath.      Allergies: Allergies as of 05/25/2016 - Review Complete 05/25/2016  Allergen Reaction Noted  .  Other Other (See Comments) 03/01/2015  . Latex Itching and Rash 10/04/2011   Past Medical History:  Diagnosis Date  . Anxiety   . Bipolar disorder (HCC)   . Chest pain    a. 01/2003 MV w/ ? ant-inflat ischemia-->Cath: LAD 20p, otw nonobs dzs-->Med Rx; b. 2009 Cath: LAD 30, LCX nl/nondominant, RCA 30, nl EF; c. 08/2014 MV: no ischemia/infarct.  . Complication of anesthesia    pt. states she is difficult to wake up  . Depression   . Diabetes mellitus    Type II  . Diabetic neuropathy (HCC)   . Diverticulitis   . GERD (gastroesophageal reflux disease)   . Headache   . History of bronchitis   . Hypercholesteremia   . Hypertension   . Hypokalemia   . PONV (postoperative nausea and vomiting)   . Shortness  of breath dyspnea   . Sleep apnea   . Vertigo    patient reported    Family History  Problem Relation Age of Onset  . Diabetes Mother   . Gout Mother   . Hypertension Mother   . Arthritis Mother   . Heart disease Father   . Heart failure Sister   . Arthritis Sister   . Heart disease Maternal Grandmother   . Diabetes Maternal Grandmother   . Heart disease Paternal Grandmother   . Diabetes Paternal Grandmother   . Diabetes Sister   . Bursitis Sister   . Crohn's disease Sister     Social History: She has 2 healthy children. Does not work. Denies tobacco and illicit drug use. Drinks EtOH socially.  Review of Systems: A complete ROS was negative except as per HPI.   Physical Exam: Blood pressure 116/79, pulse 65, temperature 97.8 F (36.6 C), temperature source Oral, resp. rate 14, SpO2 98 %. Physical Exam  Constitutional: She is oriented to person, place, and time. She appears well-developed and well-nourished. No distress.  HENT:  Head: Normocephalic and atraumatic.  Nose: Nose normal.  Eyes: Conjunctivae and EOM are normal. No scleral icterus.  Neck: Normal range of motion. No JVD present.  Cardiovascular: Normal rate, regular rhythm and normal heart sounds.  Exam reveals no gallop and no friction rub.   No murmur heard. Pulmonary/Chest: Effort normal and breath sounds normal. No stridor. No respiratory distress. She has no wheezes. She has no rales. She exhibits tenderness (rt chest wall TTP and reproduces CP she had this morning).  Abdominal: Soft. Bowel sounds are normal. She exhibits no distension and no mass. There is no tenderness. There is no rebound and no guarding.  Neurological: She is alert and oriented to person, place, and time. No cranial nerve deficit.  Skin: Skin is warm and dry. No rash noted. She is not diaphoretic. No erythema. No pallor.  Psychiatric: She has a normal mood and affect. Her behavior is normal. Thought content normal.     EKG: nsr, q  waves in V1-2, unchanged from 02/29/16  CXR: neg for acute cardiopulmonary disease  Assessment & Plan by Problem: Principal Problem:   Chest pain Active Problems:   CAD (coronary artery disease)   Essential hypertension   Hyperlipidemia LDL goal <70   Obstructive apnea   RBBB   Poorly controlled type 2 diabetes mellitus with circulatory disorder (HCC)  Chest pain-- pt presenting with atypical CP w/ negative troponin and EKG that is unchanged from prior w/o signs of ischemia. She has significant RF including HTN, HLD, DM, and family hx. On exam her CP  is reproducible w/ rt chest wall palpation. She had recent should sx in August and states her shoulder pain is well controlled w/ tylenol. She follows w/ cardiology w/ negative myoview stress test 2 months ago, per most recent cardiology note in July ahd had a cardiac cath in 2009 that revealed mild non obstructive LAD and RCA disease. Pt was previously on asa but was taken off of it for reasons she cannot remember but she denies GI bleeding. Likely CP is MSK en light of recent negative stress test in August, reproducible chest pain on chest wall palpation, and the fact CP did not resolve w/ NG.  - admit to tele - trend tropoins - morning EKG - voltaren gel for chest wall pain - hh/carb mod diet, NPO at MN - restart asa 81 mg daily - NG prn for chest pain - consider starting beta blocker    HLD- LDL 55 on 02/29/16 - continue crestor 40mg   Hypertension- normotensive, hold home norvasc 5mg , and HCTZ 12.5mg   OSA-- CPAP  DM2--follows with endocrinology. Last A1c was 9.5 in September 2017. On glipizide 5 mg at night and 10 mg with breakfast, Levemir 60 units daily. - Levemir 30 units daily -Sliding scale   FEN- - K 3.3, repleted in the ED, f/u morning BMET - hh/carb diet  DVT ppx- lovenox Code: full Dispo: Admit patient to Observation with expected length of stay less than 2 midnights.  Signed: Denton Brickiana M Vy Badley, MD 05/25/2016, 10:34  AM  Pager: [phone]

## 2016-05-25 NOTE — ED Provider Notes (Signed)
[facility]-EMER[facility]ENCY DEPT Provider Note  CSN: [id number] Arrival Date & Time: 05/25/16 @ 0808  History    Chief Complaint Chief Complaint  Patient presents with  . Chest Pain    HPI Bonnie Gordon is a 60 y.o. female.  Patient presents to the ED via private vehicle for assessment of chest pain that started at 6 AM. Awoke patient from sleep. Quality described as sharpe and stabbing that radiates into the right arm and the right neck. Modifying factors are worse with right arm movement but also endorses she has symptoms when she ambulates. Better when sitting still. Endorses mild pleuritic nature.  Patient has previous Diagnosis of coronary artery disease with left heart cath that showed 20% obstruction to the LAD and otherwise nonobstructive disease. Patient has no stents in place has been controlled by cardiologist on medications. Patient endorses that she does not take aspirin. Repeat left heart cath performed in 2009 that showed 30% occlusion of the LAD with left circumflex normal and 30% occlusion of the RCA and normal left ventricular EF. Patient had myocardial perfusion study in 2016 that showed no hypokinesia or concerning changes per EKG. Also has history of hypertension diabetes hyperlipidemia and obstructive lung disease along with known right bundle-branch block.  Rotator Cuff surgery on August the 8th, in in physical therapy. Patient has no previous history of DVT, PE and takes no estrogen supplementation.  Past Medical & Surgical History    Past Medical History:  Diagnosis Date  . Anxiety   . Bipolar disorder (HCC)   . Chest pain    a. 01/2003 MV w/ ? ant-inflat ischemia-->Cath: LAD 20p, otw nonobs dzs-->Med Rx; b. 2009 Cath: LAD 30, LCX nl/nondominant, RCA 30, nl EF; c. 08/2014 MV: no ischemia/infarct.  . Complication of anesthesia    pt. states she is difficult to wake up  . Depression   . Diabetes mellitus    Type II  . Diabetic neuropathy (HCC)   . Diverticulitis   . GERD  (gastroesophageal reflux disease)   . Headache   . History of bronchitis   . Hypercholesteremia   . Hypertension   . Hypokalemia   . PONV (postoperative nausea and vomiting)   . Shortness of breath dyspnea   . Sleep apnea   . Vertigo    patient reported   Patient Active Problem List   Diagnosis Date Noted  . Poorly controlled type 2 diabetes mellitus with circulatory disorder (HCC) 03/01/2016  . RBBB 03/02/2015  . Obstructive apnea 03/01/2015  . CAD (coronary artery disease) 07/08/2014  . Essential hypertension 07/08/2014  . Hyperlipidemia LDL goal <70 07/08/2014  . OSA on CPAP 07/08/2014  . Chest pain 07/08/2014  . Constipation 07/22/2013  . Yeast vaginitis 07/22/2013   Past Surgical History:  Procedure Laterality Date  . ABDOMINAL HYSTERECTOMY    . APPENDECTOMY    . CARDIAC CATHETERIZATION  2009   normal cath  . CARDIAC CATHETERIZATION  2004   normal cath  . CHOLECYSTECTOMY    . TONSILLECTOMY/ADENOIDECTOMY/TURBINATE REDUCTION Bilateral 03/01/2015   Procedure: TONSILLECTOMY/TURBINATE REDUCTION;  Surgeon: Drema Halonhristopher E Newman, MD;  [facility];  Service: ENT;  Laterality: Bilateral;  . UVULOPALATOPHARYNGOPLASTY Bilateral 03/01/2015   Procedure: UVULOPALATOPHARYNGOPLASTY (UPPP);  Surgeon: Drema Halonhristopher E Newman, MD;  [facility];  Service: ENT;  Laterality: Bilateral;  . WISDOM TOOTH EXTRACTION      Family & Social History    Family History  Problem Relation Age of Onset  . Diabetes Mother   . Gout Mother   .  Hypertension Mother   . Arthritis Mother   . Heart disease Father   . Heart failure Sister   . Arthritis Sister   . Heart disease Maternal Grandmother   . Diabetes Maternal Grandmother   . Heart disease Paternal Grandmother   . Diabetes Paternal Grandmother   . Diabetes Sister   . Bursitis Sister   . Crohn's disease Sister    Social History  Substance Use Topics  . Smoking status: Former Smoker    Quit date: 08/07/1982  . Smokeless tobacco: Never  Used  . Alcohol use Yes     Comment: occasional / social    Home Medications    Prior to Admission medications   Medication Sig Start Date End Date Taking? Authorizing Provider  amLODipine (NORVASC) 5 MG tablet Take 1 tablet (5 mg total) by mouth daily. 04/26/16  Yes Lennette Bihari, MD  celecoxib (CELEBREX) 200 MG capsule Take 200 mg by mouth daily as needed.    Yes Historical Provider, MD  glipiZIDE (GLUCOTROL XL) 10 MG 24 hr tablet Take 1 tablet (10 mg total) by mouth daily with breakfast. 03/29/16  Yes Carlus Pavlov, MD  glipiZIDE (GLUCOTROL) 5 MG tablet Take 1 tablet (5 mg total) by mouth daily with supper. Add 5 mg at dinner daily along with the 10 mg at breakfast. 05/18/16  Yes Carlus Pavlov, MD  hydrochlorothiazide (MICROZIDE) 12.5 MG capsule TAKE ONE CAPSULE BY MOUTH EVERY DAY 03/20/16  Yes Lennette Bihari, MD  Insulin Detemir (LEVEMIR FLEXTOUCH) 100 UNIT/ML Pen Inject 60 Units into the skin daily at 10 pm. Patient taking differently: Inject 70 Units into the skin daily at 10 pm.  04/20/16  Yes Carlus Pavlov, MD  rosuvastatin (CRESTOR) 40 MG tablet Take 1 tablet (40 mg total) by mouth daily. Patient taking differently: Take 40 mg by mouth at bedtime.  09/20/15  Yes Lennette Bihari, MD  valACYclovir (VALTREX) 500 MG tablet Take 500 mg by mouth daily. 05/24/16  Yes Historical Provider, MD  VENTOLIN HFA 108 (90 BASE) MCG/ACT inhaler Inhale 2 puffs into the lungs every 4 (four) hours as needed for wheezing or shortness of breath.  02/12/15  Yes Historical Provider, MD  fluticasone (FLONASE) 50 MCG/ACT nasal spray Place 2 sprays into both nostrils daily. Patient not taking: Reported on 05/25/2016 09/24/15   [name], PA-C    Allergies    Other and Latex  I reviewed & agree with nursing's documentation on the patient's past medical, surgical, social & family histories as well as their allergies.  Review of Systems  Complete ROS obtained, and is negative except as stated in  HPI.  Physical Exam  Updated Vital Signs BP 115/81   Pulse 73   Temp 97.8 F (36.6 C) (Oral)   Resp 18   SpO2 98%  I have reviewed the triage vital signs and the nursing notes. Physical Exam CONST: Patient alert, well appearing, in no apparent distress, oriented to person, place and time.  EYES: PERRLA. EOMI. Conjunctiva w/o d/c. Lids AT w/o swelling.  ENMT: External Nares & Ears AT w/o swelling. Oropharynx patent. MM moist.  NECK: ROM full w/o rigidity. Trachea midline. JVD absent.  CVS: +S1/S2 w/o obvious murmur. Lower extremities w/o pitting edema.  RESP: Respiratory effort unlabored w/o retractions & accessory muscle use. BS clear bilaterally.  GI: Soft & ND. +BS x 4. TTP absent. Hernia absent. Guarding & Rebound absent.  BACK: CVA TTP absent bilaterally.  SKIN: Skin warm & dry. Turgor good.  No rash.  PSYCH: Alert. Oriented. Affect and mood appropriate.  NEURO: CN II-XII grossly intact. Motor exam symmetric w/ upper & lower extremities 5/5 bilaterally. Sensation grossly intact.  MSK: Joints located & stable, w/o obvious dislocation & obvious deformity or crepitus absent w/ Cap refill < 2 sec. Peripheral pulses 2+ & equal in all extremities.   ED Treatments & Results   Labs (only abnormal results are displayed) Labs Reviewed  CBC - Abnormal; Notable for the following:       Result Value   RBC 5.24 (*)    All other components within normal limits  COMPREHENSIVE METABOLIC PANEL - Abnormal; Notable for the following:    Potassium 3.3 (*)    All other components within normal limits  APTT  PROTIME-INR  D-DIMER, QUANTITATIVE (NOT AT [facility])  MAGNESIUM  PHOSPHORUS  MAGNESIUM  TROPONIN I  TROPONIN I  TROPONIN I  I-STAT TROPOININ, ED  CBG MONITORING, ED    EKG    EKG Interpretation  Date/Time:  Thursday May 25 2016 08:15:52 EDT Ventricular Rate:  72 PR Interval:    QRS Duration: 91 QT Interval:  404 QTC Calculation: 443 R Axis:   17 Text Interpretation:   Sinus rhythm Probable anteroseptal infarct, old Baseline wander in lead(s) V4 No significant change since last tracing Confirmed by Anitra Lauth  MD, Alphonzo Lemmings ([id number]) on 05/25/2016 8:33:02 AM       Radiology Dg Chest 2 View  Result Date: 05/25/2016 CLINICAL DATA:  Acute right chest pain radiating to the right arm with hypertension and diabetes. EXAM: CHEST  2 VIEW COMPARISON:  12/26/2013 FINDINGS: Stable mild cardiomegaly and central vascular prominence. No focal pneumonia, collapse or consolidation. Negative for edema, effusion or pneumothorax. Trachea is midline. Remote cholecystectomy noted. Minor degenerative changes of the spine with a slight scoliosis. IMPRESSION: Stable chest exam without superimposed acute process. Mild cardiomegaly. Electronically Signed   By: Judie Petit.  Shick M.D.   On: 05/25/2016 08:49    Pertinent labs & imaging results that were available during my care of the patient were independently visualized by me and considered in my medical decision making, please see chart for details. Formal interpretation provided by Radiology.  Procedures (including critical care time) Procedures  Medications Ordered in ED Medications  nitroGLYCERIN (NITROSTAT) SL tablet 0.4 mg (0.4 mg Sublingual Given 05/25/16 1053)  potassium chloride 10 mEq in 100 mL IVPB (10 mEq Intravenous New Bag/Given 05/25/16 1052)  potassium citrate (UROCIT-K) SR tablet 10 mEq (not administered)  aspirin EC tablet 81 mg (not administered)  nitroGLYCERIN (NITROSTAT) SL tablet 0.4 mg (not administered)  acetaminophen (TYLENOL) tablet 650 mg (not administered)  ondansetron (ZOFRAN) injection 4 mg (not administered)  enoxaparin (LOVENOX) injection 40 mg (not administered)  aspirin chewable tablet 324 mg (324 mg Oral Given 05/25/16 0932)  acetaminophen (TYLENOL) tablet 1,000 mg (1,000 mg Oral Given 05/25/16 1053)    Initial Impression & Plan / ED Course & Results / Final Disposition   Initial Impression & Plan Due  to patient's symptomatic endorsements at this time and past nuchal history of previous known coronary artery disease hypertension, hyperlipidemia and diabetes consideration at this time involves ACS therefore I obtained EKG and upon review appreciate patient has sinus rhythm with 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 Per my review patient's previous EKG on 02/29/2016 patient had T-wave inversion in aVL and  I appreciate no new changes at this time.  Due to patient's onset of symptoms and pleuritic nature of pain that is sharp and stabbing I obtain d-dimer as I believe patient is low risk for pulmonary embolism or DVT to evaluate for these etiologies. I also obtained screening laboratory work and troponin with x-ray 2 view.  Patient has no abdominal pain and I considered cholecystitis perforated abdominal viscus and pancreatitis and do not believe likely.  ED Course & Results Upon my review of labs blood glucose 78, CBC within normal limits without leukocytosis or anemia. Coagulation studies within normal limits. I-STAT troponin undetectable 1. CMP magnesium and phosphorus are unremarkable except potassium mildly low, therefore replaced with IV and PO.  D-dimer is within age-adjusted acceptable limits and therefore I do not believe patient to be at risk for DVT or pulmonary embolism leading the patient's symptomatic endorsements at this time.  I provided the patient with aspirin 324 mg and sublingual nitroglycerin times x3, which per the patient resulted in moderate improvement in pain.  Upon my review patient's chest x-ray 2 view I appreciate patient is no evidence of obvious pneumothorax, rib fracture or focal pulmonary infiltrate concerning for pneumonia. Patient does not have evidence of pulmonary edema and mediastinum within normal limits. Therefore  believe patient is low risk for pneumonia, CHF or aortic dissection.  Final Disposition Reassessment of the patient reveals patient is high risk for discharge at this time in setting of known coronary artery disease and has not had regular follow-up with cardiology nor is she on any coronary artery disease modifying medications such as aspirin. Patient requires admission to hospital for observation and further risk stratification and consideration of further stress testing.  In light of above & the complexity of the patient's medical condition, I believe the patient will require admission for continued medical intervention. ED Course in its entirety was reviewed w/ the patient. I therefore consulted the Internal Medicine Service for admission & we discussed the patient's ED course & they have agreed to admit. They request no further interventions prior to the patient's transport from the ED. Patient stable for transport. Level of Care determined by the Admitting Service.  Final Clinical Impression & ED Diagnoses   1. Chest pain, unspecified type   2. Coronary artery disease involving native heart with angina pectoris, unspecified vessel or lesion type [facility])    Patient care discussed with the attending physician, Dr. Anitra Lauth, who oversaw their evaluation & treatment & voiced agreement.  Note: This document was prepared using Dragon voice recognition software and may include unintentional dictation errors.  House Officer: Jonette Eva, MD, Emergency Medicine Resident.   Jonette Eva, MD 05/25/16 1056    Gwyneth Sprout, MD 05/25/16 2140

## 2016-05-25 NOTE — Telephone Encounter (Signed)
Patient called answering service this morning and I called her back. She stated that she was having left sided chest pain with radiation to under her left breast. Denies SOB and nausea, but did feel diaphoretic. Pain woke her up out of sleep a hour prior and had not lessened in severity. Advised patient to go to the ED for further evaluation. I advised her to call EMS and not drive herself.

## 2016-05-25 NOTE — ED Notes (Signed)
Attempted report 

## 2016-05-26 ENCOUNTER — Encounter (HOSPITAL_COMMUNITY): Payer: Self-pay | Admitting: Cardiology

## 2016-05-26 DIAGNOSIS — I10 Essential (primary) hypertension: Secondary | ICD-10-CM

## 2016-05-26 DIAGNOSIS — Z5181 Encounter for therapeutic drug level monitoring: Secondary | ICD-10-CM | POA: Diagnosis not present

## 2016-05-26 DIAGNOSIS — Z7984 Long term (current) use of oral hypoglycemic drugs: Secondary | ICD-10-CM | POA: Diagnosis not present

## 2016-05-26 DIAGNOSIS — I25119 Atherosclerotic heart disease of native coronary artery with unspecified angina pectoris: Secondary | ICD-10-CM | POA: Diagnosis not present

## 2016-05-26 DIAGNOSIS — G4733 Obstructive sleep apnea (adult) (pediatric): Secondary | ICD-10-CM | POA: Diagnosis not present

## 2016-05-26 DIAGNOSIS — E785 Hyperlipidemia, unspecified: Secondary | ICD-10-CM

## 2016-05-26 DIAGNOSIS — Z79899 Other long term (current) drug therapy: Secondary | ICD-10-CM | POA: Diagnosis not present

## 2016-05-26 DIAGNOSIS — Z955 Presence of coronary angioplasty implant and graft: Secondary | ICD-10-CM | POA: Diagnosis not present

## 2016-05-26 DIAGNOSIS — Z87891 Personal history of nicotine dependence: Secondary | ICD-10-CM | POA: Diagnosis not present

## 2016-05-26 DIAGNOSIS — R071 Chest pain on breathing: Secondary | ICD-10-CM

## 2016-05-26 DIAGNOSIS — Z91040 Latex allergy status: Secondary | ICD-10-CM | POA: Diagnosis not present

## 2016-05-26 DIAGNOSIS — E1140 Type 2 diabetes mellitus with diabetic neuropathy, unspecified: Secondary | ICD-10-CM | POA: Diagnosis not present

## 2016-05-26 DIAGNOSIS — R0789 Other chest pain: Secondary | ICD-10-CM | POA: Diagnosis not present

## 2016-05-26 DIAGNOSIS — E1159 Type 2 diabetes mellitus with other circulatory complications: Secondary | ICD-10-CM | POA: Diagnosis not present

## 2016-05-26 DIAGNOSIS — Z794 Long term (current) use of insulin: Secondary | ICD-10-CM | POA: Diagnosis not present

## 2016-05-26 LAB — BASIC METABOLIC PANEL
Anion gap: 6 (ref 5–15)
BUN: 7 mg/dL (ref 6–20)
CO2: 28 mmol/L (ref 22–32)
Calcium: 8.7 mg/dL — ABNORMAL LOW (ref 8.9–10.3)
Chloride: 105 mmol/L (ref 101–111)
Creatinine, Ser: 0.65 mg/dL (ref 0.44–1.00)
GFR calc Af Amer: >60 mL/min (ref >60)
GFR calc non Af Amer: >60 mL/min (ref >60)
Glucose, Bld: 129 mg/dL — ABNORMAL HIGH (ref 65–99)
Potassium: 3.7 mmol/L (ref 3.5–5.1)
Sodium: 139 mmol/L (ref 135–145)

## 2016-05-26 LAB — GLUCOSE, CAPILLARY
Glucose-Capillary: 96 mg/dL (ref 65–99)
Glucose-Capillary: 75 mg/dL (ref 65–99)

## 2016-05-26 LAB — TROPONIN I: Troponin I: <0.03 ng/mL (ref <0.03)

## 2016-05-26 MED ORDER — POTASSIUM CHLORIDE CRYS ER 20 MEQ PO TBCR
40.0000 meq | EXTENDED_RELEASE_TABLET | Freq: Once | ORAL | Status: AC
  Administered 2016-05-26: 40 meq via ORAL
  Filled 2016-05-26: qty 2

## 2016-05-26 NOTE — Consult Note (Signed)
Reason for Consult: chest pain   Referring Physician: Dr. Dareen Piano   PCP:  Bonnie Garbe, MD  Primary Cardiologist:Dr. Cathrine Muster Bonnie Gordon is an 60 y.o. female.    Chief Complaint: admitted 05/25/16 with chest pain.   HPI: Asked to see 62 yof with a history of nonobstructive CAD, intermittent chest pain, hypertension, hyperlipidemia, and diabetes. Cardiac history dates back to 2004, when she expands chest pressure and had a mildly abnormal Myoview at [facility]. This was followed by catheterization revealing minimal proximal LAD disease. She subsequently underwent repeat catheterization 2009, again showing mild nonobstructive LAD and RCA disease. Her last Myoview was injected with 2016, and was not ischemic.  For Rt sided and mid sternal pain pt had lexiscan myoview 03/2016 EF 45-54% and no ischemia, low risk study. Echo in 08/2014 with EF 55-60%, mild focal basal hypertrophy of the septum.   Now admitted 05/25/16 with chest pain that woke her from sleep, described as dull ache. +radiation down Rt arm to hand and also into Rt jaw.  + diaphoresis no N & V.  Some SOB, in ER 2 SL NTG and ASA with improvement in pain but not resolved.      Also has had Rt shoulder surgery in August. Is having PT.  Intermittent chest pain that has increased over last month. Some sharp shooting pains that are brief.  Other times she has pounding heart beat and she did have this prior to arrival. She does complain of SOB with activity.    Troponin <0.03 X 3, EKG SR with Q waves in III, V1,2 all date back to 02/2016. No acute changes.  Stable CXR NAD.  K+ was 3.3, with K+ replaced and now 3.7.    Currently had chest pain this AM now resolved.  On tele she had ? Vtach at rate of 110.  EKG without acute changes.    Past Medical History:  Diagnosis Date  . Anxiety   . Bipolar disorder ([place])   . Chest pain    a. 01/2003 MV w/ ? ant-inflat ischemia-->Cath: LAD 20p, otw nonobs dzs-->Med Rx; b. 2009  Cath: LAD 30, LCX nl/nondominant, RCA 30, nl EF; c. 08/2014 MV: no ischemia/infarct.  . Complication of anesthesia    pt. states she is difficult to wake up  . Depression   . Diabetes mellitus    Type II  . Diabetic neuropathy ([place])   . Diverticulitis   . GERD (gastroesophageal reflux disease)   . Headache   . History of bronchitis   . Hypercholesteremia   . Hypertension   . Hypokalemia   . PONV (postoperative nausea and vomiting)   . Shortness of breath dyspnea   . Sleep apnea   . Vertigo    patient reported    Past Surgical History:  Procedure Laterality Date  . ABDOMINAL HYSTERECTOMY    . APPENDECTOMY    . CARDIAC CATHETERIZATION  2009   normal cath  . CARDIAC CATHETERIZATION  2004   normal cath  . CHOLECYSTECTOMY    . TONSILLECTOMY/ADENOIDECTOMY/TURBINATE REDUCTION Bilateral 03/01/2015   Procedure: TONSILLECTOMY/TURBINATE REDUCTION;  Surgeon: Rozetta Nunnery, MD;  Location: [place];  Service: ENT;  Laterality: Bilateral;  . UVULOPALATOPHARYNGOPLASTY Bilateral 03/01/2015   Procedure: UVULOPALATOPHARYNGOPLASTY (UPPP);  Surgeon: Rozetta Nunnery, MD;  Location: [place];  Service: ENT;  Laterality: Bilateral;  . WISDOM TOOTH EXTRACTION      Family History  Problem Relation Age of [place]  .  Diabetes Mother   . Gout Mother   . Hypertension Mother   . Arthritis Mother   . Heart disease Father   . Heart failure Sister   . Arthritis Sister   . Heart disease Maternal Grandmother   . Diabetes Maternal Grandmother   . Heart disease Paternal Grandmother   . Diabetes Paternal Grandmother   . Diabetes Sister   . Bursitis Sister   . Crohn's disease Sister    Social History:  reports that she quit smoking about 33 years ago. She has never used smokeless tobacco. She reports that she drinks alcohol. She reports that she does not use drugs.  Allergies:  Allergies  Allergen Reactions  . Other Other (See Comments)    No Blood Products, personal preference  . Latex Itching  and Rash    OUTPATIENT MEDICATIONS: No current facility-administered medications on file prior to encounter.    Current Outpatient Prescriptions on File Prior to Encounter  Medication Sig Dispense Refill  . amLODipine (NORVASC) 5 MG tablet Take 1 tablet (5 mg total) by mouth daily. 90 tablet 3  . celecoxib (CELEBREX) 200 MG capsule Take 200 mg by mouth daily as needed.     Marland Kitchen glipiZIDE (GLUCOTROL XL) 10 MG 24 hr tablet Take 1 tablet (10 mg total) by mouth daily with breakfast. 90 tablet 1  . glipiZIDE (GLUCOTROL) 5 MG tablet Take 1 tablet (5 mg total) by mouth daily with supper. Add 5 mg at dinner daily along with the 10 mg at breakfast. 90 tablet 0  . hydrochlorothiazide (MICROZIDE) 12.5 MG capsule TAKE ONE CAPSULE BY MOUTH EVERY DAY 30 capsule 6  . Insulin Detemir (LEVEMIR FLEXTOUCH) 100 UNIT/ML Pen Inject 60 Units into the skin daily at 10 pm. (Patient taking differently: Inject 70 Units into the skin daily at 10 pm. ) 45 mL 1  . rosuvastatin (CRESTOR) 40 MG tablet Take 1 tablet (40 mg total) by mouth daily. (Patient taking differently: Take 40 mg by mouth at bedtime. ) 90 tablet 3  . VENTOLIN HFA 108 (90 BASE) MCG/ACT inhaler Inhale 2 puffs into the lungs every 4 (four) hours as needed for wheezing or shortness of breath.   0  . fluticasone (FLONASE) 50 MCG/ACT nasal spray Place 2 sprays into both nostrils daily. (Patient not taking: Reported on 05/25/2016) 16 g 0   Current Medications  Scheduled Meds: . aspirin EC  81 mg Oral Daily  . diclofenac sodium  2 g Topical QID  . enoxaparin (LOVENOX) injection  40 mg Subcutaneous Q24H  . insulin aspart  0-15 Units Subcutaneous TID WC  . insulin detemir  30 Units Subcutaneous QHS  . rosuvastatin  40 mg Oral QHS   Continuous Infusions:  PRN Meds:.acetaminophen, morphine injection, nitroGLYCERIN, ondansetron (ZOFRAN) IV   Results for orders placed or performed during the hospital encounter of 05/25/16 (from the past 48 hour(s))  CBC      Status: Abnormal   Collection Time: 05/25/16  8:23 AM  Result Value Ref Range   WBC 6.5 4.0 - 10.5 K/uL   RBC 5.24 (H) 3.87 - 5.11 MIL/uL   Hemoglobin 14.6 12.0 - 15.0 g/dL   HCT 43.4 36.0 - 46.0 %   MCV 82.8 78.0 - 100.0 fL   MCH 27.9 26.0 - 34.0 pg   MCHC 33.6 30.0 - 36.0 g/dL   RDW 13.7 11.5 - 15.5 %   Platelets 171 150 - 400 K/uL  APTT     Status: None   Collection  Time: 05/25/16  8:23 AM  Result Value Ref Range   aPTT 29 24 - 36 seconds  Protime-INR     Status: None   Collection Time: 05/25/16  8:23 AM  Result Value Ref Range   Prothrombin Time 12.8 11.4 - 15.2 seconds   INR 0.96   D-dimer, quantitative     Status: None   Collection Time: 05/25/16  8:23 AM  Result Value Ref Range   D-Dimer, Quant 0.36 0.00 - 0.50 ug/mL-FEU    Comment: (NOTE) At the manufacturer cut-off of 0.50 ug/mL FEU, this assay has been documented to exclude PE with a sensitivity and negative predictive value of 97 to 99%.  At this time, this assay has not been approved by the FDA to exclude DVT/VTE. Results should be correlated with clinical presentation.   Comprehensive metabolic panel     Status: Abnormal   Collection Time: 05/25/16  8:23 AM  Result Value Ref Range   Sodium 141 135 - 145 mmol/L   Potassium 3.3 (L) 3.5 - 5.1 mmol/L   Chloride 105 101 - 111 mmol/L   CO2 27 22 - 32 mmol/L   Glucose, Bld 77 65 - 99 mg/dL   BUN 6 6 - 20 mg/dL   Creatinine, Ser 0.65 0.44 - 1.00 mg/dL   Calcium 9.1 8.9 - 10.3 mg/dL   Total Protein 7.1 6.5 - 8.1 g/dL   Albumin 3.8 3.5 - 5.0 g/dL   AST 31 15 - 41 U/L   ALT 40 14 - 54 U/L   Alkaline Phosphatase 106 38 - 126 U/L   Total Bilirubin 0.3 0.3 - 1.2 mg/dL   GFR calc non Af Amer >60 >60 mL/min   GFR calc Af Amer >60 >60 mL/min    Comment: (NOTE) The eGFR has been calculated using the CKD EPI equation. This calculation has not been validated in all clinical situations. eGFR's persistently <60 mL/min signify possible Chronic Kidney Disease.    Anion  gap 9 5 - 15  Magnesium     Status: None   Collection Time: 05/25/16  8:23 AM  Result Value Ref Range   Magnesium 2.2 1.7 - 2.4 mg/dL  Phosphorus     Status: None   Collection Time: 05/25/16  8:23 AM  Result Value Ref Range   Phosphorus 3.2 2.5 - 4.6 mg/dL  CBG monitoring, ED     Status: None   Collection Time: 05/25/16  8:25 AM  Result Value Ref Range   Glucose-Capillary 78 65 - 99 mg/dL  I-stat troponin, ED     Status: None   Collection Time: 05/25/16  8:34 AM  Result Value Ref Range   Troponin i, poc 0.00 0.00 - 0.08 ng/mL   Comment 3            Comment: Due to the release kinetics of cTnI, a negative result within the first hours of the onset of symptoms does not rule out myocardial infarction with certainty. If myocardial infarction is still suspected, repeat the test at appropriate intervals.   Magnesium     Status: None   Collection Time: 05/25/16 10:34 AM  Result Value Ref Range   Magnesium 2.1 1.7 - 2.4 mg/dL  Troponin I     Status: None   Collection Time: 05/25/16 10:34 AM  Result Value Ref Range   Troponin I <0.03 <0.03 ng/mL  CBG monitoring, ED     Status: None   Collection Time: 05/25/16 12:22 PM  Result Value Ref Range  Glucose-Capillary 65 65 - 99 mg/dL  Troponin I     Status: None   Collection Time: 05/25/16  4:04 PM  Result Value Ref Range   Troponin I <0.03 <0.03 ng/mL  Glucose, capillary     Status: Abnormal   Collection Time: 05/25/16  4:31 PM  Result Value Ref Range   Glucose-Capillary 209 (H) 65 - 99 mg/dL  Glucose, capillary     Status: Abnormal   Collection Time: 05/25/16  9:31 PM  Result Value Ref Range   Glucose-Capillary 222 (H) 65 - 99 mg/dL  Troponin I     Status: None   Collection Time: 05/25/16 10:23 PM  Result Value Ref Range   Troponin I <0.03 <0.03 ng/mL  Basic metabolic panel     Status: Abnormal   Collection Time: 05/26/16  6:08 AM  Result Value Ref Range   Sodium 139 135 - 145 mmol/L   Potassium 3.7 3.5 - 5.1 mmol/L    Chloride 105 101 - 111 mmol/L   CO2 28 22 - 32 mmol/L   Glucose, Bld 129 (H) 65 - 99 mg/dL   BUN 7 6 - 20 mg/dL   Creatinine, Ser 0.65 0.44 - 1.00 mg/dL   Calcium 8.7 (L) 8.9 - 10.3 mg/dL   GFR calc non Af Amer >60 >60 mL/min   GFR calc Af Amer >60 >60 mL/min    Comment: (NOTE) The eGFR has been calculated using the CKD EPI equation. This calculation has not been validated in all clinical situations. eGFR's persistently <60 mL/min signify possible Chronic Kidney Disease.    Anion gap 6 5 - 15  Glucose, capillary     Status: None   Collection Time: 05/26/16  7:46 AM  Result Value Ref Range   Glucose-Capillary 96 65 - 99 mg/dL   Dg Chest 2 View  Result Date: 05/25/2016 CLINICAL DATA:  Acute right chest pain radiating to the right arm with hypertension and diabetes. EXAM: CHEST  2 VIEW COMPARISON:  12/26/2013 FINDINGS: Stable mild cardiomegaly and central vascular prominence. No focal pneumonia, collapse or consolidation. Negative for edema, effusion or pneumothorax. Trachea is midline. Remote cholecystectomy noted. Minor degenerative changes of the spine with a slight scoliosis. IMPRESSION: Stable chest exam without superimposed acute process. Mild cardiomegaly. Electronically Signed   By: Jerilynn Mages.  Shick M.D.   On: 05/25/2016 08:49    ROS: General:no colds or fevers, no weight changes Skin:no rashes or ulcers HEENT:no blurred vision, no congestion CV:see HPI PUL:see HPI GI:no diarrhea constipation or melena, no indigestion GU:no hematuria, no dysuria MS:no joint pain, no claudication Neuro:no syncope, occ lightheadedness Endo:+ diabetes now on insulin, no thyroid disease Psych:  Currently stable.  Blood pressure 109/83, pulse 78, temperature 98.4 F (36.9 C), temperature source Oral, resp. rate 14, weight 185 lb 12.8 oz (84.3 kg), SpO2 95 %.  Wt Readings from Last 3 Encounters:  05/26/16 185 lb 12.8 oz (84.3 kg)  04/20/16 188 lb (85.3 kg)  03/09/16 181 lb (82.1 kg)     GY:KZLDJTT:SVXBLTJQ affect, NAD Skin:Warm and dry, brisk capillary refill HEENT:normocephalic, sclera clear, mucus membranes moist Neck:supple, no JVD, no bruits  Heart:S1S2 RRR with soft 1/6 systolic murmur, no gallup, rub or click Lungs:clear without rales, rhonchi, or wheezes ZES:PQZR, non tender, + BS, do not palpate liver spleen or masses Ext:no lower ext edema, 2+ pedal pulses, 2+ radial pulses Neuro:alert and oriented X 3, MAE, follows commands, + facial symmetry    Assessment/Plan Principal Problem:   Chest pain Active  Problems:   CAD (coronary artery disease)   Essential hypertension   Hyperlipidemia LDL goal <70   Obstructive apnea   RBBB   Poorly controlled type 2 diabetes mellitus with circulatory disorder (HCC)  Chest pain with response to NTG in pt with mild CAD in 2009 and neg. nuc in 03/2016, that is diabetic, HTN and FH CAD.  Dr. Oval Linsey to see, could be muscular skeletal.   Hyperlipidemia on Crestor  LDL in August at 55 TChol 141, HDL 67.  DM-type 2 on home insulin per IM  HTN essential -controlled  OSA-is not using machine currently was to have mouth piece made but believes she will stick to machine. Followed in [place]  Nurse Practitioner Certified [place] Pager [phone] or after 5pm or weekends call [phone] 05/26/2016, 7:47 AM

## 2016-05-26 NOTE — Progress Notes (Signed)
Pt & family member both given discharge instructions & education. All questions were answered. Pt's IV was removed as well as telemetry. CCMD was notified that the pt would be getting d/c'd. Sanda LingerMilam, Perry Molla R, RN

## 2016-05-26 NOTE — Progress Notes (Signed)
   Subjective: No acute events overnight. Patient continues to have some mild right-sided chest pain which is reproducible with palpation. She denies nausea, vomiting or abdominal pain. She denies shortness of breath or diaphoresis. Overall, the patient states that her pain is much improved. She had no additional questions morning.  Objective:  Vital signs in last 24 hours: Vitals:   05/25/16 1500 05/25/16 2100 05/26/16 0500 05/26/16 0838  BP: 123/83 118/65 109/83   Pulse: 73 74 78 69  Resp: 17 (!) 22 14 13   Temp:  98.6 F (37 C) 98.4 F (36.9 C)   TempSrc:  Oral Oral   SpO2:  91% 95% 98%  Weight:   185 lb 12.8 oz (84.3 kg)    Physical Exam  Constitutional: She is oriented to person, place, and time. She appears well-developed and well-nourished.  Resting comfortably in bed in no acute distress.  HENT:  Head: Normocephalic and atraumatic.  Cardiovascular: Normal rate and regular rhythm.  Exam reveals no gallop and no friction rub.   No murmur heard. There is reproducible pain upon palpation over the patient's right chest. Additionally, this pain was improved with Voltaren gel  Respiratory: Effort normal and breath sounds normal. No respiratory distress. She has no wheezes.  GI: Soft. Bowel sounds are normal. She exhibits no distension. There is no tenderness.  Musculoskeletal: She exhibits no edema.  Neurological: She is alert and oriented to person, place, and time.     Assessment/Plan: In summary, the patient's chest pain seems musculoskeletal in etiology. She had 2 EKGs without evidence of acute ST segment elevation. Her troponins were negative 3. She was evaluated by cardiology who also thinks her pain is most likely musculoskeletal in etiology. Her pain is reproducible with palpation and has improved with topical Voltaren gel. At this time I do not think further ischemic workup is necessary for the patient's atypical right-sided chest pain.  1. Chest pain Most likely  etiology is musculoskeletal in nature. For additional details please see cardiology's note. Troponins were negative, EKG without ST segment elevation. Pain is reproducible on palpation. Pain is improved with Voltaren gel and rest. At this time the patient chest pain does not seem to be ischemic in nature. She is medically stable and appropriate for discharge. We will discharge her this morning. She will follow-up with her cardiologist as needed.  2. HLD- LDL 55 on 02/29/16 - continue crestor 40mg   3. Hypertension- normotensive, hold home norvasc 5mg , and HCTZ 12.5mg   4. OSA-- CPAP  5. DM2--follows with endocrinology. Last A1c was 9.5 in September 2017. On glipizide 5 mg at night and 10 mg with breakfast, Levemir 60 units daily. - Levemir 30 units daily -Sliding scale     Dispo: Anticipated discharge this morning.   Thomasene LotJames Maggy Wyble, MD 05/26/2016, 11:02 AM Pager: [phone]

## 2016-05-26 NOTE — Discharge Summary (Signed)
Name: Bonnie Gordon MRN: [id number] DOB: 1955-10-26 60 y.o. PCP: Leilani AbleBetti Reese, MD  Date of Admission: 05/25/2016  8:09 AM Date of Discharge: 05/26/2016 Attending Physician: Earl LagosNischal Narendra, MD  Discharge Diagnosis: 1. Atypical chest pain   Discharge Medications:   Medication List    TAKE these medications   amLODipine 5 MG tablet Commonly known as:  NORVASC Take 1 tablet (5 mg total) by mouth daily.   celecoxib 200 MG capsule Commonly known as:  CELEBREX Take 200 mg by mouth daily as needed.   fluticasone 50 MCG/ACT nasal spray Commonly known as:  FLONASE Place 2 sprays into both nostrils daily.   glipiZIDE 10 MG 24 hr tablet Commonly known as:  GLUCOTROL XL Take 1 tablet (10 mg total) by mouth daily with breakfast.   glipiZIDE 5 MG tablet Commonly known as:  GLUCOTROL Take 1 tablet (5 mg total) by mouth daily with supper. Add 5 mg at dinner daily along with the 10 mg at breakfast.   hydrochlorothiazide 12.5 MG capsule Commonly known as:  MICROZIDE TAKE ONE CAPSULE BY MOUTH EVERY DAY   Insulin Detemir 100 UNIT/ML Pen Commonly known as:  LEVEMIR FLEXTOUCH Inject 60 Units into the skin daily at 10 pm. What changed:  how much to take   rosuvastatin 40 MG tablet Commonly known as:  CRESTOR Take 1 tablet (40 mg total) by mouth daily. What changed:  when to take this   valACYclovir 500 MG tablet Commonly known as:  VALTREX Take 500 mg by mouth daily.   VENTOLIN HFA 108 (90 Base) MCG/ACT inhaler Generic drug:  albuterol Inhale 2 puffs into the lungs every 4 (four) hours as needed for wheezing or shortness of breath.       Disposition and follow-up:   Bonnie Gordon [facility] Good [facility].  At the hospital follow up visit please address:  1.  Follow-up with cardiology as needed.  2.  Labs / imaging needed at time of follow-up: None  3.  Pending labs/ test needing follow-up: None  Follow-up Appointments:  As  needed  Hospital Course by problem list:   1. Atypical chest pain The patient presented to [facility] department on 05/25/2016 with chest pain that woke her up from sleep. She described this pain as a dull ache with radiation down her right arm to her hand and right jaw. This pain was associated with diaphoresis but no nausea or vomiting. In the emergency department the patient was given 2 sublingual nitroglycerin and aspirin with relief of her pain. She had an EKG which did not show any acute ST segment elevation. In the emergency department labs were unremarkable with exception of potassium of 3.3. A d-dimer was ordered which was within normal limits for age adjustment. She was then admitted to the [facility] internal medicine teaching service for ACS rule out.  Overnight, the patient's chest pain improved. This pain was relieved with topical Voltaren gel. Additionally, this pain was reproducible upon palpation of the right sternal border and seems most consistent with musculoskeletal etiologies. She follows with cardiology and recently had a stress test in August of this year that was negative for ischemia. She was evaluated by cardiology while inpatient who did not think the patient's chest pain was ischemic in nature. The exact etiology of the patient's chest pain was not determined however the most likely etiology is muscular skeletal in nature. On the day of discharge the patient was afebrile and  hemodynamically stable. Her chest pain had largely improved. She denied nausea, vomiting or shortness of breath. She will have follow-up as needed.  Discharge Vitals:   BP 109/83 (BP Location: Left Arm)   Pulse 69   Temp 98.4 F (36.9 C) (Oral)   Resp 13   Wt 185 lb 12.8 oz (84.3 kg)   SpO2 98%   BMI 29.99 kg/m   Pertinent Labs, Studies, and Procedures:  1. Troponins negative 3 2. EKG on admission without acute ST segment elevation 3. EKG on day of discharge without acute ST  segment elevation 4. Chest x-ray-mild cardiomegaly, no acute cardio pulmonary abnormality  Discharge Instructions: Discharge Instructions    Diet - low sodium heart healthy    Complete by:  As directed    Discharge instructions    Complete by:  As directed    Please continue to take your medications as you have been taking them before you came to the hospital. Please follow-up with youtr cardiologist as needed.   Increase activity slowly    Complete by:  As directed       Signed: Thomasene Lot, MD 05/26/2016, 11:22 AM   Pager: [phone]

## 2016-05-26 NOTE — Care Management Obs Status (Signed)
MEDICARE OBSERVATION STATUS NOTIFICATION   Patient Details  Name: Bonnie Gordon MRN: [name] Date of Birth: Sep 14, 1955   Medicare Observation Status Notification Given:  Yes    Gala LewandowskyGraves-Bigelow, [name] Kaye, RN 05/26/2016, 11:02 AM

## 2016-05-31 DIAGNOSIS — Z9889 Other specified postprocedural states: Secondary | ICD-10-CM | POA: Diagnosis not present

## 2016-05-31 DIAGNOSIS — F458 Other somatoform disorders: Secondary | ICD-10-CM | POA: Diagnosis not present

## 2016-05-31 DIAGNOSIS — K219 Gastro-esophageal reflux disease without esophagitis: Secondary | ICD-10-CM | POA: Diagnosis not present

## 2016-05-31 DIAGNOSIS — R1310 Dysphagia, unspecified: Secondary | ICD-10-CM | POA: Diagnosis not present

## 2016-06-01 DIAGNOSIS — M25511 Pain in right shoulder: Secondary | ICD-10-CM | POA: Diagnosis not present

## 2016-06-01 DIAGNOSIS — M25611 Stiffness of right shoulder, not elsewhere classified: Secondary | ICD-10-CM | POA: Diagnosis not present

## 2016-06-01 DIAGNOSIS — M75111 Incomplete rotator cuff tear or rupture of right shoulder, not specified as traumatic: Secondary | ICD-10-CM | POA: Diagnosis not present

## 2016-06-05 DIAGNOSIS — M75111 Incomplete rotator cuff tear or rupture of right shoulder, not specified as traumatic: Secondary | ICD-10-CM | POA: Diagnosis not present

## 2016-06-05 DIAGNOSIS — M25511 Pain in right shoulder: Secondary | ICD-10-CM | POA: Diagnosis not present

## 2016-06-05 DIAGNOSIS — M25611 Stiffness of right shoulder, not elsewhere classified: Secondary | ICD-10-CM | POA: Diagnosis not present

## 2016-06-07 ENCOUNTER — Other Ambulatory Visit: Payer: Self-pay

## 2016-06-07 ENCOUNTER — Telehealth: Payer: Self-pay | Admitting: Internal Medicine

## 2016-06-07 DIAGNOSIS — M25611 Stiffness of right shoulder, not elsewhere classified: Secondary | ICD-10-CM | POA: Diagnosis not present

## 2016-06-07 DIAGNOSIS — M25511 Pain in right shoulder: Secondary | ICD-10-CM | POA: Diagnosis not present

## 2016-06-07 DIAGNOSIS — M75111 Incomplete rotator cuff tear or rupture of right shoulder, not specified as traumatic: Secondary | ICD-10-CM | POA: Diagnosis not present

## 2016-06-07 MED ORDER — GLUCOSE BLOOD VI STRP: ORAL_STRIP | 3 refills | Status: DC

## 2016-06-07 NOTE — Telephone Encounter (Signed)
Patient need refill of test strips send to    [id number] [place], [place] - [address] [facility] [address] [address] [phone] (Phone) [phone] (Fax)

## 2016-06-08 ENCOUNTER — Other Ambulatory Visit: Payer: Self-pay

## 2016-06-08 MED ORDER — GLUCOSE BLOOD VI STRP: ORAL_STRIP | 3 refills | Status: DC

## 2016-06-08 NOTE — Telephone Encounter (Signed)
[facility] stated [facility] said Dr Elvera LennoxGherghe must fill out a form in order for patient get her medication. Patient  Haven't been able to test in a week ana half. Please advise

## 2016-06-12 DIAGNOSIS — M75111 Incomplete rotator cuff tear or rupture of right shoulder, not specified as traumatic: Secondary | ICD-10-CM | POA: Diagnosis not present

## 2016-06-12 DIAGNOSIS — M25611 Stiffness of right shoulder, not elsewhere classified: Secondary | ICD-10-CM | POA: Diagnosis not present

## 2016-06-12 DIAGNOSIS — M25511 Pain in right shoulder: Secondary | ICD-10-CM | POA: Diagnosis not present

## 2016-06-13 DIAGNOSIS — G4719 Other hypersomnia: Secondary | ICD-10-CM | POA: Diagnosis not present

## 2016-06-13 DIAGNOSIS — R0683 Snoring: Secondary | ICD-10-CM | POA: Diagnosis not present

## 2016-06-13 DIAGNOSIS — G4733 Obstructive sleep apnea (adult) (pediatric): Secondary | ICD-10-CM | POA: Diagnosis not present

## 2016-06-14 DIAGNOSIS — M25511 Pain in right shoulder: Secondary | ICD-10-CM | POA: Diagnosis not present

## 2016-06-14 DIAGNOSIS — M75111 Incomplete rotator cuff tear or rupture of right shoulder, not specified as traumatic: Secondary | ICD-10-CM | POA: Diagnosis not present

## 2016-06-14 DIAGNOSIS — M25611 Stiffness of right shoulder, not elsewhere classified: Secondary | ICD-10-CM | POA: Diagnosis not present

## 2016-06-19 DIAGNOSIS — M25511 Pain in right shoulder: Secondary | ICD-10-CM | POA: Diagnosis not present

## 2016-06-19 DIAGNOSIS — M75111 Incomplete rotator cuff tear or rupture of right shoulder, not specified as traumatic: Secondary | ICD-10-CM | POA: Diagnosis not present

## 2016-06-19 DIAGNOSIS — M25611 Stiffness of right shoulder, not elsewhere classified: Secondary | ICD-10-CM | POA: Diagnosis not present

## 2016-06-21 DIAGNOSIS — M75111 Incomplete rotator cuff tear or rupture of right shoulder, not specified as traumatic: Secondary | ICD-10-CM | POA: Diagnosis not present

## 2016-06-21 DIAGNOSIS — M25511 Pain in right shoulder: Secondary | ICD-10-CM | POA: Diagnosis not present

## 2016-06-21 DIAGNOSIS — M25611 Stiffness of right shoulder, not elsewhere classified: Secondary | ICD-10-CM | POA: Diagnosis not present

## 2016-06-26 DIAGNOSIS — F458 Other somatoform disorders: Secondary | ICD-10-CM | POA: Diagnosis not present

## 2016-06-26 DIAGNOSIS — M75111 Incomplete rotator cuff tear or rupture of right shoulder, not specified as traumatic: Secondary | ICD-10-CM | POA: Diagnosis not present

## 2016-06-26 DIAGNOSIS — M25611 Stiffness of right shoulder, not elsewhere classified: Secondary | ICD-10-CM | POA: Diagnosis not present

## 2016-06-26 DIAGNOSIS — M25511 Pain in right shoulder: Secondary | ICD-10-CM | POA: Diagnosis not present

## 2016-06-26 DIAGNOSIS — K219 Gastro-esophageal reflux disease without esophagitis: Secondary | ICD-10-CM | POA: Diagnosis not present

## 2016-06-26 DIAGNOSIS — R1310 Dysphagia, unspecified: Secondary | ICD-10-CM | POA: Diagnosis not present

## 2016-07-03 DIAGNOSIS — M75111 Incomplete rotator cuff tear or rupture of right shoulder, not specified as traumatic: Secondary | ICD-10-CM | POA: Diagnosis not present

## 2016-07-03 DIAGNOSIS — M25511 Pain in right shoulder: Secondary | ICD-10-CM | POA: Diagnosis not present

## 2016-07-03 DIAGNOSIS — M25611 Stiffness of right shoulder, not elsewhere classified: Secondary | ICD-10-CM | POA: Diagnosis not present

## 2016-07-10 DIAGNOSIS — Z09 Encounter for follow-up examination after completed treatment for conditions other than malignant neoplasm: Secondary | ICD-10-CM | POA: Diagnosis not present

## 2016-07-11 DIAGNOSIS — I10 Essential (primary) hypertension: Secondary | ICD-10-CM | POA: Diagnosis not present

## 2016-07-11 DIAGNOSIS — E119 Type 2 diabetes mellitus without complications: Secondary | ICD-10-CM | POA: Diagnosis not present

## 2016-07-11 DIAGNOSIS — E118 Type 2 diabetes mellitus with unspecified complications: Secondary | ICD-10-CM | POA: Diagnosis not present

## 2016-07-11 DIAGNOSIS — E876 Hypokalemia: Secondary | ICD-10-CM | POA: Diagnosis not present

## 2016-07-20 ENCOUNTER — Encounter: Payer: Self-pay | Admitting: Internal Medicine

## 2016-07-20 ENCOUNTER — Ambulatory Visit (INDEPENDENT_AMBULATORY_CARE_PROVIDER_SITE_OTHER): Payer: Medicare Other | Admitting: Internal Medicine

## 2016-07-20 VITALS — BP 114/76 | HR 86 | Ht 66.0 in | Wt 185.0 lb

## 2016-07-20 DIAGNOSIS — E1165 Type 2 diabetes mellitus with hyperglycemia: Secondary | ICD-10-CM | POA: Diagnosis not present

## 2016-07-20 DIAGNOSIS — I2510 Atherosclerotic heart disease of native coronary artery without angina pectoris: Secondary | ICD-10-CM

## 2016-07-20 DIAGNOSIS — I2583 Coronary atherosclerosis due to lipid rich plaque: Secondary | ICD-10-CM | POA: Diagnosis not present

## 2016-07-20 DIAGNOSIS — E1159 Type 2 diabetes mellitus with other circulatory complications: Secondary | ICD-10-CM | POA: Diagnosis not present

## 2016-07-20 LAB — POCT GLYCOSYLATED HEMOGLOBIN (HGB A1C): Hemoglobin A1C: 7.5

## 2016-07-20 MED ORDER — LIRAGLUTIDE 18 MG/3ML ~~LOC~~ SOPN: 1.8000 mg | PEN_INJECTOR | Freq: Every day | SUBCUTANEOUS | 2 refills | Status: DC

## 2016-07-20 MED ORDER — INSULIN DETEMIR 100 UNIT/ML FLEXPEN: 60.0000 [IU] | PEN_INJECTOR | Freq: Every day | SUBCUTANEOUS | 1 refills | Status: DC

## 2016-07-20 NOTE — Progress Notes (Signed)
Patient ID: Bonnie Gordon, female   DOB: 08-10-1955, 60 y.o.   MRN: [id number]   HPI: Bonnie Gordon is a 60 y.o.-year-old female, returning for follow-up for DM2, dx in 201285-, insulin-dependent, uncontrolled, with complications (CAD, PN). Last visit 6 weeks ago.  Sugars increased since last visit >> we increased Lantus and added Glipizide before dinner.  She was admitted with CP 05/2016 (found to have hypokalemia, mm cramp)  Last hemoglobin A1c was: Lab Results  Component Value Date   HGBA1C 9.5 04/20/2016   HGBA1C 8.1 (H) 08/30/2015   HGBA1C 7.1 (H) 02/25/2015  01/14/2016: HbA1c 11.3%  Pt is on a regimen of: - Levemir 70 units once a day at bedtime. - started Victoza (started by PCP last week) 0.6 mg day - Glipizide ER 10 mg in am - Glipizide 5 mg daily before dinner  Prev., Per records from PCP (patient does not remember the names of the medicines): - Glimepiride 4 mg before b'fast/ Glipizide XL 10 mg in am  - Tradjenta 5 mg in am  These were stopped when she started insulin.  She was on Metformin >> diarrhea  Pt checks her sugars 2x a day: - am: 85-109 >> 120-148 >> 75-136 - 2h after b'fast: n/c >> 127 - before lunch: n/c >> 130s >> 100, 120, 153  - 2h after lunch: n/c - before dinner: 120s >> 130s >> 98, 205, 273 - 2h after dinner: n/c - bedtime: n/c - nighttime: n/c No lows. Lowest sugar was 80s >> 97 >> 75; ? hypoglycemia awareness. Highest sugar was 277 >> 309 (sweet tea) >> 273.  Glucometer: AccuChek [employer]iva  Pt's meals are: - Breakfast:Eggs, bacon, ham, oatmeal, pancakes, grits - Lunch: Burger - Dinner: Meat - Snacks: 1  - no CKD, last BUN/creatinine:  01/13/2016: 12/0.83, glucose 472 Lab Results  Component Value Date   BUN 7 05/26/2016   BUN 6 05/25/2016   CREATININE 0.65 05/26/2016   CREATININE 0.65 05/25/2016   - last set of lipids: 01/13/2016: 147/218/73/69 Lab Results  Component Value Date   CHOL 141 02/29/2016   HDL 67 02/29/2016   LDLCALC 55  02/29/2016   TRIG 94 02/29/2016   CHOLHDL 2.1 02/29/2016  On Crestor 40. - last eye exam was in 12/2015. No DR. + small cataract. - + numbness and tingling in her feet.  She sees podiatry.  Last TSH: 01/13/2016: TSH 1.39 Lab Results  Component Value Date   TSH 0.835 08/30/2015   ROS: Constitutional: + weight gain, no fatigue, no hot flushes, no poor sleep Eyes + blurry vision, no xerophthalmia ENT: no sore throat, no nodules palpated in throat, + dysphagia/no odynophagia, + hoarseness Cardiovascular: no CP/no palpitations/leg swelling Respiratory: no cough/+ SOB Gastrointestinal: no N/V/D/C/+ acid reflux Musculoskeletal: no muscle/joint aches Skin: no rashes Neurological: no tremors/numbness/tingling/dizziness, + headaches  I reviewed pt's medications, allergies, PMH, social hx, family hx, and changes were documented in the history of present illness. Otherwise, unchanged from my initial visit note.  Past Medical History:  Diagnosis Date  . Anxiety   . Bipolar disorder (HCC)   . Chest pain    a. 01/2003 MV w/ ? ant-inflat ischemia-->Cath: LAD 20p, otw nonobs dzs-->Med Rx; b. 2009 Cath: LAD 30, LCX nl/nondominant, RCA 30, nl EF; c. 08/2014 MV: no ischemia/infarct.  . Complication of anesthesia    pt. states she is difficult to wake up  . Depression   . Diabetes mellitus    Type II  . Diabetic neuropathy (HCC)   .  Diverticulitis   . GERD (gastroesophageal reflux disease)   . Headache   . History of bronchitis   . Hypercholesteremia   . Hypertension   . Hypokalemia   . PONV (postoperative nausea and vomiting)   . Shortness of breath dyspnea   . Sleep apnea   . Vertigo    patient reported   Past Surgical History:  Procedure Laterality Date  . ABDOMINAL HYSTERECTOMY    . APPENDECTOMY    . CARDIAC CATHETERIZATION  2009   normal cath  . CARDIAC CATHETERIZATION  2004   normal cath  . CHOLECYSTECTOMY    . TONSILLECTOMY/ADENOIDECTOMY/TURBINATE REDUCTION Bilateral  03/01/2015   Procedure: TONSILLECTOMY/TURBINATE REDUCTION;  Surgeon: Drema Halonhristopher E Newman, MD;  [facility];  Service: ENT;  Laterality: Bilateral;  . UVULOPALATOPHARYNGOPLASTY Bilateral 03/01/2015   Procedure: UVULOPALATOPHARYNGOPLASTY (UPPP);  Surgeon: Drema Halonhristopher E Newman, MD;  [facility];  Service: ENT;  Laterality: Bilateral;  . WISDOM TOOTH EXTRACTION     Social History   Social History  . Marital status: Divorced    Spouse name: N/A  . Number of children: 2   Occupational History  . Disability    Social History Main Topics  . Smoking status: Former Smoker    Quit date: 08/07/1982  . Smokeless tobacco: Never Used  . Alcohol use Yes     Comment: Drinks during the weekends 1 beer or 1 drink of liquor per day   . Drug use: No  . Sexual activity: Yes    Partners: Male    Birth control/ protection: Surgical   Current Outpatient Prescriptions on File Prior to Visit  Medication Sig Dispense Refill  . amLODipine (NORVASC) 5 MG tablet Take 1 tablet (5 mg total) by mouth daily. 90 tablet 3  . celecoxib (CELEBREX) 200 MG capsule Take 200 mg by mouth daily as needed.     . fluticasone (FLONASE) 50 MCG/ACT nasal spray Place 2 sprays into both nostrils daily. (Patient not taking: Reported on 05/25/2016) 16 g 0  . glipiZIDE (GLUCOTROL XL) 10 MG 24 hr tablet Take 1 tablet (10 mg total) by mouth daily with breakfast. 90 tablet 1  . glipiZIDE (GLUCOTROL) 5 MG tablet Take 1 tablet (5 mg total) by mouth daily with supper. Add 5 mg at dinner daily along with the 10 mg at breakfast. 90 tablet 0  . glucose blood (ACCU-CHEK AVIVA) test strip Use as instructed to check sugars 2 times daily. Dx-E11.65 200 each 3  . hydrochlorothiazide (MICROZIDE) 12.5 MG capsule TAKE ONE CAPSULE BY MOUTH EVERY DAY 30 capsule 6  . Insulin Detemir (LEVEMIR FLEXTOUCH) 100 UNIT/ML Pen Inject 60 Units into the skin daily at 10 pm. (Patient taking differently: Inject 70 Units into the skin daily at 10 pm. ) 45 mL 1   . rosuvastatin (CRESTOR) 40 MG tablet Take 1 tablet (40 mg total) by mouth daily. (Patient taking differently: Take 40 mg by mouth at bedtime. ) 90 tablet 3  . valACYclovir (VALTREX) 500 MG tablet Take 500 mg by mouth daily.    . VENTOLIN HFA 108 (90 BASE) MCG/ACT inhaler Inhale 2 puffs into the lungs every 4 (four) hours as needed for wheezing or shortness of breath.   0   No current facility-administered medications on file prior to visit.    Allergies  Allergen Reactions  . Other Other (See Comments)    No Blood Products, personal preference  . Latex Itching and Rash   Family History  Problem Relation Age of Onset  .  Diabetes Mother   . Gout Mother   . Hypertension Mother   . Arthritis Mother   . Heart disease Father   . Heart failure Sister   . Arthritis Sister   . Heart disease Maternal Grandmother   . Diabetes Maternal Grandmother   . Heart disease Paternal Grandmother   . Diabetes Paternal Grandmother   . Diabetes Sister   . Bursitis Sister   . Crohn's disease Sister    PE: BP 114/76 (BP Location: Left Arm, Patient Position: Sitting, Cuff Size: Normal)   Pulse 86   Ht 5\' 6"  (1.676 m)   Wt 185 lb (83.9 kg)   SpO2 97%   BMI 29.86 kg/m  Wt Readings from Last 3 Encounters:  07/20/16 185 lb (83.9 kg)  05/26/16 185 lb 12.8 oz (84.3 kg)  04/20/16 188 lb (85.3 kg)   Constitutional: overweight, in NAD Eyes: PERRLA, EOMI, no exophthalmos ENT: moist mucous membranes, no thyromegaly, no cervical lymphadenopathy Cardiovascular: RRR, No MRG Respiratory: CTA B Gastrointestinal: abdomen soft, NT, ND, BS+ Musculoskeletal: no deformities, strength intact in all 4 Skin: moist, warm, no rashes Neurological: no tremor with outstretched hands, DTR normal in all 4  ASSESSMENT: 1. DM2, insulin-dependent, uncontrolled, with complications - CAD - PN  PLAN:  1. Patient with long-standing, uncontrolled diabetes, previously on oral antidiabetic regimen + basal insulin. Sugars  worsened since last visit 2/2 eating more sweets >> now started to eliminate them >> sugars improved.  PCP added Victoza (samples) >> will try to send this to her pharmacy to see if covered. Will increase the dose as she is tolerating the low dose well. Will also decrease Levemir since she had mild low CBGs in am. - I suggested to:  Patient Instructions  Please continue: - Glipizide ER 10 mg before breakfast. - Glipizide 5 mg before dinner  Please decrease Levemir to 60 units.  Please increase Victoza to 1.2 mg daily. In 5 more days, you can increase to 1.8 mg daily.  Please return in 3 months with your sugar log.   - continue to check sugars at different times of the day - check 2 times a day, rotating checks - advised for yearly eye exams >> she is UTD - had flu shot this season - checked HbA1c today >> 7.5% (much better) - Return to clinic in 3 mo with sugar log   Carlus Pavlov, MD PhD [facility] Endocrinology

## 2016-07-20 NOTE — Patient Instructions (Signed)
Please continue: - Glipizide ER 10 mg before breakfast. - Glipizide 5 mg before dinner  Please decrease Levemir to 60 units.  Please increase Victoza to 1.2 mg daily. In 5 more days, you can increase to 1.8 mg daily.  Please return in 3 months with your sugar log.

## 2016-07-24 DIAGNOSIS — G4710 Hypersomnia, unspecified: Secondary | ICD-10-CM | POA: Diagnosis not present

## 2016-07-25 DIAGNOSIS — G4733 Obstructive sleep apnea (adult) (pediatric): Secondary | ICD-10-CM | POA: Diagnosis not present

## 2016-08-01 ENCOUNTER — Telehealth: Payer: Self-pay | Admitting: Internal Medicine

## 2016-08-01 NOTE — Telephone Encounter (Signed)
Patient notified of MD's message and voiced understanding.

## 2016-08-01 NOTE — Telephone Encounter (Signed)
Pt is nauseated after eating, she is hungry and can't eat due to the nausea. Could this be because of the meds she's on?  The BS have been in AM runs between 87-91 fast, before bed 107 is the average.

## 2016-08-01 NOTE — Telephone Encounter (Signed)
Please ask her to stop the Victoza for the next 3 days and restart at 0.6 mg daily. Continue this for 5 days and then try again to increase to 1.2 mg daily. Stay on this dose afterwards.

## 2016-08-15 DIAGNOSIS — G4733 Obstructive sleep apnea (adult) (pediatric): Secondary | ICD-10-CM | POA: Diagnosis not present

## 2016-08-15 DIAGNOSIS — G4719 Other hypersomnia: Secondary | ICD-10-CM | POA: Diagnosis not present

## 2016-08-15 DIAGNOSIS — R5383 Other fatigue: Secondary | ICD-10-CM | POA: Diagnosis not present

## 2016-08-16 ENCOUNTER — Other Ambulatory Visit: Payer: Self-pay | Admitting: Internal Medicine

## 2016-09-02 ENCOUNTER — Other Ambulatory Visit: Payer: Self-pay | Admitting: Internal Medicine

## 2016-09-04 DIAGNOSIS — Z09 Encounter for follow-up examination after completed treatment for conditions other than malignant neoplasm: Secondary | ICD-10-CM | POA: Diagnosis not present

## 2016-09-24 ENCOUNTER — Other Ambulatory Visit: Payer: Self-pay | Admitting: Internal Medicine

## 2016-09-24 ENCOUNTER — Other Ambulatory Visit: Payer: Self-pay | Admitting: Cardiovascular Disease

## 2016-09-25 ENCOUNTER — Other Ambulatory Visit: Payer: Self-pay

## 2016-09-25 MED ORDER — LIRAGLUTIDE 18 MG/3ML ~~LOC~~ SOPN: 1.8000 mg | PEN_INJECTOR | Freq: Every day | SUBCUTANEOUS | 2 refills | Status: DC

## 2016-09-28 ENCOUNTER — Encounter: Payer: Self-pay | Admitting: Cardiovascular Disease

## 2016-09-28 ENCOUNTER — Ambulatory Visit (INDEPENDENT_AMBULATORY_CARE_PROVIDER_SITE_OTHER): Payer: Medicare Other | Admitting: Cardiovascular Disease

## 2016-09-28 VITALS — BP 134/83 | HR 81 | Ht 66.0 in | Wt 182.0 lb

## 2016-09-28 DIAGNOSIS — E785 Hyperlipidemia, unspecified: Secondary | ICD-10-CM | POA: Diagnosis not present

## 2016-09-28 DIAGNOSIS — Z79899 Other long term (current) drug therapy: Secondary | ICD-10-CM

## 2016-09-28 DIAGNOSIS — G4733 Obstructive sleep apnea (adult) (pediatric): Secondary | ICD-10-CM | POA: Diagnosis not present

## 2016-09-28 DIAGNOSIS — E119 Type 2 diabetes mellitus without complications: Secondary | ICD-10-CM

## 2016-09-28 DIAGNOSIS — I10 Essential (primary) hypertension: Secondary | ICD-10-CM | POA: Diagnosis not present

## 2016-09-28 DIAGNOSIS — I2583 Coronary atherosclerosis due to lipid rich plaque: Secondary | ICD-10-CM | POA: Diagnosis not present

## 2016-09-28 DIAGNOSIS — Z794 Long term (current) use of insulin: Secondary | ICD-10-CM | POA: Diagnosis not present

## 2016-09-28 DIAGNOSIS — I2510 Atherosclerotic heart disease of native coronary artery without angina pectoris: Secondary | ICD-10-CM | POA: Diagnosis not present

## 2016-09-28 MED ORDER — METOPROLOL SUCCINATE ER 25 MG PO TB24: 25.0000 mg | ORAL_TABLET | Freq: Every day | ORAL | 6 refills | Status: DC

## 2016-09-28 NOTE — Patient Instructions (Signed)
Your physician has recommended you make the following change in your medication:   1.) start new prescription sent into your pharmacy for metoprolol succ 25 mg.  Your physician wants you to follow-up in: 6 months or sooner if needed. You will receive a reminder letter in the mail two months in advance. If you don't receive a letter, please call our office to schedule the follow-up appointment.

## 2016-09-28 NOTE — Progress Notes (Signed)
Patient ID: Bonnie Gordon, female   DOB: [birth year], 61 y.o.   MRN: [id number]   Primary MD:  Dr. Arnoldo Morale   HPI: Bonnie Gordon is a 61 y.o. female who presents for a 13 month  follow-up evaluation.   I had seen Ms. Nori Gordon initially in 2004 after she had experienced chest pressure and was evaluated at [facility] emergency room.  A nuclear perfusion study raised the possibility of anterior to inferolateral wall ischemia.  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  She was treated aggressively with statin therapy in attempt to induce plaque or aggression.  She had a second heart catheterization which was done by Dr. Gwenlyn Found in 2009 which showed 30% LAD and RCA stenoses with a normal nondominant circumflex and with continued normal LV function.  She developed diabetes mellitus over the last 4 years.  She is on medication for hypertension as well as GERD, hyperlipidemia, asthma and peripheral neuropathy.  She also was diagnosed with sleep apnea and is using CPAP therapy.  When I saw her one year ago she had developed episodes of chest discomfort.  Oftentimes these are sharp and short-lived.   A 2-D echo Doppler study on 08/12/2014 showed normal systolic function without regional wall motion abnormalities or abnormal valvular architecture.  A nuclear perfusion study revealed an ejection fraction of 58% with normal perfusion and function.  There were no ECG changes on the study.  She underwent laboratory which demonstrated a hemoglobin of 13.9, hematocrit 41.3.  Electrolytes were normal, although glucose was mildly elevated at 1:15.  She had normal liver function studies with the exception of minimal alkaline phosphatase elevation at 136.  Her lipids were excellent with a total cholesterol 151, triglycerides 97, HDL  52, LDL 80.  Hemoglobin A1c was increased at 7.0.  She presents now for follow-up evaluation.  When I last saw her in January 2017, she had stopped taking Crestor. She was inactive and not exercising.  He had noted some mild leg swelling.  She was taking amlodipine for hypertension.  At that time she was on CPAP therapy.  Since I last saw her, she states that she is not using CPAP.  She needs a humidifier.  She has experienced episodes of atypical chest pain described as a shooting pain on the right side of her chest.  Oftentimes her pain occurs while she is sitting and is nonexertional.  She underwent a nuclear perfusion study in August 2017, which was low risk and showed normal perfusion.  EF was 49% without wall motion abnormalities.  She tells me she underwent rotator cuff surgery.  Recently, she has noticed her heart rate typically to be elevated in the 100 bpm range.  She presents for evaluation.  Past Medical History:  Diagnosis Date  . Anxiety   . Bipolar disorder ([place])   . Chest pain    a. 01/2003 MV w/ ? ant-inflat ischemia-->Cath: LAD 20p, otw nonobs dzs-->Med Rx; b. 2009 Cath: LAD 30, LCX nl/nondominant, RCA 30, nl EF; c. 08/2014 MV: no ischemia/infarct.  . Complication of anesthesia    pt. states she is difficult to wake up  . Depression   . Diabetes mellitus    Type II  . Diabetic neuropathy ([place])   . Diverticulitis   . GERD (gastroesophageal reflux disease)   . Headache   . History of  bronchitis   . Hypercholesteremia   . Hypertension   . Hypokalemia   . PONV (postoperative nausea and vomiting)   . Shortness of breath dyspnea   . Sleep apnea   . Vertigo    patient reported    Past Surgical History:  Procedure Laterality Date  . ABDOMINAL HYSTERECTOMY    . APPENDECTOMY    . CARDIAC CATHETERIZATION  2009   normal cath  . CARDIAC CATHETERIZATION  2004   normal cath  . CHOLECYSTECTOMY    . TONSILLECTOMY/ADENOIDECTOMY/TURBINATE REDUCTION Bilateral 03/01/2015    Procedure: TONSILLECTOMY/TURBINATE REDUCTION;  Surgeon: Rozetta Nunnery, MD;  Location: [facility];  Service: ENT;  Laterality: Bilateral;  . UVULOPALATOPHARYNGOPLASTY Bilateral 03/01/2015   Procedure: UVULOPALATOPHARYNGOPLASTY (UPPP);  Surgeon: Rozetta Nunnery, MD;  Location: [facility];  Service: ENT;  Laterality: Bilateral;  . WISDOM TOOTH EXTRACTION      Allergies  Allergen Reactions  . Other Other (See Comments)    No Blood Products, personal preference  . Latex Itching and Rash    Current Outpatient Prescriptions  Medication Sig Dispense Refill  . amLODipine (NORVASC) 5 MG tablet Take 1 tablet (5 mg total) by mouth daily. 90 tablet 3  . aspirin EC 81 MG tablet Take 81 mg by mouth daily.    . celecoxib (CELEBREX) 200 MG capsule Take 200 mg by mouth daily as needed.     . fluticasone (FLONASE) 50 MCG/ACT nasal spray Place 2 sprays into both nostrils daily. 16 g 0  . glipiZIDE (GLUCOTROL XL) 10 MG 24 hr tablet TAKE 1 TABLET BY MOUTH DAILY WITH BREAKFAST 90 tablet 1  . glipiZIDE (GLUCOTROL) 5 MG tablet TAKE 1 TAB BY MOUTH EVERY DAY WITH SUPPER IN ADDITION TO THE 10MG THAT YOU ARE TAKING IN THE MORNING 90 tablet 0  . glucose blood (ACCU-CHEK AVIVA) test strip Use as instructed to check sugars 2 times daily. Dx-E11.65 200 each 3  . Insulin Detemir (LEVEMIR FLEXTOUCH) 100 UNIT/ML Pen Inject 60 Units into the skin daily at 10 pm. 45 mL 1  . LEVEMIR FLEXTOUCH 100 UNIT/ML Pen INJECT 60 UNITS INTO THE SKIN EVERY DAY AT 10 PM 45 pen 1  . liraglutide 18 MG/3ML SOPN Inject 1.2 mg into the skin daily.    Marland Kitchen omeprazole (PRILOSEC) 20 MG capsule Take 20 mg by mouth daily.    . rosuvastatin (CRESTOR) 40 MG tablet TAKE 1 TABLET BY MOUTH EVERY DAY 90 tablet 3  . spironolactone (ALDACTONE) 25 MG tablet Take 25 mg by mouth daily.    . valACYclovir (VALTREX) 500 MG tablet Take 500 mg by mouth daily.    . VENTOLIN HFA 108 (90 BASE) MCG/ACT inhaler Inhale 2 puffs into the lungs every 4 (four) hours as needed  for wheezing or shortness of breath.   0  . metoprolol succinate (TOPROL XL) 25 MG 24 hr tablet Take 1 tablet (25 mg total) by mouth daily. 30 tablet 6   No current facility-administered medications for this visit.     Socially she is divorced.  She quit tobacco use in 1986.  She does drink occasional alcohol.  She does not routinely exercise.  Family History  Problem Relation Age of Onset  . Diabetes Mother   . Gout Mother   . Hypertension Mother   . Arthritis Mother   . Heart disease Father   . Heart failure Sister   . Arthritis Sister   . Heart disease Maternal Grandmother   . Diabetes Maternal Grandmother   .  Heart disease Paternal Grandmother   . Diabetes Paternal Grandmother   . Diabetes Sister   . Bursitis Sister   . Crohn's disease Sister    Family history is notable that her mother is alive at 81 and has diabetes mellitus, gout, and hypertension.  Father died at age 66 with heart failure and had a pacemaker.  One brother also has a pacemaker.  One sister has congestive heart failure.  Another sister has diabetes mellitus.  Another sister has Crohn's disease.  ROS General: Negative; No fevers, chills, or night sweats HEENT: Negative; No changes in vision or hearing, sinus congestion, difficulty swallowing Pulmonary: Negative; No cough, wheezing, shortness of breath, hemoptysis Cardiovascular:  See HPI; No anginal chest pain, presyncope, syncope, palpitations, edema GI: Positive for GERD on omeprazole; No nausea, vomiting, diarrhea, or abdominal pain GU: Negative; No dysuria, hematuria, or difficulty voiding Musculoskeletal: Negative; no myalgias, joint pain, or weakness Hematologic/Oncologic: Negative; no easy bruising, bleeding Endocrine: Negative; no heat/cold intolerance; no diabetes Neuro: Positive for peripheral neuropathy on gabapentin Skin: Negative; No rashes or skin lesions Psychiatric: Negative; No behavioral problems, depression Sleep: Positive for  obstructive sleep apnea not using CPAP therapy with mild daytime sleepiness, hypersomnolence; no bruxism, restless legs, hypnogagnic hallucinations Other comprehensive 14 point system review is negative   Physical Exam BP 134/83 (BP Location: Right Arm)   Pulse 81   Ht 5' 6" (1.676 m)   Wt 182 lb (82.6 kg)   BMI 29.38 kg/m     Repeat blood pressure by me 122/78  Wt Readings from Last 3 Encounters:  09/28/16 182 lb (82.6 kg)  07/20/16 185 lb (83.9 kg)  05/26/16 185 lb 12.8 oz (84.3 kg)   General: Alert, oriented, no distress.  Skin: normal turgor, no rashes, warm and dry HEENT: Normocephalic, atraumatic. Pupils equal round and reactive to light; sclera anicteric; extraocular muscles intact; Fundi no hemorrhages or exudates.  Sharp discs. Nose without nasal septal hypertrophy Mouth/Parynx benign; Mallinpatti scale 3 Neck: No JVD, no carotid bruits; normal carotid upstroke Lungs: clear to ausculatation and percussion; no wheezing or rales Chest wall: Definite tenderness to palpation over the left costochondral region. Heart: PMI not displaced, RRR, s1 s2 normal, 1/6 systolic murmur, no diastolic murmur, no rubs, gallops, thrills, or heaves Abdomen: soft, nontender; no hepatosplenomehaly, BS+; abdominal aorta nontender and not dilated by palpation. Back: no CVA tenderness Pulses 2+ Musculoskeletal: full range of motion, normal strength, no joint deformities Extremities:  Trace edema, left greater than right.no clubbing cyanosis , Homan's sign negative  Neurologic: grossly nonfocal; Cranial nerves grossly wnl Psychologic: Normal mood and affect  ECG (independently read by me): Normal sinus rhythm at 81 bpm.  QRS complex V1 and V2.  Normal intervals.  January 2017 ECG (independently read by me):  Normal sinus rhythm with a PAC.  QTc interval 422  January 2016 ECG (independently read by me): Normal sinus rhythm at 77 bpm.  QRS complex V1 V2; nondiagnostic T changes anterolaterally.   QTc interval 454 ms  LABS:  BMP Latest Ref Rng & Units 05/26/2016 05/25/2016 01/15/2016  Glucose 65 - 99 mg/dL 129(H) 77 389(H)  BUN 6 - 20 mg/dL _0 Creatinine 0.44 - 1.00 mg/dL 0.65 0.65 1.00  Sodium 135 - 145 mmol/L 139 141 135  Potassium 3.5 - 5.1 mmol/L 3.7 3.3(L) 3.7  Chloride 101 - 111 mmol/L 105 105 97(L)  CO2 22 - 32 mmol/L _1 Calcium 8.9 - 10.3 mg/dL 8.7(L) 9.1 9.4  Hepatic Function Latest Ref Rng & Units 05/25/2016 02/29/2016 09/19/2015  Total Protein 6.5 - 8.1 g/dL 7.1 7.2 7.5  Albumin 3.5 - 5.0 g/dL 3.8 4.0 3.6  AST 15 - 41 U/L _0 ALT 14 - 54 U/L 40 20 20  Alk Phosphatase 38 - 126 U/L 106 99 131(H)  Total Bilirubin 0.3 - 1.2 mg/dL 0.3 0.4 0.7  Bilirubin, Direct <=0.2 mg/dL - 0.1 -   CBC Latest Ref Rng & Units 05/25/2016 01/15/2016 09/19/2015  WBC 4.0 - 10.5 K/uL 6.5 5.3 9.8  Hemoglobin 12.0 - 15.0 g/dL 14.6 13.6 13.3  Hematocrit 36.0 - 46.0 % 43.4 40.4 39.6  Platelets 150 - 400 K/uL 171 185 189   Lab Results  Component Value Date   MCV 82.8 05/25/2016   MCV 81.5 01/15/2016   MCV 82.8 09/19/2015   Lab Results  Component Value Date   TSH 0.835 08/30/2015   Lab Results  Component Value Date   HGBA1C 7.5 07/20/2016   Lipid Panel     Component Value Date/Time   CHOL 141 02/29/2016 0856   TRIG 94 02/29/2016 0856   HDL 67 02/29/2016 0856   CHOLHDL 2.1 02/29/2016 0856   VLDL 19 02/29/2016 0856   LDLCALC 55 02/29/2016 0856    RADIOLOGY: No results found.  IMPRESSION: 1. Coronary artery disease due to lipid rich plaque   2. Essential hypertension   3. Hyperlipidemia LDL goal <70   4. Medication management   5. Type 2 diabetes mellitus without complication, with long-term current use of insulin ([place])   6. OSA (obstructive sleep apnea)     ASSESSMENT AND PLAN: Ms. Margit Hanks is a 61 year old African-American female who has previously been documented have mild CAD at initial catheterization in 2004 and subsequent cardiac catheterization  in 2009.  She has developed diabetes mellitus, hypertension, obstructive sleep apnea and peripheral neuropathy.  Presently, she has been on amlodipine 5 mg and spironolactone 25 mg daily for blood pressure control.  In the past she had been on losartan therapy which was discontinued.  He has noticed a sensation of increased heart rate.  I am adding low-dose beta blocker with Toprol XL 25 mg, which should control her episodes.  She is diabetic and is on insulin.  In the past, we discussed benefit with ARB therapy.  She stopped using [place] CPAP therapy.  She will be purchasing a humidifier for CPAP unit and plans to reinitiate therapy.  She continues to be on Crestor now at 40 mg for hyperlipidemia.  Laboratory in July 2017 showed an LDL cholesterol at 55, which is at target in this diabetic female with mild CAD.  I have encouraged increased exercise and suggested at least 5 days per week for minimum of 30 minutes.  Her sharp, fleeting chest pain is not ischemic in etiology and most likely is musculoskeletal.  I will see her in 6 months for reevaluation.  Time spent: 25 minutes Troy Sine, MD, [facility] 09/30/2016 2:38 PM

## 2016-10-18 ENCOUNTER — Encounter: Payer: Self-pay | Admitting: Internal Medicine

## 2016-10-18 ENCOUNTER — Ambulatory Visit (INDEPENDENT_AMBULATORY_CARE_PROVIDER_SITE_OTHER): Payer: Medicare HMO | Admitting: Internal Medicine

## 2016-10-18 VITALS — BP 120/82 | HR 89 | Ht 67.0 in | Wt 181.0 lb

## 2016-10-18 DIAGNOSIS — E1159 Type 2 diabetes mellitus with other circulatory complications: Secondary | ICD-10-CM

## 2016-10-18 DIAGNOSIS — I2510 Atherosclerotic heart disease of native coronary artery without angina pectoris: Secondary | ICD-10-CM | POA: Diagnosis not present

## 2016-10-18 DIAGNOSIS — E1165 Type 2 diabetes mellitus with hyperglycemia: Secondary | ICD-10-CM

## 2016-10-18 DIAGNOSIS — I2583 Coronary atherosclerosis due to lipid rich plaque: Secondary | ICD-10-CM | POA: Diagnosis not present

## 2016-10-18 LAB — POCT GLYCOSYLATED HEMOGLOBIN (HGB A1C): Hemoglobin A1C: 6.2

## 2016-10-18 MED ORDER — LIRAGLUTIDE 18 MG/3ML ~~LOC~~ SOPN: 1.2000 mg | PEN_INJECTOR | Freq: Every day | SUBCUTANEOUS | 3 refills | Status: DC

## 2016-10-18 MED ORDER — INSULIN DETEMIR 100 UNIT/ML FLEXPEN: PEN_INJECTOR | SUBCUTANEOUS | 1 refills | Status: DC

## 2016-10-18 MED ORDER — GLIPIZIDE ER 5 MG PO TB24: 5.0000 mg | ORAL_TABLET | Freq: Every day | ORAL | 3 refills | Status: DC

## 2016-10-18 NOTE — Progress Notes (Signed)
Patient ID: Bonnie ShieldsDaryll Gordon, female   DOB: February 14, 1956, 61 y.o.   MRN: [id number]   HPI: Bonnie Gordon is a 61 y.o.-year-old female, returning for follow-up for DM2, dx in 201285-, insulin-dependent, uncontrolled, with complications (CAD, PN). Last visit 3 mo ago.  Last hemoglobin A1c was: Lab Results  Component Value Date   HGBA1C 7.5 07/20/2016   HGBA1C 9.5 04/20/2016   HGBA1C 8.1 (H) 08/30/2015  01/14/2016: HbA1c 11.3%  Pt is on a regimen of: - Glipizide ER 10 mg before breakfast. - Glipizide 5 mg before dinner - Levemir 60 units at bedtime - Victoza 1.2 mg daily in am Prev., Per records from PCP (patient does not remember the names of the medicines): - Glimepiride 4 mg before b'fast/ Glipizide XL 10 mg in am  - Tradjenta 5 mg in am  These were stopped when she started insulin.  She was on Metformin >> diarrhea  Pt checks her sugars 2x a day: - am: 85-109 >> 120-148 >> 75-136 >> 86-118, 123 - 2h after b'fast: n/c >> 127 >> n/c - before lunch: n/c >> 130s >> 100, 120, 153 >> n/c - 2h after lunch: n/c - before dinner: 120s >> 130s >> 98, 205, 273 >> n/c - 2h after dinner: n/c - bedtime: n/c - nighttime: n/c No lows. Lowest sugar was 80s >> 97 >> 75 >> 86; ? hypoglycemia awareness. Highest sugar was 277 >> 309 (sweet tea) >> 273 >> 123.  Glucometer: AccuChek Aviva  Eats fried fish once a week, no red meat.  She started to go to the gym: walks 1/2 mile and does 50 min of aerobics (class). She will start water aerobics.  - no CKD, last BUN/creatinine:  Lab Results  Component Value Date   BUN 7 05/26/2016   BUN 6 05/25/2016   CREATININE 0.65 05/26/2016   CREATININE 0.65 05/25/2016  01/13/2016: 12/0.83, glucose 472  - last set of lipids: Lab Results  Component Value Date   CHOL 141 02/29/2016   HDL 67 02/29/2016   LDLCALC 55 02/29/2016   TRIG 94 02/29/2016   CHOLHDL 2.1 02/29/2016  01/13/2016: 147/218/73/69 On Crestor 40. - last eye exam was in 12/2015. No DR. + small  cataract. - + numbness and tingling in her feet.  She sees podiatry.  Last TSH: 01/13/2016: TSH 1.39 Lab Results  Component Value Date   TSH 0.835 08/30/2015   She was admitted with CP 05/2016 (found to have hypokalemia, mm cramp)  ROS: Constitutional: + weight loss, no fatigue, no hot flushes, no poor sleep Eyes + blurry vision, no xerophthalmia ENT: + sore throat (+ nasal prong CPAP), no nodules palpated in throat, no dysphagia/no odynophagia, no hoarseness Cardiovascular: + CP/no palpitations/leg swelling Respiratory: + cough/no SOB Gastrointestinal: no N/V/D/C/+ acid reflux Musculoskeletal: no muscle/joint aches Skin: no rashes Neurological: no tremors/numbness/tingling/dizziness, + headaches  I reviewed pt's medications, allergies, PMH, social hx, family hx, and changes were documented in the history of present illness. Otherwise, unchanged from my initial visit note.  Past Medical History:  Diagnosis Date  . Anxiety   . Bipolar disorder (HCC)   . Chest pain    a. 01/2003 MV w/ ? ant-inflat ischemia-->Cath: LAD 20p, otw nonobs dzs-->Med Rx; b. 2009 Cath: LAD 30, LCX nl/nondominant, RCA 30, nl EF; c. 08/2014 MV: no ischemia/infarct.  . Complication of anesthesia    pt. states she is difficult to wake up  . Depression   . Diabetes mellitus    Type II  .  Diabetic neuropathy (HCC)   . Diverticulitis   . GERD (gastroesophageal reflux disease)   . Headache   . History of bronchitis   . Hypercholesteremia   . Hypertension   . Hypokalemia   . PONV (postoperative nausea and vomiting)   . Shortness of breath dyspnea   . Sleep apnea   . Vertigo    patient reported   Past Surgical History:  Procedure Laterality Date  . ABDOMINAL HYSTERECTOMY    . APPENDECTOMY    . CARDIAC CATHETERIZATION  2009   normal cath  . CARDIAC CATHETERIZATION  2004   normal cath  . CHOLECYSTECTOMY    . TONSILLECTOMY/ADENOIDECTOMY/TURBINATE REDUCTION Bilateral 03/01/2015   Procedure:  TONSILLECTOMY/TURBINATE REDUCTION;  Surgeon: Drema Halon, MD;  Location: [facility] OR;  Service: ENT;  Laterality: Bilateral;  . UVULOPALATOPHARYNGOPLASTY Bilateral 03/01/2015   Procedure: UVULOPALATOPHARYNGOPLASTY (UPPP);  Surgeon: Drema Halon, MD;  Location: [facility] OR;  Service: ENT;  Laterality: Bilateral;  . WISDOM TOOTH EXTRACTION     Social History   Social History  . Marital status: Divorced    Spouse name: N/A  . Number of children: 2   Occupational History  . Disability    Social History Main Topics  . Smoking status: Former Smoker    Quit date: 08/07/1982  . Smokeless tobacco: Never Used  . Alcohol use Yes     Comment: Drinks during the weekends 1 beer or 1 drink of liquor per day   . Drug use: No  . Sexual activity: Yes    Partners: Male    Birth control/ protection: Surgical   Current Outpatient Prescriptions on File Prior to Visit  Medication Sig Dispense Refill  . amLODipine (NORVASC) 5 MG tablet Take 1 tablet (5 mg total) by mouth daily. 90 tablet 3  . aspirin EC 81 MG tablet Take 81 mg by mouth daily.    . celecoxib (CELEBREX) 200 MG capsule Take 200 mg by mouth daily as needed.     . fluticasone (FLONASE) 50 MCG/ACT nasal spray Place 2 sprays into both nostrils daily. 16 g 0  . glipiZIDE (GLUCOTROL XL) 10 MG 24 hr tablet TAKE 1 TABLET BY MOUTH DAILY WITH BREAKFAST 90 tablet 1  . glipiZIDE (GLUCOTROL) 5 MG tablet TAKE 1 TAB BY MOUTH EVERY DAY WITH SUPPER IN ADDITION TO THE 10MG  THAT YOU ARE TAKING IN THE MORNING 90 tablet 0  . glucose blood (ACCU-CHEK AVIVA) test strip Use as instructed to check sugars 2 times daily. Dx-E11.65 200 each 3  . Insulin Detemir (LEVEMIR FLEXTOUCH) 100 UNIT/ML Pen Inject 60 Units into the skin daily at 10 pm. 45 mL 1  . LEVEMIR FLEXTOUCH 100 UNIT/ML Pen INJECT 60 UNITS INTO THE SKIN EVERY DAY AT 10 PM 45 pen 1  . liraglutide 18 MG/3ML SOPN Inject 1.2 mg into the skin daily.    . metoprolol succinate (TOPROL XL) 25 MG 24 hr tablet  Take 1 tablet (25 mg total) by mouth daily. 30 tablet 6  . omeprazole (PRILOSEC) 20 MG capsule Take 20 mg by mouth daily.    . rosuvastatin (CRESTOR) 40 MG tablet TAKE 1 TABLET BY MOUTH EVERY DAY 90 tablet 3  . spironolactone (ALDACTONE) 25 MG tablet Take 25 mg by mouth daily.    . valACYclovir (VALTREX) 500 MG tablet Take 500 mg by mouth daily.    . VENTOLIN HFA 108 (90 BASE) MCG/ACT inhaler Inhale 2 puffs into the lungs every 4 (four) hours as needed for wheezing or  shortness of breath.   0   No current facility-administered medications on file prior to visit.    Allergies  Allergen Reactions  . Other Other (See Comments)    No Blood Products, personal preference  . Latex Itching and Rash   Family History  Problem Relation Age of Onset  . Diabetes Mother   . Gout Mother   . Hypertension Mother   . Arthritis Mother   . Heart disease Father   . Heart failure Sister   . Arthritis Sister   . Heart disease Maternal Grandmother   . Diabetes Maternal Grandmother   . Heart disease Paternal Grandmother   . Diabetes Paternal Grandmother   . Diabetes Sister   . Bursitis Sister   . Crohn's disease Sister    PE: BP 120/82 (BP Location: Left Arm, Patient Position: Sitting)   Pulse 89   Ht 5\' 7"  (1.702 m)   Wt 181 lb (82.1 kg)   SpO2 97%   BMI 28.35 kg/m  Wt Readings from Last 3 Encounters:  10/18/16 181 lb (82.1 kg)  09/28/16 182 lb (82.6 kg)  07/20/16 185 lb (83.9 kg)   Constitutional: overweight, in NAD Eyes: PERRLA, EOMI, no exophthalmos ENT: moist mucous membranes, no thyromegaly, no cervical lymphadenopathy Cardiovascular: RRR, No MRG Respiratory: CTA B Gastrointestinal: abdomen soft, NT, ND, BS+ Musculoskeletal: no deformities, strength intact in all 4 Skin: moist, warm, no rashes Neurological: no tremor with outstretched hands, DTR normal in all 4  ASSESSMENT: 1. DM2, insulin-dependent, uncontrolled, with complications - CAD - PN  PLAN:  1. Patient with  long-standing, uncontrolled diabetes, previously on oral antidiabetic regimen + basal insulin + Victoza. Sugars are much better at this visit as she improved her diet and started exercise. She only checks sugars in am and they are great, but advised her to also check later. Will start decreasing her Glipizide and insulin. - I suggested to:  Patient Instructions  Please continue: - Glipizide 5 mg before dinner - Victoza 1.2 mg daily in am  Please decrease: - Glipizide ER to 5 mg before b'fast - Levemir to 45 units at bedtime  Please return in 3 months with your sugar log.   - continue to check sugars at different times of the day - check 2 times a day, rotating checks >> advised to also check sugars later in the day - advised for yearly eye exams >> she is UTD - had flu shot this season - checked HbA1c today >> 6.2% (much better) - Return to clinic in 3 mo with sugar log   Carlus Pavlov, MD PhD [facility] Endocrinology

## 2016-10-18 NOTE — Patient Instructions (Addendum)
Please continue: - Glipizide 5 mg before dinner - Victoza 1.2 mg daily in am  Please decrease: - Glipizide ER to 5 mg before b'fast - Levemir to 45 units at bedtime  Please return in 3 months with your sugar log.

## 2016-10-18 NOTE — Addendum Note (Signed)
Addended by: Darene LamerHOMPSON, Haydee Jabbour T on: 10/18/2016 09:35 AM   Modules accepted: Orders

## 2016-10-23 ENCOUNTER — Emergency Department (HOSPITAL_COMMUNITY)
Admission: EM | Admit: 2016-10-23 | Discharge: 2016-10-23 | Disposition: A | Discharge status: Home / Self Care | Payer: Medicare HMO | Attending: Emergency Medicine | Admitting: Emergency Medicine

## 2016-10-23 DIAGNOSIS — R112 Nausea with vomiting, unspecified: Secondary | ICD-10-CM | POA: Insufficient documentation

## 2016-10-23 DIAGNOSIS — Z794 Long term (current) use of insulin: Secondary | ICD-10-CM | POA: Diagnosis not present

## 2016-10-23 DIAGNOSIS — E1140 Type 2 diabetes mellitus with diabetic neuropathy, unspecified: Secondary | ICD-10-CM | POA: Diagnosis not present

## 2016-10-23 DIAGNOSIS — Z79899 Other long term (current) drug therapy: Secondary | ICD-10-CM | POA: Diagnosis not present

## 2016-10-23 DIAGNOSIS — Z955 Presence of coronary angioplasty implant and graft: Secondary | ICD-10-CM | POA: Diagnosis not present

## 2016-10-23 DIAGNOSIS — Z87891 Personal history of nicotine dependence: Secondary | ICD-10-CM | POA: Diagnosis not present

## 2016-10-23 DIAGNOSIS — Z7982 Long term (current) use of aspirin: Secondary | ICD-10-CM | POA: Insufficient documentation

## 2016-10-23 DIAGNOSIS — Z5321 Procedure and treatment not carried out due to patient leaving prior to being seen by health care provider: Secondary | ICD-10-CM | POA: Diagnosis not present

## 2016-10-23 LAB — COMPREHENSIVE METABOLIC PANEL
ALT: 22 U/L (ref 14–54)
AST: 17 U/L (ref 15–41)
Albumin: 4.5 g/dL (ref 3.5–5.0)
Alkaline Phosphatase: 102 U/L (ref 38–126)
Anion gap: 7 (ref 5–15)
BUN: 15 mg/dL (ref 6–20)
CO2: 30 mmol/L (ref 22–32)
Calcium: 10 mg/dL (ref 8.9–10.3)
Chloride: 105 mmol/L (ref 101–111)
Creatinine, Ser: 0.81 mg/dL (ref 0.44–1.00)
GFR calc Af Amer: >60 mL/min (ref >60)
GFR calc non Af Amer: >60 mL/min (ref >60)
Glucose, Bld: 117 mg/dL — ABNORMAL HIGH (ref 65–99)
Potassium: 4.6 mmol/L (ref 3.5–5.1)
Sodium: 142 mmol/L (ref 135–145)
Total Bilirubin: 0.6 mg/dL (ref 0.3–1.2)
Total Protein: 8.7 g/dL — ABNORMAL HIGH (ref 6.5–8.1)

## 2016-10-23 LAB — CBC
HCT: 43.4 % (ref 36.0–46.0)
Hemoglobin: 14.7 g/dL (ref 12.0–15.0)
MCH: 28.1 pg (ref 26.0–34.0)
MCHC: 33.9 g/dL (ref 30.0–36.0)
MCV: 83 fL (ref 78.0–100.0)
Platelets: 224 10*3/uL (ref 150–400)
RBC: 5.23 MIL/uL — ABNORMAL HIGH (ref 3.87–5.11)
RDW: 13.7 % (ref 11.5–15.5)
WBC: 7.3 10*3/uL (ref 4.0–10.5)

## 2016-10-23 LAB — LIPASE, BLOOD: Lipase: 36 U/L (ref 11–51)

## 2016-10-23 MED ORDER — ONDANSETRON 4 MG PO TBDP
4.0000 mg | ORAL_TABLET | Freq: Once | ORAL | Status: AC | PRN
Start: 2016-10-23 — End: 2016-10-23
  Administered 2016-10-23: 4 mg via ORAL
  Filled 2016-10-23: qty 1

## 2016-10-23 NOTE — ED Notes (Signed)
Pt stated, "she was just going home, she feels better since she was given something for nausea."

## 2016-10-23 NOTE — ED Triage Notes (Signed)
Patient c/o nausea and vomiting x2 days. Denies abdominal pain, diarrhea, chest pain and SOB. Ambulatory to triage.

## 2016-10-24 ENCOUNTER — Other Ambulatory Visit: Payer: Self-pay

## 2016-10-24 DIAGNOSIS — G4733 Obstructive sleep apnea (adult) (pediatric): Secondary | ICD-10-CM | POA: Diagnosis not present

## 2016-10-24 MED ORDER — INSULIN PEN NEEDLE 32G X 4 MM MISC: 5 refills | Status: DC

## 2016-10-25 DIAGNOSIS — G4719 Other hypersomnia: Secondary | ICD-10-CM | POA: Diagnosis not present

## 2016-10-25 DIAGNOSIS — G4733 Obstructive sleep apnea (adult) (pediatric): Secondary | ICD-10-CM | POA: Diagnosis not present

## 2016-10-25 DIAGNOSIS — Z9989 Dependence on other enabling machines and devices: Secondary | ICD-10-CM | POA: Diagnosis not present

## 2016-10-25 DIAGNOSIS — R5383 Other fatigue: Secondary | ICD-10-CM | POA: Diagnosis not present

## 2016-11-06 ENCOUNTER — Telehealth: Payer: Self-pay | Admitting: Internal Medicine

## 2016-11-06 NOTE — Telephone Encounter (Signed)
Pt stays nauseated after eating for a few hours. Is there anything we can do to help her

## 2016-11-06 NOTE — Telephone Encounter (Signed)
Please advise. Thank you

## 2016-11-06 NOTE — Telephone Encounter (Signed)
I hope her sugars are not very low when this happens... Please ask her to decrease the Victoza to 0.6 mg for few days and see if this improves the nausea.

## 2016-11-07 ENCOUNTER — Telehealth: Payer: Self-pay

## 2016-11-07 NOTE — Telephone Encounter (Signed)
Called and advised patient of Dr.Gherghe's note. Patient understood and will decrease the medication and call back in a couple of days. She does not have any low sugars during the nausea, sugars are staying in the 83 and 93 range. Patient will continue to monitor.

## 2016-11-07 NOTE — Telephone Encounter (Signed)
Called and advised patient of Dr.Gherghe's note. Patient understood and will decrease the medication and call back in a couple of days. She does not have any low sugars during the nausea, sugars are staying in the 83 and 93 range. Patient will continue to monitor.  

## 2016-11-12 ENCOUNTER — Other Ambulatory Visit: Payer: Self-pay | Admitting: Internal Medicine

## 2016-11-15 ENCOUNTER — Other Ambulatory Visit (HOSPITAL_BASED_OUTPATIENT_CLINIC_OR_DEPARTMENT_OTHER): Payer: Self-pay | Admitting: Family Medicine

## 2016-11-15 DIAGNOSIS — Z1231 Encounter for screening mammogram for malignant neoplasm of breast: Secondary | ICD-10-CM

## 2016-11-17 DIAGNOSIS — Z9989 Dependence on other enabling machines and devices: Secondary | ICD-10-CM | POA: Diagnosis not present

## 2016-11-17 DIAGNOSIS — G4733 Obstructive sleep apnea (adult) (pediatric): Secondary | ICD-10-CM | POA: Diagnosis not present

## 2016-11-23 ENCOUNTER — Encounter (HOSPITAL_BASED_OUTPATIENT_CLINIC_OR_DEPARTMENT_OTHER): Payer: Self-pay

## 2016-11-23 ENCOUNTER — Ambulatory Visit (HOSPITAL_BASED_OUTPATIENT_CLINIC_OR_DEPARTMENT_OTHER)
Admission: RE | Admit: 2016-11-23 | Discharge: 2016-11-23 | Disposition: A | Discharge status: Home / Self Care | Payer: Medicare HMO | Source: Ambulatory Visit | Attending: Family Medicine | Admitting: Family Medicine

## 2016-11-23 DIAGNOSIS — Z1231 Encounter for screening mammogram for malignant neoplasm of breast: Secondary | ICD-10-CM | POA: Diagnosis not present

## 2016-12-18 DIAGNOSIS — E119 Type 2 diabetes mellitus without complications: Secondary | ICD-10-CM | POA: Diagnosis not present

## 2016-12-18 DIAGNOSIS — I10 Essential (primary) hypertension: Secondary | ICD-10-CM | POA: Diagnosis not present

## 2017-01-03 IMAGING — DX DG CHEST 2V
2 series · 2 of 2 positions shown · non-contrast
Comparison: 12/26/2013

CLINICAL DATA: Acute right chest pain radiating to the right arm
with hypertension and diabetes.

EXAM:
CHEST  2 VIEW

[w chest pa]
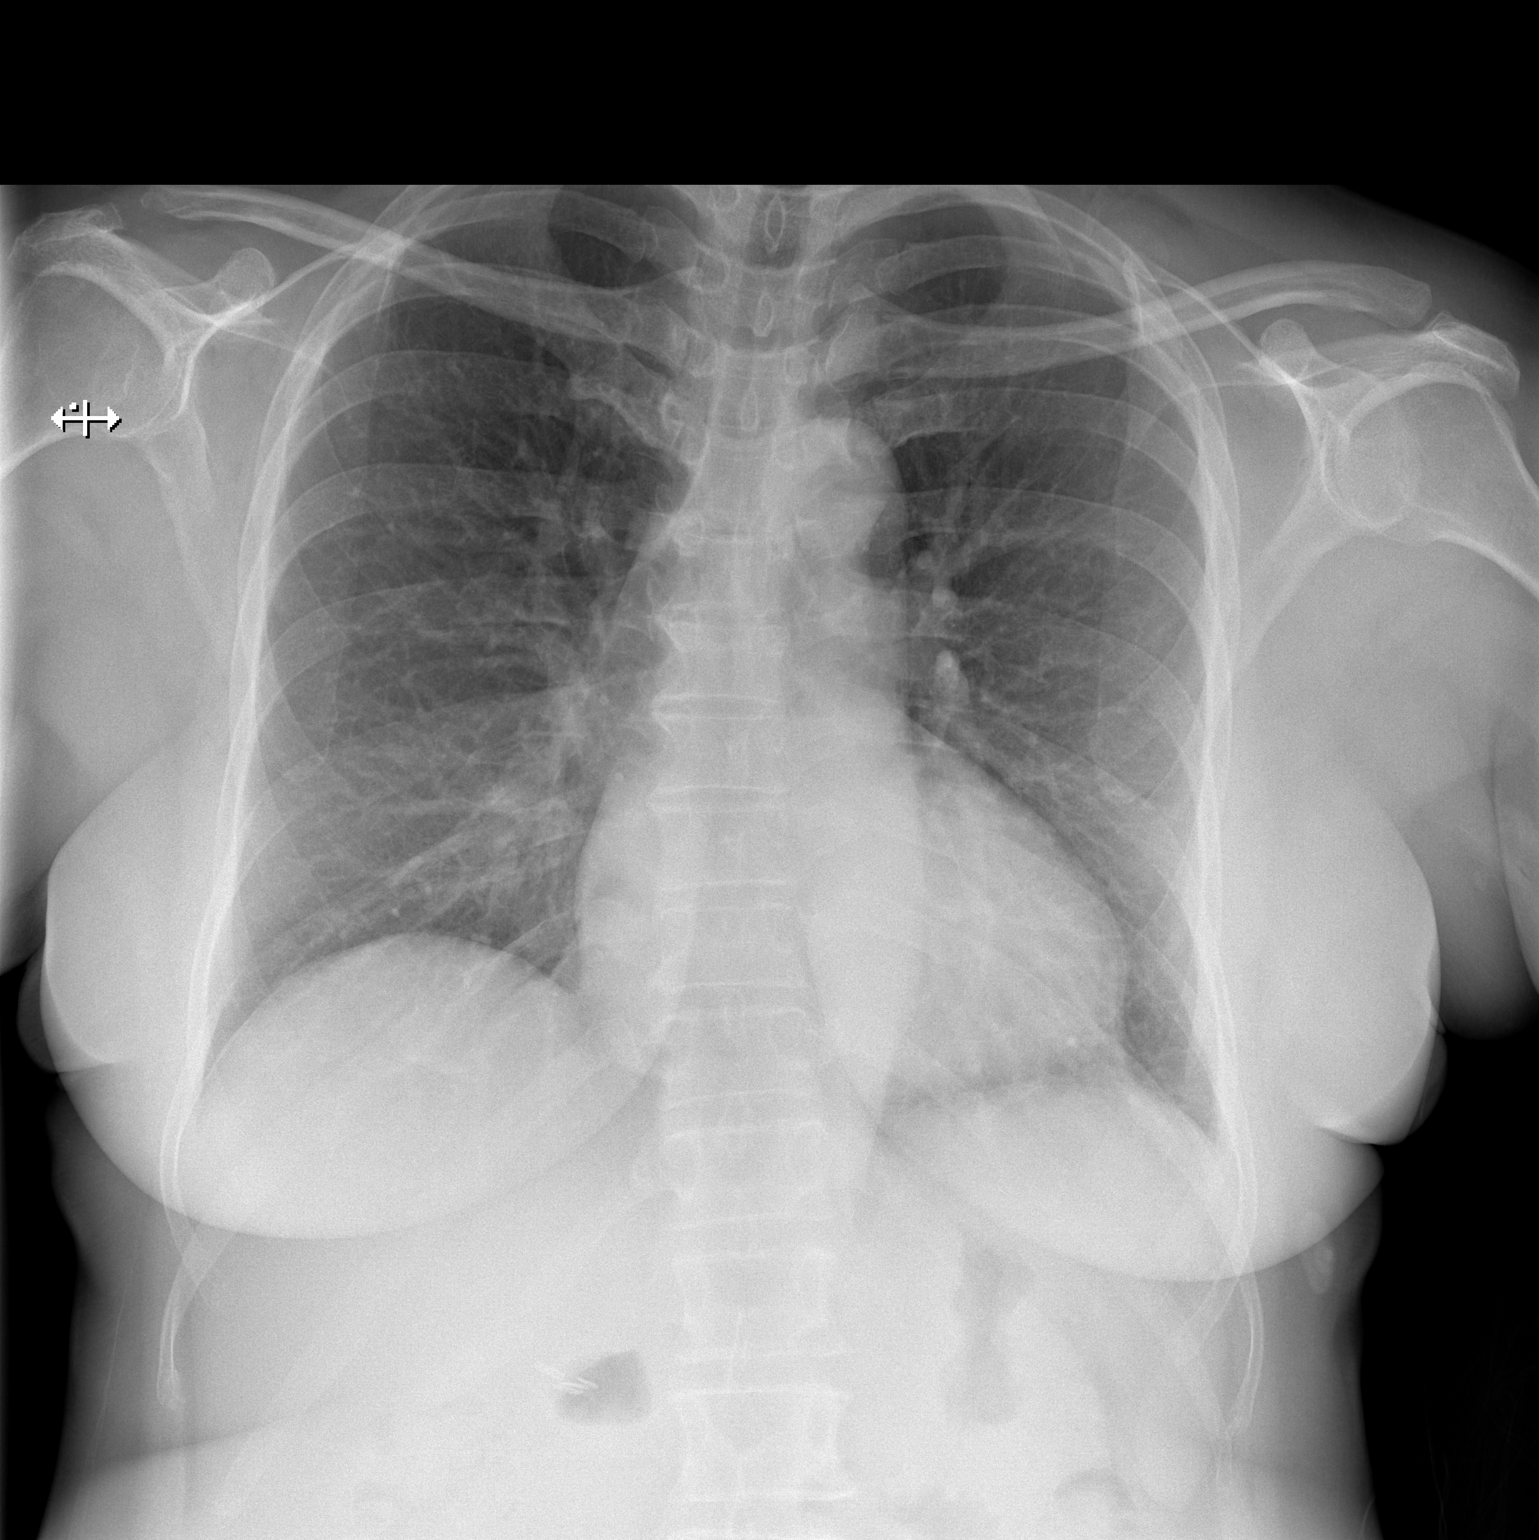

[w chest lat]
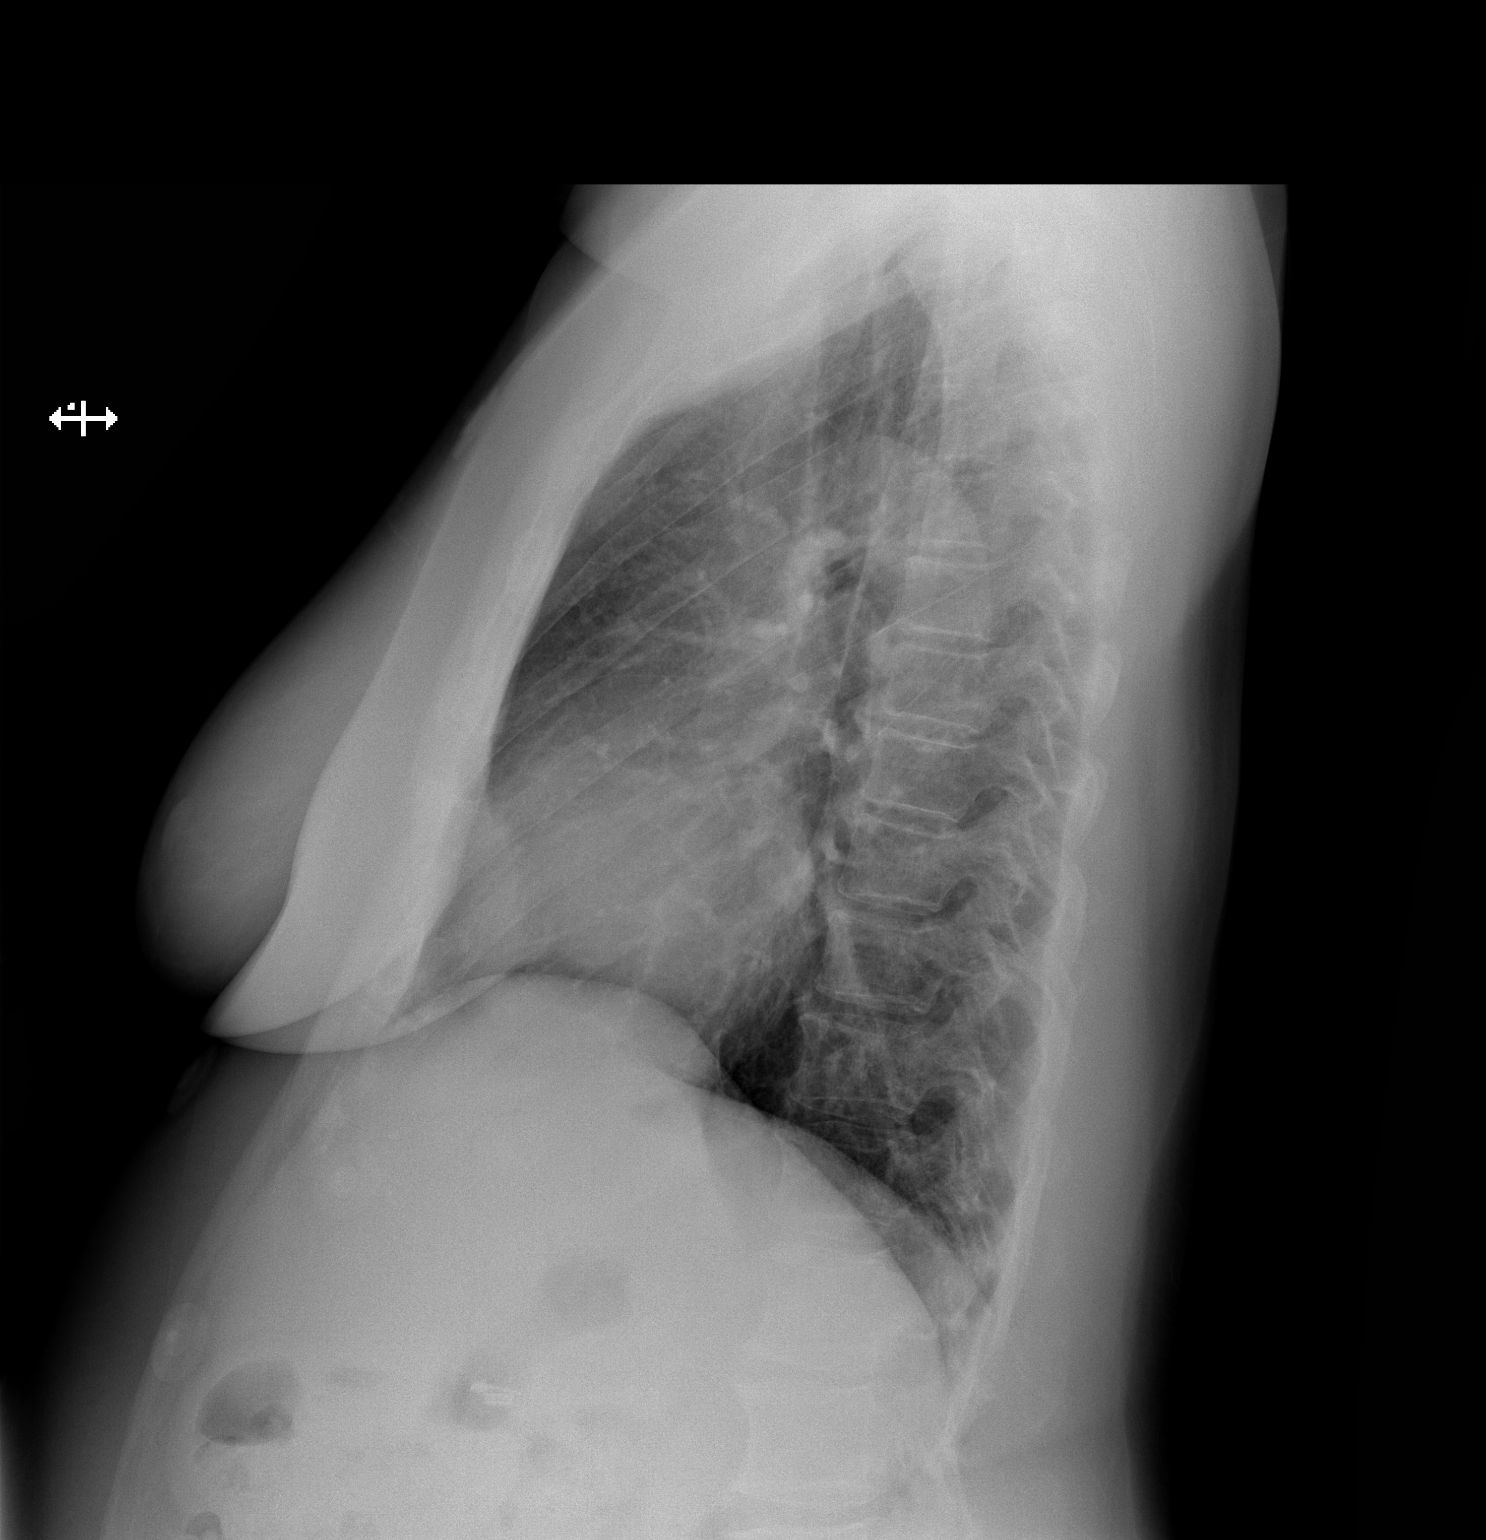

[2 of 2 positions shown; findings below may reference images not displayed]

FINDINGS: Stable mild cardiomegaly and central vascular prominence. No focal
pneumonia, collapse or consolidation. Negative for edema, effusion
or pneumothorax. Trachea is midline. Remote cholecystectomy noted.
Minor degenerative changes of the spine with a slight scoliosis.
IMPRESSION: Stable chest exam without superimposed acute process. Mild
cardiomegaly.

## 2017-01-09 ENCOUNTER — Other Ambulatory Visit: Payer: Self-pay

## 2017-01-09 MED ORDER — GLUCOSE BLOOD VI STRP: ORAL_STRIP | 5 refills | Status: DC

## 2017-01-09 MED ORDER — ACCU-CHEK NANO SMARTVIEW W/DEVICE KIT: PACK | 0 refills | Status: DC

## 2017-01-12 ENCOUNTER — Telehealth: Payer: Self-pay | Admitting: Cardiovascular Disease

## 2017-01-12 NOTE — Telephone Encounter (Signed)
New message        Pt c/o of Chest Pain: STAT if CP now or developed within 24 hours  1. Are you having CP right now?  yes 2. Are you experiencing any other symptoms (ex. SOB, nausea, vomiting, sweating)?  Have sob every morning  3. How long have you been experiencing CP? Light chest pain all week----now more intense  4. Is your CP continuous or coming and going?  Comes and goes  5. Have you taken Nitroglycerin?  no?

## 2017-01-12 NOTE — Telephone Encounter (Signed)
Spoke with pt she states that she has been intermittant chest pain x1+ week she states that she has no other sx but thinks that it may be either low BS or low potassium. She states that her BP has been running 123/76 HR 85 she states that RBS is around 88 in the am. She states that today the Cp has lasted over an hour and this is concerning to her and she would like to see Dr Tresa EndoKelly today, TK is not in the office today.pt states that she will go to the ER for eval

## 2017-01-18 ENCOUNTER — Ambulatory Visit (INDEPENDENT_AMBULATORY_CARE_PROVIDER_SITE_OTHER): Payer: Medicare HMO | Admitting: Internal Medicine

## 2017-01-18 ENCOUNTER — Encounter: Payer: Self-pay | Admitting: Internal Medicine

## 2017-01-18 VITALS — BP 128/88 | HR 81 | Ht 66.0 in | Wt 183.0 lb

## 2017-01-18 DIAGNOSIS — E1165 Type 2 diabetes mellitus with hyperglycemia: Secondary | ICD-10-CM | POA: Diagnosis not present

## 2017-01-18 DIAGNOSIS — E1159 Type 2 diabetes mellitus with other circulatory complications: Secondary | ICD-10-CM | POA: Diagnosis not present

## 2017-01-18 LAB — POCT GLYCOSYLATED HEMOGLOBIN (HGB A1C): Hemoglobin A1C: 6.5

## 2017-01-18 MED ORDER — GLIPIZIDE ER 5 MG PO TB24: 5.0000 mg | ORAL_TABLET | Freq: Every day | ORAL | 3 refills | Status: DC

## 2017-01-18 MED ORDER — INSULIN DETEMIR 100 UNIT/ML FLEXPEN: PEN_INJECTOR | SUBCUTANEOUS | 1 refills | Status: DC

## 2017-01-18 NOTE — Progress Notes (Signed)
Patient ID: Bonnie Gordon, female   DOB: 03-11-1956, 61 y.o.   MRN: [id number]   HPI: Bonnie Gordon is a 61 y.o.-year-old female, returning for follow-up for DM2, dx in 201285-, insulin-dependent, uncontrolled, with complications (CAD, PN). Last visit 3 mo ago.  Last hemoglobin A1c was: Lab Results  Component Value Date   HGBA1C 6.2 10/18/2016   HGBA1C 7.5 07/20/2016   HGBA1C 9.5 04/20/2016  01/14/2016: HbA1c 11.3%  Pt is on a regimen of: - Glipizide ER 10 >> 5 mg before breakfast. - >> stopped 1 week ago - Levemir 60 >> 45 units at bedtime - Victoza 1.2 >> 0.6 mg daily in am (decreased b/c Nausea)  Previously, per records from PCP (patient does not remember the names of the medicines): - Glimepiride 4 mg before b'fast/ Glipizide XL 10 mg in am  - Tradjenta 5 mg in am  These were stopped when she started insulin.  She was on Metformin >> diarrhea  Pt checks her sugars 2x a day: - am: 85-109 >> 120-148 >> 75-136 >> 86-118, 123 >> 69, 82-115, 135 - 2h after b'fast: n/c >> 127 >> n/c >> 126 - before lunch: n/c >> 130s >> 100, 120, 153 >> n/c - 2h after lunch: n/c - before dinner: 120s >> 130s >> 98, 205, 273 >> n/c >> 68, 113 - 2h after dinner: n/c >> 196 x1 - bedtime: n/c >> 93-149, 156 (ate late) - nighttime: n/c No lows. Lowest sugar was 80s >> 97 >> 75 >> 86 >> 69; ? hypoglycemia awareness. Highest sugar was 277 >> 309 (sweet tea) >> 273 >> 123 >> 156.  Glucometer: AccuChek Aviva  Eats fried fish once a week, no red meat.  She started to go to the gym: walks 1/2 mile and does 50 min of aerobics (class). She will start water aerobics.  - No CKD, last BUN/creatinine reviewed:  Lab Results  Component Value Date   BUN 15 10/23/2016   BUN 7 05/26/2016   CREATININE 0.81 10/23/2016   CREATININE 0.65 05/26/2016  01/13/2016: 12/0.83, glucose 472  - last set of lipids reviewed: Lab Results  Component Value Date   CHOL 141 02/29/2016   HDL 67 02/29/2016   LDLCALC 55  02/29/2016   TRIG 94 02/29/2016   CHOLHDL 2.1 02/29/2016  01/13/2016: 147/218/73/69 On Crestor 40. - last eye exam was in 12/2016 >> No DR reportedly. + small cataract. - she has numbness and tingling in her feet.  Sees podiatry.  Last TSH: 01/13/2016: TSH 1.39 Lab Results  Component Value Date   TSH 0.835 08/30/2015   She was admitted with CP 05/2016 (found to have hypokalemia, mm cramp)  ROS: Constitutional: no weight gain/no weight loss, no fatigue, + hot flushes Eyes: no blurry vision, no xerophthalmia ENT: no sore throat, no nodules palpated in throat, no dysphagia, no odynophagia, no hoarseness Cardiovascular: + CP/+ SOB/no palpitations/no leg swelling Respiratory: no cough/+ SOB/no wheezing Gastrointestinal: resolved N/no V/no D/no C/no acid reflux Musculoskeletal: no muscle aches/no joint aches Skin: no rashes, no hair loss Neurological: no tremors/no numbness/no tingling/no dizziness  I reviewed pt's medications, allergies, PMH, social hx, family hx, and changes were documented in the history of present illness. Otherwise, unchanged from my initial visit note.   Past Medical History:  Diagnosis Date  . Anxiety   . Bipolar disorder ([place])   . Chest pain    a. 01/2003 MV w/ ? ant-inflat ischemia-->Cath: LAD 20p, otw nonobs dzs-->Med Rx; b. 2009 Cath: LAD  30, LCX nl/nondominant, RCA 30, nl EF; c. 08/2014 MV: no ischemia/infarct.  . Complication of anesthesia    pt. states she is difficult to wake up  . Depression   . Diabetes mellitus    Type II  . Diabetic neuropathy ([place])   . Diverticulitis   . GERD (gastroesophageal reflux disease)   . Headache   . History of bronchitis   . Hypercholesteremia   . Hypertension   . Hypokalemia   . PONV (postoperative nausea and vomiting)   . Shortness of breath dyspnea   . Sleep apnea   . Vertigo    patient reported   Past Surgical History:  Procedure Laterality Date  . ABDOMINAL HYSTERECTOMY    . APPENDECTOMY    .  CARDIAC CATHETERIZATION  2009   normal cath  . CARDIAC CATHETERIZATION  2004   normal cath  . CHOLECYSTECTOMY    . TONSILLECTOMY/ADENOIDECTOMY/TURBINATE REDUCTION Bilateral 03/01/2015   Procedure: TONSILLECTOMY/TURBINATE REDUCTION;  Surgeon: Rozetta Nunnery, MD;  Location: [place];  Service: ENT;  Laterality: Bilateral;  . UVULOPALATOPHARYNGOPLASTY Bilateral 03/01/2015   Procedure: UVULOPALATOPHARYNGOPLASTY (UPPP);  Surgeon: Rozetta Nunnery, MD;  Location: [place];  Service: ENT;  Laterality: Bilateral;  . WISDOM TOOTH EXTRACTION     Social History   Social History  . Marital status: Divorced    Spouse name: N/A  . Number of children: 2   Occupational History  . Disability    Social History Main Topics  . Smoking status: Former Smoker    Quit date: 08/07/1982  . Smokeless tobacco: Never Used  . Alcohol use Yes     Comment: Drinks during the weekends 1 beer or 1 drink of liquor per day   . Drug use: No  . Sexual activity: Yes    Partners: Male    Birth control/ protection: Surgical   Current Outpatient Prescriptions on File Prior to Visit  Medication Sig Dispense Refill  . amLODipine (NORVASC) 5 MG tablet Take 1 tablet (5 mg total) by mouth daily. 90 tablet 3  . aspirin EC 81 MG tablet Take 81 mg by mouth daily.    . Blood Glucose Monitoring Suppl (ACCU-CHEK NANO SMARTVIEW) w/Device KIT Use to check sugar daily. 1 kit 0  . celecoxib (CELEBREX) 200 MG capsule Take 200 mg by mouth daily as needed.     . fluticasone (FLONASE) 50 MCG/ACT nasal spray Place 2 sprays into both nostrils daily. 16 g 0  . glipiZIDE (GLUCOTROL XL) 5 MG 24 hr tablet Take 1 tablet (5 mg total) by mouth daily with breakfast. (Patient taking differently: Take by mouth daily with breakfast. ) 90 tablet 3  . glipiZIDE (GLUCOTROL) 5 MG tablet TAKE 1 TAB BY MOUTH EVERY DAY WITH SUPPER IN ADDITION TO THE 10MG THAT YOU ARE TAKING IN THE MORNING 90 tablet 0  . glucose blood (ACCU-CHEK SMARTVIEW) test strip Use as  instructed to check sugar 2 times daily 200 each 5  . Insulin Detemir (LEVEMIR FLEXTOUCH) 100 UNIT/ML Pen INJECT 45 UNITS INTO THE SKIN EVERY DAY AT 10 PM 45 pen 1  . Insulin Pen Needle 32G X 4 MM MISC Use daily with insulin pen. 100 each 5  . liraglutide 18 MG/3ML SOPN Inject 0.2 mLs (1.2 mg total) into the skin daily. 6 pen 3  . metoprolol succinate (TOPROL XL) 25 MG 24 hr tablet Take 1 tablet (25 mg total) by mouth daily. 30 tablet 6  . omeprazole (PRILOSEC) 20 MG capsule Take 20 mg  by mouth daily.    . rosuvastatin (CRESTOR) 40 MG tablet TAKE 1 TABLET BY MOUTH EVERY DAY 90 tablet 3  . spironolactone (ALDACTONE) 25 MG tablet Take 25 mg by mouth daily.    . valACYclovir (VALTREX) 500 MG tablet Take 500 mg by mouth daily.    . VENTOLIN HFA 108 (90 BASE) MCG/ACT inhaler Inhale 2 puffs into the lungs every 4 (four) hours as needed for wheezing or shortness of breath.   0   No current facility-administered medications on file prior to visit.    Allergies  Allergen Reactions  . Other Other (See Comments)    No Blood Products, personal preference  . Latex Itching and Rash   Family History  Problem Relation Age of Onset  . Diabetes Mother   . Gout Mother   . Hypertension Mother   . Arthritis Mother   . Heart disease Father   . Heart failure Sister   . Arthritis Sister   . Heart disease Maternal Grandmother   . Diabetes Maternal Grandmother   . Heart disease Paternal Grandmother   . Diabetes Paternal Grandmother   . Diabetes Sister   . Bursitis Sister   . Crohn's disease Sister    PE: BP 128/88 (BP Location: Left Arm, Patient Position: Sitting)   Pulse 81   Ht _0  (1.676 m)   Wt 183 lb (83 kg)   SpO2 94%   BMI 29.54 kg/m  Wt Readings from Last 3 Encounters:  01/18/17 183 lb (83 kg)  10/23/16 181 lb (82.1 kg)  10/18/16 181 lb (82.1 kg)   Constitutional: overweight, in NAD Eyes: PERRLA, EOMI, no exophthalmos ENT: moist mucous membranes, no thyromegaly, no cervical  lymphadenopathy Cardiovascular: RRR, No MRG Respiratory: CTA B Gastrointestinal: abdomen soft, NT, ND, BS+ Musculoskeletal: no deformities, strength intact in all 4 Skin: moist, warm, no rashes Neurological: no tremor with outstretched hands, DTR normal in all 4  ASSESSMENT: 1. DM2, insulin-dependent, now more controlled, with complications - CAD - PN  PLAN:  1. Patient with long-standing, now controlled diabetes, on basal insulin, low dose Victoza and am Glipizide XL. She ran out her glipizide at dinnertime >> sugars only slightly higher at bedtime >> will keep her off. As her sugars are excellent in am >> will decrease her insulin further. - he continues to swim and watch her diet >> we discussed that we may also stop Glipizide XL at next visit. - I suggested to:  Patient Instructions  Please continue: - Victoza 0.6 mg daily in am - Glipizide ER 5 mg before b'fast  Please decrease: - Levemir to 30 units at bedtime  Please return in 3 months with your sugar log.   - today, HbA1c is 6.5% (slightly higher, still at goal) - continue checking sugars at different times of the day - check 1x a day, rotating checks - advised for yearly eye exams >> she is UTD - Return to clinic in 3 mo with sugar log    Philemon Kingdom, MD PhD [facility] Endocrinology

## 2017-01-18 NOTE — Patient Instructions (Addendum)
Please continue: - Victoza 0.6 mg daily in am - Glipizide ER 5 mg before b'fast  Please decrease: - Levemir to 30 units at bedtime  Please return in 3 months with your sugar log.

## 2017-01-18 NOTE — Addendum Note (Signed)
Addended by: Darene LamerHOMPSON, Raye Slyter T on: 01/18/2017 10:49 AM   Modules accepted: Orders

## 2017-01-31 ENCOUNTER — Other Ambulatory Visit: Payer: Self-pay | Admitting: Internal Medicine

## 2017-02-05 ENCOUNTER — Other Ambulatory Visit: Payer: Self-pay | Admitting: Internal Medicine

## 2017-02-07 ENCOUNTER — Other Ambulatory Visit: Payer: Self-pay | Admitting: Internal Medicine

## 2017-03-22 ENCOUNTER — Other Ambulatory Visit: Payer: Self-pay | Admitting: Cardiovascular Disease

## 2017-04-09 ENCOUNTER — Other Ambulatory Visit: Payer: Self-pay | Admitting: Cardiovascular Disease

## 2017-04-10 NOTE — Telephone Encounter (Signed)
REFILL 

## 2017-04-25 ENCOUNTER — Encounter: Payer: Self-pay | Admitting: Internal Medicine

## 2017-04-25 ENCOUNTER — Ambulatory Visit (INDEPENDENT_AMBULATORY_CARE_PROVIDER_SITE_OTHER): Payer: Medicare HMO | Admitting: Internal Medicine

## 2017-04-25 VITALS — BP 128/82 | HR 82 | Ht 66.5 in | Wt 188.0 lb

## 2017-04-25 DIAGNOSIS — E1159 Type 2 diabetes mellitus with other circulatory complications: Secondary | ICD-10-CM

## 2017-04-25 DIAGNOSIS — Z23 Encounter for immunization: Secondary | ICD-10-CM | POA: Diagnosis not present

## 2017-04-25 DIAGNOSIS — E1165 Type 2 diabetes mellitus with hyperglycemia: Secondary | ICD-10-CM

## 2017-04-25 DIAGNOSIS — E785 Hyperlipidemia, unspecified: Secondary | ICD-10-CM | POA: Diagnosis not present

## 2017-04-25 LAB — POCT GLYCOSYLATED HEMOGLOBIN (HGB A1C): Hemoglobin A1C: 6.8

## 2017-04-25 MED ORDER — LIRAGLUTIDE 18 MG/3ML ~~LOC~~ SOPN: 0.6000 mg | PEN_INJECTOR | Freq: Every day | SUBCUTANEOUS | 3 refills | Status: DC

## 2017-04-25 MED ORDER — INSULIN DETEMIR 100 UNIT/ML FLEXPEN: PEN_INJECTOR | SUBCUTANEOUS | 1 refills | Status: DC

## 2017-04-25 NOTE — Patient Instructions (Signed)
Please decrease: - Levemir to 25 units at bedtime (can decrease further to 20 units if sugars stay <110 in am).  Please continue: - Victoza 0.6-1.2 mg in am - Glipizide ER 5 mg before b'fast  Please return in 4 months with your sugar log.

## 2017-04-25 NOTE — Progress Notes (Signed)
Patient ID: Bonnie Gordon, female   DOB: Dec 29, 1955, 61 y.o.   MRN: [id number]   HPI: Bonnie Gordon is a 61 y.o.-year-old female, returning for follow-up for DM2, dx in 201285-, insulin-dependent, uncontrolled, with complications (CAD, PN). Last visit 3 mo ago.  Last hemoglobin A1c was: Lab Results  Component Value Date   HGBA1C 6.5 01/18/2017   HGBA1C 6.2 10/18/2016   HGBA1C 7.5 07/20/2016   HGBA1C 9.5 04/20/2016   HGBA1C 8.1 (H) 08/30/2015   HGBA1C 7.1 (H) 02/25/2015   HGBA1C 7.0 (H) 08/26/2014   HGBA1C (H) 09/16/2009    6.5 (NOTE) The ADA recommends the following therapeutic goal for glycemic control related to Hgb A1c measurement: Goal of therapy: <6.5 Hgb A1c  Reference: American Diabetes Association: Clinical Practice Recommendations 2010, Diabetes Care, 2010, 33: (Suppl  1).  01/14/2016: HbA1c 11.3%  Pt is on a regimen of: - Glipizide ER 10 >> 5 mg before breakfast. - Levemir 60 >> 45 >> 30 units at bedtime - Victoza 1.2 >> 0.6 mg daily in am (decreased b/c nausea  She was also on: - Glipizide 5 mg before dinner >> stopped  - Glimepiride 4 mg before b'fast/ Glipizide XL 10 mg in am  - Tradjenta 5 mg in am  These were stopped when she started insulin.  She was on Metformin >> diarrhea.  Pt checks her sugars 2x a day: - am: 86-118, 123 >> 69, 82-115, 135 >> 83-105 - 2h after b'fast: 127 >> n/c >> 126 >> n/c - before lunch: 100, 120, 153 >> n/c - 2h after lunch: n/c - before dinner:98, 205, 273 >> n/c >> 68, 113 >> 80-120 - 2h after dinner: n/c >> 196 x1 - bedtime: n/c >> 93-149, 156 (ate late) >> 83, 96-100 - nighttime: n/c Lowest sugar was 69 >> 64; No hypoglycemia awareness, but no lows either. Highest sugar was 156 >> 200s  Glucometer: AccuChek [employer]iva  Eats fried fish once a week, no red meat. Some icecream, crispy cream and cake.   She is walking >> not lately, though. She restarted water aerobics but stop exercising before the cruise, 2 mo ago.  - No CKD, last  BUN/creatinine reviewed:  Lab Results  Component Value Date   BUN 15 10/23/2016   BUN 7 05/26/2016   CREATININE 0.81 10/23/2016   CREATININE 0.65 05/26/2016  01/13/2016: 12/0.83, glucose 472  - last set of lipids reviewed: Lab Results  Component Value Date   CHOL 141 02/29/2016   HDL 67 02/29/2016   LDLCALC 55 02/29/2016   TRIG 94 02/29/2016   CHOLHDL 2.1 02/29/2016  01/13/2016: 147/218/73/69 On Crestor 40. - last eye exam was in 12/2016 >> No DR reportedly, + small cataract. - she has numbness and tingling in her feet. Sees podiatry. ASA 81.  Last TSH: 01/13/2016: TSH 1.39 Lab Results  Component Value Date   TSH 0.835 08/30/2015   She was admitted with CP 05/2016 (found to have hypokalemia, mm cramp)  ROS: Constitutional: no weight gain/no weight loss, no fatigue, no subjective hyperthermia, no subjective hypothermia Eyes: no blurry vision, no xerophthalmia ENT: no sore throat, no nodules palpated in throat, no dysphagia, no odynophagia, no hoarseness Cardiovascular: no CP/no SOB/no palpitations/no leg swelling Respiratory: no cough/no SOB/no wheezing Gastrointestinal: no N/no V/no D/no C/no acid reflux Musculoskeletal: no muscle aches/no joint aches Skin: no rashes, no hair loss Neurological: no tremors/+ numbness/+ tingling - both better/no dizziness  I reviewed pt's medications, allergies, PMH, social hx, family hx, and changes  were documented in the history of present illness. Otherwise, unchanged from my initial visit note.  Past Medical History:  Diagnosis Date  . Anxiety   . Bipolar disorder ([place])   . Chest pain    a. 01/2003 MV w/ ? ant-inflat ischemia-->Cath: LAD 20p, otw nonobs dzs-->Med Rx; b. 2009 Cath: LAD 30, LCX nl/nondominant, RCA 30, nl EF; c. 08/2014 MV: no ischemia/infarct.  . Complication of anesthesia    pt. states she is difficult to wake up  . Depression   . Diabetes mellitus    Type II  . Diabetic neuropathy ([place])   . Diverticulitis   .  GERD (gastroesophageal reflux disease)   . Headache   . History of bronchitis   . Hypercholesteremia   . Hypertension   . Hypokalemia   . PONV (postoperative nausea and vomiting)   . Shortness of breath dyspnea   . Sleep apnea   . Vertigo    patient reported   Past Surgical History:  Procedure Laterality Date  . ABDOMINAL HYSTERECTOMY    . APPENDECTOMY    . CARDIAC CATHETERIZATION  2009   normal cath  . CARDIAC CATHETERIZATION  2004   normal cath  . CHOLECYSTECTOMY    . TONSILLECTOMY/ADENOIDECTOMY/TURBINATE REDUCTION Bilateral 03/01/2015   Procedure: TONSILLECTOMY/TURBINATE REDUCTION;  Surgeon: Rozetta Nunnery, MD;  Location: [facility];  Service: ENT;  Laterality: Bilateral;  . UVULOPALATOPHARYNGOPLASTY Bilateral 03/01/2015   Procedure: UVULOPALATOPHARYNGOPLASTY (UPPP);  Surgeon: Rozetta Nunnery, MD;  Location: [facility];  Service: ENT;  Laterality: Bilateral;  . WISDOM TOOTH EXTRACTION     Social History   Social History  . Marital status: Divorced    Spouse name: N/A  . Number of children: 2   Occupational History  . Disability    Social History Main Topics  . Smoking status: Former Smoker    Quit date: 08/07/1982  . Smokeless tobacco: Never Used  . Alcohol use Yes     Comment: Drinks during the weekends 1 beer or 1 drink of liquor per day   . Drug use: No  . Sexual activity: Yes    Partners: Male    Birth control/ protection: Surgical   Current Outpatient Prescriptions on File Prior to Visit  Medication Sig Dispense Refill  . amLODipine (NORVASC) 5 MG tablet TAKE 1 TABLET (5 MG TOTAL) BY MOUTH DAILY. 90 tablet 3  . aspirin EC 81 MG tablet Take 81 mg by mouth daily.    . Blood Glucose Monitoring Suppl (ACCU-CHEK NANO SMARTVIEW) w/Device KIT USE TO CHECK SUGAR DAILY. 1 kit 0  . celecoxib (CELEBREX) 200 MG capsule Take 200 mg by mouth daily as needed.     . fluticasone (FLONASE) 50 MCG/ACT nasal spray Place 2 sprays into both nostrils daily. 16 g 0  . glipiZIDE  (GLUCOTROL XL) 5 MG 24 hr tablet Take 1 tablet (5 mg total) by mouth daily with breakfast. 90 tablet 3  . glucose blood (ACCU-CHEK SMARTVIEW) test strip Use as instructed to check sugar 2 times daily 200 each 5  . Insulin Detemir (LEVEMIR FLEXTOUCH) 100 UNIT/ML Pen INJECT 30 UNITS INTO THE SKIN EVERY DAY AT bedtime 45 pen 1  . Insulin Pen Needle 32G X 4 MM MISC Use daily with insulin pen. 100 each 5  . LEVEMIR FLEXTOUCH 100 UNIT/ML Pen INJECT 60 UNITS INTO THE SKIN EVERY DAY AT 10 PM 45 mL 1  . liraglutide 18 MG/3ML SOPN Inject 0.2 mLs (1.2 mg total) into the skin daily. 6  pen 3  . metoprolol succinate (TOPROL-XL) 25 MG 24 hr tablet Take 1 tablet (25 mg total) by mouth daily. Please schedule appointment for refills 30 tablet 3  . omeprazole (PRILOSEC) 20 MG capsule Take 20 mg by mouth daily.    . rosuvastatin (CRESTOR) 40 MG tablet TAKE 1 TABLET BY MOUTH EVERY DAY 90 tablet 3  . spironolactone (ALDACTONE) 25 MG tablet Take 25 mg by mouth daily.    . valACYclovir (VALTREX) 500 MG tablet Take 500 mg by mouth daily.    . VENTOLIN HFA 108 (90 BASE) MCG/ACT inhaler Inhale 2 puffs into the lungs every 4 (four) hours as needed for wheezing or shortness of breath.   0   No current facility-administered medications on file prior to visit.    Allergies  Allergen Reactions  . Other Other (See Comments)    No Blood Products, personal preference  . Latex Itching and Rash   Family History  Problem Relation Age of Onset  . Diabetes Mother   . Gout Mother   . Hypertension Mother   . Arthritis Mother   . Heart disease Father   . Heart failure Sister   . Arthritis Sister   . Heart disease Maternal Grandmother   . Diabetes Maternal Grandmother   . Heart disease Paternal Grandmother   . Diabetes Paternal Grandmother   . Diabetes Sister   . Bursitis Sister   . Crohn's disease Sister    PE: Ht 5' 6.5" (1.689 m)   Wt 188 lb (85.3 kg)   BMI 29.89 kg/m  Wt Readings from Last 3 Encounters:   04/25/17 188 lb (85.3 kg)  01/18/17 183 lb (83 kg)  10/23/16 181 lb (82.1 kg)   Constitutional: overweight, in NAD Eyes: PERRLA, EOMI, no exophthalmos ENT: moist mucous membranes, no thyromegaly, no cervical lymphadenopathy Cardiovascular: RRR, No MRG Respiratory: CTA B Gastrointestinal: abdomen soft, NT, ND, BS+ Musculoskeletal: no deformities, strength intact in all 4 Skin: moist, warm, no rashes Neurological: no tremor with outstretched hands, DTR normal in all 4  ASSESSMENT: 1. DM2, insulin-dependent, now more controlled, with complications - CAD - PN  2. HL  PLAN:  1. Patient with long-standing, now better controlled DM, with stable control since last visit. Her sugars are at goal, with few spikes due to dietary indiscretions. She usually eats out on Sunday evening and has dessert at the end of the meal. Occasionally, sugars increase to 200 postprandially. - In the last several months, we were able to decrease her Levemir dose from 60 units at night to now 30 units, and, based on the sugars at home, we can decrease it even further, to 25 units daily. - We'll not make further changes in her medication regimen, but I'm hoping that we can start glipizide or at least decrease the dose at next visit. - She has not been exercising in the last 2 months, but she plans to restart - I suggested to:  Patient Instructions  Please decrease: - Levemir to 25 units at bedtime (can decrease further to 20 units if sugars stay <110 in am).  Please continue: - Victoza 0.6-1.2 mg in am - Glipizide ER 5 mg before b'fast  Please return in 4 months with your sugar log.   - today, HbA1c is 6.8% (higher, but still at goal) - continue checking sugars at different times of the day - check 1x a day, rotating checks - advised for yearly eye exams >> she is UTD  - will give  her the flu shot today - Return to clinic in 4 mo with sugar log  2. HL - Latest lipid level reviewed >> great! On Crestor  high dose. No SE from the statin. - She was on restart exercise, which will also help  Philemon Kingdom, MD PhD [facility] Endocrinology

## 2017-04-25 NOTE — Addendum Note (Signed)
Addended by: Darene Lamer T on: 04/25/2017 09:45 AM   Modules accepted: Orders

## 2017-05-04 DIAGNOSIS — M47816 Spondylosis without myelopathy or radiculopathy, lumbar region: Secondary | ICD-10-CM | POA: Diagnosis not present

## 2017-06-04 DIAGNOSIS — M549 Dorsalgia, unspecified: Secondary | ICD-10-CM | POA: Diagnosis not present

## 2017-06-10 ENCOUNTER — Other Ambulatory Visit: Payer: Self-pay

## 2017-06-10 ENCOUNTER — Emergency Department (HOSPITAL_COMMUNITY)
Admission: EM | Admit: 2017-06-10 | Discharge: 2017-06-10 | Disposition: A | Discharge status: Home / Self Care | Payer: Medicare HMO | Attending: Emergency Medicine | Admitting: Emergency Medicine

## 2017-06-10 ENCOUNTER — Encounter (HOSPITAL_COMMUNITY): Payer: Self-pay | Admitting: *Deleted

## 2017-06-10 DIAGNOSIS — R51 Headache: Secondary | ICD-10-CM | POA: Insufficient documentation

## 2017-06-10 DIAGNOSIS — E119 Type 2 diabetes mellitus without complications: Secondary | ICD-10-CM | POA: Insufficient documentation

## 2017-06-10 DIAGNOSIS — Z7982 Long term (current) use of aspirin: Secondary | ICD-10-CM | POA: Diagnosis not present

## 2017-06-10 DIAGNOSIS — Z87891 Personal history of nicotine dependence: Secondary | ICD-10-CM | POA: Diagnosis not present

## 2017-06-10 DIAGNOSIS — Z794 Long term (current) use of insulin: Secondary | ICD-10-CM | POA: Diagnosis not present

## 2017-06-10 DIAGNOSIS — I10 Essential (primary) hypertension: Secondary | ICD-10-CM | POA: Insufficient documentation

## 2017-06-10 DIAGNOSIS — Z79899 Other long term (current) drug therapy: Secondary | ICD-10-CM | POA: Insufficient documentation

## 2017-06-10 DIAGNOSIS — I2510 Atherosclerotic heart disease of native coronary artery without angina pectoris: Secondary | ICD-10-CM | POA: Diagnosis not present

## 2017-06-10 DIAGNOSIS — Z91040 Latex allergy status: Secondary | ICD-10-CM | POA: Diagnosis not present

## 2017-06-10 DIAGNOSIS — R519 Headache, unspecified: Secondary | ICD-10-CM

## 2017-06-10 MED ORDER — METOCLOPRAMIDE HCL 5 MG/ML IJ SOLN
10.0000 mg | Freq: Once | INTRAMUSCULAR | Status: AC
  Administered 2017-06-10: 10 mg via INTRAVENOUS
  Filled 2017-06-10: qty 2

## 2017-06-10 MED ORDER — FENTANYL CITRATE (PF) 100 MCG/2ML IJ SOLN
50.0000 ug | Freq: Once | INTRAMUSCULAR | Status: AC
Start: 2017-06-10 — End: 2017-06-10
  Administered 2017-06-10: 50 ug via INTRAVENOUS
  Filled 2017-06-10: qty 2

## 2017-06-10 MED ORDER — SODIUM CHLORIDE 0.9 % IV BOLUS (SEPSIS)
1000.0000 mL | Freq: Once | INTRAVENOUS | Status: AC
  Administered 2017-06-10: 1000 mL via INTRAVENOUS

## 2017-06-10 MED ORDER — DIPHENHYDRAMINE HCL 50 MG/ML IJ SOLN
25.0000 mg | Freq: Once | INTRAMUSCULAR | Status: AC
  Administered 2017-06-10: 25 mg via INTRAVENOUS
  Filled 2017-06-10: qty 1

## 2017-06-10 NOTE — ED Notes (Signed)
Pt does have frequent headaches, however pt stated that dizzy spells are new. Pt denies having any fainting spells / blackouts. Pt denies SOB, Chest pain.

## 2017-06-10 NOTE — Discharge Instructions (Signed)
Please read instructions below. °You can take tylenol every 4-6 hours as needed for headache. °Schedule an appointment with your primary care provider to follow up on your headache and discuss preventative treatment. °Return to the ER for severely worsening headache, vision changes, fever, weakness or numbness, or new or concerning symptoms. ° °

## 2017-06-10 NOTE — ED Provider Notes (Signed)
[facility] DEPT Provider Note   CSN: [id number] Arrival date & time: 06/10/17  1057     History   Chief Complaint Chief Complaint  Patient presents with  . Headache    HPI Bonnie Gordon is a 61 y.o. female w PMHX anxiety, bipolar d/o, type 2 diabetes, chronic headache, hypertension, CAD, into the ED with acute onset of headache that began around 400s this morning.  Patient states pain is a sharp pain, not relieved with Advil.  She denies associated photophobia, vision changes, nausea, numbness or weakness, or any other complaints.  Patient states she has been having more headaches recently, and her primary care is going to schedule her for a CT scan, however that has not been scheduled yet.   The history is provided by the patient.    Past Medical History:  Diagnosis Date  . Anxiety   . Bipolar disorder ([place])   . Chest pain    a. 01/2003 MV w/ ? ant-inflat ischemia-->Cath: LAD 20p, otw nonobs dzs-->Med Rx; b. 2009 Cath: LAD 30, LCX nl/nondominant, RCA 30, nl EF; c. 08/2014 MV: no ischemia/infarct.  . Complication of anesthesia    pt. states she is difficult to wake up  . Depression   . Diabetes mellitus    Type II  . Diabetic neuropathy ([place])   . Diverticulitis   . GERD (gastroesophageal reflux disease)   . Headache   . History of bronchitis   . Hypercholesteremia   . Hypertension   . Hypokalemia   . PONV (postoperative nausea and vomiting)   . Shortness of breath dyspnea   . Sleep apnea   . Vertigo    patient reported    Patient Active Problem List   Diagnosis Date Noted  . Poorly controlled type 2 diabetes mellitus with circulatory disorder ([place]) 03/01/2016  . RBBB 03/02/2015  . Obstructive apnea 03/01/2015  . CAD (coronary artery disease) 07/08/2014  . Essential hypertension 07/08/2014  . Hyperlipidemia LDL goal <70 07/08/2014  . OSA on CPAP 07/08/2014  . Chest pain 07/08/2014  . Constipation 07/22/2013  . Yeast vaginitis  07/22/2013    Past Surgical History:  Procedure Laterality Date  . ABDOMINAL HYSTERECTOMY    . APPENDECTOMY    . CARDIAC CATHETERIZATION  2009   normal cath  . CARDIAC CATHETERIZATION  2004   normal cath  . CHOLECYSTECTOMY    . TONSILLECTOMY    . WISDOM TOOTH EXTRACTION      OB History    No data available       Home Medications    Prior to Admission medications   Medication Sig Start Date End Date Taking? Authorizing Provider  amLODipine (NORVASC) 5 MG tablet TAKE 1 TABLET (5 MG TOTAL) BY MOUTH DAILY. 04/10/17   Troy Sine, MD  aspirin EC 81 MG tablet Take 81 mg by mouth daily.    [provider]  baclofen (LIORESAL) 10 MG tablet  04/05/17   [provider]  Blood Glucose Monitoring Suppl (ACCU-CHEK NANO SMARTVIEW) w/Device KIT USE TO CHECK SUGAR DAILY. 02/05/17   Philemon Kingdom, MD  celecoxib (CELEBREX) 200 MG capsule Take 200 mg by mouth daily as needed.     [provider]  fluticasone (FLONASE) 50 MCG/ACT nasal spray Place 2 sprays into both nostrils daily. 09/24/15   Delsa Grana, PA-C  glipiZIDE (GLUCOTROL XL) 5 MG 24 hr tablet Take 1 tablet (5 mg total) by mouth daily with breakfast. 01/18/17   Philemon Kingdom, MD  glucose blood (ACCU-CHEK SMARTVIEW) test strip Use as instructed to check sugar 2 times daily 01/09/17   Philemon Kingdom, MD  hydrOXYzine (VISTARIL) 50 MG capsule  04/01/17   [provider]  Insulin Detemir (LEVEMIR FLEXTOUCH) 100 UNIT/ML Pen INJECT 25 UNITS INTO THE SKIN EVERY DAY AT bedtime 04/25/17   Philemon Kingdom, MD  Insulin Pen Needle 32G X 4 MM MISC Use daily with insulin pen. 10/24/16   Philemon Kingdom, MD  liraglutide 18 MG/3ML SOPN Inject 0.1-0.2 mLs (0.6-1.2 mg total) into the skin daily. 04/25/17   Philemon Kingdom, MD  metoCLOPramide (REGLAN) 10 MG tablet  04/06/17   [provider]  metoprolol succinate (TOPROL-XL) 25 MG 24 hr tablet Take 1 tablet (25 mg total) by mouth daily. Please schedule  appointment for refills 03/22/17   Troy Sine, MD  naproxen (NAPROSYN) 500 MG tablet  04/05/17   [provider]  omeprazole (PRILOSEC) 20 MG capsule Take 20 mg by mouth daily. 09/22/16   [provider]  rosuvastatin (CRESTOR) 40 MG tablet TAKE 1 TABLET BY MOUTH EVERY DAY 09/25/16   Troy Sine, MD  spironolactone (ALDACTONE) 25 MG tablet Take 25 mg by mouth daily. 09/06/16   [provider]  valACYclovir (VALTREX) 500 MG tablet Take 500 mg by mouth daily. 05/24/16   [provider]  VENTOLIN HFA 108 (90 BASE) MCG/ACT inhaler Inhale 2 puffs into the lungs every 4 (four) hours as needed for wheezing or shortness of breath.  02/12/15   [provider]    Family History Family History  Problem Relation Age of Onset  . Diabetes Mother   . Gout Mother   . Hypertension Mother   . Arthritis Mother   . Heart disease Father   . Heart failure Sister   . Arthritis Sister   . Heart disease Maternal Grandmother   . Diabetes Maternal Grandmother   . Heart disease Paternal Grandmother   . Diabetes Paternal Grandmother   . Diabetes Sister   . Bursitis Sister   . Crohn's disease Sister     Social History Social History   Tobacco Use  . Smoking status: Former Smoker    Last attempt to quit: 08/07/1982    Years since quitting: 34.8  . Smokeless tobacco: Never Used  Substance Use Topics  . Alcohol use: Yes    Comment: occasional / social  . Drug use: No     Allergies   Other and Latex   Review of Systems Review of Systems  Constitutional: Negative for fever.  Eyes: Negative for photophobia and visual disturbance.  Respiratory: Negative for shortness of breath.   Cardiovascular: Negative for chest pain.  Musculoskeletal: Negative for neck pain and neck stiffness.  Neurological: Positive for headaches. Negative for syncope, facial asymmetry, weakness and numbness.  All other systems reviewed and are negative.    Physical Exam Updated  Vital Signs BP 125/86   Pulse 72   Temp 98.3 F (36.8 C) (Oral)   Resp 17   Ht 5' 6" (1.676 m)   Wt 83.5 kg (184 lb)   SpO2 98%   BMI 29.70 kg/m   Physical Exam  Constitutional: She is oriented to person, place, and time. She appears well-developed and well-nourished. She does not appear ill. No distress.  HENT:  Head: Normocephalic and atraumatic.  Mouth/Throat: Oropharynx is clear and moist.  Eyes: Conjunctivae and EOM are normal. Pupils are equal, round, and reactive to light.  Neck: Normal range of motion.  Neck supple. No neck rigidity.  Cardiovascular: Normal rate, regular rhythm, normal heart sounds and intact distal pulses.  Pulmonary/Chest: Effort normal and breath sounds normal. No respiratory distress.  Abdominal: Soft. Bowel sounds are normal. She exhibits no distension. There is no tenderness.  Neurological: She is alert and oriented to person, place, and time. GCS eye subscore is 4. GCS verbal subscore is 5. GCS motor subscore is 6.  Mental Status:  Alert, oriented, thought content appropriate, able to give a coherent history. Speech fluent without evidence of aphasia. Able to follow 2 step commands without difficulty.  Cranial Nerves:  II:  Peripheral visual fields grossly normal, pupils equal, round, reactive to light III,IV, VI: ptosis not present, extra-ocular motions intact bilaterally  V,VII: smile symmetric, facial light touch sensation equal VIII: hearing grossly normal to voice  X: uvula elevates symmetrically  XI: bilateral shoulder shrug symmetric and strong XII: midline tongue extension without fassiculations Motor:  Normal tone. 5/5 in upper and lower extremities bilaterally including strong and equal grip strength and dorsiflexion/plantar flexion Sensory: Pinprick and light touch normal in all extremities.  Deep Tendon Reflexes: 2+ and symmetric in the biceps and patella Cerebellar: normal finger-to-nose with bilateral upper extremities Gait: normal  gait and balance CV: distal pulses palpable throughout    Skin: Skin is warm.  Psychiatric: She has a normal mood and affect. Her behavior is normal.  Nursing note and vitals reviewed.    ED Treatments / Results  Labs (all labs ordered are listed, but only abnormal results are displayed) Labs Reviewed - No data to display  EKG  EKG Interpretation None       Radiology No results found.  Procedures Procedures (including critical care time)  Medications Ordered in ED Medications  sodium chloride 0.9 % bolus 1,000 mL (0 mLs Intravenous Stopped 06/10/17 1546)  metoCLOPramide (REGLAN) injection 10 mg (10 mg Intravenous Given 06/10/17 1415)  diphenhydrAMINE (BENADRYL) injection 25 mg (25 mg Intravenous Given 06/10/17 1416)  fentaNYL (SUBLIMAZE) injection 50 mcg (50 mcg Intravenous Given 06/10/17 1545)     Initial Impression / Assessment and Plan / ED Course  I have reviewed the triage vital signs and the nursing notes.  Pertinent labs & imaging results that were available during my care of the patient were reviewed by me and considered in my medical decision making (see chart for details).    Pt HA treated and improved while in ED.  Presentation is like pts typical HA and non concerning for [facility], [facility], Meningitis, or temporal arteritis. Pt is afebrile with no focal neuro deficits, nuchal rigidity, or change in vision. Pt is to follow up with PCP to discuss prophylactic medication. Pt verbalizes understanding and is agreeable with plan to dc.   Patient discussed with and seen by Dr. Alvino Chapel.  Discussed results, findings, treatment and follow up. Patient advised of return precautions. Patient verbalized understanding and agreed with plan.   Final Clinical Impressions(s) / ED Diagnoses   Final diagnoses:  Bad headache    New Prescriptions This SmartLink is deprecated. Use AVSMEDLIST instead to display the medication list for a patient.   Robinson, [place] N, PA-C 06/10/17  1605    Davonna Belling, MD 06/10/17 1655

## 2017-06-10 NOTE — ED Triage Notes (Signed)
Pt woke this am around 4 with headache top of head and back, with neck pain, Has seen her doctor. About 3 weeks ago was told she will be scheduled for CT due to headaches but they have not scheduled test.

## 2017-06-28 ENCOUNTER — Other Ambulatory Visit: Payer: Self-pay | Admitting: Cardiovascular Disease

## 2017-07-02 NOTE — Telephone Encounter (Signed)
REFILL 

## 2017-07-03 ENCOUNTER — Other Ambulatory Visit: Payer: Self-pay | Admitting: Internal Medicine

## 2017-07-13 ENCOUNTER — Telehealth: Payer: Self-pay | Admitting: Internal Medicine

## 2017-07-13 NOTE — Telephone Encounter (Signed)
Pt is aware and she is already on 30 units so increasing to 35 per patient

## 2017-07-13 NOTE — Telephone Encounter (Signed)
Please increase Lantus from 25 to 30 units. May need to increase further if sugars not better after the weekend.

## 2017-07-13 NOTE — Telephone Encounter (Signed)
Wednesday AM her reading was 160 and that night it was 197.  Thursday AM was 201 and that night was 196.   Today at 1pm 275, and she has only checked it once today.  She is concerned with these reading and would like advice

## 2017-07-13 NOTE — Telephone Encounter (Signed)
Patient's sugar has been running from about 200 to 275 for about 3 weeks. Patient is concerned.  Please call patient at [phone] to discuss options on how she may be able to bring down the numbers.

## 2017-08-01 ENCOUNTER — Other Ambulatory Visit: Payer: Self-pay | Admitting: Cardiovascular Disease

## 2017-08-24 ENCOUNTER — Encounter: Payer: Self-pay | Admitting: Internal Medicine

## 2017-08-24 ENCOUNTER — Ambulatory Visit (INDEPENDENT_AMBULATORY_CARE_PROVIDER_SITE_OTHER): Payer: Medicare HMO | Admitting: Internal Medicine

## 2017-08-24 VITALS — BP 110/80 | HR 81 | Ht 66.0 in | Wt 193.8 lb

## 2017-08-24 DIAGNOSIS — E1159 Type 2 diabetes mellitus with other circulatory complications: Secondary | ICD-10-CM

## 2017-08-24 DIAGNOSIS — E1165 Type 2 diabetes mellitus with hyperglycemia: Secondary | ICD-10-CM

## 2017-08-24 DIAGNOSIS — E785 Hyperlipidemia, unspecified: Secondary | ICD-10-CM | POA: Diagnosis not present

## 2017-08-24 LAB — POCT GLYCOSYLATED HEMOGLOBIN (HGB A1C): Hemoglobin A1C: 8.1

## 2017-08-24 NOTE — Addendum Note (Signed)
Addended by: Yolande JollyLAWSON, Deania Siguenza on: 08/24/2017 11:56 AM   Modules accepted: Orders

## 2017-08-24 NOTE — Patient Instructions (Addendum)
Please continue: - Levemir 45 units at bedtime - Glipizide ER 5 mg before b'fast  Please try to increase: - Victoza 1.2 mg in am (try to increase further to 1.8 mg)  If you get nausea from Victoza and have to back off to 0.6 mg daily, try to increase Glipizide XL to 10 mg before b'fast.  When sugars improve, try to decrease Levemir again.  Please return in 4 months with your sugar log.

## 2017-08-24 NOTE — Progress Notes (Signed)
Patient ID: Bonnie Gordon, female   DOB: 06-06-1956, 62 y.o.   MRN: [id number]   HPI: Bonnie Gordon is a 62 y.o.-year-old female, returning for follow-up for DM2, dx in 201285-, insulin-dependent, uncontrolled, with complications (CAD, PN). Last visit 4 months ago.  Since last visit >> she restarted exercise, but she hurt her back so she could not continue >>  joined [employer] last week - 30 min workout 3x a week.  Last hemoglobin A1c was: Lab Results  Component Value Date   HGBA1C 6.8 04/25/2017   HGBA1C 6.5 01/18/2017   HGBA1C 6.2 10/18/2016   HGBA1C 7.5 07/20/2016   HGBA1C 9.5 04/20/2016   HGBA1C 8.1 (H) 08/30/2015   HGBA1C 7.1 (H) 02/25/2015   HGBA1C 7.0 (H) 08/26/2014   HGBA1C (H) 09/16/2009    6.5 (NOTE) The ADA recommends the following therapeutic goal for glycemic control related to Hgb A1c measurement: Goal of therapy: <6.5 Hgb A1c  Reference: American Diabetes Association: Clinical Practice Recommendations 2010, Diabetes Care, 2010, 33: (Suppl  1).  01/14/2016: HbA1c 11.3%  Pt is on a regimen of: - Glipizide ER 10 >> 5 Mg before breakfast. - Levemir 60 >> 45 >> 30 >> 25 >> 30 units at bedtime - Victoza 1.2 >> 0.6 mg daily in am (decreased b/c nausea) >> 0.6-1.2 mg daily now  She was also on: - Glipizide 5 mg before dinner >> stopped  - Glimepiride 4 mg before b'fast/ Glipizide XL 10 mg in am  - Tradjenta 5 mg in am  These were stopped when she started insulin. She was on Metformin >> diarrhea.  Pt checks her sugars 2x a day - better in last mo: - am: 86-118, 123 >> 69, 82-115, 135 >> 83-105 >> 102, 126-132 - 2h after b'fast: 127 >> n/c >> 126 >> n/c - before lunch: 100, 120, 153 >> n/c - 2h after lunch: n/c - before dinner: 98, 205, 273 >> n/c >> 68, 113 >> 80-120 >> n/c - 2h after dinner: n/c >> 196 x1 - bedtime: n/c >> 93-149, 156 (ate late) >> 83, 96-100 >> 140-152 - nighttime: n/c Lowest sugar was 69 >> 64 >> 102; unclear if she has hypoglycemia  awareness. Highest sugar was 156 >> 200s >> 300 x1.  Glucometer: AccuChek [employer]  - No CKD, last BUN/creatinine reviewed:  Lab Results  Component Value Date   BUN 15 10/23/2016   BUN 7 05/26/2016   CREATININE 0.81 10/23/2016   CREATININE 0.65 05/26/2016  01/13/2016: 12/0.83, glucose 472  -+ HL; last set of lipids reviewed: Lab Results  Component Value Date   CHOL 141 02/29/2016   HDL 67 02/29/2016   LDLCALC 55 02/29/2016   TRIG 94 02/29/2016   CHOLHDL 2.1 02/29/2016  01/13/2016: 147/218/73/69 On Crestor 40. - last eye exam was in 12/2016: No DR reportedly-small cataract - + Numbness and tingling in her feet. Sees podiatry. On ASA 81.  Last TSH normal: 01/13/2016: TSH 1.39 Lab Results  Component Value Date   TSH 0.835 08/30/2015   She was admitted with CP 05/2016 (found to have hypokalemia, mm cramp)  ROS: Constitutional: + weight gain/no weight loss, no fatigue, no subjective hyperthermia, no subjective hypothermia Eyes: + Blurry vision, no xerophthalmia ENT: no sore throat, no nodules palpated in throat, no dysphagia, no odynophagia, no hoarseness Cardiovascular: no CP/+ SOB/no palpitations/no leg swelling Respiratory: no cough/+ SOB/no wheezing Gastrointestinal: no N/no V/no D/no C/no acid reflux Musculoskeletal: no muscle aches/+ joint aches Skin: no rashes, no hair loss  Neurological: no tremors/+ numbness/+ tingling/no dizziness  I reviewed pt's medications, allergies, PMH, social hx, family hx, and changes were documented in the history of present illness. Otherwise, unchanged from my initial visit note.  Past Medical History:  Diagnosis Date  . Anxiety   . Bipolar disorder ([place])   . Chest pain    a. 01/2003 MV w/ ? ant-inflat ischemia-->Cath: LAD 20p, otw nonobs dzs-->Med Rx; b. 2009 Cath: LAD 30, LCX nl/nondominant, RCA 30, nl EF; c. 08/2014 MV: no ischemia/infarct.  . Complication of anesthesia    pt. states she is difficult to wake up  . Depression   .  Diabetes mellitus    Type II  . Diabetic neuropathy ([place])   . Diverticulitis   . GERD (gastroesophageal reflux disease)   . Headache   . History of bronchitis   . Hypercholesteremia   . Hypertension   . Hypokalemia   . PONV (postoperative nausea and vomiting)   . Shortness of breath dyspnea   . Sleep apnea   . Vertigo    patient reported   Past Surgical History:  Procedure Laterality Date  . ABDOMINAL HYSTERECTOMY    . APPENDECTOMY    . CARDIAC CATHETERIZATION  2009   normal cath  . CARDIAC CATHETERIZATION  2004   normal cath  . CHOLECYSTECTOMY    . TONSILLECTOMY    . TONSILLECTOMY/ADENOIDECTOMY/TURBINATE REDUCTION Bilateral 03/01/2015   Procedure: TONSILLECTOMY/TURBINATE REDUCTION;  Surgeon: Rozetta Nunnery, MD;  Location: [place];  Service: ENT;  Laterality: Bilateral;  . UVULOPALATOPHARYNGOPLASTY Bilateral 03/01/2015   Procedure: UVULOPALATOPHARYNGOPLASTY (UPPP);  Surgeon: Rozetta Nunnery, MD;  Location: [facility];  Service: ENT;  Laterality: Bilateral;  . WISDOM TOOTH EXTRACTION     Social History   Social History  . Marital status: Divorced    Spouse name: N/A  . Number of children: 2   Occupational History  . Disability    Social History Main Topics  . Smoking status: Former Smoker    Quit date: 08/07/1982  . Smokeless tobacco: Never Used  . Alcohol use Yes     Comment: Drinks during the weekends 1 beer or 1 drink of liquor per day   . Drug use: No  . Sexual activity: Yes    Partners: Male    Birth control/ protection: Surgical   Current Outpatient Medications on File Prior to Visit  Medication Sig Dispense Refill  . amLODipine (NORVASC) 5 MG tablet TAKE 1 TABLET (5 MG TOTAL) BY MOUTH DAILY. 90 tablet 3  . aspirin EC 81 MG tablet Take 81 mg by mouth daily.    . baclofen (LIORESAL) 10 MG tablet     . Blood Glucose Monitoring Suppl (ACCU-CHEK NANO SMARTVIEW) w/Device KIT USE TO CHECK SUGAR DAILY. 1 kit 0  . celecoxib (CELEBREX) 200 MG capsule Take 200 mg  by mouth daily as needed.     . fluticasone (FLONASE) 50 MCG/ACT nasal spray Place 2 sprays into both nostrils daily. 16 g 0  . glipiZIDE (GLUCOTROL XL) 5 MG 24 hr tablet Take 1 tablet (5 mg total) by mouth daily with breakfast. 90 tablet 3  . glucose blood (ACCU-CHEK SMARTVIEW) test strip Use as instructed to check sugar 2 times daily 200 each 5  . hydrOXYzine (VISTARIL) 50 MG capsule     . Insulin Detemir (LEVEMIR FLEXTOUCH) 100 UNIT/ML Pen INJECT 25 UNITS INTO THE SKIN EVERY DAY AT bedtime 45 pen 1  . Insulin Pen Needle 32G X 4 MM MISC Use daily  with insulin pen. 100 each 5  . LEVEMIR FLEXTOUCH 100 UNIT/ML Pen INJECT 60 UNITS INTO THE SKIN EVERY DAY AT 10 PM 45 pen 0  . liraglutide 18 MG/3ML SOPN Inject 0.1-0.2 mLs (0.6-1.2 mg total) into the skin daily. 6 pen 3  . metoCLOPramide (REGLAN) 10 MG tablet     . metoprolol succinate (TOPROL-XL) 25 MG 24 hr tablet TAKE 1 TABLET BY MOUTH DAILY. PLEASE SCHEDULE APPOINTMENT FOR REFILLS 30 tablet 1  . naproxen (NAPROSYN) 500 MG tablet     . omeprazole (PRILOSEC) 20 MG capsule Take 20 mg by mouth daily.    . rosuvastatin (CRESTOR) 40 MG tablet TAKE 1 TABLET BY MOUTH EVERY DAY 90 tablet 3  . spironolactone (ALDACTONE) 25 MG tablet Take 25 mg by mouth daily.    . valACYclovir (VALTREX) 500 MG tablet Take 500 mg by mouth daily.    . VENTOLIN HFA 108 (90 BASE) MCG/ACT inhaler Inhale 2 puffs into the lungs every 4 (four) hours as needed for wheezing or shortness of breath.   0   No current facility-administered medications on file prior to visit.    Allergies  Allergen Reactions  . Other Other (See Comments)    No Blood Products, personal preference  . Latex Itching and Rash   Family History  Problem Relation Age of Onset  . Diabetes Mother   . Gout Mother   . Hypertension Mother   . Arthritis Mother   . Heart disease Father   . Heart failure Sister   . Arthritis Sister   . Heart disease Maternal Grandmother   . Diabetes Maternal Grandmother    . Heart disease Paternal Grandmother   . Diabetes Paternal Grandmother   . Diabetes Sister   . Bursitis Sister   . Crohn's disease Sister    PE: BP 110/80   Pulse 81   Ht _0  (1.676 m)   Wt 193 lb 12.8 oz (87.9 kg)   SpO2 96%   BMI 31.28 kg/m  Wt Readings from Last 3 Encounters:  08/24/17 193 lb 12.8 oz (87.9 kg)  06/10/17 184 lb (83.5 kg)  04/25/17 188 lb (85.3 kg)   Constitutional: overweight, in NAD Eyes: PERRLA, EOMI, no exophthalmos ENT: moist mucous membranes, no thyromegaly, no cervical lymphadenopathy Cardiovascular: RRR, No MRG Respiratory: CTA B Gastrointestinal: abdomen soft, NT, ND, BS+ Musculoskeletal: no deformities, strength intact in all 4 Skin: moist, warm, no rashes Neurological: no tremor with outstretched hands, DTR normal in all 4  ASSESSMENT: 1. DM2, insulin-dependent, now more controlled, with complications - CAD - PN  2. HL  PLAN:  1. Patient with long-standing, uncontrolled diabetes, with better control in the last year.  Her sugars were higher during the holidays so we increased her basal insulin back to 30 units since last visit.  - At this visit, sugars are higher,  but improved in the last month after she started to exercise. She mentions that during the holidays, she relaxed her diet, and added meat back.  However, she was feeling better when she was off meat so she will restart a more plant-based diet. She would also like to continue with exercise. - As sugars are all high, I advised her to try to increase Victoza but if not tolerated, to increase glipizide.  When sugars improved, we can try to decrease Levemir again. - I suggested to:  Patient Instructions  Please continue: - Levemir 45 units at bedtime - Glipizide ER 5 mg before b'fast  Please  try to increase: - Victoza 1.2 mg in am (try to increase further to 1.8 mg)  If you get nausea from Victoza and have to back off to 0.6 mg daily, try to increase Glipizide XL to 10 mg before  b'fast.  When sugars improve, try to decrease Levemir again.  Please return in 4 months with your sugar log.   - today, HbA1c is 8.1% (higher) - continue checking sugars at different times of the day - check 1x a day, rotating checks - advised for yearly eye exams >> she is UTD - Return to clinic in 4 mo with sugar log   2. HL - Latest  lipid panel reviewed from 2017: Excellent.   - she had a new Lipid panel recently >> will need to get the results - Continues on Crestor without side effects.  Philemon Kingdom, MD PhD [facility] Endocrinology

## 2017-09-03 ENCOUNTER — Other Ambulatory Visit: Payer: Self-pay

## 2017-09-03 MED ORDER — METOPROLOL SUCCINATE ER 25 MG PO TB24: ORAL_TABLET | ORAL | 0 refills | Status: DC

## 2017-09-17 ENCOUNTER — Other Ambulatory Visit: Payer: Self-pay

## 2017-09-17 MED ORDER — LIRAGLUTIDE 18 MG/3ML ~~LOC~~ SOPN: 1.8000 mg | PEN_INJECTOR | Freq: Every day | SUBCUTANEOUS | 1 refills | Status: DC

## 2017-09-26 ENCOUNTER — Other Ambulatory Visit: Payer: Self-pay | Admitting: Gastroenterology

## 2017-09-26 DIAGNOSIS — R1031 Right lower quadrant pain: Secondary | ICD-10-CM

## 2017-09-26 DIAGNOSIS — R1011 Right upper quadrant pain: Secondary | ICD-10-CM

## 2017-09-30 ENCOUNTER — Other Ambulatory Visit: Payer: Self-pay | Admitting: Cardiovascular Disease

## 2017-10-01 ENCOUNTER — Other Ambulatory Visit: Payer: Self-pay | Admitting: Cardiovascular Disease

## 2017-10-01 ENCOUNTER — Other Ambulatory Visit: Payer: Self-pay | Admitting: Internal Medicine

## 2017-10-01 NOTE — Telephone Encounter (Signed)
REFILL 

## 2017-10-06 ENCOUNTER — Ambulatory Visit
Admission: RE | Admit: 2017-10-06 | Discharge: 2017-10-06 | Disposition: A | Discharge status: Home / Self Care | Payer: Medicare HMO | Source: Ambulatory Visit | Attending: Gastroenterology | Admitting: Gastroenterology

## 2017-10-06 ENCOUNTER — Telehealth: Payer: Self-pay | Admitting: Gastroenterology

## 2017-10-06 DIAGNOSIS — R1011 Right upper quadrant pain: Secondary | ICD-10-CM

## 2017-10-06 DIAGNOSIS — R1031 Right lower quadrant pain: Secondary | ICD-10-CM

## 2017-10-06 MED ORDER — IOPAMIDOL (ISOVUE-300) INJECTION 61%
100.0000 mL | Freq: Once | INTRAVENOUS | Status: AC | PRN
  Administered 2017-10-06: 100 mL via INTRAVENOUS

## 2017-10-06 MED ORDER — METRONIDAZOLE 500 MG PO TABS: 500.0000 mg | ORAL_TABLET | Freq: Two times a day (BID) | ORAL | 0 refills | Status: DC

## 2017-10-06 MED ORDER — CIPROFLOXACIN HCL 500 MG PO TABS: 500.0000 mg | ORAL_TABLET | Freq: Two times a day (BID) | ORAL | 0 refills | Status: DC

## 2017-10-06 NOTE — Telephone Encounter (Signed)
Called patient to inform results of CT abd & pelvis, radiology paged me (Oncall coverage for Dr Elnoria HowardHung) with findings of mild acute sigmoid diverticulitis. Didn't reach patient, left a message , informing results and that I will be antibiotics to pharmacy and call back with questions.

## 2017-11-06 ENCOUNTER — Other Ambulatory Visit (HOSPITAL_BASED_OUTPATIENT_CLINIC_OR_DEPARTMENT_OTHER): Payer: Self-pay | Admitting: Family Medicine

## 2017-11-06 DIAGNOSIS — Z1231 Encounter for screening mammogram for malignant neoplasm of breast: Secondary | ICD-10-CM

## 2017-11-21 ENCOUNTER — Telehealth: Payer: Self-pay | Admitting: Cardiovascular Disease

## 2017-11-21 NOTE — Telephone Encounter (Signed)
Spoke to patient, patient states she was given a CPAP machine and has been using this.   She wants a new machine and was told she needed a new sleep study.   She has been followed in [place] but wants Dr. Tresa EndoKelly to follow her for sleep as well.   Advised she is due for follow up, will have scheduler call to schedule and this can be discussed at that time.  Advised to bring CPAP machine with her to appt.

## 2017-11-21 NOTE — Telephone Encounter (Signed)
Follow Up:; ° ° °Returning your call. °

## 2017-11-21 NOTE — Telephone Encounter (Signed)
Left message to call back  Last OV 09/2016, overdue for follow up. Will need OV to discuss

## 2017-11-21 NOTE — Telephone Encounter (Signed)
This will need to be referred to the patient's MD

## 2017-11-21 NOTE — Telephone Encounter (Signed)
New Message:     Pt is requesting a sleep study to be done. Pt would like to know if she needs a appt first.

## 2017-11-25 ENCOUNTER — Other Ambulatory Visit: Payer: Self-pay | Admitting: Cardiovascular Disease

## 2017-11-26 ENCOUNTER — Ambulatory Visit (HOSPITAL_BASED_OUTPATIENT_CLINIC_OR_DEPARTMENT_OTHER)
Admission: RE | Admit: 2017-11-26 | Discharge: 2017-11-26 | Disposition: A | Discharge status: Home / Self Care | Payer: Medicare HMO | Source: Ambulatory Visit | Attending: Family Medicine | Admitting: Family Medicine

## 2017-11-26 DIAGNOSIS — Z1231 Encounter for screening mammogram for malignant neoplasm of breast: Secondary | ICD-10-CM | POA: Diagnosis present

## 2017-12-17 ENCOUNTER — Other Ambulatory Visit: Payer: Self-pay | Admitting: Internal Medicine

## 2017-12-21 ENCOUNTER — Ambulatory Visit: Payer: Medicare HMO | Admitting: Internal Medicine

## 2018-01-13 ENCOUNTER — Other Ambulatory Visit: Payer: Self-pay | Admitting: Internal Medicine

## 2018-01-13 ENCOUNTER — Other Ambulatory Visit: Payer: Self-pay | Admitting: Cardiovascular Disease

## 2018-02-11 ENCOUNTER — Other Ambulatory Visit: Payer: Self-pay | Admitting: Internal Medicine

## 2018-02-18 ENCOUNTER — Encounter: Payer: Self-pay | Admitting: Cardiovascular Disease

## 2018-02-18 ENCOUNTER — Ambulatory Visit (INDEPENDENT_AMBULATORY_CARE_PROVIDER_SITE_OTHER): Payer: Medicare HMO | Admitting: Cardiovascular Disease

## 2018-02-18 VITALS — BP 122/88 | HR 78 | Ht 66.0 in | Wt 192.6 lb

## 2018-02-18 DIAGNOSIS — E119 Type 2 diabetes mellitus without complications: Secondary | ICD-10-CM

## 2018-02-18 DIAGNOSIS — Z794 Long term (current) use of insulin: Secondary | ICD-10-CM

## 2018-02-18 DIAGNOSIS — E785 Hyperlipidemia, unspecified: Secondary | ICD-10-CM | POA: Diagnosis not present

## 2018-02-18 DIAGNOSIS — E669 Obesity, unspecified: Secondary | ICD-10-CM | POA: Diagnosis not present

## 2018-02-18 DIAGNOSIS — G4730 Sleep apnea, unspecified: Secondary | ICD-10-CM | POA: Diagnosis not present

## 2018-02-18 DIAGNOSIS — I10 Essential (primary) hypertension: Secondary | ICD-10-CM | POA: Diagnosis not present

## 2018-02-18 MED ORDER — ROSUVASTATIN CALCIUM 40 MG PO TABS: 20.0000 mg | ORAL_TABLET | Freq: Every day | ORAL | 0 refills | Status: DC

## 2018-02-18 NOTE — Progress Notes (Signed)
Patient ID: Bonnie Gordon, female   DOB: March 13, 1956, 62 y.o.   MRN: [id number]    Primary MD:  Dr. Loma Sousa  HPI: Bonnie Gordon is a 62 y.o. female who presents for a 17 month  follow-up evaluation.   I had seen Bonnie Gordon initially in 2004 after she had experienced chest pressure and was evaluated at [facility] emergency room.  A nuclear perfusion study raised the possibility of anterior to inferolateral wall ischemia.  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  She was treated aggressively with statin therapy in attempt to induce plaque or aggression.  She had a second heart catheterization which was done by Dr. Gwenlyn Found in 2009 which showed 30% LAD and RCA stenoses with a normal nondominant circumflex and with continued normal LV function.  She developed diabetes mellitus over the last 4 years.  She is on medication for hypertension as well as GERD, hyperlipidemia, asthma and peripheral neuropathy.  She also was diagnosed with sleep apnea and is using CPAP therapy.  When I saw her one year ago she had developed episodes of chest discomfort.  Oftentimes these are sharp and short-lived.   A 2-D echo Doppler study on 08/12/2014 showed normal systolic function without regional wall motion abnormalities or abnormal valvular architecture.  A nuclear perfusion study revealed an ejection fraction of 58% with normal perfusion and function.  There were no ECG changes on the study.  She underwent laboratory which demonstrated a hemoglobin of 13.9, hematocrit 41.3.  Electrolytes were normal, although glucose was mildly elevated at 1:15.  She had normal liver function studies with the exception of minimal alkaline phosphatase elevation at 136.  Her lipids were excellent with a total cholesterol 151, triglycerides 97, HDL 52, LDL 80.   Hemoglobin A1c was increased at 7.0.  She presents now for follow-up evaluation.  When I last saw her in January 2017, she had stopped taking Crestor. She was inactive and not exercising.  He had noted some mild leg swelling.  She was taking amlodipine for hypertension.  At that time she was on CPAP therapy.  Last saw her in February 2018 at which time she was not using CPAP and needed a humidifier.  She had experienced episodes of atypical chest pain described as a shooting pain on the right side of her chest.  Oftentimes her pain occurs while she is sitting and is nonexertional.  She underwent a nuclear perfusion study in August 2017, which was low risk and showed normal perfusion.  EF was 49% without wall motion abnormalities.  She tells me she underwent rotator cuff surgery.    Since I last saw her, she [facility] Michela Pitcher.  Apparently on that study she was not found to have significant overall sleep apnea with an AHI of 2.9.  There was very mild sleep apnea with supine position at 5.1 and during rem sleep at 7.2/h.  ~, She has not been on CPAP therapy but it was recommended that if the patient gains weight and becomes more sleepy repeat study may be necessary.  Epworth Sleepiness Scale score was endorsed today which was elevated as shown below;   Epworth Sleepiness Scale: Situation   Chance of Dozing/Sleeping (0 = never , 1 = slight chance , 2 = moderate chance , 3 =  high chance )   sitting and reading 3   watching TV 3   sitting inactive in a public place 3   being a passenger in a motor vehicle for an hour or more 2   lying down in the afternoon 2   sitting and talking to someone 0   sitting quietly after lunch (no alcohol) 1   while stopped for a few minutes in traffic as the driver 1   Total Score  15   He has been followed by Dr. Loma Sousa.  Apparently she was having significant fatigue and her blood pressure became low and  as result she has not been taking her blood pressure medications which included amlodipine 5 mg, Toprol-XL 25 mg, and Spironolactone.  Recently she does admit to some shortness of breath.  She also has not been taking her lipid-lowering therapy.  She presents for evaluation.   Past Medical History:  Diagnosis Date  . Anxiety   . Bipolar disorder ([place])   . Chest pain    a. 01/2003 MV w/ ? ant-inflat ischemia-->Cath: LAD 20p, otw nonobs dzs-->Med Rx; b. 2009 Cath: LAD 30, LCX nl/nondominant, RCA 30, nl EF; c. 08/2014 MV: no ischemia/infarct.  . Complication of anesthesia    pt. states she is difficult to wake up  . Depression   . Diabetes mellitus    Type II  . Diabetic neuropathy ([place])   . Diverticulitis   . GERD (gastroesophageal reflux disease)   . Headache   . History of bronchitis   . Hypercholesteremia   . Hypertension   . Hypokalemia   . PONV (postoperative nausea and vomiting)   . Shortness of breath dyspnea   . Sleep apnea   . Vertigo    patient reported    Past Surgical History:  Procedure Laterality Date  . ABDOMINAL HYSTERECTOMY    . APPENDECTOMY    . CARDIAC CATHETERIZATION  2009   normal cath  . CARDIAC CATHETERIZATION  2004   normal cath  . CHOLECYSTECTOMY    . TONSILLECTOMY    . TONSILLECTOMY/ADENOIDECTOMY/TURBINATE REDUCTION Bilateral 03/01/2015   Procedure: TONSILLECTOMY/TURBINATE REDUCTION;  Surgeon: Rozetta Nunnery, MD;  Location: [place];  Service: ENT;  Laterality: Bilateral;  . UVULOPALATOPHARYNGOPLASTY Bilateral 03/01/2015   Procedure: UVULOPALATOPHARYNGOPLASTY (UPPP);  Surgeon: Rozetta Nunnery, MD;  Location: [place];  Service: ENT;  Laterality: Bilateral;  . WISDOM TOOTH EXTRACTION      Allergies  Allergen Reactions  . Other Other (See Comments)    No Blood Products, personal preference  . Latex Itching and Rash    Current Outpatient Medications  Medication Sig Dispense Refill  . aspirin EC 81 MG tablet Take 81 mg by mouth daily.    .  baclofen (LIORESAL) 10 MG tablet     . Blood Glucose Monitoring Suppl (ACCU-CHEK NANO SMARTVIEW) w/Device KIT USE TO CHECK SUGAR DAILY. 1 kit 0  . celecoxib (CELEBREX) 200 MG capsule Take 200 mg by mouth daily as needed.     Marland Kitchen glipiZIDE (GLUCOTROL XL) 5 MG 24 hr tablet Take 1 tablet (5 mg total) by mouth daily with breakfast. 90 tablet 3  . glucose blood (ACCU-CHEK SMARTVIEW) test strip Use as instructed to check sugar 2 times daily 200 each 5  . Insulin Detemir (LEVEMIR FLEXTOUCH) 100 UNIT/ML Pen INJECT 25 UNITS INTO THE SKIN EVERY DAY AT bedtime 45 pen 1  . Insulin Pen Needle (BD PEN NEEDLE NANO U/F) 32G X 4 MM MISC USE EVERY DAY WITH  DIABETIC INJECTABLES 100 each 2  . LEVEMIR FLEXTOUCH 100 UNIT/ML Pen INJECT 60 UNITS INTO THE SKIN EVERY DAY AT 10 PM 45 pen 0  . liraglutide (VICTOZA) 18 MG/3ML SOPN INJECT 0.3 MLS (1.8 MG TOTAL) INTO THE SKIN DAILY. NEED APPOINTMENT 27 pen 0  . metroNIDAZOLE (FLAGYL) 500 MG tablet Take 1 tablet (500 mg total) by mouth 2 (two) times daily. 14 tablet 0  . naproxen (NAPROSYN) 500 MG tablet     . omeprazole (PRILOSEC) 20 MG capsule Take 20 mg by mouth daily.    . rosuvastatin (CRESTOR) 40 MG tablet Take 0.5 tablets (20 mg total) by mouth daily. 90 tablet 0  . spironolactone (ALDACTONE) 25 MG tablet Take 12.5 mg by mouth daily.    . valACYclovir (VALTREX) 500 MG tablet Take 500 mg by mouth daily.     No current facility-administered medications for this visit.     Socially she is divorced.  She quit tobacco use in 1986.  She does drink occasional alcohol.  She does not routinely exercise.  Family History  Problem Relation Age of Onset  . Diabetes Mother   . Gout Mother   . Hypertension Mother   . Arthritis Mother   . Heart disease Father   . Heart failure Sister   . Arthritis Sister   . Heart disease Maternal Grandmother   . Diabetes Maternal Grandmother   . Heart disease Paternal Grandmother   . Diabetes Paternal Grandmother   . Diabetes Sister   .  Bursitis Sister   . Crohn's disease Sister    Family history is notable that her mother is alive at 47 and has diabetes mellitus, gout, and hypertension.  Father died at age 32 with heart failure and had a pacemaker.  One brother also has a pacemaker.  One sister has congestive heart failure.  Another sister has diabetes mellitus.  Another sister has Crohn's disease.  ROS General: Negative; No fevers, chills, or night sweats HEENT: Negative; No changes in vision or hearing, sinus congestion, difficulty swallowing Pulmonary: Negative; No cough, wheezing, shortness of breath, hemoptysis Cardiovascular:  See HPI; No anginal chest pain, presyncope, syncope, palpitations, edema GI: Positive for GERD on omeprazole; No nausea, vomiting, diarrhea, or abdominal pain GU: Negative; No dysuria, hematuria, or difficulty voiding Musculoskeletal: Negative; no myalgias, joint pain, or weakness Hematologic/Oncologic: Negative; no easy bruising, bleeding Endocrine: Negative; no heat/cold intolerance; no diabetes Neuro: Positive for peripheral neuropathy on gabapentin Skin: Negative; No rashes or skin lesions Psychiatric: Negative; No behavioral problems, depression Sleep: Positive for obstructive sleep apnea not using CPAP therapy with mild daytime sleepiness, hypersomnolence; no bruxism, restless legs, hypnogagnic hallucinations Other comprehensive 14 point system review is negative   Physical Exam BP 122/88   Pulse 78   Ht 5' 6"  (1.676 m)   Wt 192 lb 9.6 oz (87.4 kg)   BMI 31.09 kg/m    Repeat blood pressure by me was 120/78  Wt Readings from Last 3 Encounters:  02/18/18 192 lb 9.6 oz (87.4 kg)  08/24/17 193 lb 12.8 oz (87.9 kg)  06/10/17 184 lb (83.5 kg)   General: Alert, oriented, no distress.  Skin: normal turgor, no rashes, warm and dry HEENT: Normocephalic, atraumatic. Pupils equal round and reactive to light; sclera anicteric; extraocular muscles intact; Fundi without hemorrhages or  exudates. Nose without nasal septal hypertrophy Mouth/Parynx benign; Mallinpatti scale 3 Neck: No JVD, no carotid bruits; normal carotid upstroke Lungs: clear to ausculatation and percussion; no wheezing or rales Chest wall:  without tenderness to palpitation Heart: PMI not displaced, RRR, s1 s2 normal, 1/6 systolic murmur, no diastolic murmur, no rubs, gallops, thrills, or heaves Abdomen: soft, nontender; no hepatosplenomehaly, BS+; abdominal aorta nontender and not dilated by palpation. Back: no CVA tenderness Pulses 2+ Musculoskeletal: full range of motion, normal strength, no joint deformities Extremities: Trace edema; no clubbing, cyanosis, Homan's sign negative  Neurologic: grossly nonfocal; Cranial nerves grossly wnl Psychologic: Normal mood and affect   ECG (independently read by me): Sinus rhythm at 78 bpm.  Right bundle branch block with repolarization changes.  No ectopy.  QTc interval 453 ms  February 2018 ECG (independently read by me): Normal sinus rhythm at 81 bpm.  QRS complex V1 and V2.  Normal intervals.  January 2017 ECG (independently read by me):  Normal sinus rhythm with a PAC.  QTc interval 422  January 2016 ECG (independently read by me): Normal sinus rhythm at 77 bpm.  QRS complex V1 V2; nondiagnostic T changes anterolaterally.  QTc interval 454 ms  LABS:  BMP Latest Ref Rng & Units 10/23/2016 05/26/2016 05/25/2016  Glucose 65 - 99 mg/dL 117(H) 129(H) 77  BUN 6 - 20 mg/dL 15 7 6   Creatinine 0.44 - 1.00 mg/dL 0.81 0.65 0.65  Sodium 135 - 145 mmol/L 142 139 141  Potassium 3.5 - 5.1 mmol/L 4.6 3.7 3.3(L)  Chloride 101 - 111 mmol/L 105 105 105  CO2 22 - 32 mmol/L 30 28 27   Calcium 8.9 - 10.3 mg/dL 10.0 8.7(L) 9.1   Hepatic Function Latest Ref Rng & Units 10/23/2016 05/25/2016 02/29/2016  Total Protein 6.5 - 8.1 g/dL 8.7(H) 7.1 7.2  Albumin 3.5 - 5.0 g/dL 4.5 3.8 4.0  AST 15 - 41 U/L 17 31 16   ALT 14 - 54 U/L 22 40 20  Alk Phosphatase 38 - 126 U/L 102 106 99    Total Bilirubin 0.3 - 1.2 mg/dL 0.6 0.3 0.4  Bilirubin, Direct <=0.2 mg/dL - - 0.1   CBC Latest Ref Rng & Units 10/23/2016 05/25/2016 01/15/2016  WBC 4.0 - 10.5 K/uL 7.3 6.5 5.3  Hemoglobin 12.0 - 15.0 g/dL 14.7 14.6 13.6  Hematocrit 36.0 - 46.0 % 43.4 43.4 40.4  Platelets 150 - 400 K/uL 224 171 185   Lab Results  Component Value Date   MCV 83.0 10/23/2016   MCV 82.8 05/25/2016   MCV 81.5 01/15/2016   Lab Results  Component Value Date   TSH 0.835 08/30/2015   Lab Results  Component Value Date   HGBA1C 8.1 08/24/2017   Lipid Panel     Component Value Date/Time   CHOL 141 02/29/2016 0856   TRIG 94 02/29/2016 0856   HDL 67 02/29/2016 0856   CHOLHDL 2.1 02/29/2016 0856   VLDL 19 02/29/2016 0856   LDLCALC 55 02/29/2016 0856    RADIOLOGY: No results found.  IMPRESSION: 1. Sleep apnea, unspecified type   2. Essential hypertension   3. Hyperlipidemia LDL goal <70   4. Type 2 diabetes mellitus without complication, with long-term current use of insulin ([place])   5. Mild obesity     ASSESSMENT AND PLAN: Bonnie Gordon is a 62 year old African-American female who has previously been documented have mild CAD at initial catheterization in 2004 and subsequent cardiac catheterization in 2009.  She had developed diabetes mellitus, hypertension, obstructive sleep apnea and peripheral neuropathy.  Since I last saw her, she apparently had undergone a follow-up sleep study in [place] [facility].  This was  done after she had not been utilizing CPAP for several years.  On this study she was not found to have significant overall sleep apnea but there was minimal sleep apnea noted with supine and very mild sleep apnea during rem sleep.  Oxygen desaturated to 83%.  Her Epworth Sleepiness Scale score still indicates that she is sleepy.  We discussed improved sleep hygiene as well as sleep duration.  Apparently she had become fatigued and develop low blood pressure and as  result is no longer taking her antihypertensive medications which had included amlodipine, Toprol-XL, and Spironolactone.  With her complaints of mild shortness of breath and some trace edema I have at least recommended resumption of Spironolactone initially at 12.5 mg.  She had also stopped her lipid-lowering therapy and with her mild CAD I have re-recommended resumption of rosuvastatin.  She is diabetic on Victoza, glipizide, and insulin.  BMI is mildly increased at 31.09 consistent with mild obesity.  Weight loss and increased exercise was recommended.  She will follow-up with Dr. Loma Sousa.  I will see her in 6 months for reevaluation.  Time spent: 25 minutes  Troy Sine, MD, [facility] 02/24/2018 12:08 PM

## 2018-02-18 NOTE — Patient Instructions (Signed)
Medication Instructions:  Resume spironolactone 12.5 mg (1/2 tablet) daily Resume Crestor 20 mg daily (1/2 tablet)  Labwork: At PCP (Dr. Pecola Leisureeese)  Follow-Up: Your physician wants you to follow-up in: 6 months with Dr. Tresa EndoKelly.  You will receive a reminder letter in the mail two months in advance. If you don't receive a letter, please call our office to schedule the follow-up appointment.   Any Other Special Instructions Will Be Listed Below (If Applicable).     If you need a refill on your cardiac medications before your next appointment, please call your pharmacy.

## 2018-02-24 ENCOUNTER — Encounter: Payer: Self-pay | Admitting: Cardiovascular Disease

## 2018-02-25 ENCOUNTER — Other Ambulatory Visit: Payer: Self-pay | Admitting: Internal Medicine

## 2018-03-27 ENCOUNTER — Encounter: Payer: Self-pay | Admitting: Neurology

## 2018-03-27 ENCOUNTER — Ambulatory Visit (INDEPENDENT_AMBULATORY_CARE_PROVIDER_SITE_OTHER): Payer: Medicare HMO | Admitting: Neurology

## 2018-03-27 VITALS — BP 118/76 | HR 85 | Ht 66.0 in | Wt 190.0 lb

## 2018-03-27 DIAGNOSIS — R51 Headache: Secondary | ICD-10-CM | POA: Diagnosis not present

## 2018-03-27 DIAGNOSIS — R519 Headache, unspecified: Secondary | ICD-10-CM | POA: Insufficient documentation

## 2018-03-27 NOTE — Progress Notes (Signed)
PATIENT: Bonnie Gordon DOB: 09/14/1955  Chief Complaint  Patient presents with  . Headache    Reports a dull, achy headache everyday. It is typically located across her forehead.  She does not medicate her pain.    Marland Kitchen PCP    Lin Landsman, MD     HISTORICAL  Bonnie Gordon is a 62 year old female, seen in request by her primary care physician Dr. Ayesha Rumpf, [facility] for evaluation of headaches, initial evaluation was on March 27, 2018.  She had past medical history of hyperlipidemia, diabetes, insulin-dependent, bipolar disorder,  She had a history of migraine headaches in her 61s, her typical migraine as severe pounding headache with associated light noise sensitivity, nauseous, her migraine overall has improved.  Since May 2019, she began to have different headaches, bilateral frontal temporal region pressure headaches, overall woke up in the morning with headaches, she tends not to take any over-the-counter medications, pressure sensation, gradually improved throughout the day, by late afternoon in the evening time her headache was much improved,  She had a sleep study at [facility] in August 2019 and reported there was no evidence of of obstructive sleep apnea,  She is very concerned about her symptoms, her sister had a history of brain tumor, presented with headache at that time, left 3  Laboratory evaluations in 2019 showed mild elevated A1c 7.7, hemoglobin of 14.5, normal TSH 1.14, creatinine 0.81, LDL of 47  REVIEW OF SYSTEMS: Full 14 system review of systems performed and notable only for depression, anxiety, cramps, ringing in ears, birthmarks, shortness of breath, snoring  ALLERGIES: Allergies  Allergen Reactions  . Other Other (See Comments)    No Blood Products, personal preference  . Latex Itching and Rash    HOME MEDICATIONS: Current Outpatient Medications  Medication Sig Dispense Refill  . aspirin EC 81 MG tablet Take 81 mg by mouth daily.    . Blood Glucose  Monitoring Suppl (ACCU-CHEK NANO SMARTVIEW) w/Device KIT USE TO CHECK SUGAR DAILY. 1 kit 0  . celecoxib (CELEBREX) 200 MG capsule Take 200 mg by mouth daily as needed.     Marland Kitchen glipiZIDE (GLUCOTROL XL) 5 MG 24 hr tablet Take 1 tablet (5 mg total) by mouth daily with breakfast. 90 tablet 3  . glucose blood (ACCU-CHEK SMARTVIEW) test strip Use as instructed to check sugar 2 times daily 200 each 5  . Insulin Detemir (LEVEMIR FLEXTOUCH) 100 UNIT/ML Pen Inject 60 Units into the skin at bedtime. NEED APPOINTMENT FOR FURTHER REFILLS 45 pen 0  . Insulin Pen Needle (BD PEN NEEDLE NANO U/F) 32G X 4 MM MISC USE EVERY DAY WITH DIABETIC INJECTABLES 100 each 2  . liraglutide (VICTOZA) 18 MG/3ML SOPN INJECT 0.3 MLS (1.8 MG TOTAL) INTO THE SKIN DAILY. NEED APPOINTMENT 27 pen 0  . rosuvastatin (CRESTOR) 40 MG tablet Take 0.5 tablets (20 mg total) by mouth daily. 90 tablet 0  . spironolactone (ALDACTONE) 25 MG tablet Take 12.5 mg by mouth daily.    . valACYclovir (VALTREX) 500 MG tablet Take 500 mg by mouth daily.     No current facility-administered medications for this visit.     PAST MEDICAL HISTORY: Past Medical History:  Diagnosis Date  . Anxiety   . Bipolar disorder ([place])   . Chest pain    a. 01/2003 MV w/ ? ant-inflat ischemia-->Cath: LAD 20p, otw nonobs dzs-->Med Rx; b. 2009 Cath: LAD 30, LCX nl/nondominant, RCA 30, nl EF; c. 08/2014 MV: no ischemia/infarct.  . Complication of anesthesia  pt. states she is difficult to wake up  . Depression   . Diabetes mellitus    Type II  . Diabetic neuropathy ([place])   . Diverticulitis   . GERD (gastroesophageal reflux disease)   . Headache   . History of bronchitis   . Hypercholesteremia   . Hypertension   . Hypokalemia   . PONV (postoperative nausea and vomiting)   . Shortness of breath dyspnea   . Sleep apnea   . Vertigo    patient reported    PAST SURGICAL HISTORY: Past Surgical History:  Procedure Laterality Date  . ABDOMINAL HYSTERECTOMY    .  APPENDECTOMY    . CARDIAC CATHETERIZATION  2009   normal cath  . CARDIAC CATHETERIZATION  2004   normal cath  . CHOLECYSTECTOMY    . TONSILLECTOMY    . TONSILLECTOMY/ADENOIDECTOMY/TURBINATE REDUCTION Bilateral 03/01/2015   Procedure: TONSILLECTOMY/TURBINATE REDUCTION;  Surgeon: Rozetta Nunnery, MD;  Location: [place];  Service: ENT;  Laterality: Bilateral;  . UVULOPALATOPHARYNGOPLASTY Bilateral 03/01/2015   Procedure: UVULOPALATOPHARYNGOPLASTY (UPPP);  Surgeon: Rozetta Nunnery, MD;  Location: [place];  Service: ENT;  Laterality: Bilateral;  . WISDOM TOOTH EXTRACTION      FAMILY HISTORY: Family History  Problem Relation Age of Onset  . Diabetes Mother   . Gout Mother   . Hypertension Mother   . Arthritis Mother   . Heart disease Father   . Heart failure Sister   . Arthritis Sister   . Heart disease Maternal Grandmother   . Diabetes Maternal Grandmother   . Heart disease Paternal Grandmother   . Diabetes Paternal Grandmother   . Diabetes Sister   . Bursitis Sister   . Crohn's disease Sister     SOCIAL HISTORY: Social History   Socioeconomic History  . Marital status: Divorced    Spouse name: Not on file  . Number of children: Not on file  . Years of education: some college  . Highest education level: Not on file  Occupational History  . Occupation: Retired Proofreader  . Financial resource strain: Not on file  . Food insecurity:    Worry: Not on file    Inability: Not on file  . Transportation needs:    Medical: Not on file    Non-medical: Not on file  Tobacco Use  . Smoking status: Former Smoker    Last attempt to quit: 08/07/1982    Years since quitting: 35.6  . Smokeless tobacco: Never Used  Substance and Sexual Activity  . Alcohol use: Yes    Comment: occasional / social  . Drug use: No  . Sexual activity: Yes    Partners: Male    Birth control/protection: Surgical  Lifestyle  . Physical activity:    Days per week: Not on file    Minutes  per session: Not on file  . Stress: Not on file  Relationships  . Social connections:    Talks on phone: Not on file    Gets together: Not on file    Attends religious service: Not on file    Active member of club or organization: Not on file    Attends meetings of clubs or organizations: Not on file    Relationship status: Not on file  . Intimate partner violence:    Fear of current or ex partner: Not on file    Emotionally abused: Not on file    Physically abused: Not on file    Forced sexual activity: Not  on file  Other Topics Concern  . Not on file  Social History Narrative   Lives at home alone.   Right-handed.    1 cup caffeine per day.     PHYSICAL EXAM   Vitals:   03/27/18 0923  BP: 118/76  Pulse: 85  Weight: 190 lb (86.2 kg)  Height: _0  (1.676 m)    Not recorded      Body mass index is 30.67 kg/m.  PHYSICAL EXAMNIATION:  Gen: NAD, conversant, well nourised, obese, well groomed                     Cardiovascular: Regular rate rhythm, no peripheral edema, warm, nontender. Eyes: Conjunctivae clear without exudates or hemorrhage Neck: Supple, no carotid bruits. Pulmonary: Clear to auscultation bilaterally   NEUROLOGICAL EXAM:  MENTAL STATUS: Speech:    Speech is normal; fluent and spontaneous with normal comprehension.  Cognition:     Orientation to time, place and person     Normal recent and remote memory     Normal Attention span and concentration     Normal Language, naming, repeating,spontaneous speech     Fund of knowledge   CRANIAL NERVES: CN II: Visual fields are full to confrontation. Fundoscopic exam is normal with sharp discs and no vascular changes. Pupils are round equal and briskly reactive to light. CN III, IV, VI: extraocular movement are normal. No ptosis. CN V: Facial sensation is intact to pinprick in all 3 divisions bilaterally. Corneal responses are intact.  CN VII: Face is symmetric with normal eye closure and smile. CN  VIII: Hearing is normal to rubbing fingers CN IX, X: Palate elevates symmetrically. Phonation is normal. CN XI: Head turning and shoulder shrug are intact CN XII: Tongue is midline with normal movements and no atrophy.  MOTOR: There is no pronator drift of out-stretched arms. Muscle bulk and tone are normal. Muscle strength is normal.  REFLEXES: Reflexes are 2+ and symmetric at the biceps, triceps, knees, and ankles. Plantar responses are flexor.  SENSORY: Intact to light touch, pinprick, positional sensation and vibratory sensation are intact in fingers and toes.  COORDINATION: Rapid alternating movements and fine finger movements are intact. There is no dysmetria on finger-to-nose and heel-knee-shin.    GAIT/STANCE: Posture is normal. Gait is steady with normal steps, base, arm swing, and turning. Heel and toe walking are normal. Tandem gait is normal.  Romberg is absent.   DIAGNOSTIC DATA (LABS, IMAGING, TESTING) - I reviewed patient records, labs, notes, testing and imaging myself where available.   ASSESSMENT AND PLAN  Bonnie Gordon is a 63 y.o. female   New onset headaches  Laboratory evaluation ESR C-reactive protein  MRI of the brain to rule out structural lesion  As needed NSAIDs   Marcial Pacas, M.D. Ph.D.  [facility] [address] [place], [place] [address] Ph: [phone] Fax: [phone]  CC: Lin Landsman, MD

## 2018-03-28 ENCOUNTER — Telehealth: Payer: Self-pay | Admitting: *Deleted

## 2018-03-28 ENCOUNTER — Telehealth: Payer: Self-pay | Admitting: Neurology

## 2018-03-28 LAB — C-REACTIVE PROTEIN: CRP: 3 mg/L (ref 0–10)

## 2018-03-28 LAB — SEDIMENTATION RATE: Sed Rate: 8 mm/hr (ref 0–40)

## 2018-03-28 NOTE — Telephone Encounter (Signed)
LVM informing patient she had normal laboratory results. Left number for any questions.

## 2018-03-28 NOTE — Telephone Encounter (Signed)
Left patient a detailed message, with results, on voicemail (ok per DPR).  Provided our number to call back with any questions.  

## 2018-03-28 NOTE — Telephone Encounter (Signed)
-----   Message from Levert FeinsteinYijun Yan, MD sent at 03/28/2018  9:15 AM EDT ----- Please call patient for normal laboratory result

## 2018-03-28 NOTE — Telephone Encounter (Signed)
[employer] pending faxed clinical notes  °

## 2018-04-01 NOTE — Telephone Encounter (Signed)
Bonnie GravenHumana [facility] Bonnie Gordon: [id number] (exp. 03/28/18 to 04/27/18) order sent to GI. They will reach out to the pt to schedule.

## 2018-04-03 ENCOUNTER — Other Ambulatory Visit: Payer: Self-pay | Admitting: Internal Medicine

## 2018-04-03 NOTE — Telephone Encounter (Signed)
Is this okay to refill? 

## 2018-04-03 NOTE — Telephone Encounter (Signed)
Yes we can always refill test strips

## 2018-04-04 ENCOUNTER — Ambulatory Visit: Payer: Medicare HMO | Admitting: Cardiovascular Disease

## 2018-04-07 ENCOUNTER — Other Ambulatory Visit: Payer: Self-pay | Admitting: Cardiovascular Disease

## 2018-04-15 ENCOUNTER — Ambulatory Visit
Admission: RE | Admit: 2018-04-15 | Discharge: 2018-04-15 | Disposition: A | Discharge status: Home / Self Care | Payer: Medicare HMO | Source: Ambulatory Visit | Attending: Neurology | Admitting: Neurology

## 2018-04-15 ENCOUNTER — Other Ambulatory Visit: Payer: Self-pay | Admitting: Cardiovascular Disease

## 2018-04-15 DIAGNOSIS — R51 Headache: Principal | ICD-10-CM

## 2018-04-15 DIAGNOSIS — R519 Headache, unspecified: Secondary | ICD-10-CM

## 2018-04-17 ENCOUNTER — Telehealth: Payer: Self-pay | Admitting: *Deleted

## 2018-04-17 NOTE — Telephone Encounter (Signed)
-----   Message from Levert Feinstein, MD sent at 04/17/2018 12:48 PM EDT ----- Please call pt for normal MRI brain.

## 2018-04-17 NOTE — Telephone Encounter (Signed)
Left patient a detailed message, with results, on voicemail (ok per DPR).  Provided our number to call back with any questions.  

## 2018-05-08 ENCOUNTER — Other Ambulatory Visit: Payer: Self-pay | Admitting: Internal Medicine

## 2018-05-14 ENCOUNTER — Other Ambulatory Visit: Payer: Self-pay | Admitting: Cardiovascular Disease

## 2018-07-15 ENCOUNTER — Encounter: Payer: Self-pay | Admitting: Internal Medicine

## 2018-07-15 ENCOUNTER — Ambulatory Visit (INDEPENDENT_AMBULATORY_CARE_PROVIDER_SITE_OTHER): Payer: Medicare HMO | Admitting: Internal Medicine

## 2018-07-15 VITALS — BP 120/70 | HR 84 | Ht 66.0 in | Wt 193.0 lb

## 2018-07-15 DIAGNOSIS — E66811 Obesity, class 1: Secondary | ICD-10-CM | POA: Insufficient documentation

## 2018-07-15 DIAGNOSIS — E1165 Type 2 diabetes mellitus with hyperglycemia: Secondary | ICD-10-CM

## 2018-07-15 DIAGNOSIS — E669 Obesity, unspecified: Secondary | ICD-10-CM | POA: Diagnosis not present

## 2018-07-15 DIAGNOSIS — E1159 Type 2 diabetes mellitus with other circulatory complications: Secondary | ICD-10-CM

## 2018-07-15 DIAGNOSIS — E785 Hyperlipidemia, unspecified: Secondary | ICD-10-CM | POA: Diagnosis not present

## 2018-07-15 LAB — POCT GLYCOSYLATED HEMOGLOBIN (HGB A1C): Hemoglobin A1C: 7.5 % — AB (ref 4.0–5.6)

## 2018-07-15 NOTE — Progress Notes (Signed)
Patient ID: Bonnie Gordon, female   DOB: 09-06-1955, 62 y.o.   MRN: [id number]   HPI: Bonnie Gordon is a 62 y.o.-year-old female, returning for follow-up for DM2, dx in 201285-, insulin-dependent, uncontrolled, with complications (CAD, PN). Last visit 11 months ago.  Last hemoglobin A1c was: Lab Results  Component Value Date   HGBA1C 8.1 08/24/2017   HGBA1C 6.8 04/25/2017   HGBA1C 6.5 01/18/2017   HGBA1C 6.2 10/18/2016   HGBA1C 7.5 07/20/2016   HGBA1C 9.5 04/20/2016   HGBA1C 8.1 (H) 08/30/2015   HGBA1C 7.1 (H) 02/25/2015   HGBA1C 7.0 (H) 08/26/2014   HGBA1C (H) 09/16/2009    6.5 (NOTE) The ADA recommends the following therapeutic goal for glycemic control related to Hgb A1c measurement: Goal of therapy: <6.5 Hgb A1c  Reference: American Diabetes Association: Clinical Practice Recommendations 2010, Diabetes Care, 2010, 33: (Suppl  1).  01/14/2016: HbA1c 11.3%  Pt is on a regimen of: - Glipizide ER 5 mg before b'fast and added another 5 mg tablet at bedtime - Victoza 1.8 mg in a.m. - Levemir 30 >> 45 >> 60 units at bedtime  She was also on: - Glipizide 5 mg before dinner >> stopped  - Glimepiride 4 mg before b'fast/ Glipizide XL 10 mg in am  - Tradjenta 5 mg in am  These were stopped when she started insulin. She was on Metformin >> diarrhea.  Pt checks her sugars 0-1x a day: - am:  69, 82-115, 135 >> 83-105 >> 102, 126-132 >> 96-120 - 2h after b'fast: 127 >> n/c >> 126 >> n/c - before lunch: 100, 120, 153 >> n/c >> 64 (did not eat b'fast and took medicines) - 2h after lunch: n/c - before dinner:68, 113 >> 80-120 >> n/c - 2h after dinner: n/c >> 196 x1 >> n/c - bedtime:  83, 96-100 >> 140-152 >> 120 - nighttime: n/c Lowest sugar was 69 >> 64 >> 102 >> 64; it is unclear if she has hypoglycemia awareness. Highest sugar was 156 >> 200s >> 300 x1 >> 180.  Glucometer: AccuChek Aviva  Meals: fish  - fried - once a month when she eats out; no red meat.  Tries to eat a lot of  salads, but uses ranch dressing.  -No CKD, last BUN/creatinine reviewed:  Lab Results  Component Value Date   BUN 15 10/23/2016   BUN 7 05/26/2016   CREATININE 0.81 10/23/2016   CREATININE 0.65 05/26/2016  01/13/2016: 12/0.83, glucose 472  -+ HL; last set of lipids reviewed: Lab Results  Component Value Date   CHOL 141 02/29/2016   HDL 67 02/29/2016   LDLCALC 55 02/29/2016   TRIG 94 02/29/2016   CHOLHDL 2.1 02/29/2016  01/13/2016: 147/218/73/69 On Crestor 40. - last eye exam was in 2019: No DR reportedly.  She also has incipient cataract. -+ Numbness and tingling in her feet.  Sees podiatry. On Neurontin 300 mg 2x a day. On ASA 81.  Last TSH normal: 01/13/2016: TSH 1.39 Lab Results  Component Value Date   TSH 0.835 08/30/2015   She was admitted with CP 05/2016 (found to have hypokalemia, mm cramp)  ROS: Constitutional: + weight gain/+ weight loss, no fatigue, + subjective hyperthermia, no subjective hypothermia Eyes: no blurry vision, no xerophthalmia ENT: no sore throat, no nodules palpated in neck, no dysphagia, no odynophagia, no hoarseness Cardiovascular: no CP/+ SOB/no palpitations/+ leg swelling Respiratory: no cough/+ SOB/no wheezing Gastrointestinal: + N/no V/no D/no C/+ acid reflux Musculoskeletal: no muscle aches/no joint aches  Skin: no rashes, no hair loss Neurological: no tremors/+ numbness/+ tingling/no dizziness, + HA  I reviewed pt's medications, allergies, PMH, social hx, family hx, and changes were documented in the history of present illness. Otherwise, unchanged from my initial visit note.  Past Medical History:  Diagnosis Date  . Anxiety   . Bipolar disorder ([place])   . Chest pain    a. 01/2003 MV w/ ? ant-inflat ischemia-->Cath: LAD 20p, otw nonobs dzs-->Med Rx; b. 2009 Cath: LAD 30, LCX nl/nondominant, RCA 30, nl EF; c. 08/2014 MV: no ischemia/infarct.  . Complication of anesthesia    pt. states she is difficult to wake up  . Depression   .  Diabetes mellitus    Type II  . Diabetic neuropathy ([place])   . Diverticulitis   . GERD (gastroesophageal reflux disease)   . Headache   . History of bronchitis   . Hypercholesteremia   . Hypertension   . Hypokalemia   . PONV (postoperative nausea and vomiting)   . Shortness of breath dyspnea   . Sleep apnea   . Vertigo    patient reported   Past Surgical History:  Procedure Laterality Date  . ABDOMINAL HYSTERECTOMY    . APPENDECTOMY    . CARDIAC CATHETERIZATION  2009   normal cath  . CARDIAC CATHETERIZATION  2004   normal cath  . CHOLECYSTECTOMY    . TONSILLECTOMY    . TONSILLECTOMY/ADENOIDECTOMY/TURBINATE REDUCTION Bilateral 03/01/2015   Procedure: TONSILLECTOMY/TURBINATE REDUCTION;  Surgeon: Rozetta Nunnery, MD;  Location: [place];  Service: ENT;  Laterality: Bilateral;  . UVULOPALATOPHARYNGOPLASTY Bilateral 03/01/2015   Procedure: UVULOPALATOPHARYNGOPLASTY (UPPP);  Surgeon: Rozetta Nunnery, MD;  Location: [place];  Service: ENT;  Laterality: Bilateral;  . WISDOM TOOTH EXTRACTION     Social History   Social History  . Marital status: Divorced    Spouse name: N/A  . Number of children: 2   Occupational History  . Disability    Social History Main Topics  . Smoking status: Former Smoker    Quit date: 08/07/1982  . Smokeless tobacco: Never Used  . Alcohol use Yes     Comment: Drinks during the weekends 1 beer or 1 drink of liquor per day   . Drug use: No  . Sexual activity: Yes    Partners: Male    Birth control/ protection: Surgical   Current Outpatient Medications on File Prior to Visit  Medication Sig Dispense Refill  . ACCU-CHEK SMARTVIEW test strip USE AS INSTRUCTED TO CHECK SUGAR 2 TIMES DAILY 200 each 5  . amLODipine (NORVASC) 5 MG tablet TAKE 1 TABLET (5 MG TOTAL) BY MOUTH DAILY. 90 tablet 3  . aspirin EC 81 MG tablet Take 81 mg by mouth daily.    . Blood Glucose Monitoring Suppl (ACCU-CHEK NANO SMARTVIEW) w/Device KIT USE TO CHECK SUGAR DAILY. 1 kit  0  . celecoxib (CELEBREX) 200 MG capsule Take 200 mg by mouth daily as needed.     Marland Kitchen glipiZIDE (GLUCOTROL XL) 5 MG 24 hr tablet Take 1 tablet (5 mg total) by mouth daily with breakfast. 90 tablet 3  . Insulin Pen Needle (BD PEN NEEDLE NANO U/F) 32G X 4 MM MISC USE EVERY DAY WITH DIABETIC INJECTABLES 100 each 2  . LEVEMIR FLEXTOUCH 100 UNIT/ML Pen INJECT 60 UNITS INTO THE SKIN AT BEDTIME. NEED APPOINTMENT FOR FURTHER REFILLS 45 pen 0  . liraglutide (VICTOZA) 18 MG/3ML SOPN INJECT 0.3 MLS (1.8 MG TOTAL) INTO THE SKIN DAILY. NEED APPOINTMENT  27 pen 0  . metoprolol succinate (TOPROL-XL) 25 MG 24 hr tablet TAKE 1 TABLET BY MOUTH EVERY DAY 90 tablet 2  . rosuvastatin (CRESTOR) 40 MG tablet Take 0.5 tablets (20 mg total) by mouth daily. 90 tablet 0  . spironolactone (ALDACTONE) 25 MG tablet Take 12.5 mg by mouth daily.    . valACYclovir (VALTREX) 500 MG tablet Take 500 mg by mouth daily.     No current facility-administered medications on file prior to visit.    Allergies  Allergen Reactions  . Other Other (See Comments)    No Blood Products, personal preference  . Latex Itching and Rash   Family History  Problem Relation Age of Onset  . Diabetes Mother   . Gout Mother   . Hypertension Mother   . Arthritis Mother   . Heart disease Father   . Heart failure Sister   . Arthritis Sister   . Heart disease Maternal Grandmother   . Diabetes Maternal Grandmother   . Heart disease Paternal Grandmother   . Diabetes Paternal Grandmother   . Diabetes Sister   . Bursitis Sister   . Crohn's disease Sister    PE: BP 120/70   Pulse 84   Ht _0  (1.676 m) Comment: measured  Wt 193 lb (87.5 kg)   SpO2 97%   BMI 31.15 kg/m  Wt Readings from Last 3 Encounters:  07/15/18 193 lb (87.5 kg)  03/27/18 190 lb (86.2 kg)  02/18/18 192 lb 9.6 oz (87.4 kg)   Constitutional: overweight, in NAD Eyes: PERRLA, EOMI, no exophthalmos ENT: moist mucous membranes, no thyromegaly, no cervical  lymphadenopathy Cardiovascular: RRR, No MRG Respiratory: CTA B Gastrointestinal: abdomen soft, NT, ND, BS+ Musculoskeletal: no deformities, strength intact in all 4 Skin: moist, warm, no rashes Neurological: no tremor with outstretched hands, DTR normal in all 4  ASSESSMENT: 1. DM2, insulin-dependent, now more controlled, with complications - CAD - PN  2. HL  3.  Obesity class I  PLAN:  1. Patient with longstanding, uncontrolled, type 2 diabetes, with improved control until last visit, but lost for follow-up afterwards.  At that time, sugars were higher but improved after she started to exercise.  Of note, I did see her after the winter holidays at the beginning of the year.  I advised her to increase Victoza but if not tolerated, to increase glipizide.  I also advised her that if the sugars improve, to decrease Levemir again. -At this visit, sugars are at goal per her report, however, HbA1c returned above target, at 7.5%. -We discussed about improving her diet, by eliminating the ranch dressing and also ideally fried foods.  Also, she is planning to restart exercising.  She is interested in reducing the dose of insulin.  We will try to reduce it by 10 units.  Will continue glipizide and Victoza for now. -She has muscle cramps and numbness/tingling in her feet due to neuropathy.  I advised her to stay well-hydrated but also will add alpha lipoic acid  and B complex. - I suggested to:  Patient Instructions     Please continue: - Glipizide ER 5 mg before b'fast and before dinner - Victoza 1.8 mg in a.m.  Please decrease: - Levemir  to 35 units  (try 30 units if sugars remain controlled) at bedtime  Can try the following combination for neuropathy: - alpha-lipoic acid 600 mg twice a day - B complex once a day  Please return in  3-4 months with your sugar  log.   - continue checking sugars at different times of the day - check 1-2x a day, rotating checks - advised for yearly eye  exams >> she is UTD - Return to clinic in 4 mo with sugar log   2. HL - Reviewed  lipid panel from 2017: Excellent, but will need to obtain her newest panel. Lab Results  Component Value Date   CHOL 141 02/29/2016   HDL 67 02/29/2016   LDLCALC 55 02/29/2016   TRIG 94 02/29/2016   CHOLHDL 2.1 02/29/2016  - Continues Crestor without side effects.  3.  Obesity class I -No significant weight loss since last visit -Continue Victoza which should also help with weight loss.  Reducing the insulin will also help.  Starting exercise and improving her diet will probably help the most.  Philemon Kingdom, MD PhD [facility]

## 2018-07-15 NOTE — Patient Instructions (Addendum)
  Please continue: - Glipizide ER 5 mg before b'fast and before dinner - Victoza 1.8 mg in a.m.  Please decrease: - Levemir to 35 units (try 30 units if sugars remain controlled) at bedtime  Can try the following combination for neuropathy: - alpha-lipoic acid 600 mg twice a day - B complex once a day  Please return in 3-4 months with your sugar log.

## 2018-07-15 NOTE — Addendum Note (Signed)
Addended by: Darliss RidgelONGER, Maricus Tanzi I on: 07/15/2018 01:25 PM   Modules accepted: Orders

## 2018-09-30 ENCOUNTER — Other Ambulatory Visit: Payer: Self-pay | Admitting: Internal Medicine

## 2018-10-09 ENCOUNTER — Ambulatory Visit (INDEPENDENT_AMBULATORY_CARE_PROVIDER_SITE_OTHER): Payer: Medicare HMO | Admitting: Cardiovascular Disease

## 2018-10-09 ENCOUNTER — Encounter: Payer: Self-pay | Admitting: Cardiovascular Disease

## 2018-10-09 VITALS — BP 110/78 | HR 89 | Ht 66.0 in | Wt 192.6 lb

## 2018-10-09 DIAGNOSIS — E785 Hyperlipidemia, unspecified: Secondary | ICD-10-CM

## 2018-10-09 DIAGNOSIS — Z794 Long term (current) use of insulin: Secondary | ICD-10-CM

## 2018-10-09 DIAGNOSIS — G4730 Sleep apnea, unspecified: Secondary | ICD-10-CM

## 2018-10-09 DIAGNOSIS — E669 Obesity, unspecified: Secondary | ICD-10-CM

## 2018-10-09 DIAGNOSIS — E119 Type 2 diabetes mellitus without complications: Secondary | ICD-10-CM | POA: Diagnosis not present

## 2018-10-09 DIAGNOSIS — I10 Essential (primary) hypertension: Secondary | ICD-10-CM

## 2018-10-09 NOTE — Patient Instructions (Signed)

## 2018-10-09 NOTE — Progress Notes (Signed)
Patient ID: [facility], female   DOB: 03-May-1956, 63 y.o.   MRN: [id number]    Primary MD:  Dr. Loma Sousa  HPI: [facility] is a 63 y.o. female who presents for an 8 month  follow-up evaluation.   I had seen [facility] initially in 2004 after she had experienced chest pressure and was evaluated at [facility] emergency room.  A nuclear perfusion study raised the possibility of anterior to inferolateral wall ischemia.  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  She was treated aggressively with statin therapy in attempt to induce plaque or aggression.  She had a second heart catheterization which was done by Dr. Gwenlyn Found in 2009 which showed 30% LAD and RCA stenoses with a normal nondominant circumflex and with continued normal LV function.  She developed diabetes mellitus over the last 4 years.  She is on medication for hypertension as well as GERD, hyperlipidemia, asthma and peripheral neuropathy.  She also was diagnosed with sleep apnea and is using CPAP therapy.  When I saw her one year ago she had developed episodes of chest discomfort.  Oftentimes these are sharp and short-lived.   A 2-D echo Doppler study on 08/12/2014 showed normal systolic function without regional wall motion abnormalities or abnormal valvular architecture.  A nuclear perfusion study revealed an ejection fraction of 58% with normal perfusion and function.  There were no ECG changes on the study.  She underwent laboratory which demonstrated a hemoglobin of 13.9, hematocrit 41.3.  Electrolytes were normal, although glucose was mildly elevated at 1:15.  She had normal liver function studies with the exception of minimal alkaline phosphatase elevation at 136.  Her lipids were excellent with a total cholesterol 151, triglycerides 97, HDL 52, LDL 80.   Hemoglobin A1c was increased at 7.0.  She presents now for follow-up evaluation.  When I last saw her in January 2017, she had stopped taking Crestor. She was inactive and not exercising.  He had noted some mild leg swelling.  She was taking amlodipine for hypertension.  At that time she was on CPAP therapy.  When I saw her in February 2018 at which time she was not using CPAP and needed a humidifier.  She had experienced episodes of atypical chest pain described as a shooting pain on the right side of her chest.  Oftentimes her pain occurs while she is sitting and is nonexertional.  She underwent a nuclear perfusion study in August 2017, which was low risk and showed normal perfusion.  EF was 49% without wall motion abnormalities.  She tells me she underwent rotator cuff surgery.    She [facility] Michela Pitcher.  Apparently on that study she was not found to have significant overall sleep apnea with an AHI of 2.9.  There was very mild sleep apnea with supine position at 5.1 and during REM sleep at 7.2/h.  She has not been on CPAP therapy but it was recommended that if the patient gains weight and becomes more sleepy repeat study may be necessary.  Epworth Sleepiness Scale score was endorsed during her July 2019 evaluation which was elevated as shown below;   Epworth Sleepiness Scale: Situation   Chance of Dozing/Sleeping (0 = never , 1 = slight chance , 2 = moderate chance , 3 =  high chance )   sitting and reading 3   watching TV 3   sitting inactive in a public place 3   being a passenger in a motor vehicle for an hour or more 2   lying down in the afternoon 2   sitting and talking to someone 0   sitting quietly after lunch (no alcohol) 1   while stopped for a few minutes in traffic as the driver 1   Total Score  15   She has been followed by Dr. Loma Sousa.  Last saw her she was having fatigue and had not been taking her blood pressure  medications had included amlodipine 5 mg Toprol-XL 25 mg and spironolactone.  She had complained of shortness of breath at that time.    At her last evaluation, complaints of mild shortness of breath and some trace edema I recommended resumption of a reduced dose of spironolactone initially at 12.5 mg.  She had also stopped her lipid-lowering therapy and with her mild CAD I recommend resumption of rosuvastatin.  Over the past several months, she has felt well.  Most times her blood pressure is stable but there have been a couple of occurrences where she believes her blood pressure had gone below 100.  She has been taking amlodipine 5 mg, Toprol-XL 25 mg, and spironolactone 12.5 mg.  She is back on rosuvastatin 20 mg.  She will be under going laboratory with her primary physician Dr. Jackson Latino.  She believes she is sleeping well and is no longer on CPAP therapy.  She presents for reevaluation.   Past Medical History:  Diagnosis Date  . Anxiety   . Bipolar disorder ([place])   . Chest pain    a. 01/2003 MV w/ ? ant-inflat ischemia-->Cath: LAD 20p, otw nonobs dzs-->Med Rx; b. 2009 Cath: LAD 30, LCX nl/nondominant, RCA 30, nl EF; c. 08/2014 MV: no ischemia/infarct.  . Complication of anesthesia    pt. states she is difficult to wake up  . Depression   . Diabetes mellitus    Type II  . Diabetic neuropathy ([place])   . Diverticulitis   . GERD (gastroesophageal reflux disease)   . Headache   . History of bronchitis   . Hypercholesteremia   . Hypertension   . Hypokalemia   . PONV (postoperative nausea and vomiting)   . Shortness of breath dyspnea   . Sleep apnea   . Vertigo    patient reported    Past Surgical History:  Procedure Laterality Date  . ABDOMINAL HYSTERECTOMY    . APPENDECTOMY    . CARDIAC CATHETERIZATION  2009   normal cath  . CARDIAC CATHETERIZATION  2004   normal cath  . CHOLECYSTECTOMY    . TONSILLECTOMY    . TONSILLECTOMY/ADENOIDECTOMY/TURBINATE REDUCTION Bilateral  03/01/2015   Procedure: TONSILLECTOMY/TURBINATE REDUCTION;  Surgeon: Rozetta Nunnery, MD;  Location: [place];  Service: ENT;  Laterality: Bilateral;  . UVULOPALATOPHARYNGOPLASTY Bilateral 03/01/2015   Procedure: UVULOPALATOPHARYNGOPLASTY (UPPP);  Surgeon: Rozetta Nunnery, MD;  Location: [facility];  Service: ENT;  Laterality: Bilateral;  . WISDOM TOOTH EXTRACTION      Allergies  Allergen Reactions  . Other Other (See Comments)    No Blood Products, personal preference  . Latex Itching and Rash    Current Outpatient Medications  Medication Sig Dispense Refill  . ACCU-CHEK SMARTVIEW test strip USE AS INSTRUCTED TO CHECK SUGAR 2 TIMES DAILY 200 each 5  . amLODipine (NORVASC) 5 MG tablet TAKE 1 TABLET (5  MG TOTAL) BY MOUTH DAILY. 90 tablet 3  . aspirin EC 81 MG tablet Take 81 mg by mouth daily.    . Blood Glucose Monitoring Suppl (ACCU-CHEK NANO SMARTVIEW) w/Device KIT USE TO CHECK SUGAR DAILY. 1 kit 0  . celecoxib (CELEBREX) 200 MG capsule Take 200 mg by mouth daily as needed.     . gabapentin (NEURONTIN) 300 MG capsule Take 300 mg by mouth 2 (two) times daily.    Marland Kitchen glipiZIDE (GLUCOTROL XL) 5 MG 24 hr tablet Take 1 tablet (5 mg total) by mouth daily with breakfast. 90 tablet 3  . Insulin Pen Needle 32G X 4 MM MISC USE EVERY DAY WITH DIABETIC INJECTABLES 100 each 2  . LEVEMIR FLEXTOUCH 100 UNIT/ML Pen INJECT 60 UNITS INTO THE SKIN AT BEDTIME. NEED APPOINTMENT FOR FURTHER REFILLS 45 pen 0  . liraglutide (VICTOZA) 18 MG/3ML SOPN INJECT 0.3 MLS (1.8 MG TOTAL) INTO THE SKIN DAILY. NEED APPOINTMENT 27 pen 0  . metoprolol succinate (TOPROL-XL) 25 MG 24 hr tablet TAKE 1 TABLET BY MOUTH EVERY DAY 90 tablet 2  . rosuvastatin (CRESTOR) 40 MG tablet Take 0.5 tablets (20 mg total) by mouth daily. 90 tablet 0  . spironolactone (ALDACTONE) 25 MG tablet Take 12.5 mg by mouth daily.    . valACYclovir (VALTREX) 500 MG tablet Take 500 mg by mouth daily.     No current facility-administered medications for  this visit.     Socially she is divorced.  She quit tobacco use in 1986.  She does drink occasional alcohol.  She does not routinely exercise.  Family History  Problem Relation Age of Onset  . Diabetes Mother   . Gout Mother   . Hypertension Mother   . Arthritis Mother   . Heart disease Father   . Heart failure Sister   . Arthritis Sister   . Heart disease Maternal Grandmother   . Diabetes Maternal Grandmother   . Heart disease Paternal Grandmother   . Diabetes Paternal Grandmother   . Diabetes Sister   . Bursitis Sister   . Crohn's disease Sister    Family history is notable that her mother is alive at 49 and has diabetes mellitus, gout, and hypertension.  Father died at age 33 with heart failure and had a pacemaker.  One brother also has a pacemaker.  One sister has congestive heart failure.  Another sister has diabetes mellitus.  Another sister has Crohn's disease.  ROS General: Negative; No fevers, chills, or night sweats HEENT: Negative; No changes in vision or hearing, sinus congestion, difficulty swallowing Pulmonary: Negative; No cough, wheezing, shortness of breath, hemoptysis Cardiovascular:  See HPI; No anginal chest pain, presyncope, syncope, palpitations, edema GI: Positive for GERD on omeprazole; No nausea, vomiting, diarrhea, or abdominal pain GU: Negative; No dysuria, hematuria, or difficulty voiding Musculoskeletal: Negative; no myalgias, joint pain, or weakness Hematologic/Oncologic: Negative; no easy bruising, bleeding Endocrine: Negative; no heat/cold intolerance; no diabetes Neuro: Positive for peripheral neuropathy on gabapentin Skin: Negative; No rashes or skin lesions Psychiatric: Negative; No behavioral problems, depression Sleep: Positive for obstructive sleep apnea not using CPAP therapy with mild daytime sleepiness, hypersomnolence; no bruxism, restless legs, hypnogagnic hallucinations Other comprehensive 14 point system review is  negative   Physical Exam BP 110/78   Pulse 89   Ht 5' 6"  (1.676 m)   Wt 192 lb 9.6 oz (87.4 kg)   BMI 31.09 kg/m    Repeat blood pressure by me was 124/78 supine and was 116/78 standing.  Wt Readings from Last 3 Encounters:  10/09/18 192 lb 9.6 oz (87.4 kg)  07/15/18 193 lb (87.5 kg)  03/27/18 190 lb (86.2 kg)   General: Alert, oriented, no distress.  Skin: normal turgor, no rashes, warm and dry HEENT: Normocephalic, atraumatic. Pupils equal round and reactive to light; sclera anicteric; extraocular muscles intact;  Nose without nasal septal hypertrophy Mouth/Parynx benign; Mallinpatti scale 3 Neck: No JVD, no carotid bruits; normal carotid upstroke Lungs: clear to ausculatation and percussion; no wheezing or rales Chest wall: without tenderness to palpitation Heart: PMI not displaced, RRR, s1 s2 normal, 1/6 systolic murmur, no diastolic murmur, no rubs, gallops, thrills, or heaves Abdomen: Mild central adiposity; soft, nontender; no hepatosplenomehaly, BS+; abdominal aorta nontender and not dilated by palpation. Back: no CVA tenderness Pulses 2+ Musculoskeletal: full range of motion, normal strength, no joint deformities Extremities: no clubbing cyanosis or edema, Homan's sign negative  Neurologic: grossly nonfocal; Cranial nerves grossly wnl Psychologic: Normal mood and affect   ECG (independently read by me): Normal sinus rhythm at 89 bpm, fusion complexes.  Right bundle branch block with repolarization changes.  July 2019 ECG (independently read by me): Sinus rhythm at 78 bpm.  Right bundle branch block with repolarization changes.  No ectopy.  QTc interval 453 ms  February 2018 ECG (independently read by me): Normal sinus rhythm at 81 bpm.  QRS complex V1 and V2.  Normal intervals.  January 2017 ECG (independently read by me):  Normal sinus rhythm with a PAC.  QTc interval 422  January 2016 ECG (independently read by me): Normal sinus rhythm at 77 bpm.  QRS complex V1  V2; nondiagnostic T changes anterolaterally.  QTc interval 454 ms  LABS:  BMP Latest Ref Rng & Units 10/23/2016 05/26/2016 05/25/2016  Glucose 65 - 99 mg/dL 117(H) 129(H) 77  BUN 6 - 20 mg/dL 15 7 6   Creatinine 0.44 - 1.00 mg/dL 0.81 0.65 0.65  Sodium 135 - 145 mmol/L 142 139 141  Potassium 3.5 - 5.1 mmol/L 4.6 3.7 3.3(L)  Chloride 101 - 111 mmol/L 105 105 105  CO2 22 - 32 mmol/L 30 28 27   Calcium 8.9 - 10.3 mg/dL 10.0 8.7(L) 9.1   Hepatic Function Latest Ref Rng & Units 10/23/2016 05/25/2016 02/29/2016  Total Protein 6.5 - 8.1 g/dL 8.7(H) 7.1 7.2  Albumin 3.5 - 5.0 g/dL 4.5 3.8 4.0  AST 15 - 41 U/L 17 31 16   ALT 14 - 54 U/L 22 40 20  Alk Phosphatase 38 - 126 U/L 102 106 99  Total Bilirubin 0.3 - 1.2 mg/dL 0.6 0.3 0.4  Bilirubin, Direct <=0.2 mg/dL - - 0.1   CBC Latest Ref Rng & Units 10/23/2016 05/25/2016 01/15/2016  WBC 4.0 - 10.5 K/uL 7.3 6.5 5.3  Hemoglobin 12.0 - 15.0 g/dL 14.7 14.6 13.6  Hematocrit 36.0 - 46.0 % 43.4 43.4 40.4  Platelets 150 - 400 K/uL 224 171 185   Lab Results  Component Value Date   MCV 83.0 10/23/2016   MCV 82.8 05/25/2016   MCV 81.5 01/15/2016   Lab Results  Component Value Date   TSH 0.835 08/30/2015   Lab Results  Component Value Date   HGBA1C 7.5 (A) 07/15/2018   Lipid Panel     Component Value Date/Time   CHOL 141 02/29/2016 0856   TRIG 94 02/29/2016 0856   HDL 67 02/29/2016 0856   CHOLHDL 2.1 02/29/2016 0856   VLDL 19 02/29/2016 0856   LDLCALC 55 02/29/2016 0856    RADIOLOGY:  No results found.  IMPRESSION: 1. Essential hypertension   2. Hyperlipidemia LDL goal <70   3. Type 2 diabetes mellitus without complication, with long-term current use of insulin ([place])   4. Sleep apnea, unspecified type   5. Mild obesity     ASSESSMENT AND PLAN: [facility] is a 63 year old African-American female who has previously been documented have mild CAD at initial catheterization in 2004 and subsequent cardiac catheterization in 2009.  She  had developed diabetes mellitus, hypertension, obstructive sleep apnea and peripheral neuropathy.  She had a follow-up sleep study  in [place] [facility]  This was done after she had not been utilizing CPAP for several years.  On this study she was not found to have significant overall sleep apnea but there was minimal sleep apnea noted with supine sleep and very mild sleep apnea during REM sleep.  Oxygen desaturated to 83%.  I suspect she still has sleep apnea, unspecified type with increased upper airway resistance.  In the future, may be worthwhile to reassess.  Presently, her blood pressure today is excellent without orthostatic change.  She no longer experiences edema since initiating low-dose spironolactone.  However she is concerned that that she really need to take all her blood pressure medications.  With her rare episodes of blood pressure less than 100, I have suggested that she change her spironolactone from 12.5 mg daily and take this every other day.  She is now back on rosuvastatin 20 mg for hyperlipidemia.  Continues to be on amlodipine and Toprol-XL in addition to the spironolactone for hypertension.  She has not had recent laboratory but will be seeing Dr. Jackson Latino and I have suggested follow-up fasting laboratory be obtained.  With her diabetes mellitus, target LDL is less than 70.  She is diabetic on glipizide, Levemir, and Victoza.  She has peripheral neuropathy on Neurontin.  She is mildly obese with a BMI of 31.1.  Weight loss and increased exercise was recommended.  I will see her in 1 year for reevaluation.   Time spent: 25 minutes  Troy Sine, MD, [facility] 10/09/2018 10:39 AM

## 2018-11-04 ENCOUNTER — Other Ambulatory Visit: Payer: Self-pay | Admitting: Internal Medicine

## 2018-11-19 NOTE — Progress Notes (Deleted)
Patient ID: Bonnie Gordon, female   DOB: [birth year], 63 y.o.   MRN: [id number]  Patient location: Home My location: Office  Referring Provider: Lin Landsman, MD  I connected with the patient on 11/19/18 at  9:00 AM EDT by a video enabled telemedicine application and verified that I am speaking with the correct person.   I discussed the limitations of evaluation and management by telemedicine and the availability of in person appointments. The patient expressed understanding and agreed to proceed.   Details of the encounter are shown below.  HPI: Bonnie Gordon is a 63 y.o.-year-old female, returning for follow-up for DM2, dx in [address]-, insulin-dependent, uncontrolled, with complications (CAD, PN). Last visit 4 months ago.  Last hemoglobin A1c was: Lab Results  Component Value Date   HGBA1C 7.5 (A) 07/15/2018   HGBA1C 8.1 08/24/2017   HGBA1C 6.8 04/25/2017   HGBA1C 6.5 01/18/2017   HGBA1C 6.2 10/18/2016   HGBA1C 7.5 07/20/2016   HGBA1C 9.5 04/20/2016   HGBA1C 8.1 (H) 08/30/2015   HGBA1C 7.1 (H) 02/25/2015   HGBA1C 7.0 (H) 08/26/2014   HGBA1C (H) 09/16/2009    6.5 (NOTE) The ADA recommends the following therapeutic goal for glycemic control related to Hgb A1c measurement: Goal of therapy: <6.5 Hgb A1c  Reference: American Diabetes Association: Clinical Practice Recommendations 2010, Diabetes Care, 2010, 33: (Suppl  1).  01/14/2016: HbA1c 11.3%  Patient is on: - Glipizide ER 5 mg before breakfast and dinner - Victoza 1.8 mg in a.m. - Levemir 30 >> 45 >> 60 >> 35 units at bedtime  She was also on: - Glipizide 5 mg before dinner >> stopped  - Glimepiride 4 mg before b'fast/ Glipizide XL 10 mg in am  - Tradjenta 5 mg in am  These were stopped when she started insulin. She was on Metformin >> diarrhea.  Pt checks her sugars once a day: - am: 83-105 >> 102, 126-132 >> 96-120 - 2h after b'fast: 127 >> n/c >> 126 >> n/c - before lunch: 100, 120, 153 >> n/c >> 64 (did not eat  b'fast and took medicines) - 2h after lunch: n/c - before dinner:68, 113 >> 80-120 >> n/c - 2h after dinner: n/c >> 196 x1 >> n/c - bedtime:  83, 96-100 >> 140-152 >> 120 - nighttime: n/c Lowest sugar was 102 >> 64 >> ***; it is unclear at which level she has hypoglycemia awareness. Highest sugar was 300 x1 >> 180 >> ***.  Glucometer: AccuChek Aviva  Meals: fish  - fried - once a month when she eats out; no red meat.  Tries to eat a lot of salads, but uses ranch dressing.  -No CKD, last BUN/creatinine: Lab Results  Component Value Date   BUN 15 10/23/2016   BUN 7 05/26/2016   CREATININE 0.81 10/23/2016   CREATININE 0.65 05/26/2016  01/13/2016: 12/0.83, glucose 472  -+ HL; last set of lipids: Lab Results  Component Value Date   CHOL 141 02/29/2016   HDL 67 02/29/2016   LDLCALC 55 02/29/2016   TRIG 94 02/29/2016   CHOLHDL 2.1 02/29/2016  01/13/2016: 147/218/73/69 On Crestor 40. - last eye exam was in 2019: No DR reportedly.  She also has incipient cataract. -+ Numbness and tingling in her feet.  Sees podiatry.  On Neurontin 300 mg twice a day.  At last visit I suggested alpha-lipoic acid again B complex.  Last TSH normal: 01/13/2016: TSH 1.39 Lab Results  Component Value Date   TSH 0.835 08/30/2015  She was admitted with CP 05/2016 (found to have hypokalemia, mm cramp)  ROS: Constitutional: no weight gain/no weight loss, no fatigue, no subjective hyperthermia, no subjective hypothermia Eyes: no blurry vision, no xerophthalmia ENT: no sore throat, no nodules palpated in neck, no dysphagia, no odynophagia, no hoarseness Cardiovascular: no CP/no SOB/no palpitations/no leg swelling Respiratory: no cough/no SOB/no wheezing Gastrointestinal: no N/no V/no D/no C/no acid reflux Musculoskeletal: no muscle aches/no joint aches Skin: no rashes, no hair loss Neurological: no tremors/+ numbness/+ tingling/no dizziness  I reviewed pt's medications, allergies, PMH, social hx,  family hx, and changes were documented in the history of present illness. Otherwise, unchanged from my initial visit note.  Past Medical History:  Diagnosis Date  . Anxiety   . Bipolar disorder ([place])   . Chest pain    a. 01/2003 MV w/ ? ant-inflat ischemia-->Cath: LAD 20p, otw nonobs dzs-->Med Rx; b. 2009 Cath: LAD 30, LCX nl/nondominant, RCA 30, nl EF; c. 08/2014 MV: no ischemia/infarct.  . Complication of anesthesia    pt. states she is difficult to wake up  . Depression   . Diabetes mellitus    Type II  . Diabetic neuropathy ([place])   . Diverticulitis   . GERD (gastroesophageal reflux disease)   . Headache   . History of bronchitis   . Hypercholesteremia   . Hypertension   . Hypokalemia   . PONV (postoperative nausea and vomiting)   . Shortness of breath dyspnea   . Sleep apnea   . Vertigo    patient reported   Past Surgical History:  Procedure Laterality Date  . ABDOMINAL HYSTERECTOMY    . APPENDECTOMY    . CARDIAC CATHETERIZATION  2009   normal cath  . CARDIAC CATHETERIZATION  2004   normal cath  . CHOLECYSTECTOMY    . TONSILLECTOMY    . TONSILLECTOMY/ADENOIDECTOMY/TURBINATE REDUCTION Bilateral 03/01/2015   Procedure: TONSILLECTOMY/TURBINATE REDUCTION;  Surgeon: Rozetta Nunnery, MD;  Location: [place];  Service: ENT;  Laterality: Bilateral;  . UVULOPALATOPHARYNGOPLASTY Bilateral 03/01/2015   Procedure: UVULOPALATOPHARYNGOPLASTY (UPPP);  Surgeon: Rozetta Nunnery, MD;  Location: [place];  Service: ENT;  Laterality: Bilateral;  . WISDOM TOOTH EXTRACTION     Social History   Social History  . Marital status: Divorced    Spouse name: N/A  . Number of children: 2   Occupational History  . Disability    Social History Main Topics  . Smoking status: Former Smoker    Quit date: 08/07/1982  . Smokeless tobacco: Never Used  . Alcohol use Yes     Comment: Drinks during the weekends 1 beer or 1 drink of liquor per day   . Drug use: No  . Sexual activity: Yes     Partners: Male    Birth control/ protection: Surgical   Current Outpatient Medications on File Prior to Visit  Medication Sig Dispense Refill  . ACCU-CHEK SMARTVIEW test strip USE AS INSTRUCTED TO CHECK SUGAR 2 TIMES DAILY 200 each 5  . amLODipine (NORVASC) 5 MG tablet TAKE 1 TABLET (5 MG TOTAL) BY MOUTH DAILY. 90 tablet 3  . aspirin EC 81 MG tablet Take 81 mg by mouth daily.    . Blood Glucose Monitoring Suppl (ACCU-CHEK NANO SMARTVIEW) w/Device KIT USE TO CHECK SUGAR DAILY. 1 kit 0  . celecoxib (CELEBREX) 200 MG capsule Take 200 mg by mouth daily as needed.     . gabapentin (NEURONTIN) 300 MG capsule Take 300 mg by mouth 2 (two) times daily.    Marland Kitchen  glipiZIDE (GLUCOTROL XL) 5 MG 24 hr tablet Take 1 tablet (5 mg total) by mouth daily with breakfast. 90 tablet 3  . Insulin Pen Needle 32G X 4 MM MISC USE EVERY DAY WITH DIABETIC INJECTABLES 100 each 2  . LEVEMIR FLEXTOUCH 100 UNIT/ML Pen INJECT 60 UNITS INTO THE SKIN AT BEDTIME. NEED APPOINTMENT FOR FURTHER REFILLS 45 pen 0  . liraglutide (VICTOZA) 18 MG/3ML SOPN INJECT 0.3 MLS (1.8 MG TOTAL) INTO THE SKIN DAILY. NEED APPOINTMENT 27 pen 2  . metoprolol succinate (TOPROL-XL) 25 MG 24 hr tablet TAKE 1 TABLET BY MOUTH EVERY DAY 90 tablet 2  . rosuvastatin (CRESTOR) 40 MG tablet Take 0.5 tablets (20 mg total) by mouth daily. 90 tablet 0  . spironolactone (ALDACTONE) 25 MG tablet Take 12.5 mg by mouth daily.    . valACYclovir (VALTREX) 500 MG tablet Take 500 mg by mouth daily.     No current facility-administered medications on file prior to visit.    Allergies  Allergen Reactions  . Other Other (See Comments)    No Blood Products, personal preference  . Latex Itching and Rash   Family History  Problem Relation Age of Onset  . Diabetes Mother   . Gout Mother   . Hypertension Mother   . Arthritis Mother   . Heart disease Father   . Heart failure Sister   . Arthritis Sister   . Heart disease Maternal Grandmother   . Diabetes Maternal  Grandmother   . Heart disease Paternal Grandmother   . Diabetes Paternal Grandmother   . Diabetes Sister   . Bursitis Sister   . Crohn's disease Sister    PE: There were no vitals taken for this visit. Wt Readings from Last 3 Encounters:  10/09/18 192 lb 9.6 oz (87.4 kg)  07/15/18 193 lb (87.5 kg)  03/27/18 190 lb (86.2 kg)   Constitutional:  in NAD  The physical exam was not performed (virtual visit).  ASSESSMENT: 1. DM2, insulin-dependent, now more controlled, with complications - CAD - PN  2. HL  3.  Obesity class I  PLAN:  1. Patient with longstanding, uncontrolled, type 2 diabetes, with improved control before last visit bilateral follow-up afterwards.  At last visit, sugars were again at goal, per her report, however, HbA1c returned above target, at 7.5%.  At that time, we discussed about improving her diet by eliminating the fatty dressing and also ideally fried foods.  Also she was planning to restart exercise.  We were able to reduce her Levemir dose a little.  We continued glipizide and Victoza then.  I also suggested alpha-lipoic acid and B complex for her neuropathy.  - I suggested to:  Patient Instructions  Please  continue: - Glipizide ER 5 mg before b'fast and before dinner - Victoza 1.8 mg in a.m. - Levemir 35 units at bedtime  Can try the following combination for neuropathy: - alpha-lipoic acid 600 mg twice a day - B complex once a day  Please return in  3-4 months with your sugar log.   - We will check her HbA1c when she returns to the clinic - continue checking sugars at different times of the day - check 1x a day, rotating checks - advised for yearly eye exams >> she is UTD - Return to clinic in 3-4 mo with sugar log    2. HL - Reviewed latest lipid panel from 2017: Excellent, but will need a new lipid panel Lab Results  Component Value Date  CHOL 141 02/29/2016   HDL 67 02/29/2016   LDLCALC 55 02/29/2016   TRIG 94 02/29/2016   CHOLHDL  2.1 02/29/2016  - Continues Crestor without side effects.  3.  Obesity class I -Continue GLP-1 receptor agonist which should also help with weight loss  Philemon Kingdom, MD PhD [facility] Endocrinology

## 2018-11-20 ENCOUNTER — Ambulatory Visit: Payer: Medicare HMO | Admitting: Internal Medicine

## 2018-11-25 ENCOUNTER — Encounter: Payer: Self-pay | Admitting: Internal Medicine

## 2018-11-25 ENCOUNTER — Ambulatory Visit (INDEPENDENT_AMBULATORY_CARE_PROVIDER_SITE_OTHER): Payer: Medicare HMO | Admitting: Internal Medicine

## 2018-11-25 DIAGNOSIS — E1165 Type 2 diabetes mellitus with hyperglycemia: Secondary | ICD-10-CM

## 2018-11-25 DIAGNOSIS — E785 Hyperlipidemia, unspecified: Secondary | ICD-10-CM | POA: Diagnosis not present

## 2018-11-25 DIAGNOSIS — E669 Obesity, unspecified: Secondary | ICD-10-CM | POA: Diagnosis not present

## 2018-11-25 DIAGNOSIS — E1159 Type 2 diabetes mellitus with other circulatory complications: Secondary | ICD-10-CM

## 2018-11-25 MED ORDER — INSULIN DETEMIR 100 UNIT/ML FLEXPEN: PEN_INJECTOR | SUBCUTANEOUS | 2 refills | Status: DC

## 2018-11-25 NOTE — Patient Instructions (Addendum)
Please  continue: - Glipizide ER  5 mg before breakfast and dinner - Victoza 1.8 mg in a.m.  Please return in 3-4 months with your sugar log.

## 2018-11-25 NOTE — Progress Notes (Signed)
Patient ID: Bonnie Gordon, female   DOB: 12-12-1955, 63 y.o.   MRN: [id number]  Patient location: Home My location: Office  Referring Provider: Lin Landsman, MD  I connected with the patient on 11/25/18 at  8:53 AM EDT by a video enabled telemedicine application and verified that I am speaking with the correct person.   I discussed the limitations of evaluation and management by telemedicine and the availability of in person appointments. The patient expressed understanding and agreed to proceed.   Details of the encounter are shown below.  HPI: Bonnie Gordon is a 63 y.o.-year-old female, presenting for follow-up for DM2, dx in 2012, insulin-dependent, uncontrolled, with complications (CAD, PN). Last visit 4 months ago.  She has cramps in toes >> Voltaren helps.  Last hemoglobin A1c was: 11/2018: HbA1c 7.2% Lab Results  Component Value Date   HGBA1C 7.5 (A) 07/15/2018   HGBA1C 8.1 08/24/2017   HGBA1C 6.8 04/25/2017   HGBA1C 6.5 01/18/2017   HGBA1C 6.2 10/18/2016   HGBA1C 7.5 07/20/2016   HGBA1C 9.5 04/20/2016   HGBA1C 8.1 (H) 08/30/2015   HGBA1C 7.1 (H) 02/25/2015   HGBA1C 7.0 (H) 08/26/2014   HGBA1C (H) 09/16/2009    6.5 (NOTE) The ADA recommends the following therapeutic goal for glycemic control related to Hgb A1c measurement: Goal of therapy: <6.5 Hgb A1c  Reference: American Diabetes Association: Clinical Practice Recommendations 2010, Diabetes Care, 2010, 33: (Suppl  1).  01/14/2016: HbA1c 11.3%  Patient is on: - Glipizide ER 5 mg before breakfast and dinner - Victoza 1.8 mg in a.m. - Levemir 30 >> 45 >> 60 >> 35 units at bedtime - ran out ~1 week ago!  She was also on: - Glipizide 5 mg before dinner >> stopped  - Glimepiride 4 mg before b'fast/ Glipizide XL 10 mg in am  - Tradjenta 5 mg in am  These were stopped when she started insulin. She was on Metformin >> diarrhea.  Pt checks her sugars once a day: - am: 83-105 >> 102, 126-132 >> 96-120 >> <120 - 2h after  b'fast: 127 >> n/c >> 126 >> n/c - before lunch: 100, 120, 153 >> n/c >> 64 >> n/c - 2h after lunch: n/c >> 167 (cake) - before dinner:68, 113 >> 80-120 >> n/c - 2h after dinner: n/c >> 196 x1 >> n/c - bedtime:  83, 96-100 >> 140-152 >> 120 >> 120s - nighttime: n/c Lowest sugar was 102 >> 64 >> 92; it is unclear at which level she has hypoglycemia awareness. Highest sugar was 300 x1 >> 180 >> 167.  Glucometer: AccuChek Aviva  Meals: fish  - fried - once a month when she eats out; no red meat.  Salads with ranch dressing.  -No CKD, last BUN/creatinine: Lab Results  Component Value Date   BUN 15 10/23/2016   BUN 7 05/26/2016   CREATININE 0.81 10/23/2016   CREATININE 0.65 05/26/2016  01/13/2016: 12/0.83, glucose 472  -+ HL; last set of lipids: Lab Results  Component Value Date   CHOL 141 02/29/2016   HDL 67 02/29/2016   LDLCALC 55 02/29/2016   TRIG 94 02/29/2016   CHOLHDL 2.1 02/29/2016  01/13/2016: 147/218/73/69 On Crestor 40. - last eye exam was in 2019: Reportedly no DR, she has incipient cataract -+ Numbness and tingling in her feet.  Sees podiatry. Off Neurontin 300 mg twice a day >> changed to Lyrica.  At last visit, I suggested alpha lipoic acid and again B complex.  Latest TSH was  normal: 01/13/2016: TSH 1.39 Lab Results  Component Value Date   TSH 0.835 08/30/2015   She was admitted with CP 05/2016 (found to have hypokalemia, mm cramp)  ROS: Constitutional: no weight gain/no weight loss, no fatigue, no subjective hyperthermia, no subjective hypothermia Eyes: no blurry vision, no xerophthalmia ENT: no sore throat, no nodules palpated in neck, no dysphagia, no odynophagia, no hoarseness Cardiovascular: no CP/no SOB/no palpitations/no leg swelling Respiratory: no cough/no SOB/no wheezing Gastrointestinal: no N/no V/no D/no C/no acid reflux Musculoskeletal: + muscle aches/+ joint aches Skin: no rashes, no hair loss Neurological: no tremors/+ numbness/+  tingling/no dizziness  I reviewed pt's medications, allergies, PMH, social hx, family hx, and changes were documented in the history of present illness. Otherwise, unchanged from my initial visit note.  Past Medical History:  Diagnosis Date  . Anxiety   . Bipolar disorder ([place])   . Chest pain    a. 01/2003 MV w/ ? ant-inflat ischemia-->Cath: LAD 20p, otw nonobs dzs-->Med Rx; b. 2009 Cath: LAD 30, LCX nl/nondominant, RCA 30, nl EF; c. 08/2014 MV: no ischemia/infarct.  . Complication of anesthesia    pt. states she is difficult to wake up  . Depression   . Diabetes mellitus    Type II  . Diabetic neuropathy ([place])   . Diverticulitis   . GERD (gastroesophageal reflux disease)   . Headache   . History of bronchitis   . Hypercholesteremia   . Hypertension   . Hypokalemia   . PONV (postoperative nausea and vomiting)   . Shortness of breath dyspnea   . Sleep apnea   . Vertigo    patient reported   Past Surgical History:  Procedure Laterality Date  . ABDOMINAL HYSTERECTOMY    . APPENDECTOMY    . CARDIAC CATHETERIZATION  2009   normal cath  . CARDIAC CATHETERIZATION  2004   normal cath  . CHOLECYSTECTOMY    . TONSILLECTOMY    . TONSILLECTOMY/ADENOIDECTOMY/TURBINATE REDUCTION Bilateral 03/01/2015   Procedure: TONSILLECTOMY/TURBINATE REDUCTION;  Surgeon: Rozetta Nunnery, MD;  Location: [place];  Service: ENT;  Laterality: Bilateral;  . UVULOPALATOPHARYNGOPLASTY Bilateral 03/01/2015   Procedure: UVULOPALATOPHARYNGOPLASTY (UPPP);  Surgeon: Rozetta Nunnery, MD;  Location: [place];  Service: ENT;  Laterality: Bilateral;  . WISDOM TOOTH EXTRACTION     Social History   Social History  . Marital status: Divorced    Spouse name: N/A  . Number of children: 2   Occupational History  . Disability    Social History Main Topics  . Smoking status: Former Smoker    Quit date: 08/07/1982  . Smokeless tobacco: Never Used  . Alcohol use Yes     Comment: Drinks during the weekends 1 beer  or 1 drink of liquor per day   . Drug use: No  . Sexual activity: Yes    Partners: Male    Birth control/ protection: Surgical   Current Outpatient Medications on File Prior to Visit  Medication Sig Dispense Refill  . ACCU-CHEK SMARTVIEW test strip USE AS INSTRUCTED TO CHECK SUGAR 2 TIMES DAILY 200 each 5  . amLODipine (NORVASC) 5 MG tablet TAKE 1 TABLET (5 MG TOTAL) BY MOUTH DAILY. 90 tablet 3  . aspirin EC 81 MG tablet Take 81 mg by mouth daily.    . Blood Glucose Monitoring Suppl (ACCU-CHEK NANO SMARTVIEW) w/Device KIT USE TO CHECK SUGAR DAILY. 1 kit 0  . celecoxib (CELEBREX) 200 MG capsule Take 200 mg by mouth daily as needed.     Marland Kitchen  gabapentin (NEURONTIN) 300 MG capsule Take 300 mg by mouth 2 (two) times daily.    Marland Kitchen glipiZIDE (GLUCOTROL XL) 5 MG 24 hr tablet Take 1 tablet (5 mg total) by mouth daily with breakfast. 90 tablet 3  . Insulin Pen Needle 32G X 4 MM MISC USE EVERY DAY WITH DIABETIC INJECTABLES 100 each 2  . LEVEMIR FLEXTOUCH 100 UNIT/ML Pen INJECT 60 UNITS INTO THE SKIN AT BEDTIME. NEED APPOINTMENT FOR FURTHER REFILLS 45 pen 0  . liraglutide (VICTOZA) 18 MG/3ML SOPN INJECT 0.3 MLS (1.8 MG TOTAL) INTO THE SKIN DAILY. NEED APPOINTMENT 27 pen 2  . metoprolol succinate (TOPROL-XL) 25 MG 24 hr tablet TAKE 1 TABLET BY MOUTH EVERY DAY 90 tablet 2  . rosuvastatin (CRESTOR) 40 MG tablet Take 0.5 tablets (20 mg total) by mouth daily. 90 tablet 0  . spironolactone (ALDACTONE) 25 MG tablet Take 12.5 mg by mouth daily.    . valACYclovir (VALTREX) 500 MG tablet Take 500 mg by mouth daily.     No current facility-administered medications on file prior to visit.    Allergies  Allergen Reactions  . Other Other (See Comments)    No Blood Products, personal preference  . Latex Itching and Rash   Family History  Problem Relation Age of Onset  . Diabetes Mother   . Gout Mother   . Hypertension Mother   . Arthritis Mother   . Heart disease Father   . Heart failure Sister   . Arthritis  Sister   . Heart disease Maternal Grandmother   . Diabetes Maternal Grandmother   . Heart disease Paternal Grandmother   . Diabetes Paternal Grandmother   . Diabetes Sister   . Bursitis Sister   . Crohn's disease Sister    PE: There were no vitals taken for this visit. Wt Readings from Last 3 Encounters:  10/09/18 192 lb 9.6 oz (87.4 kg)  07/15/18 193 lb (87.5 kg)  03/27/18 190 lb (86.2 kg)   Constitutional:  in NAD  The physical exam was not performed (virtual visit).  ASSESSMENT: 1. DM2, insulin-dependent, now more controlled, with complications - CAD - PN  2. HL  3.  Obesity class I  PLAN:  1. Patient with longstanding, uncontrolled, type 2 diabetes, with improved control before last visit but lost for follow-up afterwards.  At last visit, sugars were again at goal, per her report, however, HbA1c returned above target at 7.5%.  At that time, we discussed about improving her diet eliminating the fatty dressing and also ideally fried foods.  Also, she was planning to restart exercise.  We were able to reduce her Levemir dose a little low we continued glipizide and Victoza.  I also suggested alpha lipoic acid and B complex for her neuropathy. -At this visit, she tells me that she ran out of insulin and did not restart it.  She has been out for almost a week now and sugars did not change much.  Reviewing the sugars at home per her report, she does not absolutely need to add back the insulin but I advised her to check sugars consistently and let me know if they increase.  I also sent a new box of Levemir pens to her pharmacy just in case she needs to restart the insulin. - I suggested to:  Patient Instructions  Please  continue: - Glipizide ER  5 mg before breakfast and dinner - Victoza 1.8 mg in a.m.  Please return in 3-4 months with your sugar log.   -  We will recheck her HbA1c when she returns to the clinic -Check sugars once a day, rotating check times -Advised for yearly  eye exams-she is up-to-date -Return to clinic in 3 to 4 months with your sugar log    2. HL - Reviewed latest lipid panel from 2017: Excellent: Lab Results  Component Value Date   CHOL 141 02/29/2016   HDL 67 02/29/2016   LDLCALC 55 02/29/2016   TRIG 94 02/29/2016   CHOLHDL 2.1 02/29/2016  - Continues Crestor without side effects. -She had annual lipid panel recently and I will need to get these results  3.  Obesity class I -Weight is stable since last visit, but I am hoping that stopping insulin will help with weight loss -Continue GLP-1 receptor agonist which should also help with weight loss  Philemon Kingdom, MD PhD [facility] Endocrinology

## 2019-01-02 ENCOUNTER — Telehealth: Payer: Self-pay

## 2019-01-02 NOTE — Telephone Encounter (Signed)
LOV 11/25/18. Per Dr. Elvera Lennox, f/u appt needed 3-4 mo. Called pt to schedule appt. LVM requesting returned call.

## 2019-01-06 ENCOUNTER — Other Ambulatory Visit (HOSPITAL_BASED_OUTPATIENT_CLINIC_OR_DEPARTMENT_OTHER): Payer: Self-pay | Admitting: Family Medicine

## 2019-01-06 DIAGNOSIS — Z1231 Encounter for screening mammogram for malignant neoplasm of breast: Secondary | ICD-10-CM

## 2019-01-08 ENCOUNTER — Other Ambulatory Visit: Payer: Self-pay | Admitting: Cardiovascular Disease

## 2019-01-08 ENCOUNTER — Ambulatory Visit (HOSPITAL_BASED_OUTPATIENT_CLINIC_OR_DEPARTMENT_OTHER)
Admission: RE | Admit: 2019-01-08 | Discharge: 2019-01-08 | Disposition: A | Discharge status: Home / Self Care | Payer: Medicare HMO | Source: Ambulatory Visit | Attending: Family Medicine | Admitting: Family Medicine

## 2019-01-08 ENCOUNTER — Other Ambulatory Visit: Payer: Self-pay

## 2019-01-08 DIAGNOSIS — Z1231 Encounter for screening mammogram for malignant neoplasm of breast: Secondary | ICD-10-CM | POA: Insufficient documentation

## 2019-03-14 ENCOUNTER — Encounter (HOSPITAL_COMMUNITY): Payer: Self-pay | Admitting: Emergency Medicine

## 2019-03-14 ENCOUNTER — Emergency Department (HOSPITAL_COMMUNITY)
Admission: EM | Admit: 2019-03-14 | Discharge: 2019-03-14 | Disposition: A | Discharge status: Home / Self Care | Payer: Medicare HMO | Attending: Emergency Medicine | Admitting: Emergency Medicine

## 2019-03-14 ENCOUNTER — Emergency Department (HOSPITAL_COMMUNITY): Payer: Medicare HMO

## 2019-03-14 ENCOUNTER — Other Ambulatory Visit: Payer: Self-pay

## 2019-03-14 ENCOUNTER — Telehealth: Payer: Self-pay | Admitting: Cardiovascular Disease

## 2019-03-14 DIAGNOSIS — I10 Essential (primary) hypertension: Secondary | ICD-10-CM | POA: Diagnosis not present

## 2019-03-14 DIAGNOSIS — Z7982 Long term (current) use of aspirin: Secondary | ICD-10-CM | POA: Insufficient documentation

## 2019-03-14 DIAGNOSIS — Z79899 Other long term (current) drug therapy: Secondary | ICD-10-CM | POA: Insufficient documentation

## 2019-03-14 DIAGNOSIS — Z87891 Personal history of nicotine dependence: Secondary | ICD-10-CM | POA: Insufficient documentation

## 2019-03-14 DIAGNOSIS — Z794 Long term (current) use of insulin: Secondary | ICD-10-CM | POA: Insufficient documentation

## 2019-03-14 DIAGNOSIS — E119 Type 2 diabetes mellitus without complications: Secondary | ICD-10-CM | POA: Diagnosis not present

## 2019-03-14 DIAGNOSIS — R079 Chest pain, unspecified: Secondary | ICD-10-CM

## 2019-03-14 DIAGNOSIS — R0602 Shortness of breath: Secondary | ICD-10-CM | POA: Insufficient documentation

## 2019-03-14 DIAGNOSIS — R0789 Other chest pain: Secondary | ICD-10-CM

## 2019-03-14 DIAGNOSIS — R0600 Dyspnea, unspecified: Secondary | ICD-10-CM

## 2019-03-14 LAB — CBC
HCT: 46.3 % — ABNORMAL HIGH (ref 36.0–46.0)
Hemoglobin: 14.9 g/dL (ref 12.0–15.0)
MCH: 27.9 pg (ref 26.0–34.0)
MCHC: 32.2 g/dL (ref 30.0–36.0)
MCV: 86.7 fL (ref 80.0–100.0)
Platelets: 213 10*3/uL (ref 150–400)
RBC: 5.34 MIL/uL — ABNORMAL HIGH (ref 3.87–5.11)
RDW: 14 % (ref 11.5–15.5)
WBC: 5.2 10*3/uL (ref 4.0–10.5)
nRBC: 0 % (ref 0.0–0.2)

## 2019-03-14 LAB — BASIC METABOLIC PANEL
Anion gap: 9 (ref 5–15)
BUN: 6 mg/dL — ABNORMAL LOW (ref 8–23)
CO2: 27 mmol/L (ref 22–32)
Calcium: 9.8 mg/dL (ref 8.9–10.3)
Chloride: 103 mmol/L (ref 98–111)
Creatinine, Ser: 0.79 mg/dL (ref 0.44–1.00)
GFR calc Af Amer: >60 mL/min (ref >60)
GFR calc non Af Amer: >60 mL/min (ref >60)
Glucose, Bld: 113 mg/dL — ABNORMAL HIGH (ref 70–99)
Potassium: 4.2 mmol/L (ref 3.5–5.1)
Sodium: 139 mmol/L (ref 135–145)

## 2019-03-14 LAB — TROPONIN I (HIGH SENSITIVITY)
Troponin I (High Sensitivity): <2 ng/L (ref <18)
Troponin I (High Sensitivity): <2 ng/L (ref <18)

## 2019-03-14 MED ORDER — ALBUTEROL SULFATE HFA 108 (90 BASE) MCG/ACT IN AERS
2.0000 | INHALATION_SPRAY | RESPIRATORY_TRACT | Status: DC | PRN
  Administered 2019-03-14: 2 via RESPIRATORY_TRACT
  Filled 2019-03-14: qty 6.7

## 2019-03-14 MED ORDER — SODIUM CHLORIDE 0.9% FLUSH
3.0000 mL | Freq: Once | INTRAVENOUS | Status: AC
  Administered 2019-03-14: 3 mL via INTRAVENOUS

## 2019-03-14 MED ORDER — OXYCODONE-ACETAMINOPHEN 5-325 MG PO TABS
2.0000 | ORAL_TABLET | Freq: Once | ORAL | Status: AC
  Administered 2019-03-14: 2 via ORAL
  Filled 2019-03-14: qty 2

## 2019-03-14 MED ORDER — ALUM & MAG HYDROXIDE-SIMETH 200-200-20 MG/5ML PO SUSP
30.0000 mL | Freq: Once | ORAL | Status: AC
  Administered 2019-03-14: 30 mL via ORAL
  Filled 2019-03-14: qty 30

## 2019-03-14 NOTE — ED Notes (Signed)
Patient verbalized understanding of discharge instructions and denies any further needs or questions at this time. VS stable. Patient ambulatory with steady gait. Assisted to vehicle in wheelchair.   

## 2019-03-14 NOTE — Telephone Encounter (Signed)
Called patient she states that she has right-sided chest pain, and shortness of breath for the past few days- patient feels it has worsened, denies swelling, BP yesterday- 123/77 HR 96. Has not checked it this morning.  Patient states she is having right arm pain- and dizzy to wear she has to sit down.  Patient is currently having chest pains at this time.   I advised with her current symptoms of the chest and arm pain, along with shortness of breath- advised to go to ER at this time to have EKG and lab work. Advised I would notify Dr.Kelly of recommendations for patient at this time.

## 2019-03-14 NOTE — Telephone Encounter (Signed)
New Message   Pt c/o of Chest Pain: STAT if CP now or developed within 24 hours  1. Are you having CP right now? yes  2. Are you experiencing any other symptoms (ex. SOB, nausea, vomiting, sweating)? Shortness of breath  3. How long have you been experiencing CP? About a week   4. Is your CP continuous or coming and going? Coming and going   5. Have you taken Nitroglycerin? No   ?

## 2019-03-14 NOTE — ED Provider Notes (Addendum)
[facility] EMERGENCY DEPARTMENT Provider Note   CSN: [id number] Arrival date & time: 03/14/19  1102     History   Chief Complaint Chief Complaint  Patient presents with  . Chest Pain    HPI Bonnie Gordon is a 63 y.o. female.     HPI 63 yo female complaining of chest pain and sob for 3 days.  Began when awakening, dyspnea began first.  Pain is in right of side of chest like she's been stomped.  Sharp and radiates to right hand.  Pain comes and go.  No instigating factors, but does seem like when she mjumps up fast she gets sob.  Shooting pain occurs down to hand chest component stays 10- 15 minutes.  Tries to [facility] can feel whrere she massages.  Last night went to breast and hjad to grab breast.  States she had pain goind to breast last month- Dr. Sallee Provencal and asked her if she had a cardiologist.  Card- Dr. Claiborne Billings.  Sees Dr. Claiborne Billings due to cad, states she has had cardiac cath burt hasn't had for a while. Cough nonproductive No fever Headaches and right shoulder pain No known covid exposure Pain now 5 8-9 at worst Pain present constantly for 3 days No lifting No ho dvt No immobization no lateral swelling pmd Cicilia Clinger GATED SPECT MYO PERF [facility] STRESS 1D Order# [id number] Reading physician: Josue Hector, MD Ordering physician: Rogelia Mire, NP Study date: 03/09/16  Patient Information  Name MRN Description  Bonnie Gordon [id number] 63 y.o. female  Result Notes for Myocardial Perfusion Imaging  Notes Recorded by Jeanann Lewandowsky, RMA on 03/14/2016 at 3:51 PM EDT  Pt aware of her stress test results and she verbalized understanding.  ------   Notes Recorded by Loren Racer, LPN on 11/06/3242 at 0:10 AM EDT  Left message to call back  ------   Notes Recorded by Rogelia Mire, NP on 03/09/2016 at 6:02 PM EDT  This is a low risk stress test.  No further testing required.       Vitals  Height Weight BMI (Calculated)  1.676 m  (5' 6" ) 82.1 kg 29.3  Study Highlights     The left ventricular ejection fraction is mildly decreased (45-54%).  Nuclear stress EF: 49%.  There was no ST segment deviation noted during stress.  This is a low risk study.   Normal resting and stress perfusion. No ischemia or infarction EF EF calculated at 49% but no discreet RWMA    Nuclear History and Indications  History and Indications Indication for Stress Test:  Diagnosis of coronary disease History:  Asthma and Chronic Bronchitis; CATH-2004 and 2009-Normal; Last NUC MPI on 08/12/2014-normal;EF=58% Cardiac Risk Factors: Family History - CAD, History of Smoking, Hypertension, IDDM Type 2, Lipids, RBBB and Neuopathy; Hx Hypokalemia; OSA w/CPAP; Chronic Fatigue  Symptoms:  Chest Pain and DOE  Stress Findings  ECG Baseline ECG exhibits normal sinus rhythm..  Stress Findings A pharmacological stress test was performed using IV Lexiscan 0.61m over 10 seconds performed without concurrent submaximal exercise.   The patient experienced no angina during the stress test.   Test was stopped per protocol.  Response to Stress There was no ST segment deviation noted during stress.  Arrhythmias during stress:  none.   Arrhythmias during recovery:  none.     There were no significant arrhythmias noted during the test.   ECG was uninterpretable.  Stress Measurements  Baseline Vitals  Rest HR  69 bpm    Rest BP 122/86 mmHg    Peak Stress Vitals  Peak HR 94 BPM    Peak BP 122 mmHg    Exercise Data  Estimated workload 1 METS       Nuclear Stress Measurements  LV sys vol 38 mL    TID 1.12     LV dias vol 74 mL    SSS 3     SRS 1     SDS 2          Nuclear Stress Findings  Isotope administration Rest isotope was administered  with an IV injection of 10.9 mCi technetium tetrofosmin.  Rest SPECT images were obtained approximately 45 minutes post tracer injection.  Stress isotope was administered  with an IV injection of  31.1 mCi technetium tetrofosmin  20 seconds post IV Lexiscan administration.  Stress SPECT images were obtained approximately 60 minutes post tracer injection.  Nuclear Study Quality Overall image quality is poor.Image quality affected due to patient motion.  Nuclear Measurements Study was gated.  Perfusion Summary Normal resting and stress perfusion. No ischemia or infarction EF EF calculated at 49% but no discreet RWMA  Overall Study Impression Myocardial perfusion is normal.   This is a low risk study.  Overall left ventricular systolic function was abnormal.    LV cavity size is normal.  Nuclear stress EF:  49%.  The left ventricular ejection fraction is mildly decreased (45-54%).   There is no prior study for comparison.  From: ACCF/SCAI/STS/AATS/AHA/ASNC/HFSA/SCCT 2012 Appropriate Use Criteria for Coronary Revascularization Focused Update  Wall Scoring  EF 49% no discrete RWMA        Resulted by:  Signed Date/Time  Phone Pager  Josue Hector 03/09/2016  2:36 PM [phone]   Report approved and finalized on 03/09/2016 1436  Imaging  Imaging Information    Past Medical History:  Diagnosis Date  . Anxiety   . Bipolar disorder ([place])   . Chest pain    a. 01/2003 MV w/ ? ant-inflat ischemia-->Cath: LAD 20p, otw nonobs dzs-->Med Rx; b. 2009 Cath: LAD 30, LCX nl/nondominant, RCA 30, nl EF; c. 08/2014 MV: no ischemia/infarct.  . Complication of anesthesia    pt. states she is difficult to wake up  . Depression   . Diabetes mellitus    Type II  . Diabetic neuropathy ([place])   . Diverticulitis   . GERD (gastroesophageal reflux disease)   . Headache   . History of bronchitis   . Hypercholesteremia   . Hypertension   . Hypokalemia   . PONV (postoperative nausea and vomiting)   . Shortness of breath dyspnea   . Sleep apnea   . Vertigo    patient reported    Patient Active Problem List   Diagnosis Date Noted  . Obesity, Class I, BMI 30-34.9 07/15/2018  . Nonintractable  headache 03/27/2018  . Poorly controlled type 2 diabetes mellitus with circulatory disorder ([place]) 03/01/2016  . RBBB 03/02/2015  . Obstructive apnea 03/01/2015  . CAD (coronary artery disease) 07/08/2014  . Essential hypertension 07/08/2014  . Hyperlipidemia LDL goal <70 07/08/2014  . OSA on CPAP 07/08/2014  . Chest pain 07/08/2014  . Constipation 07/22/2013  . Yeast vaginitis 07/22/2013    Past Surgical History:  Procedure Laterality Date  . ABDOMINAL HYSTERECTOMY    . APPENDECTOMY    . CARDIAC CATHETERIZATION  2009   normal cath  . CARDIAC CATHETERIZATION  2004   normal cath  . CHOLECYSTECTOMY    .  TONSILLECTOMY    . TONSILLECTOMY/ADENOIDECTOMY/TURBINATE REDUCTION Bilateral 03/01/2015   Procedure: TONSILLECTOMY/TURBINATE REDUCTION;  Surgeon: Rozetta Nunnery, MD;  Location: [facility];  Service: ENT;  Laterality: Bilateral;  . UVULOPALATOPHARYNGOPLASTY Bilateral 03/01/2015   Procedure: UVULOPALATOPHARYNGOPLASTY (UPPP);  Surgeon: Rozetta Nunnery, MD;  Location: [place];  Service: ENT;  Laterality: Bilateral;  . WISDOM TOOTH EXTRACTION       OB History   No obstetric history on file.      Home Medications    Prior to Admission medications   Medication Sig Start Date End Date Taking? Authorizing Provider  ACCU-CHEK SMARTVIEW test strip USE AS INSTRUCTED TO CHECK SUGAR 2 TIMES DAILY 04/03/18   Philemon Kingdom, MD  amLODipine (NORVASC) 5 MG tablet TAKE 1 TABLET (5 MG TOTAL) BY MOUTH DAILY. 05/14/18   Troy Sine, MD  aspirin EC 81 MG tablet Take 81 mg by mouth daily.    [provider]  Blood Glucose Monitoring Suppl (ACCU-CHEK NANO SMARTVIEW) w/Device KIT USE TO CHECK SUGAR DAILY. 02/05/17   Philemon Kingdom, MD  celecoxib (CELEBREX) 200 MG capsule Take 200 mg by mouth daily as needed.     [provider]  gabapentin (NEURONTIN) 300 MG capsule Take 300 mg by mouth 2 (two) times daily.    [provider]  glipiZIDE (GLUCOTROL XL) 5 MG 24 hr  tablet Take 1 tablet (5 mg total) by mouth daily with breakfast. 01/18/17   Philemon Kingdom, MD  Insulin Detemir (LEVEMIR FLEXTOUCH) 100 UNIT/ML Pen INJECT 35 UNITS INTO THE SKIN AT BEDTIME. 11/25/18   Philemon Kingdom, MD  Insulin Pen Needle 32G X 4 MM MISC USE EVERY DAY WITH DIABETIC INJECTABLES 09/30/18   Philemon Kingdom, MD  liraglutide (VICTOZA) 18 MG/3ML SOPN INJECT 0.3 MLS (1.8 MG TOTAL) INTO THE SKIN DAILY. NEED APPOINTMENT 11/04/18   Philemon Kingdom, MD  metoprolol succinate (TOPROL-XL) 25 MG 24 hr tablet TAKE 1 TABLET BY MOUTH EVERY DAY 01/08/19   Troy Sine, MD  rosuvastatin (CRESTOR) 40 MG tablet Take 0.5 tablets (20 mg total) by mouth daily. 02/18/18   Troy Sine, MD  spironolactone (ALDACTONE) 25 MG tablet Take 12.5 mg by mouth daily. 09/06/16   [provider]  valACYclovir (VALTREX) 500 MG tablet Take 500 mg by mouth daily. 05/24/16   [provider]    Family History Family History  Problem Relation Age of Onset  . Diabetes Mother   . Gout Mother   . Hypertension Mother   . Arthritis Mother   . Heart disease Father   . Heart failure Sister   . Arthritis Sister   . Heart disease Maternal Grandmother   . Diabetes Maternal Grandmother   . Heart disease Paternal Grandmother   . Diabetes Paternal Grandmother   . Diabetes Sister   . Bursitis Sister   . Crohn's disease Sister     Social History Social History   Tobacco Use  . Smoking status: Former Smoker    Quit date: 08/07/1982    Years since quitting: 36.6  . Smokeless tobacco: Never Used  Substance Use Topics  . Alcohol use: Yes    Comment: occasional / social  . Drug use: No     Allergies   Other and Latex   Review of Systems Review of Systems   Physical Exam Updated Vital Signs BP 110/79 (BP Location: Right Arm)   Pulse 75   Temp 98.5 F (36.9 C) (Oral)   Resp 15   SpO2 97%  Physical Exam Vitals signs and nursing note reviewed.  Constitutional:      Appearance:  She is well-developed.  HENT:     Head: Normocephalic.  Neck:     Musculoskeletal: Normal range of motion.  Cardiovascular:     Rate and Rhythm: Regular rhythm.     Pulses: Normal pulses.     Heart sounds: Normal heart sounds.  Pulmonary:     Effort: Pulmonary effort is normal.     Breath sounds: Normal breath sounds.  Chest:    Abdominal:     General: Abdomen is flat.     Palpations: Abdomen is soft.  Genitourinary:    Rectum: Guaiac result negative.  Musculoskeletal: Normal range of motion.  Skin:    General: Skin is warm.     Capillary Refill: Capillary refill takes less than 2 seconds.  Neurological:     General: No focal deficit present.     Mental Status: She is alert and oriented to person, place, and time. Mental status is at baseline.     Cranial Nerves: No cranial nerve deficit.     Sensory: No sensory deficit.     Motor: No weakness.     Coordination: Coordination normal.  Psychiatric:        Mood and Affect: Mood normal.      ED Treatments / Results  Labs (all labs ordered are listed, but only abnormal results are displayed) Labs Reviewed  BASIC METABOLIC PANEL - Abnormal; Notable for the following components:      Result Value   Glucose, Bld 113 (*)    BUN 6 (*)    All other components within normal limits  CBC - Abnormal; Notable for the following components:   RBC 5.34 (*)    HCT 46.3 (*)    All other components within normal limits  TROPONIN I (HIGH SENSITIVITY)  TROPONIN I (HIGH SENSITIVITY)    EKG EKG Interpretation  Date/Time:  Friday March 14 2019 11:07:55 EDT Ventricular Rate:  82 PR Interval:  164 QRS Duration: 128 QT Interval:  404 QTC Calculation: 472 R Axis:   58 Text Interpretation:  Normal sinus rhythm Right bundle branch block Abnormal ECG rbbb is new from first prior ekg of 26 May 2016 Confirmed by Pattricia Boss [phone]) on 03/14/2019 2:54:08 PM   Radiology No results found.  Procedures Procedures (including critical  care time)  Medications Ordered in ED Medications  sodium chloride flush (NS) 0.9 % injection 3 mL (has no administration in time range)     Initial Impression / Assessment and Plan / ED Course  I have reviewed the triage vital signs and the nursing notes.  Pertinent labs & imaging results that were available during my care of the patient were reviewed by me and considered in my medical decision making (see chart for details).        63 yo female with chest pain and dyspnea for three days.  Pain is right sided and she has point ttp on right chest.  Patient has heart score of 4 due to ho cad and risk factors.  Low index of suspiticion for pe, pneumonia, or other acute intrapulmonary/thoracic eitology.  CAD / STEMI are of concern but given type of pain, duration of symptoms and negative troponin x 2- will discusse with cardiology Patient advised re return precautions and need for follow up and voices understanding.  Discussed with Roby Lofts, PA and will arrange for follow-up in office Discussed all the above with patient  and she voices understanding. Final Clinical Impressions(s) / ED Diagnoses   Final diagnoses:  Chest wall pain    ED Discharge Orders    None       Pattricia Boss, MD 03/14/19 1636    Pattricia Boss, MD 03/24/19 1359

## 2019-03-14 NOTE — ED Triage Notes (Signed)
Pt here for eval of several days of pain to mid chest, radiating into the neck and right arm. Pt also endorses shortness of breath, no swelling or weight gain.

## 2019-03-14 NOTE — Consult Note (Signed)
CARDIOLOGY CONSULT NOTE       Patient [name] MRN: [id number] DOB/AGE: 1955-10-18 63 y.o.  Admit date: 03/14/2019 Referring Physician: Marta Lamas Primary Physician: Lin Landsman, MD Primary Cardiologist: Claiborne Billings Reason for Consultation: Dyspnea/Chest pain   Active Problems:   * No active hospital problems. *   HPI:  63 y.o. with atypical chest pain since [place] and dyspnea. She has had normal cath in 2004 and 2009 and normal myovue in 2016 and 2017 Pain is non exertional right sided and sharp Not pleuritic Some pain to palpation. Dyspnea is exertional EF has been normal in past including myovue and echo. CXR is normal Labs are normal Sats are 100% on RA.  No cough fever or sick contacts. She is able to dance at home for 30 minutes 3-4 times / week with no issues. Otherwise she is rather sedentary. No LE edema or calf pains.  No asthma wheezing or fever   ROS All other systems reviewed and negative except as noted above  Past Medical History:  Diagnosis Date  . Anxiety   . Bipolar disorder ([place])   . Chest pain    a. 01/2003 MV w/ ? ant-inflat ischemia-->Cath: LAD 20p, otw nonobs dzs-->Med Rx; b. 2009 Cath: LAD 30, LCX nl/nondominant, RCA 30, nl EF; c. 08/2014 MV: no ischemia/infarct.  . Complication of anesthesia    pt. states she is difficult to wake up  . Depression   . Diabetes mellitus    Type II  . Diabetic neuropathy ([place])   . Diverticulitis   . GERD (gastroesophageal reflux disease)   . Headache   . History of bronchitis   . Hypercholesteremia   . Hypertension   . Hypokalemia   . PONV (postoperative nausea and vomiting)   . Shortness of breath dyspnea   . Sleep apnea   . Vertigo    patient reported    Family History  Problem Relation Age of Onset  . Diabetes Mother   . Gout Mother   . Hypertension Mother   . Arthritis Mother   . Heart disease Father   . Heart failure Sister   . Arthritis Sister   . Heart disease Maternal Grandmother   . Diabetes Maternal  Grandmother   . Heart disease Paternal Grandmother   . Diabetes Paternal Grandmother   . Diabetes Sister   . Bursitis Sister   . Crohn's disease Sister     Social History   Socioeconomic History  . Marital status: Divorced    Spouse name: Not on file  . Number of children: Not on file  . Years of education: some college  . Highest education level: Not on file  Occupational History  . Occupation: Retired Proofreader  . Financial resource strain: Not on file  . Food insecurity    Worry: Not on file    Inability: Not on file  . Transportation needs    Medical: Not on file    Non-medical: Not on file  Tobacco Use  . Smoking status: Former Smoker    Quit date: 08/07/1982    Years since quitting: 36.6  . Smokeless tobacco: Never Used  Substance and Sexual Activity  . Alcohol use: Yes    Comment: occasional / social  . Drug use: No  . Sexual activity: Yes    Partners: Male    Birth control/protection: Surgical  Lifestyle  . Physical activity    Days per week: Not on file    Minutes per session:  Not on file  . Stress: Not on file  Relationships  . Social Musicianconnections    Talks on phone: Not on file    Gets together: Not on file    Attends religious service: Not on file    Active member of club or organization: Not on file    Attends meetings of clubs or organizations: Not on file    Relationship status: Not on file  . Intimate partner violence    Fear of current or ex partner: Not on file    Emotionally abused: Not on file    Physically abused: Not on file    Forced sexual activity: Not on file  Other Topics Concern  . Not on file  Social History Narrative   Lives at home alone.   Right-handed.    1 cup caffeine per day.    Past Surgical History:  Procedure Laterality Date  . ABDOMINAL HYSTERECTOMY    . APPENDECTOMY    . CARDIAC CATHETERIZATION  2009   normal cath  . CARDIAC CATHETERIZATION  2004   normal cath  . CHOLECYSTECTOMY    . TONSILLECTOMY     . TONSILLECTOMY/ADENOIDECTOMY/TURBINATE REDUCTION Bilateral 03/01/2015   Procedure: TONSILLECTOMY/TURBINATE REDUCTION;  Surgeon: Drema Halonhristopher E Newman, MD;  [facility];  Service: ENT;  Laterality: Bilateral;  . UVULOPALATOPHARYNGOPLASTY Bilateral 03/01/2015   Procedure: UVULOPALATOPHARYNGOPLASTY (UPPP);  Surgeon: Drema Halonhristopher E Newman, MD;  [facility];  Service: ENT;  Laterality: Bilateral;  . WISDOM TOOTH EXTRACTION          Physical Exam: Blood pressure 121/86, pulse 75, temperature 98.5 F (36.9 C), temperature source Oral, resp. rate 18, SpO2 97 %.    Affect appropriate Healthy:  appears stated age HEENT: normal Neck supple with no adenopathy JVP normal no bruits no thyromegaly Lungs clear with no wheezing and good diaphragmatic motion Heart:  S1/S2 no murmur, no rub, gallop or click PMI normal Abdomen: benighn, BS positve, no tenderness, no AAA no bruit.  No HSM or HJR Distal pulses intact with no bruits No edema Neuro non-focal Skin warm and dry No muscular weakness   Labs:   Lab Results  Component Value Date   WBC 5.2 03/14/2019   HGB 14.9 03/14/2019   HCT 46.3 (H) 03/14/2019   MCV 86.7 03/14/2019   PLT 213 03/14/2019    Recent Labs  Lab 03/14/19 1109  NA 139  K 4.2  CL 103  CO2 27  BUN 6*  CREATININE 0.79  CALCIUM 9.8  GLUCOSE 113*   Lab Results  Component Value Date   CKTOTAL 242 (H) 06/09/2011   CKMB 3.5 06/09/2011   TROPONINI <0.03 05/25/2016    Lab Results  Component Value Date   CHOL 141 02/29/2016   CHOL 236 (H) 08/30/2015   CHOL 151 08/26/2014   Lab Results  Component Value Date   HDL 67 02/29/2016   HDL 62 08/30/2015   HDL 52 08/26/2014   Lab Results  Component Value Date   LDLCALC 55 02/29/2016   LDLCALC 140 (H) 08/30/2015   LDLCALC 80 08/26/2014   Lab Results  Component Value Date   TRIG 94 02/29/2016   TRIG 169 (H) 08/30/2015   TRIG 97 08/26/2014   Lab Results  Component Value Date   CHOLHDL 2.1 02/29/2016    CHOLHDL 3.8 08/30/2015   CHOLHDL 2.9 08/26/2014   No results found for: LDLDIRECT    Radiology: Dg Chest Port 1 View  Result Date: 03/14/2019 CLINICAL DATA:  Chest pain  EXAM: PORTABLE CHEST 1 VIEW COMPARISON:  05/25/2016 FINDINGS: The heart size and mediastinal contours are within normal limits. Both lungs are clear. The visualized skeletal structures are unremarkable. IMPRESSION: No active disease. Electronically Signed   By: Judie PetitM.  Shick M.D.   On: 03/14/2019 16:04    EKG:  SR chronic RBBB    ASSESSMENT AND PLAN:   Chest Pain:  R/O no acute ECG changes atypical Multiple previously normal caths and stress tests No high risk signs/exam for PE. Ok to d/c home will arrange outpatient f/u with Dr Tresa EndoKelly May be a reasonable candidate for cardiac CTA as outpatient  Dyspnea:  Comfortable with 100% sats on RA and normal CXR Previously normal EF on echo and myovues Normal exam Labs ok No apparent etiology Discussed with Dr Izetta Dakinaye who did not feel CTA to r/o PE needed   Ok to d/c home will arrange outpatient f/u with DR Tresa EndoKelly  Signed: Charlton HawsPeter Estil Vallee 03/14/2019, 4:58 PM

## 2019-03-14 NOTE — Discharge Instructions (Addendum)
Your EKG and heart enzymes appear normal here today.  Your chest x-Bonnie Gordon is clear.  Cardiology should contact you in the next week for a follow-up visit. Please return if your symptoms are worsening

## 2019-03-17 NOTE — Telephone Encounter (Signed)
acknowledged

## 2019-03-24 NOTE — Progress Notes (Signed)
Cardiology Office Note:    Date:  03/26/2019   ID:  Bonnie Gordon, DOB Jul 14, 1956, MRN [id number]  PCP:  Lin Landsman, MD  Cardiologist:  Shelva Majestic, MD   Referring MD: Lin Landsman, MD   Follow-up for chest pain and dyspnea.  History of Present Illness:    Bonnie Gordon is a 63 y.o. female with a hx of CAD, OSA on CPAP, HLD with LDL goal < 70, HTN, DM, RBBB, and obesity. She has a history of cath in 2004 and 2009 with nonobstructive disease, no intervention; normal myoview in 2016 and 2017. She was just seen in the ER 03/14/19 for chest pain and dyspnea. She ruled out with two negative high sensitivity troponins and no acute findings on EKG. Her O2 saturation was 100% on room air. Dr. Johnsie Cancel  Evaluated her in the ER anf elt she was stable for discharge with close cardiology follow up - possible outpatient CT coronary if symptoms persist. She follows with Dr. Claiborne Billings. She has OSA not compliant on CPAP.  She presents today for ER follow up.  She states that she continues to have chest pain and dyspnea with exertion.  She states that this chest pain and dyspnea has been getting worse over the last 5 years.  She is sleeping in a mechanical bed and states that the head is elevated and that she noticed that she sleeps better the higher the head of the bed is.  She continues to be active and dance in her house however, she states that she notices now she is only able to do about 15 minutes of activity at a time before she has to stop and take a break.  She does not take any medications to help with her symptoms, she rests and allows the symptoms to go away.  She goes on to say that about 4 years ago her PCP prescribed and albuterol inhaler that helped with her shortness of breath.  However, she was instructed to stop taking this because it was believed that she did not have asthma.  Finally she states at times she feels like she might faint and is dizzy, the symptoms are relieved by rest as well.  She denies  lower extremity edema, fatigue, palpitations, melena, hematuria, hemoptysis, diaphoresis, weakness, and PND.   Past Medical History:  Diagnosis Date  . Anxiety   . Bipolar disorder ([place])   . Chest pain    a. 01/2003 MV w/ ? ant-inflat ischemia-->Cath: LAD 20p, otw nonobs dzs-->Med Rx; b. 2009 Cath: LAD 30, LCX nl/nondominant, RCA 30, nl EF; c. 08/2014 MV: no ischemia/infarct.  . Complication of anesthesia    pt. states she is difficult to wake up  . Depression   . Diabetes mellitus    Type II  . Diabetic neuropathy ([place])   . Diverticulitis   . GERD (gastroesophageal reflux disease)   . Headache   . History of bronchitis   . Hypercholesteremia   . Hypertension   . Hypokalemia   . PONV (postoperative nausea and vomiting)   . Shortness of breath dyspnea   . Sleep apnea   . Vertigo    patient reported    Past Surgical History:  Procedure Laterality Date  . ABDOMINAL HYSTERECTOMY    . APPENDECTOMY    . CARDIAC CATHETERIZATION  2009   normal cath  . CARDIAC CATHETERIZATION  2004   normal cath  . CHOLECYSTECTOMY    . TONSILLECTOMY    . TONSILLECTOMY/ADENOIDECTOMY/TURBINATE REDUCTION Bilateral 03/01/2015  Procedure: TONSILLECTOMY/TURBINATE REDUCTION;  Surgeon: Rozetta Nunnery, MD;  Location: [facility];  Service: ENT;  Laterality: Bilateral;  . UVULOPALATOPHARYNGOPLASTY Bilateral 03/01/2015   Procedure: UVULOPALATOPHARYNGOPLASTY (UPPP);  Surgeon: Rozetta Nunnery, MD;  Location: [facility];  Service: ENT;  Laterality: Bilateral;  . WISDOM TOOTH EXTRACTION      Current Medications: Current Meds  Medication Sig  . ACCU-CHEK SMARTVIEW test strip USE AS INSTRUCTED TO CHECK SUGAR 2 TIMES DAILY  . amLODipine (NORVASC) 5 MG tablet TAKE 1 TABLET (5 MG TOTAL) BY MOUTH DAILY. (Patient taking differently: Take 5 mg by mouth daily. )  . aspirin EC 81 MG tablet Take 81 mg by mouth daily.  Marland Kitchen atorvastatin (LIPITOR) 20 MG tablet Take 20 mg by mouth daily.  Marland Kitchen b complex vitamins capsule Take 1  capsule by mouth daily.  . Blood Glucose Monitoring Suppl (ACCU-CHEK NANO SMARTVIEW) w/Device KIT USE TO CHECK SUGAR DAILY.  . celecoxib (CELEBREX) 200 MG capsule Take 200 mg by mouth daily as needed.   . diclofenac sodium (VOLTAREN) 1 % GEL Apply 2 g topically 4 (four) times daily as needed for pain.  Marland Kitchen glipiZIDE (GLUCOTROL XL) 5 MG 24 hr tablet Take 1 tablet (5 mg total) by mouth daily with breakfast. (Patient taking differently: Take 5 mg by mouth 2 (two) times daily. )  . Insulin Detemir (LEVEMIR FLEXTOUCH) 100 UNIT/ML Pen INJECT 35 UNITS INTO THE SKIN AT BEDTIME.  . Insulin Pen Needle 32G X 4 MM MISC USE EVERY DAY WITH DIABETIC INJECTABLES  . liraglutide (VICTOZA) 18 MG/3ML SOPN INJECT 0.3 MLS (1.8 MG TOTAL) INTO THE SKIN DAILY. NEED APPOINTMENT  . Multiple Vitamin (MULTIVITAMIN) capsule Take 1 capsule by mouth daily.  . pregabalin (LYRICA) 50 MG capsule Take 50 mg by mouth 3 (three) times daily as needed (nerve pain).   . rosuvastatin (CRESTOR) 40 MG tablet Take 0.5 tablets (20 mg total) by mouth daily.  Marland Kitchen spironolactone (ALDACTONE) 25 MG tablet Take 25 mg by mouth daily.   . valACYclovir (VALTREX) 500 MG tablet Take 500 mg by mouth daily as needed (flare up).   . [DISCONTINUED] metoprolol succinate (TOPROL-XL) 25 MG 24 hr tablet TAKE 1 TABLET BY MOUTH EVERY DAY     Allergies:   Other and Latex   Social History   Socioeconomic History  . Marital status: Divorced    Spouse name: Not on file  . Number of children: Not on file  . Years of education: some college  . Highest education level: Not on file  Occupational History  . Occupation: Retired Proofreader  . Financial resource strain: Not on file  . Food insecurity    Worry: Not on file    Inability: Not on file  . Transportation needs    Medical: Not on file    Non-medical: Not on file  Tobacco Use  . Smoking status: Former Smoker    Quit date: 08/07/1982    Years since quitting: 36.6  . Smokeless tobacco: Never Used   Substance and Sexual Activity  . Alcohol use: Yes    Comment: occasional / social  . Drug use: No  . Sexual activity: Yes    Partners: Male    Birth control/protection: Surgical  Lifestyle  . Physical activity    Days per week: Not on file    Minutes per session: Not on file  . Stress: Not on file  Relationships  . Social connections    Talks on phone: Not on file  Gets together: Not on file    Attends religious service: Not on file    Active member of club or organization: Not on file    Attends meetings of clubs or organizations: Not on file    Relationship status: Not on file  Other Topics Concern  . Not on file  Social History Narrative   Lives at home alone.   Right-handed.    1 cup caffeine per day.     Family History: The patient'sfamily history includes Arthritis in her mother and sister; Bursitis in her sister; Crohn's disease in her sister; Diabetes in her maternal grandmother, mother, paternal grandmother, and sister; Gout in her mother; Heart disease in her father, maternal grandmother, and paternal grandmother; Heart failure in her sister; Hypertension in her mother.  ROS:   Please see the history of present illness.    All other systems reviewed and are negative.  EKGs/Labs/Other Studies Reviewed:    The following studies were reviewed today:   EKG:  EKG is ordered today.  The ekg ordered today demonstrates normal sinus rhythm with right bundle branch block 83 bpm no acute changes  EKG 03/17/2019 Normal sinus rhythm with right bundle branch block 82 bpm  EKG 03/14/2019 Normal sinus rhythm with right bundle branch block 82 bpm EKG 10/09/2018 Sinus rhythm with fusion complexes branch block 89 bpm EKG 02/18/2018 Normal sinus rhythm with right bundle branch block 78 bpm  Myocardial perfusion study 03/09/2016  The left ventricular ejection fraction is mildly decreased (45-54%).  Nuclear stress EF: 49%.  There was no ST segment deviation noted during  stress.  This is a low risk study.   Normal resting and stress perfusion. No ischemia or infarction EF EF calculated at 49% but no discreet RWMA  Echocardiogram 08/12/2014 Study Conclusions   - Left ventricle: The cavity size was normal. There was mild focal  basal hypertrophy of the septum. Systolic function was normal.  The estimated ejection fraction was in the range of 55% to 60%.  Wall motion was normal; there were no regional wall motion  abnormalities.  - Atrial septum: No defect or patent foramen ovale was identified.  Echo contrast study showed no right-to-left atrial level shunt,  at baseline or with provocation.  Cardiac catheterization 03/24/2008  Left main normal, LAD approximately 30% smooth proximal stenosis, circumflex nondominant normal, ramus branch moderate in size and normal, right coronary artery with 30% stenosis.  LVEF greater than 60% with focal wall motion abnormalities.  Non-critical coronary artery disease with normal left ventricular function medical therapy recommended.  May need GI evaluation (Dr. Claiborne Billings)  Recent Labs: 03/14/2019: BUN 6; Creatinine, Ser 0.79; Hemoglobin 14.9; Platelets 213; Potassium 4.2; Sodium 139  Recent Lipid Panel    Component Value Date/Time   CHOL 141 02/29/2016 0856   TRIG 94 02/29/2016 0856   HDL 67 02/29/2016 0856   CHOLHDL 2.1 02/29/2016 0856   VLDL 19 02/29/2016 0856   LDLCALC 55 02/29/2016 0856    Physical Exam:    VS:  BP 120/80   Pulse 83   Ht 5' 6"  (1.676 m)   Wt 188 lb 9.6 oz (85.5 kg)   SpO2 96%   BMI 30.44 kg/m     Wt Readings from Last 3 Encounters:  03/26/19 188 lb 9.6 oz (85.5 kg)  10/09/18 192 lb 9.6 oz (87.4 kg)  07/15/18 193 lb (87.5 kg)     GEN: Well nourished, well developed in no acute distress HEENT: Normal NECK: No JVD; No  carotid bruits LYMPHATICS: No lymphadenopathy CARDIAC: RRR, no murmurs, rubs, gallops RESPIRATORY:  Clear to auscultation without rales, wheezing or rhonchi   ABDOMEN: Soft, non-tender, non-distended MUSCULOSKELETAL: Slight generalized bilateral lower extremity edema nonpitting; No deformity  SKIN: Warm and dry NEUROLOGIC:  Alert and oriented x 3 PSYCHIATRIC:  Normal affect   ASSESSMENT/ PLAN:    1.Chest pain with exertion- stress test 03/2016 indicated low risk, estimated ejection fraction 45 to 54%.  Echocardiogram 08/12/2014 normal study EF 55 to 60%.  Recent ED visit on 03/14/2019 for increased dyspnea and chest pain ruled out for MI.  Patient indicates dyspnea is getting worse with time and physical activity.  Order coronary CT angio and BMP Patient instructed to present to emergency department if chest pain becomes worse, is constant, or is stabbing chest pain.  She expressed understanding  2.  Hypertension-well-controlled today 120/80.  Patient states she and her metoprolol a few years ago and has maintained her blood pressure.  BUN/creatinine stable. Continue amlodipine 5 mg tablet daily Continue Spironolactone 25 mg tablet daily Heart healthy low-sodium diet Increase physical activity as tolerated  3.  Obstructive sleep apnea-repeat study indicated she was low risk at 2.9 AHI.  Very mild sleep apnea with supine position at 5.1 and during her REM sleep at 7.2/H.  Recommendation was that if she gained more weight or became more sleepy a repeat study would be warranted.  Patient has lost about 4 pounds and is 188 today  4.  Hyperlipidemia- LDL 55 (02/29/2016) Continue rosuvastatin 20 mg daily Increase physical activity as tolerated low-sodium heart healthy diet Monitored by PCP     In order of problems listed above:  Medication Adjustments/Labs and Tests Ordered: Current medicines are reviewed at length with the patient today.  Concerns regarding medicines are outlined above.  Orders Placed This Encounter  Procedures  . CT CORONARY FRACTIONAL FLOW RESERVE DATA PREP  . CT CORONARY FRACTIONAL FLOW RESERVE FLUID ANALYSIS  . CT CORONARY  MORPH W/CTA COR W/SCORE W/CA W/CM &/OR WO/CM  . Basic metabolic panel  . EKG 12-Lead   No orders of the defined types were placed in this encounter.  Disposition: Follow-up with Dr. Claiborne Billings in 3 months   Signed, Deberah Pelton, NP  03/26/2019 2:44 PM    [place]

## 2019-03-26 ENCOUNTER — Ambulatory Visit (INDEPENDENT_AMBULATORY_CARE_PROVIDER_SITE_OTHER): Payer: Medicare HMO | Admitting: General Practice

## 2019-03-26 ENCOUNTER — Encounter: Payer: Self-pay | Admitting: Physician Assistant

## 2019-03-26 ENCOUNTER — Other Ambulatory Visit: Payer: Self-pay

## 2019-03-26 VITALS — BP 120/80 | HR 83 | Ht 66.0 in | Wt 188.6 lb

## 2019-03-26 DIAGNOSIS — G4730 Sleep apnea, unspecified: Secondary | ICD-10-CM | POA: Diagnosis not present

## 2019-03-26 DIAGNOSIS — E785 Hyperlipidemia, unspecified: Secondary | ICD-10-CM

## 2019-03-26 DIAGNOSIS — R079 Chest pain, unspecified: Secondary | ICD-10-CM

## 2019-03-26 DIAGNOSIS — I10 Essential (primary) hypertension: Secondary | ICD-10-CM

## 2019-03-26 MED ORDER — METOPROLOL TARTRATE 100 MG PO TABS: 100.0000 mg | ORAL_TABLET | Freq: Once | ORAL | 0 refills | Status: DC

## 2019-03-26 NOTE — Patient Instructions (Addendum)
Medication Instructions:   Prescription for Metoprolol 100 mg has been sent to your pharmacy. Please take the 100 mg tablet 2 hours prior to your CTA.  Lab work: Your physician recommends that you return for lab work within 1 week of your CTA.   If you have labs (blood work) drawn today and your tests are completely normal, you will receive your results only by: Marland Kitchen MyChart Message (if you have MyChart) OR . A paper copy in the mail If you have any lab test that is abnormal or we need to change your treatment, we will call you to review the results.  Follow-Up: At [facility], you and your health needs are our priority.  As part of our continuing mission to provide you with exceptional heart care, we have created designated Provider Care Teams.  These Care Teams include your primary Cardiologist (physician) and Advanced Practice Providers (APPs -  Physician Assistants and Nurse Practitioners) who all work together to provide you with the care you need, when you need it. You will need a follow up appointment in 3 months.  Please call our office 2 months in advance to schedule this appointment.  You may see Shelva Majestic, MD or one of the following Advanced Practice Providers on your designated Care Team: [place], [place] . Fabian Sharp, PA-C  Any Other Special Instructions Will Be Listed Below (If Applicable).  Your cardiac CT will be scheduled at:  [facility] [address] [place], [place] [address] [phone]   Please arrive at the [facility] main entrance of [facility] 30-45 minutes prior to test start time. Proceed to the [facility] Radiology Department (first floor) to check-in and test prep.  Please follow these instructions carefully (unless otherwise directed):  On the Night Before the Test: . Be sure to Drink plenty of water. . Do not consume any caffeinated/decaffeinated beverages or chocolate 12 hours prior to your test. . Do not take any  antihistamines 12 hours prior to your test. . If you take Metformin do not take 24 hours prior to test.  On the Day of the Test: . Drink plenty of water. Do not drink any water within one hour of the test. . Do not eat any food 4 hours prior to the test. . You may take your regular medications prior to the test.  . Take metoprolol (Lopressor) two hours prior to test. . FEMALES- please wear underwire-free bra if available       After the Test: . Drink plenty of water. . After receiving IV contrast, you may experience a mild flushed feeling. This is normal. . On occasion, you may experience a mild rash up to 24 hours after the test. This is not dangerous. If this occurs, you can take Benadryl 25 mg and increase your fluid intake. . If you experience trouble breathing, this can be serious. If it is severe call 911 IMMEDIATELY. If it is mild, please call our office. . If you take any of these medications: Glipizide/Metformin, Avandament, Glucavance, please do not take 48 hours after completing test.   Please contact the cardiac imaging nurse navigator should you have any questions/concerns Marchia Bond, RN Navigator [facility] [phone] Office  [phone] Cell

## 2019-03-28 ENCOUNTER — Other Ambulatory Visit: Payer: Self-pay

## 2019-04-01 ENCOUNTER — Ambulatory Visit (INDEPENDENT_AMBULATORY_CARE_PROVIDER_SITE_OTHER): Payer: Medicare HMO | Admitting: Internal Medicine

## 2019-04-01 ENCOUNTER — Other Ambulatory Visit: Payer: Self-pay

## 2019-04-01 ENCOUNTER — Encounter: Payer: Self-pay | Admitting: Internal Medicine

## 2019-04-01 VITALS — BP 122/80 | HR 101 | Ht 66.0 in | Wt 187.0 lb

## 2019-04-01 DIAGNOSIS — E1165 Type 2 diabetes mellitus with hyperglycemia: Secondary | ICD-10-CM

## 2019-04-01 DIAGNOSIS — E669 Obesity, unspecified: Secondary | ICD-10-CM

## 2019-04-01 DIAGNOSIS — E785 Hyperlipidemia, unspecified: Secondary | ICD-10-CM

## 2019-04-01 DIAGNOSIS — E1159 Type 2 diabetes mellitus with other circulatory complications: Secondary | ICD-10-CM | POA: Diagnosis not present

## 2019-04-01 LAB — POCT GLYCOSYLATED HEMOGLOBIN (HGB A1C): Hemoglobin A1C: 7.7 % — AB (ref 4.0–5.6)

## 2019-04-01 MED ORDER — GLIPIZIDE ER 5 MG PO TB24: 5.0000 mg | ORAL_TABLET | Freq: Two times a day (BID) | ORAL | 3 refills | Status: DC

## 2019-04-01 NOTE — Addendum Note (Signed)
Addended by: Cardell Peach I on: 04/01/2019 04:41 PM   Modules accepted: Orders

## 2019-04-01 NOTE — Progress Notes (Signed)
Patient ID: Bonnie Gordon, female   DOB: 01/14/1956, 63 y.o.   MRN: [id number]  HPI: Bonnie Gordon is a 63 y.o.-year-old female, presenting for follow-up for DM2, dx in 2012, insulin-independent, uncontrolled, with complications (CAD, PN). Last visit 4 months ago (virtual).  Since last OV, she relaxed her diet >> fried foods. Eating out more. Sugars are higher.  Reviewed HbA1c levels: 11/2018: HbA1c 7.2% Lab Results  Component Value Date   HGBA1C 7.5 (A) 07/15/2018   HGBA1C 8.1 08/24/2017   HGBA1C 6.8 04/25/2017   HGBA1C 6.5 01/18/2017   HGBA1C 6.2 10/18/2016   HGBA1C 7.5 07/20/2016   HGBA1C 9.5 04/20/2016   HGBA1C 8.1 (H) 08/30/2015   HGBA1C 7.1 (H) 02/25/2015   HGBA1C 7.0 (H) 08/26/2014   HGBA1C (H) 09/16/2009    6.5 (NOTE) The ADA recommends the following therapeutic goal for glycemic control related to Hgb A1c measurement: Goal of therapy: <6.5 Hgb A1c  Reference: American Diabetes Association: Clinical Practice Recommendations 2010, Diabetes Care, 2010, 33: (Suppl  1).  01/14/2016: HbA1c 11.3%  Patient is on: - Glipizide ER 5 mg before breakfast and dinner - Victoza 1.8 mg in a.m. >> now at night -  >> stopped 11/2018  She was also on: - Glipizide 5 mg before dinner >> stopped  - Glimepiride 4 mg before b'fast/ Glipizide XL 10 mg in am  - Tradjenta 5 mg in am  These were stopped when she started insulin. She was on Metformin >> diarrhea.  Pt checks her sugars 2x a day: - am:  102, 126-132 >> 96-120 >> <120 >> 96, 118-132, 184 - 2h after b'fast: 127 >> n/c >> 126 >> n/c - before lunch: 100, 120, 153 >> n/c >> 64 >> n/c - 2h after lunch: n/c >> 167 (cake) >> n/c - before dinner:68, 113 >> 80-120 >> n/c >> 130 - 2h after dinner: n/c >> 196 x1 >> n/c - bedtime:  140-152 >> 120 >> 120s >> 120 - nighttime: n/c Lowest sugar was 64 >> 92 >> 96; it is unclear at which level she has hypoglycemia awareness. Highest sugar was 300 x1 >> 180 >> 167 >> 184  (lemonade).  Glucometer: AccuChek Aviva  Meals: fish  - fried - once a month when she eats out; no red meat.  Salads with ranch dressing.  -No CKD, last BUN/creatinine: Lab Results  Component Value Date   BUN 6 (L) 03/14/2019   BUN 15 10/23/2016   CREATININE 0.79 03/14/2019   CREATININE 0.81 10/23/2016  01/13/2016: 12/0.83, glucose 472  -+ HL; last set of lipids: Lab Results  Component Value Date   CHOL 141 02/29/2016   HDL 67 02/29/2016   LDLCALC 55 02/29/2016   TRIG 94 02/29/2016   CHOLHDL 2.1 02/29/2016  01/13/2016: 147/218/73/69 On Atorvastatin now, changed form Crestor. - last eye exam was in 2019: Reportedly no DR; + incipient cataract -+ Numbness and tingling in her feet.  She sees podiatry. Off Neurontin 300 mg twice a day >> changed.  A.  I suggested that fibroid has been B complex  Latest TSH was normal: 01/13/2016: TSH 1.39 Lab Results  Component Value Date   TSH 0.835 08/30/2015   She was admitted with CP 05/2016 (found to have hypokalemia, mm cramp)  ROS: Constitutional: no weight gain/no weight loss, no fatigue, no subjective hyperthermia, no subjective hypothermia Eyes: no blurry vision, no xerophthalmia ENT: no sore throat, no nodules palpated in neck, no dysphagia, no odynophagia, no hoarseness Cardiovascular: no CP/no SOB/no  palpitations/no leg swelling Respiratory: no cough/no SOB/no wheezing Gastrointestinal: no N/no V/no D/no C/no acid reflux Musculoskeletal: no muscle aches/no joint aches Skin: no rashes, no hair loss Neurological: no tremors/+ numbness/+ tingling/no dizziness  I reviewed pt's medications, allergies, PMH, social hx, family hx, and changes were documented in the history of present illness. Otherwise, unchanged from my initial visit note.  Past Medical History:  Diagnosis Date  . Anxiety   . Bipolar disorder ([place])   . Chest pain    a. 01/2003 MV w/ ? ant-inflat ischemia-->Cath: LAD 20p, otw nonobs dzs-->Med Rx; b. 2009 Cath:  LAD 30, LCX nl/nondominant, RCA 30, nl EF; c. 08/2014 MV: no ischemia/infarct.  . Complication of anesthesia    pt. states she is difficult to wake up  . Depression   . Diabetes mellitus    Type II  . Diabetic neuropathy ([place])   . Diverticulitis   . GERD (gastroesophageal reflux disease)   . Headache   . History of bronchitis   . Hypercholesteremia   . Hypertension   . Hypokalemia   . PONV (postoperative nausea and vomiting)   . Shortness of breath dyspnea   . Sleep apnea   . Vertigo    patient reported   Past Surgical History:  Procedure Laterality Date  . ABDOMINAL HYSTERECTOMY    . APPENDECTOMY    . CARDIAC CATHETERIZATION  2009   normal cath  . CARDIAC CATHETERIZATION  2004   normal cath  . CHOLECYSTECTOMY    . TONSILLECTOMY    . TONSILLECTOMY/ADENOIDECTOMY/TURBINATE REDUCTION Bilateral 03/01/2015   Procedure: TONSILLECTOMY/TURBINATE REDUCTION;  Surgeon: Rozetta Nunnery, MD;  Location: [facility];  Service: ENT;  Laterality: Bilateral;  . UVULOPALATOPHARYNGOPLASTY Bilateral 03/01/2015   Procedure: UVULOPALATOPHARYNGOPLASTY (UPPP);  Surgeon: Rozetta Nunnery, MD;  Location: [facility];  Service: ENT;  Laterality: Bilateral;  . WISDOM TOOTH EXTRACTION     Social History   Social History  . Marital status: Divorced    Spouse name: N/A  . Number of children: 2   Occupational History  . Disability    Social History Main Topics  . Smoking status: Former Smoker    Quit date: 08/07/1982  . Smokeless tobacco: Never Used  . Alcohol use Yes     Comment: Drinks during the weekends 1 beer or 1 drink of liquor per day   . Drug use: No  . Sexual activity: Yes    Partners: Male    Birth control/ protection: Surgical   Current Outpatient Medications on File Prior to Visit  Medication Sig Dispense Refill  . ACCU-CHEK SMARTVIEW test strip USE AS INSTRUCTED TO CHECK SUGAR 2 TIMES DAILY 200 each 5  . amLODipine (NORVASC) 5 MG tablet TAKE 1 TABLET (5 MG TOTAL) BY MOUTH DAILY.  (Patient taking differently: Take 5 mg by mouth daily. ) 90 tablet 3  . aspirin EC 81 MG tablet Take 81 mg by mouth daily.    Marland Kitchen atorvastatin (LIPITOR) 20 MG tablet Take 20 mg by mouth daily.    Marland Kitchen b complex vitamins capsule Take 1 capsule by mouth daily.    . Blood Glucose Monitoring Suppl (ACCU-CHEK NANO SMARTVIEW) w/Device KIT USE TO CHECK SUGAR DAILY. 1 kit 0  . celecoxib (CELEBREX) 200 MG capsule Take 200 mg by mouth daily as needed.     . diclofenac sodium (VOLTAREN) 1 % GEL Apply 2 g topically 4 (four) times daily as needed for pain.    Marland Kitchen glipiZIDE (GLUCOTROL XL) 5 MG 24 hr  tablet Take 1 tablet (5 mg total) by mouth daily with breakfast. (Patient taking differently: Take 5 mg by mouth 2 (two) times daily. ) 90 tablet 3  . Insulin Detemir (LEVEMIR FLEXTOUCH) 100 UNIT/ML Pen INJECT 35 UNITS INTO THE SKIN AT BEDTIME. 5 pen 2  . Insulin Pen Needle 32G X 4 MM MISC USE EVERY DAY WITH DIABETIC INJECTABLES 100 each 2  . liraglutide (VICTOZA) 18 MG/3ML SOPN INJECT 0.3 MLS (1.8 MG TOTAL) INTO THE SKIN DAILY. NEED APPOINTMENT 27 pen 2  . metoprolol tartrate (LOPRESSOR) 100 MG tablet Take 1 tablet (100 mg total) by mouth once for 1 dose. 2 hours prior to your CTA. 1 tablet 0  . Multiple Vitamin (MULTIVITAMIN) capsule Take 1 capsule by mouth daily.    . pregabalin (LYRICA) 50 MG capsule Take 50 mg by mouth 3 (three) times daily as needed (nerve pain).     . rosuvastatin (CRESTOR) 40 MG tablet Take 0.5 tablets (20 mg total) by mouth daily. 90 tablet 0  . spironolactone (ALDACTONE) 25 MG tablet Take 25 mg by mouth daily.     . valACYclovir (VALTREX) 500 MG tablet Take 500 mg by mouth daily as needed (flare up).      No current facility-administered medications on file prior to visit.    Allergies  Allergen Reactions  . Other Other (See Comments)    No Blood Products, personal preference  . Latex Itching and Rash   Family History  Problem Relation Age of Onset  . Diabetes Mother   . Gout Mother    . Hypertension Mother   . Arthritis Mother   . Heart disease Father   . Heart failure Sister   . Arthritis Sister   . Heart disease Maternal Grandmother   . Diabetes Maternal Grandmother   . Heart disease Paternal Grandmother   . Diabetes Paternal Grandmother   . Diabetes Sister   . Bursitis Sister   . Crohn's disease Sister    PE: BP 122/80   Pulse (!) 101   Ht 5' 6"  (1.676 m)   Wt 187 lb (84.8 kg)   SpO2 96%   BMI 30.18 kg/m  Wt Readings from Last 3 Encounters:  04/01/19 187 lb (84.8 kg)  03/26/19 188 lb 9.6 oz (85.5 kg)  10/09/18 192 lb 9.6 oz (87.4 kg)   Constitutional: overweight, in NAD Eyes: PERRLA, EOMI, no exophthalmos ENT: moist mucous membranes, no thyromegaly, no cervical lymphadenopathy Cardiovascular: tachycardia, RR, No MRG Respiratory: CTA B Gastrointestinal: abdomen soft, NT, ND, BS+ Musculoskeletal: no deformities, strength intact in all 4 Skin: moist, warm, no rashes Neurological: no tremor with outstretched hands, DTR normal in all 4  ASSESSMENT: 1. DM2, insulin-independent now, uncontrolled, with complications - CAD - PN  2. HL  3.  Obesity class I  PLAN:  1. Patient with longstanding, uncontrolled, type 2 diabetes, with improved control more recently so that she could come off insulin.  Actually, before last visit she ran out of insulin and did not restart it.  S her sugars were still at goal at home, we did not start insulin at last visit but I advised her to let me know if they start increasing.  As of now, she continues on glipizide ER and Victoza, which he tolerates well.  We discussed at previous visit about improving her diet by eliminating fatty foods. -At this visit, sugars are higher in the morning but still at or close to goal.  They do not increase above target  throughout the day.  However, she tells me that she is taking Victoza at that time and we discussed that this is taken in the morning.  We will move this morning but I do not  feel we need to change her regimen and specifically I do not feel we need to add insulin for now.  I advised her to let me know if her sugars increase - I suggested to:  Patient Instructions  Please  continue: - Glipizide ER  5 mg before breakfast and dinner  Please move: - Victoza 1.8 mg to a.m.  Please return in 3-4 months with your sugar log.   - we checked her HbA1c: 7.7% (higher) - advised to check sugars at different times of the day - 1x a day, rotating check times - advised for yearly eye exams >> she is UTD - return to clinic in 3-4 months   2. HL - Reviewed latest lipid panel from 2017: At goal, but recently changed statins per PCP' advice but she is not quite sure why this was changed. Lab Results  Component Value Date   CHOL 141 02/29/2016   HDL 67 02/29/2016   LDLCALC 55 02/29/2016   TRIG 94 02/29/2016   CHOLHDL 2.1 02/29/2016  - now on Lipitor without side effects. -She thought she had another lipid panel with PCP this year, but I checked on this after the visit and it does not appear that she had one since 2017.  We absolutely need to recheck this at next visit.  3.  Obesity class I -Continue GLP-1 receptor agonist which should also help with weight loss -She lost 6 pounds in the last 8 months  Philemon Kingdom, MD PhD [facility] Endocr[facility]ology

## 2019-04-01 NOTE — Patient Instructions (Addendum)
Please  continue: - Glipizide ER  5 mg before breakfast and dinner  Please move: - Victoza 1.8 mg to a.m.  Please return in 3-4 months with your sugar log.

## 2019-04-04 ENCOUNTER — Telehealth (HOSPITAL_COMMUNITY): Payer: Self-pay | Admitting: Emergency Medicine

## 2019-04-04 NOTE — Telephone Encounter (Signed)
Reaching out to patient to offer assistance regarding upcoming cardiac imaging study; pt verbalizes understanding of appt date/time, parking situation and where to check in, pre-test NPO status and medications ordered, and verified current allergies; name and call back number provided for further questions should they arise Sharlee Rufino RN Navigator Cardiac Imaging [facility] [phone] office [phone] cell 

## 2019-04-07 ENCOUNTER — Ambulatory Visit (HOSPITAL_COMMUNITY)
Admission: RE | Admit: 2019-04-07 | Discharge: 2019-04-07 | Disposition: A | Discharge status: Home / Self Care | Payer: Medicare HMO | Source: Ambulatory Visit | Attending: Physician Assistant | Admitting: Physician Assistant

## 2019-04-07 ENCOUNTER — Encounter (HOSPITAL_COMMUNITY): Payer: Self-pay

## 2019-04-07 ENCOUNTER — Other Ambulatory Visit: Payer: Self-pay

## 2019-04-07 DIAGNOSIS — R079 Chest pain, unspecified: Secondary | ICD-10-CM

## 2019-04-07 DIAGNOSIS — I2510 Atherosclerotic heart disease of native coronary artery without angina pectoris: Secondary | ICD-10-CM | POA: Insufficient documentation

## 2019-04-07 DIAGNOSIS — Z006 Encounter for examination for normal comparison and control in clinical research program: Secondary | ICD-10-CM

## 2019-04-07 MED ORDER — NITROGLYCERIN 0.4 MG SL SUBL
SUBLINGUAL_TABLET | SUBLINGUAL | Status: AC
  Filled 2019-04-07: qty 1

## 2019-04-07 MED ORDER — DILTIAZEM HCL 25 MG/5ML IV SOLN
5.0000 mg | Freq: Once | INTRAVENOUS | Status: AC
  Administered 2019-04-07: 5 mg via INTRAVENOUS
  Filled 2019-04-07: qty 5

## 2019-04-07 MED ORDER — DILTIAZEM HCL 25 MG/5ML IV SOLN
5.0000 mg | Freq: Once | INTRAVENOUS | Status: AC
  Administered 2019-04-07: 16:00:00 5 mg via INTRAVENOUS
  Filled 2019-04-07: qty 5

## 2019-04-07 MED ORDER — METOPROLOL TARTRATE 5 MG/5ML IV SOLN
5.0000 mg | INTRAVENOUS | Status: DC | PRN
  Administered 2019-04-07 (×2): 5 mg via INTRAVENOUS
  Filled 2019-04-07: qty 5

## 2019-04-07 MED ORDER — DILTIAZEM HCL 25 MG/5ML IV SOLN
INTRAVENOUS | Status: AC
  Administered 2019-04-07: 5 mg via INTRAVENOUS
  Filled 2019-04-07: qty 5

## 2019-04-07 MED ORDER — METOPROLOL TARTRATE 5 MG/5ML IV SOLN
INTRAVENOUS | Status: AC
  Administered 2019-04-07: 5 mg via INTRAVENOUS
  Filled 2019-04-07: qty 20

## 2019-04-07 MED ORDER — NITROGLYCERIN 0.4 MG SL SUBL
0.8000 mg | SUBLINGUAL_TABLET | Freq: Once | SUBLINGUAL | Status: AC
  Administered 2019-04-07: 0.8 mg via SUBLINGUAL
  Filled 2019-04-07: qty 25

## 2019-04-07 MED ORDER — IOHEXOL 350 MG/ML SOLN
100.0000 mL | Freq: Once | INTRAVENOUS | Status: AC | PRN
  Administered 2019-04-07: 100 mL via INTRAVENOUS

## 2019-04-07 NOTE — Progress Notes (Signed)
   04/07/19 1546  Vital Signs  ECG Heart Rate 84  BP 117/82   Dr. Meda Coffee informed of heart rate. Patient took 100mg  PO Lopressor 2 hours prior to scan. On arrival to radiology heart rate 86, 10mg  of IV lopressor was given without any change in heart rate (see MAR for details). 5mg  of Cardizem given, no change in heart rate still in 80s. Order given from Dr. Meda Coffee to give patient a nother 5mg  of Cardizem and then call her back with vitals.

## 2019-04-07 NOTE — Research (Signed)
Cadfem Informed Consent    Patient Name: Bonnie Gordon    Subject met inclusion and exclusion criteria.  The informed consent form, study requirements and expectations were reviewed with the subject and questions and concerns were addressed prior to the signing of the consent form.  The subject verbalized understanding of the trail requirements.  The subject agreed to participate in the CADFEM trial and signed the informed consent.  The informed consent was obtained prior to performance of any protocol-specific procedures for the subject.  A copy of the signed informed consent was given to the subject and a copy was placed in the subject's medical record.   Neva Seat

## 2019-04-10 ENCOUNTER — Telehealth: Payer: Self-pay | Admitting: Cardiovascular Disease

## 2019-04-10 NOTE — Telephone Encounter (Signed)
Called and advised of CT results.  Made follow up appointment in November to see Dr.Kelly.   Patient verbalized understanding.

## 2019-04-10 NOTE — Telephone Encounter (Signed)
New Message   Patient calling in for CT results.

## 2019-04-21 ENCOUNTER — Other Ambulatory Visit: Payer: Self-pay | Admitting: Internal Medicine

## 2019-04-28 ENCOUNTER — Other Ambulatory Visit: Payer: Self-pay | Admitting: Cardiovascular Disease

## 2019-06-21 ENCOUNTER — Other Ambulatory Visit: Payer: Self-pay | Admitting: Internal Medicine

## 2019-07-02 ENCOUNTER — Other Ambulatory Visit: Payer: Self-pay

## 2019-07-02 ENCOUNTER — Encounter: Payer: Self-pay | Admitting: Cardiovascular Disease

## 2019-07-02 ENCOUNTER — Ambulatory Visit (INDEPENDENT_AMBULATORY_CARE_PROVIDER_SITE_OTHER): Payer: Medicare HMO | Admitting: Cardiovascular Disease

## 2019-07-02 VITALS — BP 122/79 | HR 83 | Temp 95.4°F | Ht 66.0 in | Wt 187.8 lb

## 2019-07-02 DIAGNOSIS — I2510 Atherosclerotic heart disease of native coronary artery without angina pectoris: Secondary | ICD-10-CM | POA: Diagnosis not present

## 2019-07-02 DIAGNOSIS — I10 Essential (primary) hypertension: Secondary | ICD-10-CM

## 2019-07-02 DIAGNOSIS — G4730 Sleep apnea, unspecified: Secondary | ICD-10-CM

## 2019-07-02 DIAGNOSIS — E785 Hyperlipidemia, unspecified: Secondary | ICD-10-CM | POA: Diagnosis not present

## 2019-07-02 DIAGNOSIS — E119 Type 2 diabetes mellitus without complications: Secondary | ICD-10-CM

## 2019-07-02 DIAGNOSIS — Z794 Long term (current) use of insulin: Secondary | ICD-10-CM

## 2019-07-02 DIAGNOSIS — E669 Obesity, unspecified: Secondary | ICD-10-CM

## 2019-07-02 MED ORDER — METOPROLOL SUCCINATE ER 25 MG PO TB24: 25.0000 mg | ORAL_TABLET | Freq: Every day | ORAL | 3 refills | Status: DC

## 2019-07-02 MED ORDER — SPIRONOLACTONE 25 MG PO TABS: 12.5000 mg | ORAL_TABLET | ORAL | 1 refills | Status: DC | PRN

## 2019-07-02 NOTE — Progress Notes (Signed)
Patient ID: Bonnie Gordon, female   DOB: 07/22/1956, 63 y.o.   MRN: [id number] ° ° ° °Primary MD:  Dr. Reece ° °HPI: Bonnie Gordon is a 63 y.o. female who presents for an 8 month  follow-up evaluation.  ° °I had seen Bonnie Gordon initially in 2004 after she had experienced chest pressure and was evaluated at [facility] emergency room.  A nuclear perfusion study raised the possibility of anterior to inferolateral wall ischemia.  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  She was treated aggressively with statin therapy in attempt to induce plaque or aggression.  She had a second heart catheterization which was done by Dr. Berry in 2009 which showed 30% LAD and RCA stenoses with a normal nondominant circumflex and with continued normal LV function. ° °She developed diabetes mellitus over the last 4 years.  She is on medication for hypertension as well as GERD, hyperlipidemia, asthma and peripheral neuropathy.  She also was diagnosed with sleep apnea and is using CPAP therapy. ° °When I saw her one year ago she had developed episodes of chest discomfort.  Oftentimes these are sharp and short-lived.   A 2-D echo Doppler study on 08/12/2014 showed normal systolic function without regional wall motion abnormalities or abnormal valvular architecture.  A nuclear perfusion study revealed an ejection fraction of 58% with normal perfusion and function.  There were no ECG changes on the study.  She underwent laboratory which demonstrated a hemoglobin of 13.9, hematocrit 41.3.  Electrolytes were normal, although glucose was mildly elevated at 1:15.  She had normal liver function studies with the exception of minimal alkaline phosphatase elevation at 136.  Her lipids were excellent with a total cholesterol 151, triglycerides 97, HDL 52, LDL 80.   Hemoglobin A1c was increased at 7.0.  She presents now for follow-up evaluation. ° °When I  saw her in January 2017, she had stopped taking Crestor. She was inactive and not exercising.  He had noted some mild leg swelling.  She was taking amlodipine for hypertension.  At that time she was on CPAP therapy. ° °I saw her February 2018 at which time she was not using CPAP and needed a humidifier.  She had experienced episodes of atypical chest pain described as a shooting pain on the right side of her chest.  Oftentimes her pain occurs while she is sitting and is nonexertional.  She underwent a nuclear perfusion study in August 2017, which was low risk and showed normal perfusion.  EF was 49% without wall motion abnormalities.  She tells me she underwent rotator cuff surgery.   ° °She [facility]. Javaid.  Apparently on that study she was not found to have significant overall sleep apnea with an AHI of 2.9.  There was very mild sleep apnea with supine position at 5.1 and during REM sleep at 7.2/h.  She has not been on CPAP therapy but it was recommended that if the patient gains weight and becomes more sleepy repeat study may be necessary. ° °Epworth Sleepiness Scale score was endorsed during her July 2019 evaluation which was elevated as shown below;  ° °Epworth Sleepiness Scale: °Situation   Chance of Dozing/Sleeping (0 = never , 1 = slight chance , 2 = moderate chance , 3 = high chance )  °   sitting and reading 3  ° watching TV 3  ° sitting inactive in a public place 3  ° being a passenger in a motor vehicle for an hour or more 2  ° lying down in the afternoon 2  ° sitting and talking to someone 0  ° sitting quietly after lunch (no alcohol) 1  ° while stopped for a few minutes in traffic as the driver 1  ° Total Score  15  ° °She has been followed by Dr. Reece.  Last saw her she was having fatigue and had not been taking her blood pressure medications  had included amlodipine 5 mg Toprol-XL 25 mg and spironolactone.  She had complained of shortness of breath at that time.   ° °At her subsequent evaluation, due to complaints of mild shortness of breath and some trace edema I recommended resumption of a reduced dose of spironolactone initially at 12.5 mg.  She had also stopped her lipid-lowering therapy and with her mild CAD I recommend resumption of rosuvastatin. ° °I last saw her in March 2020 at which time she had felt well over the months previous to her appointment.   Most times her blood pressure is stable but there have been a couple of occurrences where she believes her blood pressure had gone below 100.  She has been taking amlodipine 5 mg, Toprol-XL 25 mg, and spironolactone 12.5 mg.  She is back on rosuvastatin 20 mg.  She will be under going laboratory with her primary physician Dr. Betty Reese.  She believes she is sleeping well and is no longer on CPAP therapy.  She presents for reevaluation. ° °She was  seen in the ER 03/14/19 for chest pain and dyspnea. She ruled out with two negative high sensitivity troponins and no acute findings on EKG. Her O2 saturation was 100% on room air. Dr. Nishan  evaluated her in the ER and felt she was stable for discharge with close cardiology follow up - possible outpatient CT coronary if symptoms persist. ° °She was seen on March 26, 2019 by Jesse Cleaver, NP for ER follow-up.  She continued to have chest pain and dyspnea with exertion.  She was sleeping in a mechanical bed and states that the head is elevated and that she noticed that she sleeps better the higher the head of the bed is.  She continues to be active and dance in her house however, she states that she notices now she is only able to do about 15 minutes of activity at a time before she has to stop and take a break.    In the past she had been prescribed an albuterol inhaler by her primary physician which improved her shortness of breath.   ° °She was  referred by Jesse Cleaver, NP for a CT coronary angio which was done in April 07, 2019.  This showed mild coronary calcium elevation at 92 which was 58th percentile for age and sex matched control.  She had mild nonobstructive CAD with 25 to 49% stenosis in the proximal LAD, ramus and circumflex vessels. ° °Presently, she feels improved.  She still experiences occasional sharp nonexertional chest pain which may last 10 minutes.  She admits to being short of breath most of the time.  She is followed by Betty Reese. ° °Past Medical History:  °Diagnosis Date  °• Anxiety   °• Bipolar disorder (HCC)   °• Chest pain   ° a. 01/2003 MV w/ ? ant-inflat ischemia-->Cath: LAD 20p, otw   nonobs dzs-->Med Rx; b. 2009 Cath: LAD 30, LCX nl/nondominant, RCA 30, nl EF; c. 08/2014 MV: no ischemia/infarct.  °• Complication of anesthesia   ° pt. states she is difficult to wake up  °• Depression   °• Diabetes mellitus   ° Type II  °• Diabetic neuropathy (HCC)   °• Diverticulitis   °• GERD (gastroesophageal reflux disease)   °• Headache   °• History of bronchitis   °• Hypercholesteremia   °• Hypertension   °• Hypokalemia   °• PONV (postoperative nausea and vomiting)   °• Shortness of breath dyspnea   °• Sleep apnea   °• Vertigo   ° patient reported  ° ° °Past Surgical History:  °Procedure Laterality Date  °• ABDOMINAL HYSTERECTOMY    °• APPENDECTOMY    °• CARDIAC CATHETERIZATION  2009  ° normal cath  °• CARDIAC CATHETERIZATION  2004  ° normal cath  °• CHOLECYSTECTOMY    °• TONSILLECTOMY    °• TONSILLECTOMY/ADENOIDECTOMY/TURBINATE REDUCTION Bilateral 03/01/2015  ° Procedure: TONSILLECTOMY/TURBINATE REDUCTION;  Surgeon: Christopher E Newman, MD;  Location: [facility] OR;  Service: ENT;  Laterality: Bilateral;  °• UVULOPALATOPHARYNGOPLASTY Bilateral 03/01/2015  ° Procedure: UVULOPALATOPHARYNGOPLASTY (UPPP);  Surgeon: Christopher E Newman, MD;  Location: [facility] OR;  Service: ENT;  Laterality: Bilateral;  °• WISDOM TOOTH EXTRACTION    ° ° °Allergies  °Allergen  Reactions  °• Other Other (See Comments)  °  No Blood Products, personal preference  °• Latex Itching and Rash  ° ° °Current Outpatient Medications  °Medication Sig Dispense Refill  °• ACCU-CHEK SMARTVIEW test strip USE AS INSTRUCTED TO CHECK SUGAR 2 TIMES DAILY 200 strip 5  °• amLODipine (NORVASC) 5 MG tablet TAKE 1 TABLET (5 MG TOTAL) BY MOUTH DAILY. 90 tablet 3  °• aspirin EC 81 MG tablet Take 81 mg by mouth daily.    °• atorvastatin (LIPITOR) 20 MG tablet Take 20 mg by mouth daily.    °• b complex vitamins capsule Take 1 capsule by mouth daily.    °• Blood Glucose Monitoring Suppl (ACCU-CHEK NANO SMARTVIEW) w/Device KIT USE TO CHECK SUGAR DAILY. 1 kit 0  °• celecoxib (CELEBREX) 200 MG capsule Take 200 mg by mouth daily as needed.     °• diclofenac sodium (VOLTAREN) 1 % GEL Apply 2 g topically 4 (four) times daily as needed for pain.    °• glipiZIDE (GLUCOTROL XL) 5 MG 24 hr tablet Take 1 tablet (5 mg total) by mouth 2 (two) times daily. 180 tablet 3  °• Insulin Pen Needle 32G X 4 MM MISC USE EVERY DAY WITH DIABETIC INJECTABLES 100 each 2  °• liraglutide (VICTOZA) 18 MG/3ML SOPN INJECT 0.3 MLS (1.8 MG TOTAL) INTO THE SKIN DAILY. NEED APPOINTMENT 27 pen 2  °• Multiple Vitamin (MULTIVITAMIN) capsule Take 1 capsule by mouth daily.    °• pregabalin (LYRICA) 50 MG capsule Take 50 mg by mouth 3 (three) times daily as needed (nerve pain).     °• spironolactone (ALDACTONE) 25 MG tablet Take 0.5 tablets (12.5 mg total) by mouth as needed (FOR SWELLING). 90 tablet 1  °• valACYclovir (VALTREX) 500 MG tablet Take 500 mg by mouth daily as needed (flare up).     °• metoprolol succinate (TOPROL XL) 25 MG 24 hr tablet Take 1 tablet (25 mg total) by mouth daily. 90 tablet 3  ° °No current facility-administered medications for this visit.   ° ° °Socially she is divorced.  She quit tobacco use in 1986.  She does   drink occasional alcohol.  She does not routinely exercise.  Family History  Problem Relation Age of Onset    Diabetes Mother    Gout Mother    Hypertension Mother    Arthritis Mother    Heart disease Father    Heart failure Sister    Arthritis Sister    Heart disease Maternal Grandmother    Diabetes Maternal Grandmother    Heart disease Paternal Grandmother    Diabetes Paternal Grandmother    Diabetes Sister    Bursitis Sister    Crohn's disease Sister    Family history is notable that her mother is alive at 77 and has diabetes mellitus, gout, and hypertension.  Father died at age 28 with heart failure and had a pacemaker.  One brother also has a pacemaker.  One sister has congestive heart failure.  Another sister has diabetes mellitus.  Another sister has Crohn's disease.  ROS General: Negative; No fevers, chills, or night sweats HEENT: Negative; No changes in vision or hearing, sinus congestion, difficulty swallowing Pulmonary: Negative; No cough, wheezing, shortness of breath, hemoptysis Cardiovascular:  See HPI; No anginal chest pain, presyncope, syncope, palpitations, edema GI: Positive for GERD on omeprazole; No nausea, vomiting, diarrhea, or abdominal pain GU: Negative; No dysuria, hematuria, or difficulty voiding Musculoskeletal: Negative; no myalgias, joint pain, or weakness Hematologic/Oncologic: Negative; no easy bruising, bleeding Endocrine: Negative; no heat/cold intolerance; no diabetes Neuro: Positive for peripheral neuropathy on gabapentin Skin: Negative; No rashes or skin lesions Psychiatric: Negative; No behavioral problems, depression Sleep: Positive for obstructive sleep apnea not using CPAP therapy with mild daytime sleepiness, hypersomnolence; no bruxism, restless legs, hypnogagnic hallucinations Other comprehensive 14 point system review is negative   Physical Exam BP 122/79    Pulse 83    Temp (!) 95.4 F (35.2 C)    Ht 5' 6" (1.676 m)    Wt 187 lb 12.8 oz (85.2 kg)    SpO2 97%    BMI 30.31 kg/m    Pete blood pressure by me was 118/78  Wt  Readings from Last 3 Encounters:  07/02/19 187 lb 12.8 oz (85.2 kg)  04/01/19 187 lb (84.8 kg)  03/26/19 188 lb 9.6 oz (85.5 kg)   General: Alert, oriented, no distress.  Skin: normal turgor, no rashes, warm and dry HEENT: Normocephalic, atraumatic. Pupils equal round and reactive to light; sclera anicteric; extraocular muscles intact;  Nose without nasal septal hypertrophy Mouth/Parynx benign; Mallinpatti scale 3 Neck: No JVD, no carotid bruits; normal carotid upstroke Lungs: clear to ausculatation and percussion; no wheezing or rales Chest wall: without tenderness to palpitation Heart: PMI not displaced, RRR, s1 s2 normal, 1/6 systolic murmur, no diastolic murmur, no rubs, gallops, thrills, or heaves Abdomen: soft, nontender; no hepatosplenomehaly, BS+; abdominal aorta nontender and not dilated by palpation. Back: no CVA tenderness Pulses 2+ Musculoskeletal: full range of motion, normal strength, no joint deformities Extremities: no clubbing cyanosis or edema, Homan's sign negative  Neurologic: grossly nonfocal; Cranial nerves grossly wnl Psychologic: Normal mood and affect   ECG (independently read by me): NSR at 85, RBBB with repolarization changes; QTc 485 msec  March 2020 ECG (independently read by me): Normal sinus rhythm at 89 bpm, fusion complexes.  Right bundle branch block with repolarization changes.  July 2019 ECG (independently read by me): Sinus rhythm at 78 bpm.  Right bundle branch block with repolarization changes.  No ectopy.  QTc interval 453 ms  February 2018 ECG (independently read by me): Normal sinus rhythm  at 81 bpm.  QRS complex V1 and V2.  Normal intervals.  January 2017 ECG (independently read by me):  Normal sinus rhythm with a PAC.  QTc interval 422  January 2016 ECG (independently read by me): Normal sinus rhythm at 77 bpm.  QRS complex V1 V2; nondiagnostic T changes anterolaterally.  QTc interval 454 ms  LABS:  BMP Latest Ref Rng & Units 03/14/2019  10/23/2016 05/26/2016  Glucose 70 - 99 mg/dL 113(H) 117(H) 129(H)  BUN 8 - 23 mg/dL 6(L) 15 7  Creatinine 0.44 - 1.00 mg/dL 0.79 0.81 0.65  Sodium 135 - 145 mmol/L 139 142 139  Potassium 3.5 - 5.1 mmol/L 4.2 4.6 3.7  Chloride 98 - 111 mmol/L 103 105 105  CO2 22 - 32 mmol/L _0 Calcium 8.9 - 10.3 mg/dL 9.8 10.0 8.7(L)   Hepatic Function Latest Ref Rng & Units 10/23/2016 05/25/2016 02/29/2016  Total Protein 6.5 - 8.1 g/dL 8.7(H) 7.1 7.2  Albumin 3.5 - 5.0 g/dL 4.5 3.8 4.0  AST 15 - 41 U/L _1 ALT 14 - 54 U/L 22 40 20  Alk Phosphatase 38 - 126 U/L 102 106 99  Total Bilirubin 0.3 - 1.2 mg/dL 0.6 0.3 0.4  Bilirubin, Direct <=0.2 mg/dL - - 0.1   CBC Latest Ref Rng & Units 03/14/2019 10/23/2016 05/25/2016  WBC 4.0 - 10.5 K/uL 5.2 7.3 6.5  Hemoglobin 12.0 - 15.0 g/dL 14.9 14.7 14.6  Hematocrit 36.0 - 46.0 % 46.3(H) 43.4 43.4  Platelets 150 - 400 K/uL 213 224 171   Lab Results  Component Value Date   MCV 86.7 03/14/2019   MCV 83.0 10/23/2016   MCV 82.8 05/25/2016   Lab Results  Component Value Date   TSH 0.835 08/30/2015   Lab Results  Component Value Date   HGBA1C 7.7 (A) 04/01/2019   Lipid Panel     Component Value Date/Time   CHOL 141 02/29/2016 0856   TRIG 94 02/29/2016 0856   HDL 67 02/29/2016 0856   CHOLHDL 2.1 02/29/2016 0856   VLDL 19 02/29/2016 0856   LDLCALC 55 02/29/2016 0856    RADIOLOGY: No results found.  IMPRESSION: 1. Essential hypertension   2. Hyperlipidemia LDL goal <70   3. CAD in native artery   4. Sleep apnea, unspecified type   5. Type 2 diabetes mellitus without complication, with long-term current use of insulin ([place])   6. Mild obesity     ASSESSMENT AND PLAN: Bonnie Gordon is a 63 year old African-American female who has previously been documented have mild CAD at initial catheterization in 2004 and subsequent cardiac catheterization in 2009.  She had developed diabetes mellitus, hypertension, obstructive sleep apnea and  peripheral neuropathy.   A follow-up sleep study  in [place] [facility] was done after she had not been utilizing CPAP for several years.  On this study she was not found to have significant overall sleep apnea but there was minimal sleep apnea noted with supine sleep and very mild sleep apnea during REM sleep.  Oxygen desaturated to 83%.  I suspect she still has sleep apnea, unspecified type with increased upper airway resistance have recommended to her that future evaluation may be necessary depending upon symptoms status.  Inside last saw her, she apparently is off beta-blocker therapy.  She has not experienced any edema.  Her resting pulse is in the mid to upper 80s.  I have recommended resumption of low-dose Toprol-XL at 25  mg.  Have suggested that she change her spironolactone from daily to as needed depending upon edema and only take 12.5 mg rather than 25 mg.  Her last echo Doppler study 2016 had shown an EF of 55 to 60% no wall motion abnormalities.  She is diabetic on glipizide, Levemir and Victoza.  She continues to be on atorvastatin 20 mg.  She has not had recent lipid studies.  I have suggested a follow-up chemistry profile and lipid study.  Target LDL is less than 70 in this diabetic female.  She will be seeing Dr. Jackson Latino for primary care and Dr. Eino Farber for her diabetes.  I will see her in 6 months for reevaluation.  Time spent: 25 minutes  Troy Sine, MD, [facility] 07/04/2019 5:30 PM

## 2019-07-02 NOTE — Patient Instructions (Signed)
Medication Instructions:  Your physician has recommended you make the following change in your medication:  STOP METOPROLOL TARTRATE 100 MG  START METOPROLOL SUCCINATE (TOPROL XL) 25 MG BY MOUTH DAILY  START TAKING HALF A TABLET OF SPIRONOLACTONE (ALDACTONE) ONLY AS NEEDED FOR SWELLING  *If you need a refill on your cardiac medications before your next appointment, please call your pharmacy*  Lab Work: Your physician recommends that you return for lab work in 2 WEEKS: [place]; [place]  If you have labs (blood work) drawn today and your tests are completely normal, you will receive your results only by: Marland Kitchen MyChart Message (if you have MyChart) OR . A paper copy in the mail If you have any lab test that is abnormal or we need to change your treatment, we will call you to review the results.  Testing/Procedures: NONE  Follow-Up: At [facility], you and your health needs are our priority.  As part of our continuing mission to provide you with exceptional heart care, we have created designated Provider Care Teams.  These Care Teams include your primary Cardiologist (physician) and Advanced Practice Providers (APPs -  Physician Assistants and Nurse Practitioners) who all work together to provide you with the care you need, when you need it.  Your next appointment:   6 month(s)  The format for your next appointment:   In Person  Provider:   You may see Shelva Majestic, MD or one of the following Advanced Practice Providers on your designated Care Team:    Almyra Deforest, PA-C  Fabian Sharp, PA-C or   Roby Lofts, [place]

## 2019-07-04 ENCOUNTER — Encounter: Payer: Self-pay | Admitting: Cardiovascular Disease

## 2019-07-25 ENCOUNTER — Other Ambulatory Visit: Payer: Self-pay | Admitting: Internal Medicine

## 2019-07-25 ENCOUNTER — Other Ambulatory Visit: Payer: Self-pay

## 2019-07-29 ENCOUNTER — Other Ambulatory Visit: Payer: Self-pay | Admitting: Cardiovascular Disease

## 2019-07-29 ENCOUNTER — Other Ambulatory Visit: Payer: Self-pay

## 2019-07-29 ENCOUNTER — Encounter: Payer: Self-pay | Admitting: Internal Medicine

## 2019-07-29 ENCOUNTER — Ambulatory Visit (INDEPENDENT_AMBULATORY_CARE_PROVIDER_SITE_OTHER): Payer: Medicare HMO | Admitting: Internal Medicine

## 2019-07-29 VITALS — BP 130/86 | HR 80 | Ht 66.0 in | Wt 187.0 lb

## 2019-07-29 DIAGNOSIS — E785 Hyperlipidemia, unspecified: Secondary | ICD-10-CM

## 2019-07-29 DIAGNOSIS — E669 Obesity, unspecified: Secondary | ICD-10-CM | POA: Diagnosis not present

## 2019-07-29 DIAGNOSIS — E1159 Type 2 diabetes mellitus with other circulatory complications: Secondary | ICD-10-CM

## 2019-07-29 DIAGNOSIS — E1165 Type 2 diabetes mellitus with hyperglycemia: Secondary | ICD-10-CM | POA: Diagnosis not present

## 2019-07-29 LAB — POCT GLYCOSYLATED HEMOGLOBIN (HGB A1C): Hemoglobin A1C: 7.1 % — AB (ref 4.0–5.6)

## 2019-07-29 MED ORDER — GLIPIZIDE ER 5 MG PO TB24: 5.0000 mg | ORAL_TABLET | Freq: Two times a day (BID) | ORAL | 3 refills | Status: DC

## 2019-07-29 NOTE — Progress Notes (Signed)
Patient ID: Bonnie Gordon, female   DOB: 1955-12-31, 63 y.o.   MRN: [id number]  This visit occurred during the SARS-CoV-2 public health emergency.  Safety protocols were in place, including screening questions prior to the visit, additional usage of staff PPE, and extensive cleaning of exam room while observing appropriate contact time as indicated for disinfecting solutions.   HPI: Bonnie Gordon is a 63 y.o.-year-old female, presenting for follow-up for DM2, dx in 2012, insulin-independent, uncontrolled, with complications (CAD, PN). Last visit 4 months ago.  At last visit she relaxed her diet and eating more fast foods.  Sugars were higher.   Reviewed HbA1c levels: Lab Results  Component Value Date   HGBA1C 7.7 (A) 04/01/2019   HGBA1C 7.5 (A) 07/15/2018   HGBA1C 8.1 08/24/2017   HGBA1C 6.8 04/25/2017   HGBA1C 6.5 01/18/2017   HGBA1C 6.2 10/18/2016   HGBA1C 7.5 07/20/2016   HGBA1C 9.5 04/20/2016   HGBA1C 8.1 (H) 08/30/2015   HGBA1C 7.1 (H) 02/25/2015   HGBA1C 7.0 (H) 08/26/2014   HGBA1C (H) 09/16/2009    6.5 (NOTE) The ADA recommends the following therapeutic goal for glycemic control related to Hgb A1c measurement: Goal of therapy: <6.5 Hgb A1c  Reference: American Diabetes Association: Clinical Practice Recommendations 2010, Diabetes Care, 2010, 33: (Suppl  1).  11/2018: HbA1c 7.2% 01/14/2016: HbA1c 11.3%  Patient is on: - Glipizide ER 5 mg before breakfast and dinner - Victoza 1.8 mg at night >> moved to am She was also on: - Levemir up to 60 units at bedtime-stopped 11/2018 - Glipizide 5 mg before dinner >> stopped  - Glimepiride 4 mg before b'fast/ Glipizide XL 10 mg in am  - Tradjenta 5 mg in am  These were stopped when she started insulin. She was on Metformin >> diarrhea.  Pt checks her sugars twice a day: - am: 96-120 >> <120 >> 96, 118-132, 184 >> 94, 100-120, 137 - 2h after b'fast: 127 >> n/c >> 126 >> n/c - before lunch: 100, 120, 153 >> n/c >> 64 >> n/c - 2h  after lunch: n/c >> 167 (cake) >> n/c - before dinner: 68, 113 >> 80-120 >> n/c >> 130 >> n/c - 2h after dinner: n/c >> 196 x1 >> n/c - bedtime:  140-152 >> 120 >> 120s >> 120 >> 115-120, 160 - nighttime: n/c Lowest sugar was 64 >> 92 >> 96 >> 94; it is unclear at which level she has hypoglycemia awareness. Highest sugar was 300 x1 >> 180 >> 167 >> 184 (lemonade) >> 160.  Glucometer: AccuChek Aviva  Meals: fish  - fried - once a month when she eats out; no red meat.  Salads with ranch dressing.  -No CKD, last BUN/creatinine: Lab Results  Component Value Date   BUN 6 (L) 03/14/2019   BUN 15 10/23/2016   CREATININE 0.79 03/14/2019   CREATININE 0.81 10/23/2016  01/13/2016: 12/0.83, glucose 472  -+ HL; last set of lipids: Lab Results  Component Value Date   CHOL 141 02/29/2016   HDL 67 02/29/2016   LDLCALC 55 02/29/2016   TRIG 94 02/29/2016   CHOLHDL 2.1 02/29/2016  01/13/2016: 147/218/73/69 On atorvastatin changed before last visit from [facility].  Unclear why the change was made, but I do not have other lipid panels beyond 2017.  - last eye exam was in 06/2019: Reportedly no DR; + incipient cataract. Started to see [facility]. - She has numbness and tingling in her feet.  She sees podiatry.  Previously on Neurontin.  I suggested alpha-lipoic acid and B complex in the past.  Latest TSH was reviewed and this was normal: 01/13/2016: TSH 1.39 Lab Results  Component Value Date   TSH 0.835 08/30/2015   She was admitted with CP 05/2016 (found to have hypokalemia, mm cramp).  She has mild nonobstructive CAD.  She will be a trial for study for nausea in pts with DM. ROS: Constitutional: no weight gain/no weight loss, no fatigue, no subjective hyperthermia, no subjective hypothermia Eyes: no blurry vision, no xerophthalmia ENT: no sore throat, no nodules palpated in neck, no dysphagia, no odynophagia, no hoarseness Cardiovascular: no CP/no SOB/no palpitations/no leg  swelling Respiratory: no cough/no SOB/no wheezing Gastrointestinal: + N/no V/no D/no C/no acid reflux Musculoskeletal: no muscle aches/no joint aches Skin: no rashes, no hair loss Neurological: no tremors/+ numbness/+ tingling/no dizziness  I reviewed pt's medications, allergies, PMH, social hx, family hx, and changes were documented in the history of present illness. Otherwise, unchanged from my initial visit note.  Past Medical History:  Diagnosis Date  . Anxiety   . Bipolar disorder ([place])   . Chest pain    a. 01/2003 MV w/ ? ant-inflat ischemia-->Cath: LAD 20p, otw nonobs dzs-->Med Rx; b. 2009 Cath: LAD 30, LCX nl/nondominant, RCA 30, nl EF; c. 08/2014 MV: no ischemia/infarct.  . Complication of anesthesia    pt. states she is difficult to wake up  . Depression   . Diabetes mellitus    Type II  . Diabetic neuropathy ([place])   . Diverticulitis   . GERD (gastroesophageal reflux disease)   . Headache   . History of bronchitis   . Hypercholesteremia   . Hypertension   . Hypokalemia   . PONV (postoperative nausea and vomiting)   . Shortness of breath dyspnea   . Sleep apnea   . Vertigo    patient reported   Past Surgical History:  Procedure Laterality Date  . ABDOMINAL HYSTERECTOMY    . APPENDECTOMY    . CARDIAC CATHETERIZATION  2009   normal cath  . CARDIAC CATHETERIZATION  2004   normal cath  . CHOLECYSTECTOMY    . TONSILLECTOMY    . TONSILLECTOMY/ADENOIDECTOMY/TURBINATE REDUCTION Bilateral 03/01/2015   Procedure: TONSILLECTOMY/TURBINATE REDUCTION;  Surgeon: Rozetta Nunnery, MD;  Location: [facility];  Service: ENT;  Laterality: Bilateral;  . UVULOPALATOPHARYNGOPLASTY Bilateral 03/01/2015   Procedure: UVULOPALATOPHARYNGOPLASTY (UPPP);  Surgeon: Rozetta Nunnery, MD;  Location: [facility];  Service: ENT;  Laterality: Bilateral;  . WISDOM TOOTH EXTRACTION     Social History   Social History  . Marital status: Divorced    Spouse name: N/A  . Number of children: 2    Occupational History  . Disability    Social History Main Topics  . Smoking status: Former Smoker    Quit date: 08/07/1982  . Smokeless tobacco: Never Used  . Alcohol use Yes     Comment: Drinks during the weekends 1 beer or 1 drink of liquor per day   . Drug use: No  . Sexual activity: Yes    Partners: Male    Birth control/ protection: Surgical   Current Outpatient Medications on File Prior to Visit  Medication Sig Dispense Refill  . ACCU-CHEK SMARTVIEW test strip USE AS INSTRUCTED TO CHECK SUGAR 2 TIMES DAILY 200 strip 5  . amLODipine (NORVASC) 5 MG tablet TAKE 1 TABLET (5 MG TOTAL) BY MOUTH DAILY. 90 tablet 3  . aspirin EC 81 MG tablet Take 81 mg by mouth daily.    Marland Kitchen  atorvastatin (LIPITOR) 20 MG tablet Take 20 mg by mouth daily.    Marland Kitchen b complex vitamins capsule Take 1 capsule by mouth daily.    . Blood Glucose Monitoring Suppl (ACCU-CHEK NANO SMARTVIEW) w/Device KIT USE TO CHECK SUGAR DAILY. 1 kit 0  . celecoxib (CELEBREX) 200 MG capsule Take 200 mg by mouth daily as needed.     . diclofenac sodium (VOLTAREN) 1 % GEL Apply 2 g topically 4 (four) times daily as needed for pain.    Marland Kitchen glipiZIDE (GLUCOTROL XL) 5 MG 24 hr tablet Take 1 tablet (5 mg total) by mouth 2 (two) times daily. 180 tablet 3  . Insulin Pen Needle 32G X 4 MM MISC USE EVERY DAY WITH DIABETIC INJECTABLES 100 each 2  . liraglutide (VICTOZA) 18 MG/3ML SOPN INJECT 0.3 MLS (1.8 MG TOTAL) INTO THE SKIN DAILY. NEED APPOINTMENT 27 pen 2  . metoprolol succinate (TOPROL XL) 25 MG 24 hr tablet Take 1 tablet (25 mg total) by mouth daily. 90 tablet 3  . Multiple Vitamin (MULTIVITAMIN) capsule Take 1 capsule by mouth daily.    . pregabalin (LYRICA) 50 MG capsule Take 50 mg by mouth 3 (three) times daily as needed (nerve pain).     Marland Kitchen spironolactone (ALDACTONE) 25 MG tablet Take 0.5 tablets (12.5 mg total) by mouth as needed (FOR SWELLING). 90 tablet 1  . valACYclovir (VALTREX) 500 MG tablet Take 500 mg by mouth daily as needed  (flare up).      No current facility-administered medications on file prior to visit.   Allergies  Allergen Reactions  . Other Other (See Comments)    No Blood Products, personal preference  . Latex Itching and Rash   Family History  Problem Relation Age of Onset  . Diabetes Mother   . Gout Mother   . Hypertension Mother   . Arthritis Mother   . Heart disease Father   . Heart failure Sister   . Arthritis Sister   . Heart disease Maternal Grandmother   . Diabetes Maternal Grandmother   . Heart disease Paternal Grandmother   . Diabetes Paternal Grandmother   . Diabetes Sister   . Bursitis Sister   . Crohn's disease Sister    PE: BP 130/86   Pulse 80   Ht 5' 6"  (1.676 m)   Wt 187 lb (84.8 kg)   SpO2 98%   BMI 30.18 kg/m  Wt Readings from Last 3 Encounters:  07/29/19 187 lb (84.8 kg)  07/02/19 187 lb 12.8 oz (85.2 kg)  04/01/19 187 lb (84.8 kg)   Constitutional: overweight, in NAD Eyes: PERRLA, EOMI, no exophthalmos ENT: moist mucous membranes, no thyromegaly, no cervical lymphadenopathy Cardiovascular: RRR, No MRG Respiratory: CTA B Gastrointestinal: abdomen soft, NT, ND, BS+ Musculoskeletal: no deformities, strength intact in all 4 Skin: moist, warm, no rashes Neurological: no tremor with outstretched hands, DTR normal in all 4  ASSESSMENT: 1. DM2, insulin-independent now, uncontrolled, with complications - CAD - PN  2. HL  3.  Obesity class I  PLAN:  1. Patient with longstanding, uncontrolled, type 2 diabetes, with improved control more recently so she could come off insulin. -At last visit, sugars are higher in the morning but still at or close to goal.  They did not increase above target throughout the day.  However, she was taking Victoza at bedtime and we moved this to a.m.  We did not change her regimen otherwise we discussed about the importance of cutting out fatty foods from  her diet. -At this visit, sugars are at goal with only few mildly  hyperglycemic exceptions.  She feels that moving because in the morning helped.  No need to change the regimen now.  Refilled her glipizide. - I suggested to:  Patient Instructions  Please  continue: - Glipizide ER 5 mg before breakfast and dinner - Victoza 1.8 mg in a.m.  Please return in 4 months with your sugar log.   - we checked her HbA1c: 7.1% (better) - advised to check sugars at different times of the day - 1x a day, rotating check times - advised for yearly eye exams >> she is UTD - return to clinic in 4 months   2. HL -Reviewed latest lipid panel from 2017: At goal, but changed to statins afterwards per PCPs advise Lab Results  Component Value Date   CHOL 141 02/29/2016   HDL 67 02/29/2016   LDLCALC 55 02/29/2016   TRIG 94 02/29/2016   CHOLHDL 2.1 02/29/2016  -Continues on Lipitor without side effects. - Because of her heart disease, target LDL is less than 70. -She needs a lipid panel but she had labs drawn today >> BMP and Lipids  3.  Obesity class I -Continue GLP-1 receptor agonist which should also help with weight loss -Before last visit, she lost 6 pounds in the previous 8 months -Weight stable since last visit  Philemon Kingdom, MD PhD [facility] Endocrinology

## 2019-07-29 NOTE — Patient Instructions (Signed)
Please  continue: - Glipizide ER 5 mg before breakfast and dinner - Victoza 1.8 mg in a.m.  Please return in 4 months with your sugar log.

## 2019-07-30 LAB — BASIC METABOLIC PANEL
BUN/Creatinine Ratio: 8 — ABNORMAL LOW (ref 10–24)
BUN: 6 mg/dL — ABNORMAL LOW (ref 8–27)
CO2: 22 mmol/L (ref 20–29)
Calcium: 9.8 mg/dL (ref 8.6–10.2)
Chloride: 104 mmol/L (ref 96–106)
Creatinine, Ser: 0.72 mg/dL — ABNORMAL LOW (ref 0.76–1.27)
GFR calc Af Amer: 115 mL/min/{1.73_m2} (ref >59)
GFR calc non Af Amer: 99 mL/min/{1.73_m2} (ref >59)
Glucose: 129 mg/dL — ABNORMAL HIGH (ref 65–99)
Potassium: 4.4 mmol/L (ref 3.5–5.2)
Sodium: 144 mmol/L (ref 134–144)

## 2019-07-30 LAB — LIPID PANEL
Chol/HDL Ratio: 2.6 ratio (ref 0.0–5.0)
Cholesterol, Total: 148 mg/dL (ref 100–199)
HDL: 56 mg/dL (ref >39)
LDL Chol Calc (NIH): 65 mg/dL (ref 0–99)
Triglycerides: 160 mg/dL — ABNORMAL HIGH (ref 0–149)
VLDL Cholesterol Cal: 27 mg/dL (ref 5–40)

## 2019-09-07 ENCOUNTER — Encounter (HOSPITAL_COMMUNITY): Payer: Self-pay

## 2019-09-07 ENCOUNTER — Other Ambulatory Visit: Payer: Self-pay

## 2019-09-07 ENCOUNTER — Ambulatory Visit (HOSPITAL_COMMUNITY)
Admission: EM | Admit: 2019-09-07 | Discharge: 2019-09-07 | Disposition: A | Discharge status: Home / Self Care | Payer: Medicare HMO | Attending: Family Medicine | Admitting: Family Medicine

## 2019-09-07 DIAGNOSIS — R05 Cough: Secondary | ICD-10-CM

## 2019-09-07 DIAGNOSIS — Z833 Family history of diabetes mellitus: Secondary | ICD-10-CM | POA: Insufficient documentation

## 2019-09-07 DIAGNOSIS — Z8249 Family history of ischemic heart disease and other diseases of the circulatory system: Secondary | ICD-10-CM | POA: Diagnosis not present

## 2019-09-07 DIAGNOSIS — R059 Cough, unspecified: Secondary | ICD-10-CM

## 2019-09-07 DIAGNOSIS — Z91040 Latex allergy status: Secondary | ICD-10-CM | POA: Insufficient documentation

## 2019-09-07 DIAGNOSIS — Z87891 Personal history of nicotine dependence: Secondary | ICD-10-CM | POA: Diagnosis not present

## 2019-09-07 DIAGNOSIS — G4730 Sleep apnea, unspecified: Secondary | ICD-10-CM | POA: Insufficient documentation

## 2019-09-07 DIAGNOSIS — E1140 Type 2 diabetes mellitus with diabetic neuropathy, unspecified: Secondary | ICD-10-CM | POA: Insufficient documentation

## 2019-09-07 DIAGNOSIS — B9789 Other viral agents as the cause of diseases classified elsewhere: Secondary | ICD-10-CM | POA: Diagnosis not present

## 2019-09-07 DIAGNOSIS — Z79899 Other long term (current) drug therapy: Secondary | ICD-10-CM | POA: Insufficient documentation

## 2019-09-07 DIAGNOSIS — R062 Wheezing: Secondary | ICD-10-CM

## 2019-09-07 DIAGNOSIS — U071 COVID-19: Secondary | ICD-10-CM | POA: Insufficient documentation

## 2019-09-07 DIAGNOSIS — J069 Acute upper respiratory infection, unspecified: Secondary | ICD-10-CM | POA: Diagnosis not present

## 2019-09-07 DIAGNOSIS — E7800 Pure hypercholesterolemia, unspecified: Secondary | ICD-10-CM | POA: Diagnosis not present

## 2019-09-07 DIAGNOSIS — Z8379 Family history of other diseases of the digestive system: Secondary | ICD-10-CM | POA: Insufficient documentation

## 2019-09-07 DIAGNOSIS — Z7982 Long term (current) use of aspirin: Secondary | ICD-10-CM | POA: Insufficient documentation

## 2019-09-07 DIAGNOSIS — K219 Gastro-esophageal reflux disease without esophagitis: Secondary | ICD-10-CM | POA: Insufficient documentation

## 2019-09-07 DIAGNOSIS — E119 Type 2 diabetes mellitus without complications: Secondary | ICD-10-CM

## 2019-09-07 DIAGNOSIS — Z7984 Long term (current) use of oral hypoglycemic drugs: Secondary | ICD-10-CM | POA: Diagnosis not present

## 2019-09-07 DIAGNOSIS — I10 Essential (primary) hypertension: Secondary | ICD-10-CM | POA: Diagnosis not present

## 2019-09-07 MED ORDER — BENZONATATE 100 MG PO CAPS: 100.0000 mg | ORAL_CAPSULE | Freq: Three times a day (TID) | ORAL | 0 refills | Status: DC | PRN

## 2019-09-07 MED ORDER — PROMETHAZINE-DM 6.25-15 MG/5ML PO SYRP: 5.0000 mL | ORAL_SOLUTION | Freq: Every evening | ORAL | 0 refills | Status: DC | PRN

## 2019-09-07 NOTE — Discharge Instructions (Signed)
We will manage this as a viral syndrome. For sore throat or cough try using a honey-based tea. Use 3 teaspoons of honey with juice squeezed from half lemon. Place shaved pieces of ginger into 1/2-1 cup of water and warm over stove top. Then mix the ingredients and repeat every 4 hours as needed. Please take Tylenol 500mg every 6 hours. Hydrate very well with at least 2 liters of water. Eat light meals such as soups to replenish electrolytes and soft fruits, veggies. Start an antihistamine like Zyrtec (cetirizine) at 10mg daily for postnasal drainage, sinus congestion.  You can take this together with pseudoephedrine (Sudafed) at a dose of 60 mg 3 times a day or twice daily as needed for the same kind of congestion.    

## 2019-09-07 NOTE — ED Provider Notes (Signed)
[facility]   MRN: [id number] DOB: 1956-02-17  Subjective:   Bonnie Gordon is a 64 y.o. female presenting for 1 day history of acute onset mild to moderate dry cough with occasional wheezing and a headache yesterday.  Patient denies history of asthma, is not a smoker. Denies direct COVID 19 contacts/exposure.  She is much more compliant with her diabetes and high blood pressure.   No current facility-administered medications for this encounter.  Current Outpatient Medications:  .  Pseudoephedrine-APAP-DM (DAYQUIL PO), Take by mouth., Disp: , Rfl:  .  ACCU-CHEK SMARTVIEW test strip, USE AS INSTRUCTED TO CHECK SUGAR 2 TIMES DAILY, Disp: 200 strip, Rfl: 5 .  amLODipine (NORVASC) 5 MG tablet, TAKE 1 TABLET (5 MG TOTAL) BY MOUTH DAILY., Disp: 90 tablet, Rfl: 3 .  aspirin EC 81 MG tablet, Take 81 mg by mouth daily., Disp: , Rfl:  .  atorvastatin (LIPITOR) 20 MG tablet, Take 20 mg by mouth daily., Disp: , Rfl:  .  b complex vitamins capsule, Take 1 capsule by mouth daily., Disp: , Rfl:  .  Blood Glucose Monitoring Suppl (ACCU-CHEK NANO SMARTVIEW) w/Device KIT, USE TO CHECK SUGAR DAILY., Disp: 1 kit, Rfl: 0 .  celecoxib (CELEBREX) 200 MG capsule, Take 200 mg by mouth daily as needed. , Disp: , Rfl:  .  diclofenac sodium (VOLTAREN) 1 % GEL, Apply 2 g topically 4 (four) times daily as needed for pain., Disp: , Rfl:  .  glipiZIDE (GLUCOTROL XL) 5 MG 24 hr tablet, Take 1 tablet (5 mg total) by mouth 2 (two) times daily., Disp: 180 tablet, Rfl: 3 .  Insulin Pen Needle 32G X 4 MM MISC, USE EVERY DAY WITH DIABETIC INJECTABLES, Disp: 100 each, Rfl: 2 .  liraglutide (VICTOZA) 18 MG/3ML SOPN, INJECT 0.3 MLS (1.8 MG TOTAL) INTO THE SKIN DAILY. NEED APPOINTMENT, Disp: 27 pen, Rfl: 2 .  metoprolol succinate (TOPROL XL) 25 MG 24 hr tablet, Take 1 tablet (25 mg total) by mouth daily., Disp: 90 tablet, Rfl: 3 .  Multiple Vitamin (MULTIVITAMIN) capsule, Take 1 capsule by mouth daily., Disp: , Rfl:  .   pregabalin (LYRICA) 50 MG capsule, Take 50 mg by mouth 3 (three) times daily as needed (nerve pain). , Disp: , Rfl:  .  spironolactone (ALDACTONE) 25 MG tablet, Take 0.5 tablets (12.5 mg total) by mouth as needed (FOR SWELLING)., Disp: 90 tablet, Rfl: 1 .  valACYclovir (VALTREX) 500 MG tablet, Take 500 mg by mouth daily as needed (flare up). , Disp: , Rfl:    Allergies  Allergen Reactions  . Other Other (See Comments)    No Blood Products, personal preference  . Latex Itching and Rash    Past Medical History:  Diagnosis Date  . Anxiety   . Bipolar disorder ([place])   . Chest pain    a. 01/2003 MV w/ ? ant-inflat ischemia-->Cath: LAD 20p, otw nonobs dzs-->Med Rx; b. 2009 Cath: LAD 30, LCX nl/nondominant, RCA 30, nl EF; c. 08/2014 MV: no ischemia/infarct.  . Complication of anesthesia    pt. states she is difficult to wake up  . Depression   . Diabetes mellitus    Type II  . Diabetic neuropathy ([place])   . Diverticulitis   . GERD (gastroesophageal reflux disease)   . Headache   . History of bronchitis   . Hypercholesteremia   . Hypertension   . Hypokalemia   . PONV (postoperative nausea and vomiting)   . Shortness of breath dyspnea   .  Sleep apnea   . Vertigo    patient reported     Past Surgical History:  Procedure Laterality Date  . ABDOMINAL HYSTERECTOMY    . APPENDECTOMY    . CARDIAC CATHETERIZATION  2009   normal cath  . CARDIAC CATHETERIZATION  2004   normal cath  . CHOLECYSTECTOMY    . TONSILLECTOMY    . TONSILLECTOMY/ADENOIDECTOMY/TURBINATE REDUCTION Bilateral 03/01/2015   Procedure: TONSILLECTOMY/TURBINATE REDUCTION;  Surgeon: Rozetta Nunnery, MD;  Location: [facility];  Service: ENT;  Laterality: Bilateral;  . UVULOPALATOPHARYNGOPLASTY Bilateral 03/01/2015   Procedure: UVULOPALATOPHARYNGOPLASTY (UPPP);  Surgeon: Rozetta Nunnery, MD;  Location: [facility];  Service: ENT;  Laterality: Bilateral;  . WISDOM TOOTH EXTRACTION      Family History  Problem Relation Age  of Onset  . Diabetes Mother   . Gout Mother   . Hypertension Mother   . Arthritis Mother   . Heart disease Father   . Heart failure Sister   . Arthritis Sister   . Heart disease Maternal Grandmother   . Diabetes Maternal Grandmother   . Heart disease Paternal Grandmother   . Diabetes Paternal Grandmother   . Diabetes Sister   . Bursitis Sister   . Crohn's disease Sister     Social History   Tobacco Use  . Smoking status: Former Smoker    Quit date: 08/07/1982    Years since quitting: 37.1  . Smokeless tobacco: Never Used  Substance Use Topics  . Alcohol use: Yes    Comment: occasional / social  . Drug use: No    Review of Systems  Constitutional: Positive for malaise/fatigue. Negative for fever.  HENT: Negative for congestion, ear pain, sinus pain and sore throat.   Eyes: Negative for blurred vision, double vision, discharge and redness.  Respiratory: Positive for cough and wheezing. Negative for hemoptysis and shortness of breath.   Cardiovascular: Negative for chest pain.  Gastrointestinal: Negative for abdominal pain, diarrhea, nausea and vomiting.  Genitourinary: Negative for dysuria, flank pain and hematuria.  Musculoskeletal: Negative for myalgias.  Skin: Negative for rash.  Neurological: Positive for headaches. Negative for dizziness and weakness.  Psychiatric/Behavioral: Negative for depression and substance abuse.     Objective:   Vitals: BP 111/75 (BP Location: Right Arm)   Pulse 99   Temp 99.5 F (37.5 C) (Oral)   Resp 20   SpO2 95%   Physical Exam Constitutional:      General: She is not in acute distress.    Appearance: Normal appearance. She is well-developed. She is not ill-appearing, toxic-appearing or diaphoretic.  HENT:     Head: Normocephalic and atraumatic.     Nose: Nose normal.     Mouth/Throat:     Mouth: Mucous membranes are moist.     Pharynx: Oropharynx is clear.  Eyes:     General: No scleral icterus.    Extraocular  Movements: Extraocular movements intact.     Pupils: Pupils are equal, round, and reactive to light.  Cardiovascular:     Rate and Rhythm: Normal rate and regular rhythm.     Pulses: Normal pulses.     Heart sounds: Normal heart sounds. No murmur. No friction rub. No gallop.   Pulmonary:     Effort: Pulmonary effort is normal. No respiratory distress.     Breath sounds: Normal breath sounds. No stridor. No wheezing, rhonchi or rales.  Skin:    General: Skin is warm and dry.     Findings: No rash.  Neurological:     General: No focal deficit present.     Mental Status: She is alert and oriented to person, place, and time.  Psychiatric:        Mood and Affect: Mood normal.        Speech: Speech normal.        Behavior: Behavior normal.        Thought Content: Thought content normal.      Assessment and Plan :   1. Viral URI with cough   2. Cough   3. Wheezing   4. Well controlled diabetes mellitus ([place])   5. Essential hypertension     Patient is high risk due to her history of diabetes and high blood pressure.  If her COVID-19 testing is positive, will refer to the infusion clinic. Will manage for viral illness such as viral URI, viral rhinitis, possible COVID-19. Counseled patient on nature of COVID-19 including modes of transmission, diagnostic testing, management and supportive care.  Offered symptomatic relief. COVID 19 testing is pending. Counseled patient on potential for adverse effects with medications prescribed/recommended today, ER and return-to-clinic precautions discussed, patient verbalized understanding.     Jaynee Eagles, PA-C 09/07/19 1719

## 2019-09-07 NOTE — ED Triage Notes (Signed)
Pt presents to UC for COVID testing. Pt reports cough, wheezing and headache x 1 day. Dayquil and Tylenol give relief.

## 2019-09-08 LAB — NOVEL CORONAVIRUS, NAA (HOSP ORDER, SEND-OUT TO REF LAB; TAT 18-24 HRS): SARS-CoV-2, NAA: DETECTED — AB

## 2019-09-09 ENCOUNTER — Other Ambulatory Visit: Payer: Self-pay | Admitting: Nurse Practitioner

## 2019-09-09 ENCOUNTER — Ambulatory Visit (HOSPITAL_COMMUNITY)
Admission: RE | Admit: 2019-09-09 | Discharge: 2019-09-09 | Disposition: A | Discharge status: Home / Self Care | Payer: Medicare Other | Source: Ambulatory Visit | Attending: Pulmonary Disease | Admitting: Pulmonary Disease

## 2019-09-09 ENCOUNTER — Telehealth: Payer: Self-pay | Admitting: Nurse Practitioner

## 2019-09-09 DIAGNOSIS — Z23 Encounter for immunization: Secondary | ICD-10-CM | POA: Insufficient documentation

## 2019-09-09 DIAGNOSIS — U071 COVID-19: Secondary | ICD-10-CM | POA: Diagnosis not present

## 2019-09-09 MED ORDER — DIPHENHYDRAMINE HCL 50 MG/ML IJ SOLN: 50.0000 mg | Freq: Once | INTRAMUSCULAR | Status: DC | PRN

## 2019-09-09 MED ORDER — METHYLPREDNISOLONE SODIUM SUCC 125 MG IJ SOLR: 125.0000 mg | Freq: Once | INTRAMUSCULAR | Status: DC | PRN

## 2019-09-09 MED ORDER — EPINEPHRINE 0.3 MG/0.3ML IJ SOAJ: 0.3000 mg | Freq: Once | INTRAMUSCULAR | Status: DC | PRN

## 2019-09-09 MED ORDER — FAMOTIDINE IN NACL 20-0.9 MG/50ML-% IV SOLN: 20.0000 mg | Freq: Once | INTRAVENOUS | Status: DC | PRN

## 2019-09-09 MED ORDER — SODIUM CHLORIDE 0.9 % IV SOLN: INTRAVENOUS | Status: DC | PRN

## 2019-09-09 MED ORDER — ALBUTEROL SULFATE HFA 108 (90 BASE) MCG/ACT IN AERS: 2.0000 | INHALATION_SPRAY | Freq: Once | RESPIRATORY_TRACT | Status: DC | PRN

## 2019-09-09 MED ORDER — SODIUM CHLORIDE 0.9 % IV SOLN
700.0000 mg | Freq: Once | INTRAVENOUS | Status: AC
  Administered 2019-09-09: 700 mg via INTRAVENOUS
  Filled 2019-09-09: qty 20

## 2019-09-09 NOTE — Discharge Instructions (Signed)
What types of side effects do monoclonal antibody drugs cause?  Common side effects  In general, the more common side effects caused by monoclonal antibody drugs include: . Allergic reactions, such as hives or itching . Flu-like signs and symptoms, including chills, fatigue, fever, and muscle aches and pains . Nausea, vomiting . Diarrhea . Skin rashes . Low blood pressure   The CDC is recommending patients who receive monoclonal antibody treatments wait at least 90 days before being vaccinated.  Currently, there are no data on the safety and efficacy of mRNA COVID-19 vaccines in persons who received monoclonal antibodies or convalescent plasma as part of COVID-19 treatment. Based on the estimated half-life of such therapies as well as evidence suggesting that reinfection is uncommon in the 90 days after initial infection, vaccination should be deferred for at least 90 days, as a precautionary measure until additional information becomes available, to avoid interference of the antibody treatment with vaccine-induced immune responses.COVID-19: How to Protect Yourself and Others Know how it spreads  There is currently no vaccine to prevent coronavirus disease 2019 (COVID-19).  The best way to prevent illness is to avoid being exposed to this virus.  The virus is thought to spread mainly from person-to-person. ? Between people who are in close contact with one another (within about 6 feet). ? Through respiratory droplets produced when an infected person coughs, sneezes or talks. ? These droplets can land in the mouths or noses of people who are nearby or possibly be inhaled into the lungs. ? COVID-19 may be spread by people who are not showing symptoms. Everyone should Clean your hands often  Wash your hands often with soap and water for at least 20 seconds especially after you have been in a public place, or after blowing your nose, coughing, or sneezing.  If soap and water are not  readily available, use a hand sanitizer that contains at least 60% alcohol. Cover all surfaces of your hands and rub them together until they feel dry.  Avoid touching your eyes, nose, and mouth with unwashed hands. Avoid close contact  Limit contact with others as much as possible.  Avoid close contact with people who are sick.  Put distance between yourself and other people. ? Remember that some people without symptoms may be able to spread virus. ? This is especially important for people who are at higher risk of getting very [employer]cy Cover your mouth and nose with a mask when around others  You could spread COVID-19 to others even if you do not feel sick.  Everyone should wear a mask in public settings and when around people not living in their household, especially when social distancing is difficult to maintain. ? Masks should not be placed on young children under age 32, anyone who has trouble breathing, or is unconscious, incapacitated or otherwise unable to remove the mask without assistance.  The mask is meant to protect other people in case you are infected.  Do NOT use a facemask meant for a Dietitian.  Continue to keep about 6 feet between yourself and others. The mask is not a substitute for social distancing. Cover coughs and sneezes  Always cover your mouth and nose with a tissue when you cough or sneeze or use the inside of your elbow.  Throw used tissues in the trash.  Immediately wash your hands with soap and water for at least 20 seconds. If soap and water are not readily available, clean your hands with a hand  sanitizer that contains at least 60% alcohol. Clean and disinfect  Clean AND disinfect frequently touched surfaces daily. This includes tables, doorknobs, light switches, countertops, handles, desks, phones, keyboards, toilets, faucets, and sinks.  ktimeonline.com  If surfaces are dirty, clean them: Use detergent or soap and water prior to disinfection.  Then, use a household disinfectant. You can see a list of EPA-registered household disinfectants here. [employer] 04/09/2019 This information is not intended to replace advice given to you by your health care provider. Make sure you discuss any questions you have with your health care provider. Document Revised: 04/17/2019 Document Reviewed: 02/13/2019 Elsevier Patient Education  2020 [employer].

## 2019-09-09 NOTE — Progress Notes (Signed)
  Diagnosis: COVID-19  Physician: Dr. Delford Field  Procedure: [facility] Infusion Clinic Med: bamlanivimab infusion - Provided patient with bamlanimivab fact sheet for patients, parents and caregivers prior to infusion.  Complications: No immediate complications noted.  Discharge: Discharged home   Candie Mile 09/09/2019

## 2019-09-09 NOTE — Telephone Encounter (Signed)
Called to Discuss with patient about Covid symptoms and the use of bamlanivimab, a monoclonal antibody infusion for those with mild to moderate Covid symptoms and at a high risk of hospitalization.     Pt is qualified for this infusion at the [facility] due to co-morbid conditions and/or a member of an at-risk group.     Unable to reach pt  

## 2019-09-09 NOTE — Progress Notes (Signed)
  I connected by phone with Bonnie Gordon on 09/09/2019 at 9:21 AM to discuss the potential use of an new treatment for mild to moderate COVID-19 viral infection in non-hospitalized patients.  This patient is a 64 y.o. female that meets the FDA criteria for Emergency Use Authorization of bamlanivimab or casirivimab\imdevimab.  Has a (+) direct SARS-CoV-2 viral test result  Has mild or moderate COVID-19   Is ? 64 years of age and weighs ? 40 kg  Is NOT hospitalized due to COVID-19  Is NOT requiring oxygen therapy or requiring an increase in baseline oxygen flow rate due to COVID-19  Is within 10 days of symptom onset  Has at least one of the high risk factor(s) for progression to severe COVID-19 and/or hospitalization as defined in EUA.  Specific high risk criteria : Diabetes   I have spoken and communicated the following to the patient or parent/caregiver:  1. FDA has authorized the emergency use of bamlanivimab and casirivimab\imdevimab for the treatment of mild to moderate COVID-19 in adults and pediatric patients with positive results of direct SARS-CoV-2 viral testing who are 31 years of age and older weighing at least 40 kg, and who are at high risk for progressing to severe COVID-19 and/or hospitalization.  2. The significant known and potential risks and benefits of bamlanivimab and casirivimab\imdevimab, and the extent to which such potential risks and benefits are unknown.  3. Information on available alternative treatments and the risks and benefits of those alternatives, including clinical trials.  4. Patients treated with bamlanivimab and casirivimab\imdevimab should continue to self-isolate and use infection control measures (e.g., wear mask, isolate, social distance, avoid sharing personal items, clean and disinfect "high touch" surfaces, and frequent handwashing) according to CDC guidelines.   5. The patient or parent/caregiver has the option to accept or refuse bamlanivimab  or casirivimab\imdevimab .  After reviewing this information with the patient, The patient agreed to proceed with receiving the bamlanimivab infusion and will be provided a copy of the Fact sheet prior to receiving the infusion.Bonnie Gordon 09/09/2019 9:21 AM

## 2019-10-06 ENCOUNTER — Ambulatory Visit: Payer: Self-pay

## 2019-10-09 ENCOUNTER — Ambulatory Visit: Payer: Medicare HMO

## 2019-11-02 ENCOUNTER — Other Ambulatory Visit: Payer: Self-pay | Admitting: Internal Medicine

## 2019-11-21 ENCOUNTER — Other Ambulatory Visit: Payer: Self-pay

## 2019-11-25 ENCOUNTER — Other Ambulatory Visit: Payer: Self-pay

## 2019-11-25 ENCOUNTER — Encounter: Payer: Self-pay | Admitting: Internal Medicine

## 2019-11-25 ENCOUNTER — Ambulatory Visit (INDEPENDENT_AMBULATORY_CARE_PROVIDER_SITE_OTHER): Payer: Medicare HMO | Admitting: Internal Medicine

## 2019-11-25 VITALS — BP 122/80 | HR 83 | Ht 66.0 in | Wt 185.0 lb

## 2019-11-25 DIAGNOSIS — E669 Obesity, unspecified: Secondary | ICD-10-CM

## 2019-11-25 DIAGNOSIS — E1165 Type 2 diabetes mellitus with hyperglycemia: Secondary | ICD-10-CM | POA: Diagnosis not present

## 2019-11-25 DIAGNOSIS — E785 Hyperlipidemia, unspecified: Secondary | ICD-10-CM | POA: Diagnosis not present

## 2019-11-25 DIAGNOSIS — E1159 Type 2 diabetes mellitus with other circulatory complications: Secondary | ICD-10-CM

## 2019-11-25 LAB — POCT GLYCOSYLATED HEMOGLOBIN (HGB A1C): Hemoglobin A1C: 8.3 % — AB (ref 4.0–5.6)

## 2019-11-25 MED ORDER — OZEMPIC (0.25 OR 0.5 MG/DOSE) 2 MG/1.5ML ~~LOC~~ SOPN: 0.5000 mg | PEN_INJECTOR | SUBCUTANEOUS | 5 refills | Status: DC

## 2019-11-25 NOTE — Addendum Note (Signed)
Addended by: Darliss Ridgel I on: 11/25/2019 09:33 AM   Modules accepted: Orders

## 2019-11-25 NOTE — Patient Instructions (Signed)
Please  continue: - Glipizide ER 5 mg before breakfast and dinner - Victoza 1.8 mg in a.m.  Try to switch from Victoza to Ozempic 0.5 mg weekly.  If you cannot start Ozempic, increase Glipizide ER to 10 mg in am.  STOP REGULAR SODAS and GUMMIES.  Please return in 3-4 months with your sugar log.

## 2019-11-25 NOTE — Progress Notes (Signed)
Patient ID: Bonnie Gordon, female   DOB: 17-Apr-1956, 64 y.o.   MRN: [id number]  This visit occurred during the SARS-CoV-2 public health emergency.  Safety protocols were in place, including screening questions prior to the visit, additional usage of staff PPE, and extensive cleaning of exam room while observing appropriate contact time as indicated for disinfecting solutions.   HPI: Bonnie Gordon is a 64 y.o.-year-old female, presenting for follow-up for DM2, dx in 2012, insulin-independent, uncontrolled, with complications (CAD, PN). Last visit 4 months ago.  She had COVID-19 in 01/20.  She had a mild form recovered completely.  At this visit, she tells me that she restarted drinking regular sodas, although not every day.  She also has 3 gummy vitamins that she takes every day.  Reviewed HbA1c: Lab Results  Component Value Date   HGBA1C 7.1 (A) 07/29/2019   HGBA1C 7.7 (A) 04/01/2019   HGBA1C 7.5 (A) 07/15/2018   HGBA1C 8.1 08/24/2017   HGBA1C 6.8 04/25/2017   HGBA1C 6.5 01/18/2017   HGBA1C 6.2 10/18/2016   HGBA1C 7.5 07/20/2016   HGBA1C 9.5 04/20/2016   HGBA1C 8.1 (H) 08/30/2015   HGBA1C 7.1 (H) 02/25/2015   HGBA1C 7.0 (H) 08/26/2014   HGBA1C (H) 09/16/2009    6.5 (NOTE) The ADA recommends the following therapeutic goal for glycemic control related to Hgb A1c measurement: Goal of therapy: <6.5 Hgb A1c  Reference: American Diabetes Association: Clinical Practice Recommendations 2010, Diabetes Care, 2010, 33: (Suppl  1).  11/2018: HbA1c 7.2% 01/14/2016: HbA1c 11.3%  Patient is on: - Glipizide ER 5 mg before breakfast and dinner - Victoza 1.8 mg at night >> moved to a.m. She was also on: - Levemir up to 60 units at bedtime-stopped 11/2018 - Glipizide 5 mg before dinner >> stopped  - Glimepiride 4 mg before b'fast/ Glipizide XL 10 mg in am  - Tradjenta 5 mg in am  These were stopped when she started insulin. She was on Metformin >> diarrhea.  Pt checks her sugars twice a day: -  am: 96, 118-132, 184 >> 94, 100-120, 137 >> 94, 100, 124-134, 179 - 2h after b'fast: 127 >> n/c >> 126 >> n/c - before lunch: 100, 120, 153 >> n/c >> 64 >> n/c - 2h after lunch: n/c >> 167 (cake) >> n/c - before dinner: 68, 113 >> 80-120 >> n/c >> 130 >> n/c - 2h after dinner: n/c >> 196 x1 >> n/c - bedtime: 120 >> 120s >> 120 >> 115-120, 160 >> 118-134 - nighttime: n/c Lowest sugar was 64 >> ... 94 >> 92; it is unclear at which level she has hypoglycemia awareness. Highest sugar was 300 x1 .Marland KitchenMarland Kitchen184 (lemonade) >> 160 >> 409 (?).  Glucometer: AccuChek Aviva  Meals: fish  - fried - once a month when she eats out; no red meat.  Salads with ranch dressing.  -No CKD, last BUN/creatinine: Lab Results  Component Value Date   BUN 6 (L) 07/29/2019   BUN 6 (L) 03/14/2019   CREATININE 0.72 (L) 07/29/2019   CREATININE 0.79 03/14/2019  01/13/2016: 12/0.83, glucose 472  -+ HL; last set of lipids: Lab Results  Component Value Date   CHOL 148 07/29/2019   HDL 56 07/29/2019   LDLCALC 65 07/29/2019   TRIG 160 (H) 07/29/2019   CHOLHDL 2.6 07/29/2019  01/13/2016: 147/218/73/69 On atorvastatin.  Previously on Crestor. - last eye exam was in 06/2019: Reportedly no DR, + incipient cataract. Started to see [facility]. -+ Numbness and tingling in her feet.  She sees podiatry.  Previously on Neurontin. I suggested alpha-lipoic acid and B complex in the past.  TSH levels were normal 01/13/2016: TSH 1.39 Lab Results  Component Value Date   TSH 0.835 08/30/2015   She was admitted with CP 05/2016 (found to have hypokalemia, mm cramp).  She has mild nonobstructive CAD.  She will be a trial for study for nausea in pts with DM.  ROS: Constitutional: no weight gain/no weight loss, no fatigue, no subjective hyperthermia, no subjective hypothermia Eyes: no blurry vision, no xerophthalmia ENT: no sore throat, no nodules palpated in neck, no dysphagia, no odynophagia, no hoarseness Cardiovascular: no  CP/no SOB/no palpitations/no leg swelling Respiratory: no cough/no SOB/no wheezing Gastrointestinal: no N/no V/no D/no C/no acid reflux Musculoskeletal: no muscle aches/no joint aches Skin: no rashes, no hair loss Neurological: no tremors/+ numbness/+ tingling/no dizziness  I reviewed pt's medications, allergies, PMH, social hx, family hx, and changes were documented in the history of present illness. Otherwise, unchanged from my initial visit note.  Past Medical History:  Diagnosis Date  . Anxiety   . Bipolar disorder ([place])   . Chest pain    a. 01/2003 MV w/ ? ant-inflat ischemia-->Cath: LAD 20p, otw nonobs dzs-->Med Rx; b. 2009 Cath: LAD 30, LCX nl/nondominant, RCA 30, nl EF; c. 08/2014 MV: no ischemia/infarct.  . Complication of anesthesia    pt. states she is difficult to wake up  . Depression   . Diabetes mellitus    Type II  . Diabetic neuropathy ([place])   . Diverticulitis   . GERD (gastroesophageal reflux disease)   . Headache   . History of bronchitis   . Hypercholesteremia   . Hypertension   . Hypokalemia   . PONV (postoperative nausea and vomiting)   . Shortness of breath dyspnea   . Sleep apnea   . Vertigo    patient reported   Past Surgical History:  Procedure Laterality Date  . ABDOMINAL HYSTERECTOMY    . APPENDECTOMY    . CARDIAC CATHETERIZATION  2009   normal cath  . CARDIAC CATHETERIZATION  2004   normal cath  . CHOLECYSTECTOMY    . TONSILLECTOMY    . TONSILLECTOMY/ADENOIDECTOMY/TURBINATE REDUCTION Bilateral 03/01/2015   Procedure: TONSILLECTOMY/TURBINATE REDUCTION;  Surgeon: Rozetta Nunnery, MD;  Location: [facility];  Service: ENT;  Laterality: Bilateral;  . UVULOPALATOPHARYNGOPLASTY Bilateral 03/01/2015   Procedure: UVULOPALATOPHARYNGOPLASTY (UPPP);  Surgeon: Rozetta Nunnery, MD;  Location: [facility];  Service: ENT;  Laterality: Bilateral;  . WISDOM TOOTH EXTRACTION     Social History   Social History  . Marital status: Divorced    Spouse name: N/A   . Number of children: 2   Occupational History  . Disability    Social History Main Topics  . Smoking status: Former Smoker    Quit date: 08/07/1982  . Smokeless tobacco: Never Used  . Alcohol use Yes     Comment: Drinks during the weekends 1 beer or 1 drink of liquor per day   . Drug use: No  . Sexual activity: Yes    Partners: Male    Birth control/ protection: Surgical   Current Outpatient Medications on File Prior to Visit  Medication Sig Dispense Refill  . ACCU-CHEK SMARTVIEW test strip USE AS INSTRUCTED TO CHECK SUGAR 2 TIMES DAILY 200 strip 5  . amLODipine (NORVASC) 5 MG tablet TAKE 1 TABLET (5 MG TOTAL) BY MOUTH DAILY. 90 tablet 3  . aspirin EC 81 MG tablet Take 81 mg by  mouth daily.    Marland Kitchen atorvastatin (LIPITOR) 20 MG tablet Take 20 mg by mouth daily.    Marland Kitchen b complex vitamins capsule Take 1 capsule by mouth daily.    . benzonatate (TESSALON) 100 MG capsule Take 1-2 capsules (100-200 mg total) by mouth 3 (three) times daily as needed. 60 capsule 0  . Blood Glucose Monitoring Suppl (ACCU-CHEK NANO SMARTVIEW) w/Device KIT USE TO CHECK SUGAR DAILY. 1 kit 0  . celecoxib (CELEBREX) 200 MG capsule Take 200 mg by mouth daily as needed.     . diclofenac sodium (VOLTAREN) 1 % GEL Apply 2 g topically 4 (four) times daily as needed for pain.    Marland Kitchen glipiZIDE (GLUCOTROL XL) 5 MG 24 hr tablet Take 1 tablet (5 mg total) by mouth 2 (two) times daily. 180 tablet 3  . Insulin Pen Needle 32G X 4 MM MISC USE EVERY DAY WITH DIABETIC INJECTABLES 100 each 2  . liraglutide (VICTOZA) 18 MG/3ML SOPN INJECT 0.3 MLS INTO THE SKIN DAILY. NEED APPOINTMENT 9 pen 2  . metoprolol succinate (TOPROL XL) 25 MG 24 hr tablet Take 1 tablet (25 mg total) by mouth daily. 90 tablet 3  . Multiple Vitamin (MULTIVITAMIN) capsule Take 1 capsule by mouth daily.    . pregabalin (LYRICA) 50 MG capsule Take 50 mg by mouth 3 (three) times daily as needed (nerve pain).     . promethazine-dextromethorphan (PROMETHAZINE-DM) 6.25-15  MG/5ML syrup Take 5 mLs by mouth at bedtime as needed for cough. 100 mL 0  . Pseudoephedrine-APAP-DM (DAYQUIL PO) Take by mouth.    . spironolactone (ALDACTONE) 25 MG tablet Take 0.5 tablets (12.5 mg total) by mouth as needed (FOR SWELLING). 90 tablet 1  . valACYclovir (VALTREX) 500 MG tablet Take 500 mg by mouth daily as needed (flare up).      No current facility-administered medications on file prior to visit.   Allergies  Allergen Reactions  . Other Other (See Comments)    No Blood Products, personal preference  . Latex Itching and Rash   Family History  Problem Relation Age of Onset  . Diabetes Mother   . Gout Mother   . Hypertension Mother   . Arthritis Mother   . Heart disease Father   . Heart failure Sister   . Arthritis Sister   . Heart disease Maternal Grandmother   . Diabetes Maternal Grandmother   . Heart disease Paternal Grandmother   . Diabetes Paternal Grandmother   . Diabetes Sister   . Bursitis Sister   . Crohn's disease Sister    PE: BP 122/80   Pulse 83   Ht 5' 6"  (1.676 m)   Wt 185 lb (83.9 kg)   SpO2 98%   BMI 29.86 kg/m  Wt Readings from Last 3 Encounters:  11/25/19 185 lb (83.9 kg)  07/29/19 187 lb (84.8 kg)  07/02/19 187 lb 12.8 oz (85.2 kg)   Constitutional: overweight, in NAD Eyes: PERRLA, EOMI, no exophthalmos ENT: moist mucous membranes, no thyromegaly, no cervical lymphadenopathy Cardiovascular: RRR, No MRG Respiratory: CTA B Gastrointestinal: abdomen soft, NT, ND, BS+ Musculoskeletal: no deformities, strength intact in all 4 Skin: moist, warm, no rashes Neurological: no tremor with outstretched hands, DTR normal in all 4  ASSESSMENT: 1. DM2, insulin-independent now, uncontrolled, with complications - CAD - PN  2. HL  3.  Obesity class I  PLAN:  1. Patient with longstanding, uncontrolled, type 2 diabetes, with improved control more recently, so she could come off.  At  last visit, sugars are at goal, with only few mildly  hyperglycemic exceptions.  In the past she was taking Victoza at bedtime so we moved this to the morning and she felt that this helped.  We did not change her regimen at last visit.  HbA1c at that time was 7.1%, improved. -At this visit, sugars are higher in the morning, but still at or close to goal.  Later in the day, at bedtime, they are still at goal.  However, we checked her HbA1c: 8.3% (higher), therefore, we are missing some of her higher blood sugars.  We discussed that she cannot drink any regular sodas and she had to stop these completely.  Also, I advised her to stop the guarding vitamins.  Also, we will try to switch from Victoza to [place] but if this is not covered by her insurance, we discussed about doubling up on the glipizide in the morning.  She is also planning to start exercise.  I advised her to let me know if she has any lows. - I suggested to:  Patient Instructions  Please  continue: - Glipizide ER 5 mg before breakfast and dinner - Victoza 1.8 mg in a.m.  Try to switch from Victoza to Ozempic 0.5 mg weekly.  If you cannot start Ozempic, increase Glipizide ER to 10 mg in am.  STOP REGULAR SODAS and GUMMIES.  Please return in 3-4 months with your sugar log.   - advised to check sugars at different times of the day - 2x a day, rotating check times - advised for yearly eye exams >> she is UTD - return to clinic in 4 months  2. HL -Reviewed latest lipid panel from 07/2019: LDL at goal, lower than 70, triglycerides slightly Lab Results  Component Value Date   CHOL 148 07/29/2019   HDL 56 07/29/2019   LDLCALC 65 07/29/2019   TRIG 160 (H) 07/29/2019   CHOLHDL 2.6 07/29/2019  -Continues Lipitor without side records  3.  Obesity class I -Continue GLP-1 receptor agonist which should also help with weight loss.  We will switch from Victoza to Ozempic, hopefully, and this should also help with weight loss -Weight stable since last visit  Philemon Kingdom, MD  PhD [facility] Endocrinology

## 2019-11-28 ENCOUNTER — Telehealth: Payer: Self-pay | Admitting: Cardiovascular Disease

## 2019-11-28 NOTE — Telephone Encounter (Signed)
     I went in pt's chart to see who had called her today.(11-28-19)

## 2019-12-01 ENCOUNTER — Other Ambulatory Visit (HOSPITAL_BASED_OUTPATIENT_CLINIC_OR_DEPARTMENT_OTHER): Payer: Self-pay | Admitting: Family Medicine

## 2019-12-01 DIAGNOSIS — Z1231 Encounter for screening mammogram for malignant neoplasm of breast: Secondary | ICD-10-CM

## 2019-12-01 NOTE — Progress Notes (Signed)
Cardiology Office Note:    Date:  12/02/2019   ID:  Bonnie Gordon, DOB Oct 10, 1955, MRN [id number]  PCP:  Lin Landsman, MD  Cardiologist:  Shelva Majestic, MD   Referring MD: Lin Landsman, MD   Chief Complaint  Patient presents with  . Follow-up    chest pain    History of Present Illness:    Bonnie Gordon is a 64 y.o. female with a hx of nonobstructive CAD, HTN, OSA on CPAP, RBBB, obesity, DM, and GERD. Heart cath in 2009 showed nonosbtructive CAD with 30% LAD and RCA stenosis, normal LVEF. Complaints of chest discomfort in 2016 led to an echo, which showed normal EF and no WMA or valvular disease. Nuclear stress test was normal.  She was seen in the ER 03/2019 for chest pain and dyspnea and ruled out with negative CE. Follow up 03/26/19 with Coletta Memos NP with worsening DOE that improved with her inhaler. CT coronary with mild coronary calcium at 58 which was 58th percentile for age and sex matched controls. Proximal LAD with 25-69% stenosis.   She presents today for routine follow up. She had COVID-26 Sep 2019. She presented to UC for chest pain and she requested COVID-19 testing. Her boyfriend and her employer (17 yo woman she helps). She did have some shortness of breath with diaphoresis 1 month after her COVID infection. PCP started prednisone and this significantly improved her SOB. She has no complaints today. She is maintained on BB and norvasc. Dr. Claiborne Billings previously stopped her 12.5 mg spironolactone.    Past Medical History:  Diagnosis Date  . Anxiety   . Bipolar disorder ([place])   . Chest pain    a. 01/2003 MV w/ ? ant-inflat ischemia-->Cath: LAD 20p, otw nonobs dzs-->Med Rx; b. 2009 Cath: LAD 30, LCX nl/nondominant, RCA 30, nl EF; c. 08/2014 MV: no ischemia/infarct.  . Complication of anesthesia    pt. states she is difficult to wake up  . Depression   . Diabetes mellitus    Type II  . Diabetic neuropathy ([place])   . Diverticulitis   . GERD (gastroesophageal reflux disease)   .  Headache   . History of bronchitis   . Hypercholesteremia   . Hypertension   . Hypokalemia   . PONV (postoperative nausea and vomiting)   . Shortness of breath dyspnea   . Sleep apnea   . Vertigo    patient reported    Past Surgical History:  Procedure Laterality Date  . ABDOMINAL HYSTERECTOMY    . APPENDECTOMY    . CARDIAC CATHETERIZATION  2009   normal cath  . CARDIAC CATHETERIZATION  2004   normal cath  . CHOLECYSTECTOMY    . TONSILLECTOMY    . TONSILLECTOMY/ADENOIDECTOMY/TURBINATE REDUCTION Bilateral 03/01/2015   Procedure: TONSILLECTOMY/TURBINATE REDUCTION;  Surgeon: Rozetta Nunnery, MD;  Location: [facility];  Service: ENT;  Laterality: Bilateral;  . UVULOPALATOPHARYNGOPLASTY Bilateral 03/01/2015   Procedure: UVULOPALATOPHARYNGOPLASTY (UPPP);  Surgeon: Rozetta Nunnery, MD;  Location: [facility];  Service: ENT;  Laterality: Bilateral;  . WISDOM TOOTH EXTRACTION      Current Medications: Current Meds  Medication Sig  . ACCU-CHEK SMARTVIEW test strip USE AS INSTRUCTED TO CHECK SUGAR 2 TIMES DAILY  . amLODipine (NORVASC) 5 MG tablet TAKE 1 TABLET (5 MG TOTAL) BY MOUTH DAILY.  Marland Kitchen aspirin EC 81 MG tablet Take 81 mg by mouth daily.  Marland Kitchen atorvastatin (LIPITOR) 20 MG tablet Take 20 mg by mouth daily.  Marland Kitchen b complex vitamins capsule Take  1 capsule by mouth daily.  . benzonatate (TESSALON) 100 MG capsule Take 1-2 capsules (100-200 mg total) by mouth 3 (three) times daily as needed.  . Blood Glucose Monitoring Suppl (ACCU-CHEK NANO SMARTVIEW) w/Device KIT USE TO CHECK SUGAR DAILY.  . celecoxib (CELEBREX) 200 MG capsule Take 200 mg by mouth daily as needed.   . diclofenac sodium (VOLTAREN) 1 % GEL Apply 2 g topically 4 (four) times daily as needed for pain.  Marland Kitchen glipiZIDE (GLUCOTROL XL) 5 MG 24 hr tablet Take 1 tablet (5 mg total) by mouth 2 (two) times daily.  . Insulin Pen Needle 32G X 4 MM MISC USE EVERY DAY WITH DIABETIC INJECTABLES  . metoprolol succinate (TOPROL XL) 25 MG 24 hr  tablet Take 1 tablet (25 mg total) by mouth daily.  . Multiple Vitamin (MULTIVITAMIN) capsule Take 1 capsule by mouth daily.  . pregabalin (LYRICA) 50 MG capsule Take 50 mg by mouth 3 (three) times daily as needed (nerve pain).   . promethazine-dextromethorphan (PROMETHAZINE-DM) 6.25-15 MG/5ML syrup Take 5 mLs by mouth at bedtime as needed for cough.  . Pseudoephedrine-APAP-DM (DAYQUIL PO) Take by mouth.  . Semaglutide,0.25 or 0.5MG/DOS, (OZEMPIC, 0.25 OR 0.5 MG/DOSE,) 2 MG/1.5ML SOPN Inject 0.5 mg into the skin once a week.  . spironolactone (ALDACTONE) 25 MG tablet Take 0.5 tablets (12.5 mg total) by mouth as needed (FOR SWELLING).  . valACYclovir (VALTREX) 500 MG tablet Take 500 mg by mouth daily as needed (flare up).      Allergies:   Other and Latex   Social History   Socioeconomic History  . Marital status: Divorced    Spouse name: Not on file  . Number of children: Not on file  . Years of education: some college  . Highest education level: Not on file  Occupational History  . Occupation: Retired Quarry manager  Tobacco Use  . Smoking status: Former Smoker    Quit date: 08/07/1982    Years since quitting: 37.3  . Smokeless tobacco: Never Used  Substance and Sexual Activity  . Alcohol use: Yes    Comment: occasional / social  . Drug use: No  . Sexual activity: Yes    Partners: Male    Birth control/protection: Surgical  Other Topics Concern  . Not on file  Social History Narrative   Lives at home alone.   Right-handed.    1 cup caffeine per day.   Social Determinants of Health   Financial Resource Strain:   . Difficulty of Paying Living Expenses:   Food Insecurity:   . Worried About Charity fundraiser in the Last Year:   . Arboriculturist in the Last Year:   Transportation Needs:   . Film/video editor (Medical):   Marland Kitchen Lack of Transportation (Non-Medical):   Physical Activity:   . Days of Exercise per Week:   . Minutes of Exercise per Session:   Stress:   . Feeling  of Stress :   Social Connections:   . Frequency of Communication with Friends and Family:   . Frequency of Social Gatherings with Friends and Family:   . Attends Religious Services:   . Active Member of Clubs or Organizations:   . Attends Archivist Meetings:   Marland Kitchen Marital Status:      Family History: The patient's family history includes Arthritis in her mother and sister; Bursitis in her sister; Crohn's disease in her sister; Diabetes in her maternal grandmother, mother, paternal grandmother, and sister; Gout  in her mother; Heart disease in her father, maternal grandmother, and paternal grandmother; Heart failure in her sister; Hypertension in her mother.  ROS:   Please see the history of present illness.     All other systems reviewed and are negative.  EKGs/Labs/Other Studies Reviewed:    The following studies were reviewed today:  CT coronary 03/2019: IMPRESSION: 1. Coronary calcium score of 92. This was 44 percentile for age and sex matched control.  2. Normal coronary origin with right dominance.  3. CAD-RADS 2. Mild non-obstructive CAD (25-49%) in the proximal portions of LAD, ramus intermedius and LCX arteries. Consider non-atherosclerotic causes of chest pain. Consider preventive therapy and risk factor modification.  EKG:  EKG is not ordered today.    Recent Labs: 03/14/2019: Hemoglobin 14.9; Platelets 213 07/29/2019: BUN 6; Creatinine, Ser 0.72; Potassium 4.4; Sodium 144  Recent Lipid Panel    Component Value Date/Time   CHOL 148 07/29/2019 1047   TRIG 160 (H) 07/29/2019 1047   HDL 56 07/29/2019 1047   CHOLHDL 2.6 07/29/2019 1047   CHOLHDL 2.1 02/29/2016 0856   VLDL 19 02/29/2016 0856   LDLCALC 65 07/29/2019 1047    Physical Exam:    VS:  BP 118/70   Pulse 93   Ht _0  (1.676 m)   Wt 186 lb 9.6 oz (84.6 kg)   BMI 30.12 kg/m     Wt Readings from Last 3 Encounters:  12/02/19 186 lb 9.6 oz (84.6 kg)  11/25/19 185 lb (83.9 kg)  07/29/19  187 lb (84.8 kg)     GEN:  Well nourished, well developed in no acute distress HEENT: Normal NECK: No JVD; No carotid bruits LYMPHATICS: No lymphadenopathy CARDIAC: RRR, no murmurs, rubs, gallops RESPIRATORY:  Clear to auscultation without rales, wheezing or rhonchi  ABDOMEN: Soft, non-tender, non-distended MUSCULOSKELETAL:  No edema; No deformity  SKIN: Warm and dry NEUROLOGIC:  Alert and oriented x 3 PSYCHIATRIC:  Normal affect   ASSESSMENT:    1. CAD in native artery   2. Essential hypertension   3. Hyperlipidemia LDL goal <70   4. Mild obstructive sleep apnea    PLAN:    In order of problems listed above:  Nonobstructive CAD - continue ASA, statin, BB - no further chest pain or dyspnea since her COVID infection and prednisone taper - CT coronary reassuring   Hypertension - maintained on BB and norvasc - Dr. Claiborne Billings stopped her 12.41m spironolactone - no swelling   Hyperlipidemia - 07/29/2019: Cholesterol, Total 148; HDL 56; LDL Chol Calc (NIH) 65; Triglycerides 160 - continue statin   OSA - not on CPAP - at last evaluation, OSA was deemed mild   Follow up in 1 year.   Medication Adjustments/Labs and Tests Ordered: Current medicines are reviewed at length with the patient today.  Concerns regarding medicines are outlined above.  No orders of the defined types were placed in this encounter.  No orders of the defined types were placed in this encounter.   Signed, ALedora Bottcher PUtah 12/02/2019 9:21 AM    [place]

## 2019-12-02 ENCOUNTER — Encounter: Payer: Self-pay | Admitting: Physician Assistant

## 2019-12-02 ENCOUNTER — Other Ambulatory Visit: Payer: Self-pay

## 2019-12-02 ENCOUNTER — Ambulatory Visit (INDEPENDENT_AMBULATORY_CARE_PROVIDER_SITE_OTHER): Payer: Medicare HMO | Admitting: Physician Assistant

## 2019-12-02 VITALS — BP 118/70 | HR 93 | Ht 66.0 in | Wt 186.6 lb

## 2019-12-02 DIAGNOSIS — E785 Hyperlipidemia, unspecified: Secondary | ICD-10-CM | POA: Diagnosis not present

## 2019-12-02 DIAGNOSIS — I2510 Atherosclerotic heart disease of native coronary artery without angina pectoris: Secondary | ICD-10-CM | POA: Diagnosis not present

## 2019-12-02 DIAGNOSIS — G4733 Obstructive sleep apnea (adult) (pediatric): Secondary | ICD-10-CM | POA: Diagnosis not present

## 2019-12-02 DIAGNOSIS — I10 Essential (primary) hypertension: Secondary | ICD-10-CM | POA: Diagnosis not present

## 2019-12-02 NOTE — Patient Instructions (Signed)
Medication Instructions:  Your physician recommends that you continue on your current medications as directed. Please refer to the Current Medication list given to you today.  *If you need a refill on your cardiac medications before your next appointment, please call your pharmacy*   Follow-Up: At [facility], you and your health needs are our priority.  As part of our continuing mission to provide you with exceptional heart care, we have created designated Provider Care Teams.  These Care Teams include your primary Cardiologist (physician) and Advanced Practice Providers (APPs -  Physician Assistants and Nurse Practitioners) who all work together to provide you with the care you need, when you need it.  We recommend signing up for the patient portal called "MyChart".  Sign up information is provided on this After Visit Summary.  MyChart is used to connect with patients for Virtual Visits (Telemedicine).  Patients are able to view lab/test results, encounter notes, upcoming appointments, etc.  Non-urgent messages can be sent to your provider as well.   To learn more about what you can do with MyChart, go to [email or web address].    Your next appointment:   12 month(s)  The format for your next appointment:   In Person  Provider:   You may see Nicki Guadalajara, MD or one of the following Advanced Practice Providers on your designated Care Team:    Azalee Course, PA-C  Micah Flesher, [place] or   Judy Pimple, [place]    Other Instructions Please call our office 2 months in advance to schedule your annual follow-up with Dr. Tresa Endo.

## 2019-12-04 ENCOUNTER — Ambulatory Visit: Payer: Medicare HMO | Admitting: Physician Assistant

## 2019-12-06 ENCOUNTER — Ambulatory Visit: Payer: Medicare HMO | Attending: Internal Medicine

## 2019-12-06 DIAGNOSIS — Z23 Encounter for immunization: Secondary | ICD-10-CM

## 2019-12-06 NOTE — Progress Notes (Signed)
   Covid-19 Vaccination Clinic  Name:  Bonnie Gordon    MRN: [id number] DOB: 1956/02/29  12/06/2019  [name] was observed post Covid-19 immunization for 15 minutes without incident. She was provided with Vaccine Information Sheet and instruction to access the V-Safe system.   [name] was instructed to call 911 with any severe reactions post vaccine: Marland Kitchen Difficulty breathing  . Swelling of face and throat  . A fast heartbeat  . A bad rash all over body  . Dizziness and weakness   Immunizations Administered    Name Date Dose VIS Date Route   Pfizer COVID-19 Vaccine 12/06/2019 11:40 AM 0.3 mL 10/01/2018 Intramuscular   Manufacturer: [employer], [employer]   Lot: [id number]   NDC: [address]-2

## 2019-12-29 ENCOUNTER — Ambulatory Visit: Payer: Medicare HMO | Attending: Internal Medicine

## 2019-12-29 DIAGNOSIS — Z23 Encounter for immunization: Secondary | ICD-10-CM

## 2019-12-29 NOTE — Progress Notes (Signed)
   Covid-19 Vaccination Clinic  Name:  Bonnie Gordon    MRN: [id number] DOB: 11/16/1955  12/29/2019  [name] was observed post Covid-19 immunization for 15 minutes without incident. She was provided with Vaccine Information Sheet and instruction to access the V-Safe system.   [name] was instructed to call 911 with any severe reactions post vaccine: Marland Kitchen Difficulty breathing  . Swelling of face and throat  . A fast heartbeat  . A bad rash all over body  . Dizziness and weakness   Immunizations Administered    Name Date Dose VIS Date Route   Pfizer COVID-19 Vaccine 12/29/2019  8:51 AM 0.3 mL 10/01/2018 Intramuscular   Manufacturer: [employer], [employer]   Lot: [id number]   NDC: [address]-1

## 2020-01-05 ENCOUNTER — Encounter (HOSPITAL_COMMUNITY): Payer: Self-pay | Admitting: Emergency Medicine

## 2020-01-05 ENCOUNTER — Emergency Department (HOSPITAL_COMMUNITY): Payer: Medicare HMO

## 2020-01-05 ENCOUNTER — Other Ambulatory Visit: Payer: Self-pay

## 2020-01-05 ENCOUNTER — Emergency Department (HOSPITAL_COMMUNITY)
Admission: EM | Admit: 2020-01-05 | Discharge: 2020-01-05 | Disposition: A | Discharge status: Home / Self Care | Payer: Medicare HMO | Attending: Emergency Medicine | Admitting: Emergency Medicine

## 2020-01-05 DIAGNOSIS — R519 Headache, unspecified: Secondary | ICD-10-CM | POA: Diagnosis present

## 2020-01-05 DIAGNOSIS — I10 Essential (primary) hypertension: Secondary | ICD-10-CM | POA: Diagnosis not present

## 2020-01-05 DIAGNOSIS — Z87891 Personal history of nicotine dependence: Secondary | ICD-10-CM | POA: Insufficient documentation

## 2020-01-05 DIAGNOSIS — E119 Type 2 diabetes mellitus without complications: Secondary | ICD-10-CM | POA: Insufficient documentation

## 2020-01-05 DIAGNOSIS — Z7984 Long term (current) use of oral hypoglycemic drugs: Secondary | ICD-10-CM | POA: Insufficient documentation

## 2020-01-05 DIAGNOSIS — G44229 Chronic tension-type headache, not intractable: Secondary | ICD-10-CM | POA: Diagnosis not present

## 2020-01-05 DIAGNOSIS — Z79899 Other long term (current) drug therapy: Secondary | ICD-10-CM | POA: Diagnosis not present

## 2020-01-05 LAB — CBC
HCT: 44.9 % (ref 36.0–46.0)
Hemoglobin: 14.4 g/dL (ref 12.0–15.0)
MCH: 28.3 pg (ref 26.0–34.0)
MCHC: 32.1 g/dL (ref 30.0–36.0)
MCV: 88.2 fL (ref 80.0–100.0)
Platelets: 178 10*3/uL (ref 150–400)
RBC: 5.09 MIL/uL (ref 3.87–5.11)
RDW: 13.4 % (ref 11.5–15.5)
WBC: 4.9 10*3/uL (ref 4.0–10.5)
nRBC: 0 % (ref 0.0–0.2)

## 2020-01-05 LAB — COMPREHENSIVE METABOLIC PANEL
ALT: 23 U/L (ref 0–44)
AST: 17 U/L (ref 15–41)
Albumin: 4.2 g/dL (ref 3.5–5.0)
Alkaline Phosphatase: 143 U/L — ABNORMAL HIGH (ref 38–126)
Anion gap: 8 (ref 5–15)
BUN: 11 mg/dL (ref 8–23)
CO2: 28 mmol/L (ref 22–32)
Calcium: 9.4 mg/dL (ref 8.9–10.3)
Chloride: 106 mmol/L (ref 98–111)
Creatinine, Ser: 0.67 mg/dL (ref 0.44–1.00)
GFR calc Af Amer: >60 mL/min (ref >60)
GFR calc non Af Amer: >60 mL/min (ref >60)
Glucose, Bld: 161 mg/dL — ABNORMAL HIGH (ref 70–99)
Potassium: 4 mmol/L (ref 3.5–5.1)
Sodium: 142 mmol/L (ref 135–145)
Total Bilirubin: 0.4 mg/dL (ref 0.3–1.2)
Total Protein: 7.9 g/dL (ref 6.5–8.1)

## 2020-01-05 MED ORDER — KETOROLAC TROMETHAMINE 30 MG/ML IJ SOLN: 30.0000 mg | Freq: Once | INTRAMUSCULAR | Status: DC

## 2020-01-05 MED ORDER — KETOROLAC TROMETHAMINE 30 MG/ML IJ SOLN
30.0000 mg | Freq: Once | INTRAMUSCULAR | Status: AC
  Administered 2020-01-05: 30 mg via INTRAMUSCULAR
  Filled 2020-01-05: qty 1

## 2020-01-05 MED ORDER — METOCLOPRAMIDE HCL 5 MG/ML IJ SOLN
10.0000 mg | Freq: Once | INTRAMUSCULAR | Status: AC
  Administered 2020-01-05: 10 mg via INTRAMUSCULAR
  Filled 2020-01-05: qty 2

## 2020-01-05 MED ORDER — DIPHENHYDRAMINE HCL 50 MG/ML IJ SOLN
25.0000 mg | Freq: Once | INTRAMUSCULAR | Status: AC
  Administered 2020-01-05: 25 mg via INTRAMUSCULAR
  Filled 2020-01-05: qty 1

## 2020-01-05 NOTE — ED Notes (Signed)
Discharge paperwork reviewed with pt.  Pt with no questions at this time, ambulatory at time of discharge.  

## 2020-01-05 NOTE — ED Provider Notes (Addendum)
[facility] DEPT Provider Note   CSN: [id number] Arrival date & time: 01/05/20  5498     History Chief Complaint  Patient presents with  . Headache    Bonnie Gordon is a 64 y.o. female with pertinent past medical history of anxiety, bipolar disorder, depression, diabetes, migraines, OSA on CPAP, right bundle branch block, obesity that presents the emergency department today for headache.  Patient states that this is a new headache for her, started 3 months ago after she had Covid.  States that the headache has been constant since then, denies any changes in the morning or at night.  States that her headache is a10/10, but not worse than her migraines that she has had in the past.  No sudden onset of headache.  No history of cancer or HIV.  Headache is different than her usual type of headache.  No fever, chills, weight changes, vision changes, weakness, syncope, dizziness, worse with exertion, trismus, headache that worsens with chewing, altered mental status, trauma to the head.  Denies any nausea, vomiting, photophobia.  Denies any chest pain or shortness of breath that is new, patient states that she has had shortness of breath for many years, however is unchanged. patient states that she has been walking normally.  Denies any URI-like symptoms.  States that she is never had a headache like this before. No neck or back pain.  Has tried ibuprofen daily and at night for the past 3 months without any relief.  States that she does not want take ibuprofen daily anymore.   HPI     Past Medical History:  Diagnosis Date  . Anxiety   . Bipolar disorder ([place])   . Chest pain    a. 01/2003 MV w/ ? ant-inflat ischemia-->Cath: LAD 20p, otw nonobs dzs-->Med Rx; b. 2009 Cath: LAD 30, LCX nl/nondominant, RCA 30, nl EF; c. 08/2014 MV: no ischemia/infarct.  . Complication of anesthesia    pt. states she is difficult to wake up  . Depression   . Diabetes mellitus    Type II  .  Diabetic neuropathy ([place])   . Diverticulitis   . GERD (gastroesophageal reflux disease)   . Headache   . History of bronchitis   . Hypercholesteremia   . Hypertension   . Hypokalemia   . PONV (postoperative nausea and vomiting)   . Shortness of breath dyspnea   . Sleep apnea   . Vertigo    patient reported    Patient Active Problem List   Diagnosis Date Noted  . Obesity, Class I, BMI 30-34.9 07/15/2018  . Nonintractable headache 03/27/2018  . Poorly controlled type 2 diabetes mellitus with circulatory disorder ([place]) 03/01/2016  . RBBB 03/02/2015  . Obstructive apnea 03/01/2015  . CAD (coronary artery disease) 07/08/2014  . Essential hypertension 07/08/2014  . Hyperlipidemia LDL goal <70 07/08/2014  . OSA on CPAP 07/08/2014  . Chest pain 07/08/2014  . Constipation 07/22/2013  . Yeast vaginitis 07/22/2013    Past Surgical History:  Procedure Laterality Date  . ABDOMINAL HYSTERECTOMY    . APPENDECTOMY    . CARDIAC CATHETERIZATION  2009   normal cath  . CARDIAC CATHETERIZATION  2004   normal cath  . CHOLECYSTECTOMY    . TONSILLECTOMY    . TONSILLECTOMY/ADENOIDECTOMY/TURBINATE REDUCTION Bilateral 03/01/2015   Procedure: TONSILLECTOMY/TURBINATE REDUCTION;  Surgeon: Rozetta Nunnery, MD;  Location: [facility];  Service: ENT;  Laterality: Bilateral;  . UVULOPALATOPHARYNGOPLASTY Bilateral 03/01/2015   Procedure: UVULOPALATOPHARYNGOPLASTY (UPPP);  Surgeon:  Rozetta Nunnery, MD;  Location: [facility];  Service: ENT;  Laterality: Bilateral;  . WISDOM TOOTH EXTRACTION       OB History   No obstetric history on file.     Family History  Problem Relation Age of Onset  . Diabetes Mother   . Gout Mother   . Hypertension Mother   . Arthritis Mother   . Heart disease Father   . Heart failure Sister   . Arthritis Sister   . Heart disease Maternal Grandmother   . Diabetes Maternal Grandmother   . Heart disease Paternal Grandmother   . Diabetes Paternal Grandmother   .  Diabetes Sister   . Bursitis Sister   . Crohn's disease Sister     Social History   Tobacco Use  . Smoking status: Former Smoker    Quit date: 08/07/1982    Years since quitting: 37.4  . Smokeless tobacco: Never Used  Substance Use Topics  . Alcohol use: Yes    Comment: occasional / social  . Drug use: No    Home Medications Prior to Admission medications   Medication Sig Start Date End Date Taking? Authorizing Provider  amLODipine (NORVASC) 5 MG tablet TAKE 1 TABLET (5 MG TOTAL) BY MOUTH DAILY. Patient taking differently: Take 5 mg by mouth daily.  04/28/19  Yes Troy Sine, MD  aspirin EC 81 MG tablet Take 81 mg by mouth daily.   Yes [provider]  atorvastatin (LIPITOR) 20 MG tablet Take 20 mg by mouth every evening.  12/30/18  Yes [provider]  b complex vitamins capsule Take 1 capsule by mouth daily.   Yes [provider]  celecoxib (CELEBREX) 200 MG capsule Take 200 mg by mouth daily as needed.    Yes [provider]  diclofenac sodium (VOLTAREN) 1 % GEL Apply 2 g topically 4 (four) times daily as needed for pain. 12/08/18  Yes [provider]  glipiZIDE (GLUCOTROL XL) 5 MG 24 hr tablet Take 1 tablet (5 mg total) by mouth 2 (two) times daily. 07/29/19  Yes Philemon Kingdom, MD  metoprolol succinate (TOPROL XL) 25 MG 24 hr tablet Take 1 tablet (25 mg total) by mouth daily. 07/02/19  Yes Troy Sine, MD  Multiple Vitamin (MULTIVITAMIN) capsule Take 1 capsule by mouth daily.   Yes [provider]  Semaglutide,0.25 or 0.5MG/DOS, (OZEMPIC, 0.25 OR 0.5 MG/DOSE,) 2 MG/1.5ML SOPN Inject 0.5 mg into the skin once a week. 11/25/19  Yes Philemon Kingdom, MD  valACYclovir (VALTREX) 500 MG tablet Take 500 mg by mouth See admin instructions. Takes Mon,Tues,Wed bid 05/24/16  Yes [provider]  ACCU-CHEK SMARTVIEW test strip USE AS INSTRUCTED TO CHECK SUGAR 2 TIMES DAILY 06/23/19   Philemon Kingdom, MD  Blood Glucose  Monitoring Suppl (ACCU-CHEK NANO SMARTVIEW) w/Device KIT USE TO CHECK SUGAR DAILY. 02/05/17   Philemon Kingdom, MD  Insulin Pen Needle 32G X 4 MM MISC USE EVERY DAY WITH DIABETIC INJECTABLES 09/30/18   Philemon Kingdom, MD    Allergies    Other and Latex  Review of Systems   Review of Systems  Constitutional: Negative for chills, diaphoresis, fatigue and fever.  HENT: Negative for congestion, facial swelling, sore throat and trouble swallowing.   Eyes: Negative for pain and visual disturbance.  Respiratory: Negative for cough, shortness of breath and wheezing.   Cardiovascular: Negative for chest pain, palpitations and leg swelling.  Gastrointestinal: Negative for abdominal distention, abdominal pain, diarrhea, nausea and vomiting.  Genitourinary: Negative for difficulty urinating, flank pain and hematuria.  Musculoskeletal: Negative for back pain, joint swelling, myalgias, neck pain and neck stiffness.  Skin: Negative for pallor.  Neurological: Positive for headaches. Negative for dizziness, seizures, facial asymmetry, speech difficulty, weakness, light-headedness and numbness.  Psychiatric/Behavioral: Negative for confusion, decreased concentration and hallucinations.    Physical Exam Updated Vital Signs BP (!) 143/99   Pulse 76   Temp 97.9 F (36.6 C) (Oral)   Resp 15   SpO2 99%   Physical Exam Constitutional:      General: She is not in acute distress.    Appearance: Normal appearance. She is not ill-appearing, toxic-appearing or diaphoretic.  HENT:     Head:     Comments: PERRLA.  Normal EOMs.  Patient without any facial tenderness, no maxillary or frontal sinus tenderness.  No trismus, jaw occlusion is normal.  No tenderness to jaw.  No signs of temporal arteritis.    Mouth/Throat:     Mouth: Mucous membranes are moist.     Pharynx: Oropharynx is clear.  Eyes:     General: No visual field deficit or scleral icterus.    Extraocular Movements: Extraocular movements  intact.     Right eye: Normal extraocular motion and no nystagmus.     Left eye: Normal extraocular motion and no nystagmus.     Pupils: Pupils are equal, round, and reactive to light. Pupils are equal.     Right eye: Pupil is round and reactive.     Left eye: Pupil is round and reactive.  Neck:     Meningeal: Kernig's sign absent.  Cardiovascular:     Rate and Rhythm: Normal rate and regular rhythm.     Pulses: Normal pulses.     Heart sounds: Normal heart sounds.  Pulmonary:     Effort: Pulmonary effort is normal. No respiratory distress.     Breath sounds: Normal breath sounds. No stridor. No wheezing, rhonchi or rales.  Chest:     Chest wall: No tenderness.  Abdominal:     General: Abdomen is flat. There is no distension.     Palpations: Abdomen is soft.     Tenderness: There is no abdominal tenderness. There is no guarding or rebound.  Musculoskeletal:        General: No swelling or tenderness. Normal range of motion.     Cervical back: Normal range of motion and neck supple. No rigidity.     Right lower leg: No edema.     Left lower leg: No edema.  Lymphadenopathy:     Cervical: No cervical adenopathy.  Skin:    General: Skin is warm and dry.     Capillary Refill: Capillary refill takes less than 2 seconds.     Coloration: Skin is not pale.  Neurological:     General: No focal deficit present.     Mental Status: She is alert and oriented to person, place, and time. Mental status is at baseline.     GCS: GCS eye subscore is 4. GCS verbal subscore is 5. GCS motor subscore is 6.     Cranial Nerves: No cranial nerve deficit, dysarthria or facial asymmetry.     Sensory: No sensory deficit.     Motor: No weakness.     Coordination: Romberg sign negative. Coordination normal.     Gait: Gait normal.  Psychiatric:        Mood and Affect: Mood normal. Mood is not anxious.  Behavior: Behavior normal.     ED Results / Procedures / Treatments   Labs (all labs ordered  are listed, but only abnormal results are displayed) Labs Reviewed  COMPREHENSIVE METABOLIC PANEL - Abnormal; Notable for the following components:      Result Value   Glucose, Bld 161 (*)    Alkaline Phosphatase 143 (*)    All other components within normal limits  CBC    EKG None  Radiology CT Head Wo Contrast  Result Date: 01/05/2020 CLINICAL DATA:  64 year old with new onset of headache. EXAM: CT HEAD WITHOUT CONTRAST TECHNIQUE: Contiguous axial images were obtained from the base of the skull through the vertex without intravenous contrast. COMPARISON:  Head CT 01/16/2016 FINDINGS: Brain: No evidence for acute hemorrhage, mass lesion, midline shift, hydrocephalus or large infarct. Vascular: No hyperdense vessel or unexpected calcification. Skull: Normal. Negative for fracture or focal lesion. Sinuses/Orbits: No acute finding. Other: 6 mm nodular structure in the anterior left parotid gland has not significantly changed since 2017. This is indeterminate but could represent a small lymph node. IMPRESSION: 1. No acute intracranial abnormality. 2. Stable small nodule along the anterior left parotid gland. This could represent a small lymph node but indeterminate. Likely an incidental finding based on the minimal change since 2017. Electronically Signed   By: Markus Daft M.D.   On: 01/05/2020 11:27    Procedures Procedures (including critical care time)  Medications Ordered in ED Medications  diphenhydrAMINE (BENADRYL) injection 25 mg (25 mg Intramuscular Given 01/05/20 1205)  metoCLOPramide (REGLAN) injection 10 mg (10 mg Intramuscular Given 01/05/20 1205)  ketorolac (TORADOL) 30 MG/ML injection 30 mg (30 mg Intramuscular Given 01/05/20 1205)    ED Course  I have reviewed the triage vital signs and the nursing notes.  Pertinent labs & imaging results that were available during my care of the patient were reviewed by me and considered in my medical decision making (see chart for  details).    MDM Rules/Calculators/A&P                     Taytum Nori Gordon is a 64 y.o. female with pertinent past medical history of anxiety, bipolar disorder, depression, diabetes, migraines, OSA on CPAP, right bundle branch block, obesity that presents the emergency department today for headache.  Patient with new type of headache, has been present for 3 months.  Will get CT scan at this time since patient states she has not had one since her symptoms began.  Normal neuro exam.  Patient is afebrile with normal vitals.  No meningeal signs.  Will give migraine cocktail once CT resolves.  .Differential diagnoses considered include Emergent considerations for headache include subarachnoid hemorrhage, meningitis, temporal arteritis, glaucoma, cerebral ischemia, carotid/vertebral dissection, intracranial tumor, Venous sinus thrombosis, carbon monoxide poisoning, acute or chronic subdural hemorrhage.  Other considerations include: Migraine, Cluster headache, Hypertension, Caffeine, alcohol, or drug withdrawal, Pseudotumor cerebri, Arteriovenous malformation, Head injury, Neurocysticercosis, Post-lumbar puncture, Preeclampsia, Tension headache, Sinusitis, Cervical arthritis, Refractive error causing strain, Dental abscess, Otitis media, Temporomandibular joint syndrome, Depression, Somatoform disorder (eg, somatization) Trigeminal neuralgia, Glossopharyngeal neuralgia.  Labs demonstrated stable CBC and CMP,CT scan with no acute intracranial abnormality.  Will discuss incidental finding on dispo.  Will give migraine cocktail this time.  Upon reassessment patient states that she feels  better with migraine cocktail.  Patient wants to leave.  Given the above findings, my suspicion is that patient has headache most likely due to medication overuse headache or headache secondary  to Covid, no red flag headache symptoms. Patient is nontoxic appearing with stable vital signs. Patient has had headache for 3 months, did  not have thunderclap headache.  Headache has been stable and similar for the last 3 months.  Non concerning for [facility], [facility], ischemic CVA, dural venous sinus thrombosis, acute glaucoma, giant cell arteritis, mass, or meningitis. Pt is afebrile with no focal neuro deficits, dizziness, change in vision, proptosis, or nuchal rigidity. I discussed treatment plan, need for PCP follow-up, and return precautions with the patient. Provided opportunity for questions, patient confirmed understanding and is in agreement with plan. Will provide information for neurology for patient to follow up with and provide guides on chronic HA. Pt agreeable for discharge.   I discussed this case with my attending physician who cosigned this note including patient's presenting symptoms, physical exam, and planned diagnostics and interventions. Attending physician stated agreement with plan or made changes to plan which were implemented.    Final Clinical Impression(s) / ED Diagnoses Final diagnoses:  Chronic tension-type headache, not intractable  Bad headache    Rx / DC Orders ED Discharge Orders    None       Alfredia Client, PA-C 01/05/20 1856    Alfredia Client, PA-C 01/05/20 1857    Charlesetta Shanks, MD 01/14/20 1410

## 2020-01-05 NOTE — Discharge Instructions (Addendum)
You are seen today for headache.  Your lab work and CT scan of your head look reassuring.  There was a an incidental finding radiologist read it as stable small nodule along the anterior left parotid gland. Thisould represent a small lymph node but indeterminate.  There has been minimal change since 2017.  You can talk to your primary care about this.  I want you to follow-up with neurologist for your chronic headaches.  Try to  NOT take ibuprofen daily for your headaches.  Use a guide attached.  Come back to the urgency department if you have worsening headache, weakness, vision changes, neck stiffness, fever, shortness breath, chest pain.

## 2020-01-05 NOTE — ED Notes (Signed)
Family at bedside. 

## 2020-01-05 NOTE — ED Triage Notes (Signed)
Pt reports constant headache for a month and sweating and being constantly hot for a month

## 2020-01-05 NOTE — ED Notes (Signed)
ED Provider at bedside. 

## 2020-01-12 ENCOUNTER — Ambulatory Visit (HOSPITAL_BASED_OUTPATIENT_CLINIC_OR_DEPARTMENT_OTHER): Payer: Medicare HMO

## 2020-01-21 ENCOUNTER — Ambulatory Visit (HOSPITAL_BASED_OUTPATIENT_CLINIC_OR_DEPARTMENT_OTHER): Payer: Medicare HMO

## 2020-03-03 ENCOUNTER — Ambulatory Visit (HOSPITAL_BASED_OUTPATIENT_CLINIC_OR_DEPARTMENT_OTHER)
Admission: RE | Admit: 2020-03-03 | Discharge: 2020-03-03 | Disposition: A | Discharge status: Home / Self Care | Payer: Medicare HMO | Source: Ambulatory Visit | Attending: Family Medicine | Admitting: Family Medicine

## 2020-03-03 ENCOUNTER — Other Ambulatory Visit: Payer: Self-pay

## 2020-03-03 DIAGNOSIS — Z1231 Encounter for screening mammogram for malignant neoplasm of breast: Secondary | ICD-10-CM | POA: Insufficient documentation

## 2020-04-06 ENCOUNTER — Encounter: Payer: Self-pay | Admitting: Internal Medicine

## 2020-04-06 ENCOUNTER — Other Ambulatory Visit: Payer: Self-pay

## 2020-04-06 ENCOUNTER — Ambulatory Visit (INDEPENDENT_AMBULATORY_CARE_PROVIDER_SITE_OTHER): Payer: Medicare HMO | Admitting: Internal Medicine

## 2020-04-06 VITALS — BP 130/80 | HR 61 | Ht 66.0 in | Wt 185.0 lb

## 2020-04-06 DIAGNOSIS — E669 Obesity, unspecified: Secondary | ICD-10-CM

## 2020-04-06 DIAGNOSIS — E785 Hyperlipidemia, unspecified: Secondary | ICD-10-CM | POA: Diagnosis not present

## 2020-04-06 DIAGNOSIS — E1165 Type 2 diabetes mellitus with hyperglycemia: Secondary | ICD-10-CM | POA: Diagnosis not present

## 2020-04-06 DIAGNOSIS — E1159 Type 2 diabetes mellitus with other circulatory complications: Secondary | ICD-10-CM | POA: Diagnosis not present

## 2020-04-06 LAB — POCT GLYCOSYLATED HEMOGLOBIN (HGB A1C): Hemoglobin A1C: 7.2 % — AB (ref 4.0–5.6)

## 2020-04-06 NOTE — Patient Instructions (Addendum)
Please  continue: - Glipizide ER 5 mg before breakfast and dinner - Ozempic 0.5 mg weekly.  Please return in 3-4 months with your sugar log.

## 2020-04-06 NOTE — Addendum Note (Signed)
Addended by: Darliss Ridgel I on: 04/06/2020 10:19 AM   Modules accepted: Orders

## 2020-04-06 NOTE — Progress Notes (Signed)
Patient ID: Bonnie Gordon, female   DOB: 1956/01/31, 64 y.o.   MRN: [id number]  This visit occurred during the SARS-CoV-2 public health emergency.  Safety protocols were in place, including screening questions prior to the visit, additional usage of staff PPE, and extensive cleaning of exam room while observing appropriate contact time as indicated for disinfecting solutions.   HPI: Bonnie Gordon is a 64 y.o.-year-old female, presenting for follow-up for DM2, dx in 2012, insulin-independent, uncontrolled, with complications (CAD, PN). Last visit  4 months ago.  At last visit she restarted drinking sodas and having gummy vitamins.  I advised her to stop both.  She got married since last visit and she was very busy planning her wedding (earlier this month).she has not been exercising due to being too busy but plans to restart.  Reviewed HbA1c levels: Lab Results  Component Value Date   HGBA1C 8.3 (A) 11/25/2019   HGBA1C 7.1 (A) 07/29/2019   HGBA1C 7.7 (A) 04/01/2019   HGBA1C 7.5 (A) 07/15/2018   HGBA1C 8.1 08/24/2017   HGBA1C 6.8 04/25/2017   HGBA1C 6.5 01/18/2017   HGBA1C 6.2 10/18/2016   HGBA1C 7.5 07/20/2016   HGBA1C 9.5 04/20/2016   HGBA1C 8.1 (H) 08/30/2015   HGBA1C 7.1 (H) 02/25/2015   HGBA1C 7.0 (H) 08/26/2014   HGBA1C (H) 09/16/2009    6.5 (NOTE) The ADA recommends the following therapeutic goal for glycemic control related to Hgb A1c measurement: Goal of therapy: <6.5 Hgb A1c  Reference: American Diabetes Association: Clinical Practice Recommendations 2010, Diabetes Care, 2010, 33: (Suppl  1).  11/2018: HbA1c 7.2% 01/14/2016: HbA1c 11.3%  Patient is on: - Glipizide ER 5 mg before breakfast and dinner - Victoza 1.8 mg at night >> moved to a.m. >> Ozempic 0.5 mg weekly She was also on: - Levemir up to 60 units at bedtime-stopped 11/2018 - Glipizide 5 mg before dinner >> stopped  - Glimepiride 4 mg before b'fast/ Glipizide XL 10 mg in am  - Tradjenta 5 mg in am  These were  stopped when she started insulin. She was on Metformin >> diarrhea.  Pt checks her sugars twice a day: - am: 94, 100-120, 137 >> 94, 100, 124-134, 179 >> 94-165 - 2h after b'fast: 127 >> n/c >> 126 >> n/c - before lunch: 100, 120, 153 >> n/c >> 64 >> n/c - 2h after lunch: n/c >> 167 (cake) >> n/c - before dinner: 68, 113 >> 80-120 >> n/c >> 130 >> n/c - 2h after dinner: n/c >> 196 x1 >> n/c - bedtime: 120 >> 115-120, 160 >> 118-134 >> 140-185 - nighttime: n/c Lowest sugar was 64 >> ...  92 >> 94; it is unclear at which level she has hypoglycemia awareness. Highest sugar was 409 (?) >> 185  Glucometer: AccuChek [employer]iva  Meals: fish  - fried - once a month when she eats out; no red meat.  Salads with ranch dressing.  -No CKD, last BUN/creatinine: Lab Results  Component Value Date   BUN 11 01/05/2020   BUN 6 (L) 07/29/2019   CREATININE 0.67 01/05/2020   CREATININE 0.72 (L) 07/29/2019  01/13/2016: 12/0.83, glucose 472  -+ HL; last set of lipids: Lab Results  Component Value Date   CHOL 148 07/29/2019   HDL 56 07/29/2019   LDLCALC 65 07/29/2019   TRIG 160 (H) 07/29/2019   CHOLHDL 2.6 07/29/2019  01/13/2016: 147/218/73/69 On atorvastatin.  Previously on Crestor. - last eye exam was in 06/2019: Reportedly no DR, + incipient cataract.  Started to see [facility]. -She has numbness and tingling in her feet.  She sees podiatry.  Previously on Neurontin. I suggested alpha-lipoic acid and B complex in the past.  Latest TSH was normal: 01/13/2016: TSH 1.39 Lab Results  Component Value Date   TSH 0.835 08/30/2015   She was admitted with CP 05/2016 (found to have hypokalemia, mm cramp).  She has mild nonobstructive CAD.  She will be a trial for study for nausea in pts with DM.  ROS: Constitutional: no weight gain/no weight loss, no fatigue, no subjective hyperthermia, no subjective hypothermia Eyes: no blurry vision, no xerophthalmia ENT: no sore throat, no nodules palpated in  neck, + dysphagia (reflux), no odynophagia, no hoarseness Cardiovascular: no CP/no SOB/no palpitations/no leg swelling Respiratory: + cough/no SOB/no wheezing Gastrointestinal: no N/no V/no D/no C/+ acid reflux Musculoskeletal: no muscle aches/no joint aches Skin: no rashes, no hair loss Neurological: no tremors/+ numbness/+ tingling/no dizziness, + am HAs  I reviewed pt's medications, allergies, PMH, social hx, family hx, and changes were documented in the history of present illness. Otherwise, unchanged from my initial visit note.  Past Medical History:  Diagnosis Date  . Anxiety   . Bipolar disorder ([place])   . Chest pain    a. 01/2003 MV w/ ? ant-inflat ischemia-->Cath: LAD 20p, otw nonobs dzs-->Med Rx; b. 2009 Cath: LAD 30, LCX nl/nondominant, RCA 30, nl EF; c. 08/2014 MV: no ischemia/infarct.  . Complication of anesthesia    pt. states she is difficult to wake up  . Depression   . Diabetes mellitus    Type II  . Diabetic neuropathy ([place])   . Diverticulitis   . GERD (gastroesophageal reflux disease)   . Headache   . History of bronchitis   . Hypercholesteremia   . Hypertension   . Hypokalemia   . PONV (postoperative nausea and vomiting)   . Shortness of breath dyspnea   . Sleep apnea   . Vertigo    patient reported   Past Surgical History:  Procedure Laterality Date  . ABDOMINAL HYSTERECTOMY    . APPENDECTOMY    . CARDIAC CATHETERIZATION  2009   normal cath  . CARDIAC CATHETERIZATION  2004   normal cath  . CHOLECYSTECTOMY    . TONSILLECTOMY    . TONSILLECTOMY/ADENOIDECTOMY/TURBINATE REDUCTION Bilateral 03/01/2015   Procedure: TONSILLECTOMY/TURBINATE REDUCTION;  Surgeon: Rozetta Nunnery, MD;  Location: [facility];  Service: ENT;  Laterality: Bilateral;  . UVULOPALATOPHARYNGOPLASTY Bilateral 03/01/2015   Procedure: UVULOPALATOPHARYNGOPLASTY (UPPP);  Surgeon: Rozetta Nunnery, MD;  Location: [facility];  Service: ENT;  Laterality: Bilateral;  . WISDOM TOOTH EXTRACTION      Social History   Social History  . Marital status: Divorced    Spouse name: N/A  . Number of children: 2   Occupational History  . Disability    Social History Main Topics  . Smoking status: Former Smoker    Quit date: 08/07/1982  . Smokeless tobacco: Never Used  . Alcohol use Yes     Comment: Drinks during the weekends 1 beer or 1 drink of liquor per day   . Drug use: No  . Sexual activity: Yes    Partners: Male    Birth control/ protection: Surgical   Current Outpatient Medications on File Prior to Visit  Medication Sig Dispense Refill  . ACCU-CHEK SMARTVIEW test strip USE AS INSTRUCTED TO CHECK SUGAR 2 TIMES DAILY 200 strip 5  . amLODipine (NORVASC) 5 MG tablet TAKE 1 TABLET (5 MG TOTAL)  BY MOUTH DAILY. (Patient taking differently: Take 5 mg by mouth daily. ) 90 tablet 3  . aspirin EC 81 MG tablet Take 81 mg by mouth daily.    Marland Kitchen atorvastatin (LIPITOR) 20 MG tablet Take 20 mg by mouth every evening.     Marland Kitchen b complex vitamins capsule Take 1 capsule by mouth daily.    . Blood Glucose Monitoring Suppl (ACCU-CHEK NANO SMARTVIEW) w/Device KIT USE TO CHECK SUGAR DAILY. 1 kit 0  . celecoxib (CELEBREX) 200 MG capsule Take 200 mg by mouth daily as needed.     . diclofenac sodium (VOLTAREN) 1 % GEL Apply 2 g topically 4 (four) times daily as needed for pain.    Marland Kitchen glipiZIDE (GLUCOTROL XL) 5 MG 24 hr tablet Take 1 tablet (5 mg total) by mouth 2 (two) times daily. 180 tablet 3  . Insulin Pen Needle 32G X 4 MM MISC USE EVERY DAY WITH DIABETIC INJECTABLES 100 each 2  . metoprolol succinate (TOPROL XL) 25 MG 24 hr tablet Take 1 tablet (25 mg total) by mouth daily. 90 tablet 3  . Multiple Vitamin (MULTIVITAMIN) capsule Take 1 capsule by mouth daily.    . Semaglutide,0.25 or 0.5MG/DOS, (OZEMPIC, 0.25 OR 0.5 MG/DOSE,) 2 MG/1.5ML SOPN Inject 0.5 mg into the skin once a week. 3 pen 5  . valACYclovir (VALTREX) 500 MG tablet Take 500 mg by mouth See admin instructions. Takes Mon,Tues,Wed bid     No  current facility-administered medications on file prior to visit.   Allergies  Allergen Reactions  . Other Other (See Comments)    No Blood Products, personal preference  . Latex Itching and Rash   Family History  Problem Relation Age of Onset  . Diabetes Mother   . Gout Mother   . Hypertension Mother   . Arthritis Mother   . Heart disease Father   . Heart failure Sister   . Arthritis Sister   . Heart disease Maternal Grandmother   . Diabetes Maternal Grandmother   . Heart disease Paternal Grandmother   . Diabetes Paternal Grandmother   . Diabetes Sister   . Bursitis Sister   . Crohn's disease Sister    PE: BP 130/80   Pulse 61   Ht _0  (1.676 m)   Wt 185 lb (83.9 kg)   SpO2 96%   BMI 29.86 kg/m  Wt Readings from Last 3 Encounters:  04/06/20 185 lb (83.9 kg)  12/02/19 186 lb 9.6 oz (84.6 kg)  11/25/19 185 lb (83.9 kg)   Constitutional: overweight, in NAD Eyes: PERRLA, EOMI, no exophthalmos ENT: moist mucous membranes, no thyromegaly, no cervical lymphadenopathy Cardiovascular: RRR, No MRG Respiratory: CTA B Gastrointestinal: abdomen soft, NT, ND, BS+ Musculoskeletal: no deformities, strength intact in all 4 Skin: moist, warm, no rashes Neurological: no tremor with outstretched hands, DTR normal in all 4  ASSESSMENT: 1. DM2, insulin-independent now, uncontrolled, with complications - CAD - PN  2. HL  3.  Obesity class I  PLAN:  1. Patient with longstanding, uncontrolled, type 2 diabetes, with improved control at the beginning of this year with an HbA1c of 7.1%, however, with worse control at last visit after she started to drink regular sodas and eat gummy vitamins.  I advised her to stop this completely.  At that time, HbA1c was 8.3% and her sugars are higher in the morning and also later in the day, but these were still at goal.  At that time, I advised her to switch from  Victoza to Ozempic.  We discussed that if this is not affordable, we may need to  increase glipizide in the morning.  At this visit, she tells me that she was able to start [place] and she is tolerating it well, without GI side effects. -At this visit, sugars are little bit more variable than before based on her recall (she does not bring a log or meter).  However, they are still close to goal.  She occasionally has high blood sugar in the morning when eating a late dinner or having sweets at night.  She is trying to limit these.  She is also planning to restart walking.  She does mention that she had a busy summer and got married earlier this month.  Her sugars have been a little bit higher than before.  Therefore, for now, I would like to not make any changes in her regimen but at next visit we may need to increase the Ozempic dose if sugars are not at goal. - I suggested to:  Patient Instructions  Please  continue: - Glipizide ER 5 mg before breakfast and dinner - Ozempic 0.5 mg weekly.  Please return in 3-4 months with your sugar log.   - we checked her HbA1c: 7.2% (improved) - advised to check sugars at different times of the day - 1-2x a day, rotating check times - advised for yearly eye exams >> she is UTD - return to clinic in 3-4 months  2. HL -Reviewed latest lipid panel from 07/2019: LDL at goal, lower than 70, triglycerides slightly high: Lab Results  Component Value Date   CHOL 148 07/29/2019   HDL 56 07/29/2019   LDLCALC 65 07/29/2019   TRIG 160 (H) 07/29/2019   CHOLHDL 2.6 07/29/2019  -Continues Lipitor without side effects  3.  Obesity class I -Continue GLP-1 receptor agonist which should also help with weight loss.  We switched from Victoza to [place] and this should also help with weight loss -She did not lose weight since last visit  Philemon Kingdom, MD PhD [facility] Endocrinology

## 2020-04-08 ENCOUNTER — Telehealth: Payer: Self-pay | Admitting: Internal Medicine

## 2020-04-08 MED ORDER — ACCU-CHEK NANO SMARTVIEW W/DEVICE KIT: PACK | 0 refills | Status: DC

## 2020-04-08 NOTE — Telephone Encounter (Signed)
RX sent

## 2020-04-08 NOTE — Telephone Encounter (Signed)
Patient called re: Patient's Blood Glucose Monitoring Suppl (ACCU-CHEK NANO SMARTVIEW) w/Device KIT broke and patient requests a new RX for the above listed meter be sent to:  [id number]- [place], [place]illonPhone:  [phone] Fax:  [phone]

## 2020-05-20 ENCOUNTER — Other Ambulatory Visit: Payer: Self-pay | Admitting: Cardiovascular Disease

## 2020-07-05 ENCOUNTER — Other Ambulatory Visit: Payer: Self-pay | Admitting: Internal Medicine

## 2020-07-16 ENCOUNTER — Telehealth: Payer: Self-pay | Admitting: Cardiovascular Disease

## 2020-07-16 NOTE — Telephone Encounter (Signed)
Returned call to patient she stated she has been having chest pain off and on for the past 2 months.Stated she had a episode of chest pain this morning that woke her up.Stated pain radiated up into neck and pain in right arm.Stated she is presently not having chest pain.Stated she is having pain in right arm off and on.Appointment scheduled with Edd Fabian NP 12/15 at 3:15 pm.Advised if she continues to have arm pain and if she has any more chest pain she needs to go to ED.

## 2020-07-16 NOTE — Telephone Encounter (Signed)
Pt c/o of Chest Pain: STAT if CP now or developed within 24 hours  1. Are you having CP right now? No  2. Are you experiencing any other symptoms (ex. SOB, nausea, vomiting, sweating)? no  3. How long have you been experiencing CP? This morning    4. Is your CP continuous or coming and going? Came and went  5. Have you taken Nitroglycerin? No   Patient states she started having chest pain around 6:10 am and lasted about 35 minutes. She states she is not having the chest pain now, but that her chest is sore.  ?

## 2020-07-20 NOTE — Progress Notes (Deleted)
Cardiology Clinic Note   Patient Name: Makelle Marrone Date of Encounter: 07/20/2020  Primary Care Provider:  Lin Landsman, MD Primary Cardiologist:  Shelva Majestic, MD  Patient Profile    Bonnie Gordon 64 year old female presents the clinic today for evaluation of her chest pain.  Past Medical History    Past Medical History:  Diagnosis Date  . Anxiety   . Bipolar disorder ([place])   . Chest pain    a. 01/2003 MV w/ ? ant-inflat ischemia-->Cath: LAD 20p, otw nonobs dzs-->Med Rx; b. 2009 Cath: LAD 30, LCX nl/nondominant, RCA 30, nl EF; c. 08/2014 MV: no ischemia/infarct.  . Complication of anesthesia    pt. states she is difficult to wake up  . Depression   . Diabetes mellitus    Type II  . Diabetic neuropathy ([place])   . Diverticulitis   . GERD (gastroesophageal reflux disease)   . Headache   . History of bronchitis   . Hypercholesteremia   . Hypertension   . Hypokalemia   . PONV (postoperative nausea and vomiting)   . Shortness of breath dyspnea   . Sleep apnea   . Vertigo    patient reported   Past Surgical History:  Procedure Laterality Date  . ABDOMINAL HYSTERECTOMY    . APPENDECTOMY    . CARDIAC CATHETERIZATION  2009   normal cath  . CARDIAC CATHETERIZATION  2004   normal cath  . CHOLECYSTECTOMY    . TONSILLECTOMY    . TONSILLECTOMY/ADENOIDECTOMY/TURBINATE REDUCTION Bilateral 03/01/2015   Procedure: TONSILLECTOMY/TURBINATE REDUCTION;  Surgeon: Rozetta Nunnery, MD;  Location: [place];  Service: ENT;  Laterality: Bilateral;  . UVULOPALATOPHARYNGOPLASTY Bilateral 03/01/2015   Procedure: UVULOPALATOPHARYNGOPLASTY (UPPP);  Surgeon: Rozetta Nunnery, MD;  Location: [place];  Service: ENT;  Laterality: Bilateral;  . WISDOM TOOTH EXTRACTION      Allergies  Allergies  Allergen Reactions  . Other Other (See Comments)    No Blood Products, personal preference  . Latex Itching and Rash    History of Present Illness    Ms.Nori Gordon is a 64 y.o. female with a hx of CAD,  OSA on CPAP, HLD with LDL goal < 70, HTN, DM, RBBB, and obesity. She has a history of cath in 2004 and 2009 with nonobstructive disease, no intervention; normal myoview in 2016 and 2017. She was just seen in the ER 03/14/19 for chest pain and dyspnea. She ruled out with two negative high sensitivity troponins and no acute findings on EKG. Her O2 saturation was 100% on room air. Dr. Johnsie Cancel  Evaluated her in the ER anf elt she was stable for discharge with close cardiology follow up - possible outpatient CT coronary if symptoms persist. She follows with Dr. Claiborne Billings. She has OSA not compliant on CPAP.  She presented to the clinic for follow-up evaluation 03/26/2019.    She stated that she continued to have chest pain and dyspnea with exertion.  She stated that her chest pain and dyspnea had been getting worse over the last 5 years.  She was sleeping in a mechanical bed and stated that the head was elevated. She noticed that she sleeps better the higher the head of the bed is.  She continued to be active and dance in her house however, she stated that she noticed now she was only able to do about 15 minutes of activity at a time before she has to stop and take a break.  She did not take any medications to help with her  symptoms, she would rests and allow the symptoms to go away.  She went on to say that about 4 years ago her PCP prescribed an albuterol inhaler that helped with her shortness of breath.  However, she was instructed to stop taking this because it was believed that she did not have asthma.  Finally she stated at times she felt like she might faint and was dizzy, the symptoms are relieved by rest as well.  Her EKG at that time demonstrated normal sinus rhythm with right bundle branch block and no acute changes.  Her blood pressure was 120/80.  A coronary CTA was ordered and showed a calcium score of 92, 58 percentile for age and sex mass control.  Mild nonobstructive CAD in the proximal LAD, ramus intermediate  and LCx arteries.  Medical management was recommended along with risk factor modification.  She was seen by Dr. Claiborne Billings on 07/02/2019.  During that time she felt well.  She reported occasional sharp nonexertional chest pains that would last around 10 minutes.  She also reported shortness of breath with the episodes.  They fasting lipid panel was ordered and showed an LDL of 65.  Follow-up was planned for 6 months.  She was last seen by Fabian Sharp PA-C on 12/02/2019.  During that time she was recovering from her COVID-19 infection.  She was finishing her prednisone taper and reported that her chest pain and dyspnea had resolved.  Her hypertension was well controlled on her amlodipine and beta-blocker and her statin was continued.  Her OSA was felt to be mild and she was not on CPAP.  Ms. Nori Gordon contacted nurse triage line on 07/16/2020 and indicated that she has been having intermittent periods of chest pain for the last 2 months.  On the morning call she explained that her chest discomfort woke her up from her sleep and was radiating to her back and right arm.  She presents the clinic today for follow-up evaluation states***  *** denies chest pain, shortness of breath, lower extremity edema, fatigue, palpitations, melena, hematuria, hemoptysis, diaphoresis, weakness, presyncope, syncope, orthopnea, and PND.   Home Medications    Prior to Admission medications   Medication Sig Start Date End Date Taking? Authorizing Provider  ACCU-CHEK SMARTVIEW test strip USE AS INSTRUCTED TO CHECK SUGAR 2 TIMES DAILY 07/05/20   Philemon Kingdom, MD  amLODipine (NORVASC) 5 MG tablet TAKE 1 TABLET (5 MG TOTAL) BY MOUTH DAILY. 05/20/20   Ledora Bottcher, PA  aspirin EC 81 MG tablet Take 81 mg by mouth daily.    [provider]  atorvastatin (LIPITOR) 20 MG tablet Take 20 mg by mouth every evening.  12/30/18   [provider]  b complex vitamins capsule Take 1 capsule by mouth daily.    [provider]  Blood Glucose Monitoring Suppl (ACCU-CHEK NANO SMARTVIEW) w/Device KIT USE TO CHECK SUGAR DAILY. 04/08/20   Philemon Kingdom, MD  celecoxib (CELEBREX) 200 MG capsule Take 200 mg by mouth daily as needed.     [provider]  diclofenac sodium (VOLTAREN) 1 % GEL Apply 2 g topically 4 (four) times daily as needed for pain. 12/08/18   [provider]  glipiZIDE (GLUCOTROL XL) 5 MG 24 hr tablet Take 1 tablet (5 mg total) by mouth 2 (two) times daily. 07/29/19   Philemon Kingdom, MD  metoprolol succinate (TOPROL XL) 25 MG 24 hr tablet Take 1 tablet (25 mg total) by mouth daily. 07/02/19   Troy Sine,  MD  Multiple Vitamin (MULTIVITAMIN) capsule Take 1 capsule by mouth daily.    [provider]  Semaglutide,0.25 or 0.5MG/DOS, (OZEMPIC, 0.25 OR 0.5 MG/DOSE,) 2 MG/1.5ML SOPN Inject 0.5 mg into the skin once a week. 11/25/19   Philemon Kingdom, MD  valACYclovir (VALTREX) 500 MG tablet Take 500 mg by mouth See admin instructions. Takes Mon,Tues,Wed bid 05/24/16   [provider]    Family History    Family History  Problem Relation Age of Onset  . Diabetes Mother   . Gout Mother   . Hypertension Mother   . Arthritis Mother   . Heart disease Father   . Heart failure Sister   . Arthritis Sister   . Heart disease Maternal Grandmother   . Diabetes Maternal Grandmother   . Heart disease Paternal Grandmother   . Diabetes Paternal Grandmother   . Diabetes Sister   . Bursitis Sister   . Crohn's disease Sister    She indicated that her mother is deceased. She indicated that her father is deceased. She indicated that all of her three sisters are alive. She indicated that her maternal grandmother is deceased. She indicated that her maternal grandfather is deceased. She indicated that her paternal grandmother is deceased. She indicated that her paternal grandfather is deceased.  Social History    Social History   Socioeconomic History  . Marital  status: Divorced    Spouse name: Not on file  . Number of children: Not on file  . Years of education: some college  . Highest education level: Not on file  Occupational History  . Occupation: Retired Quarry manager  Tobacco Use  . Smoking status: Former Smoker    Quit date: 08/07/1982    Years since quitting: 37.9  . Smokeless tobacco: Never Used  Vaping Use  . Vaping Use: Never used  Substance and Sexual Activity  . Alcohol use: Yes    Comment: occasional / social  . Drug use: No  . Sexual activity: Yes    Partners: Male    Birth control/protection: Surgical  Other Topics Concern  . Not on file  Social History Narrative   Lives at home alone.   Right-handed.    1 cup caffeine per day.   Social Determinants of Health   Financial Resource Strain: Not on file  Food Insecurity: Not on file  Transportation Needs: Not on file  Physical Activity: Not on file  Stress: Not on file  Social Connections: Not on file  Intimate Partner Violence: Not on file     Review of Systems    General:  No chills, fever, night sweats or weight changes.  Cardiovascular:  No chest pain, dyspnea on exertion, edema, orthopnea, palpitations, paroxysmal nocturnal dyspnea. Dermatological: No rash, lesions/masses Respiratory: No cough, dyspnea Urologic: No hematuria, dysuria Abdominal:   No nausea, vomiting, diarrhea, bright red blood per rectum, melena, or hematemesis Neurologic:  No visual changes, wkns, changes in mental status. All other systems reviewed and are otherwise negative except as noted above.  Physical Exam    VS:  There were no vitals taken for this visit. , BMI There is no height or weight on file to calculate BMI. GEN: Well nourished, well developed, in no acute distress. HEENT: normal. Neck: Supple, no JVD, carotid bruits, or masses. Cardiac: RRR, no murmurs, rubs, or gallops. No clubbing, cyanosis, edema.  Radials/DP/PT 2+ and equal bilaterally.  Respiratory:  Respirations regular and  unlabored, clear to auscultation bilaterally. GI: Soft, nontender, nondistended, BS +  x 4. MS: no deformity or atrophy. Skin: warm and dry, no rash. Neuro:  Strength and sensation are intact. Psych: Normal affect.  Accessory Clinical Findings    Recent Labs: 01/05/2020: ALT 23; BUN 11; Creatinine, Ser 0.67; Hemoglobin 14.4; Platelets 178; Potassium 4.0; Sodium 142   Recent Lipid Panel    Component Value Date/Time   CHOL 148 07/29/2019 1047   TRIG 160 (H) 07/29/2019 1047   HDL 56 07/29/2019 1047   CHOLHDL 2.6 07/29/2019 1047   CHOLHDL 2.1 02/29/2016 0856   VLDL 19 02/29/2016 0856   LDLCALC 65 07/29/2019 1047    ECG personally reviewed by me today- *** - No acute changes  Coronary CT 04/07/2019 IMPRESSION: 1. Coronary calcium score of 92. This was 40 percentile for age and sex matched control.  2. Normal coronary origin with right dominance.  3. CAD-RADS 2. Mild non-obstructive CAD (25-49%) in the proximal portions of LAD, ramus intermedius and LCX arteries. Consider non-atherosclerotic causes of chest pain. Consider preventive therapy and risk factor modification.  Assessment & Plan   1.  Chest pain - coronary CTA showed nonobstructive CAD.  Coronary calcium score was 92.  Mild nonobstructive CAD 25-49% was noted in her proximal LAD, ramus intermedius and circumflex artery.  ***ED visit on 03/14/2019 for increased dyspnea and chest pain ruled out for MI.  Patient indicates dyspnea is getting worse with time and physical activity.  Continue amlodipine, aspirin, atorvastatin, metoprolol Heart healthy low-sodium diet-salty 6 given Increase physical activity as tolerated  2.  Hypertension-BP today 120/80***.  Well-controlled at home. Patient states she and her metoprolol a few years ago and has maintained her blood pressure.  BUN/creatinine stable. Continue amlodipine, metoprolol Heart healthy low-sodium diet Increase physical activity as tolerated  3.  Obstructive sleep  apnea-repeat study indicated she was low risk at 2.9 AHI.  Very mild sleep apnea with supine position at 5.1 and during her REM sleep at 7.2/H.  Recommendation was that if she gained more weight or became more sleepy a repeat study would be warranted.  Patient has lost about 4 pounds and is 188 today Continue weight loss Heart healthy low-sodium diet Increase physical activity as tolerated  4.  Hyperlipidemia-07/29/2019: Cholesterol, Total 148; HDL 56; LDL Chol Calc (NIH) 65; Triglycerides 160 Continue atorvastatin Increase physical activity as tolerated low-sodium heart healthy diet Monitored by PCP  Disposition: Follow-up with Dr. Claiborne Billings in 3 months.  Jossie Ng. Jakira Mcfadden NP-C    07/20/2020, 1:46 PM [facility] [place] [address] Office [phone] Fax [phone]  Notice: This dictation was prepared with Dragon dictation along with smaller phrase technology. Any transcriptional errors that result from this process are unintentional and may not be corrected upon review.

## 2020-07-21 ENCOUNTER — Ambulatory Visit: Payer: Medicare HMO | Admitting: General Practice

## 2020-08-05 ENCOUNTER — Emergency Department (HOSPITAL_COMMUNITY)
Admission: EM | Admit: 2020-08-05 | Discharge: 2020-08-05 | Disposition: A | Discharge status: Home / Self Care | Payer: Medicare HMO | Attending: Emergency Medicine | Admitting: Emergency Medicine

## 2020-08-05 ENCOUNTER — Encounter (HOSPITAL_COMMUNITY): Payer: Self-pay | Admitting: Emergency Medicine

## 2020-08-05 ENCOUNTER — Other Ambulatory Visit: Payer: Self-pay

## 2020-08-05 DIAGNOSIS — R197 Diarrhea, unspecified: Secondary | ICD-10-CM | POA: Diagnosis not present

## 2020-08-05 DIAGNOSIS — R1110 Vomiting, unspecified: Secondary | ICD-10-CM | POA: Diagnosis not present

## 2020-08-05 DIAGNOSIS — Z5321 Procedure and treatment not carried out due to patient leaving prior to being seen by health care provider: Secondary | ICD-10-CM | POA: Insufficient documentation

## 2020-08-05 DIAGNOSIS — R109 Unspecified abdominal pain: Secondary | ICD-10-CM | POA: Insufficient documentation

## 2020-08-05 LAB — COMPREHENSIVE METABOLIC PANEL
ALT: 54 U/L — ABNORMAL HIGH (ref 0–44)
AST: 31 U/L (ref 15–41)
Albumin: 3.8 g/dL (ref 3.5–5.0)
Alkaline Phosphatase: 127 U/L — ABNORMAL HIGH (ref 38–126)
Anion gap: 11 (ref 5–15)
BUN: 8 mg/dL (ref 8–23)
CO2: 26 mmol/L (ref 22–32)
Calcium: 9 mg/dL (ref 8.9–10.3)
Chloride: 103 mmol/L (ref 98–111)
Creatinine, Ser: 0.85 mg/dL (ref 0.44–1.00)
GFR, Estimated: >60 mL/min (ref >60)
Glucose, Bld: 124 mg/dL — ABNORMAL HIGH (ref 70–99)
Potassium: 3.4 mmol/L — ABNORMAL LOW (ref 3.5–5.1)
Sodium: 140 mmol/L (ref 135–145)
Total Bilirubin: 0.6 mg/dL (ref 0.3–1.2)
Total Protein: 7.3 g/dL (ref 6.5–8.1)

## 2020-08-05 LAB — CBC
HCT: 46.7 % — ABNORMAL HIGH (ref 36.0–46.0)
Hemoglobin: 15.1 g/dL — ABNORMAL HIGH (ref 12.0–15.0)
MCH: 28.4 pg (ref 26.0–34.0)
MCHC: 32.3 g/dL (ref 30.0–36.0)
MCV: 87.8 fL (ref 80.0–100.0)
Platelets: 192 10*3/uL (ref 150–400)
RBC: 5.32 MIL/uL — ABNORMAL HIGH (ref 3.87–5.11)
RDW: 13.2 % (ref 11.5–15.5)
WBC: 4.5 10*3/uL (ref 4.0–10.5)
nRBC: 0 % (ref 0.0–0.2)

## 2020-08-05 LAB — LIPASE, BLOOD: Lipase: 33 U/L (ref 11–51)

## 2020-08-05 NOTE — ED Notes (Signed)
Pt not answering for vital recheck  

## 2020-08-05 NOTE — ED Triage Notes (Signed)
Patient reports mid/low abdominal pain with emesis and diarrhea onset last night , denies fever or chills .

## 2020-08-05 NOTE — ED Notes (Signed)
Pt called multiple times for vital recheck no answer

## 2020-08-09 ENCOUNTER — Other Ambulatory Visit: Payer: Self-pay

## 2020-08-09 ENCOUNTER — Ambulatory Visit (INDEPENDENT_AMBULATORY_CARE_PROVIDER_SITE_OTHER): Payer: Medicare HMO | Admitting: Internal Medicine

## 2020-08-09 ENCOUNTER — Encounter: Payer: Self-pay | Admitting: Internal Medicine

## 2020-08-09 VITALS — BP 132/82 | HR 82 | Ht 66.0 in | Wt 187.2 lb

## 2020-08-09 DIAGNOSIS — E1165 Type 2 diabetes mellitus with hyperglycemia: Secondary | ICD-10-CM

## 2020-08-09 DIAGNOSIS — E1159 Type 2 diabetes mellitus with other circulatory complications: Secondary | ICD-10-CM | POA: Diagnosis not present

## 2020-08-09 DIAGNOSIS — E669 Obesity, unspecified: Secondary | ICD-10-CM | POA: Diagnosis not present

## 2020-08-09 DIAGNOSIS — E785 Hyperlipidemia, unspecified: Secondary | ICD-10-CM

## 2020-08-09 LAB — POCT GLYCOSYLATED HEMOGLOBIN (HGB A1C): Hemoglobin A1C: 6.9 % — AB (ref 4.0–5.6)

## 2020-08-09 NOTE — Patient Instructions (Signed)
Please  continue: - Glipizide ER 5 mg before breakfast and dinner - Ozempic 0.5 mg weekly.  Please return in 4 months with your sugar log.

## 2020-08-09 NOTE — Progress Notes (Signed)
Patient ID: Bonnie Gordon, female   DOB: 11-10-1955, 65 y.o.   MRN: [id number]  This visit occurred during the SARS-CoV-2 public health emergency.  Safety protocols were in place, including screening questions prior to the visit, additional usage of staff PPE, and extensive cleaning of exam room while observing appropriate contact time as indicated for disinfecting solutions.   HPI: Bonnie Gordon is a 65 y.o.-year-old female, presenting for follow-up for DM2, dx in 2012, insulin-independent, uncontrolled, with complications (CAD, PN). Last visit  3.5 mo ago.  Reviewed HbA1c levels: Lab Results  Component Value Date   HGBA1C 7.2 (A) 04/06/2020   HGBA1C 8.3 (A) 11/25/2019   HGBA1C 7.1 (A) 07/29/2019   HGBA1C 7.7 (A) 04/01/2019   HGBA1C 7.5 (A) 07/15/2018   HGBA1C 8.1 08/24/2017   HGBA1C 6.8 04/25/2017   HGBA1C 6.5 01/18/2017   HGBA1C 6.2 10/18/2016   HGBA1C 7.5 07/20/2016   HGBA1C 9.5 04/20/2016   HGBA1C 8.1 (H) 08/30/2015   HGBA1C 7.1 (H) 02/25/2015   HGBA1C 7.0 (H) 08/26/2014   HGBA1C (H) 09/16/2009    6.5 (NOTE) The ADA recommends the following therapeutic goal for glycemic control related to Hgb A1c measurement: Goal of therapy: <6.5 Hgb A1c  Reference: American Diabetes Association: Clinical Practice Recommendations 2010, Diabetes Care, 2010, 33: (Suppl  1).  11/2018: HbA1c 7.2% 01/14/2016: HbA1c 11.3%  Patient is on: - Glipizide ER 5 mg before breakfast and dinner - Victoza 1.8 mg at night >> moved to a.m. >> Ozempic 0.36m weekly She was also on: - Levemir up to 60 units at bedtime-stopped 11/2018 - Glipizide 5 mg before dinner >> stopped  - Glimepiride 4 mg before b'fast/ Glipizide XL 10 mg in am  - Tradjenta 5 mg in am  These were stopped when she started insulin. She was on Metformin >> diarrhea.  Pt checks her sugars twice a day: - am: 94, 100, 124-134, 179 >> 94-165 >> 120-132 - 2h after b'fast: 127 >> n/c >> 126 >> n/c - before lunch: 100, 120, 153 >>  n/c >> 64 >> n/c - 2h after lunch: n/c >> 167 (cake) >> n/c - before dinner: 68, 113 >> 80-120 >> n/c >> 130 >> n/c - 2h after dinner: n/c >> 196 x1 >> n/c - bedtime: 118-134 >> 140-185 >> 100-180s - nighttime: n/c Lowest sugar was 64 >> ...  92 >> 94 >> 85; it is unclear at which level she has hypoglycemia awareness. Highest sugar was 409 (?) >> 185 >> 180s.  Glucometer: AccuChek [employer]  Meals: fish  - fried - once a month when she eats out; no red meat.  Salads with ranch dressing.  -No CKD, last BUN/creatinine: Lab Results  Component Value Date   BUN 8 08/05/2020   BUN 11 01/05/2020   CREATININE 0.85 08/05/2020   CREATININE 0.67 01/05/2020  01/13/2016: 12/0.83, glucose 472  -+ HL; last set of lipids: Lab Results  Component Value Date   CHOL 148 07/29/2019   HDL 56 07/29/2019   LDLCALC 65 07/29/2019   TRIG 160 (H) 07/29/2019   CHOLHDL 2.6 07/29/2019  01/13/2016: 147/218/73/69 On Lipitor 20.  Previously on Crestor. - last eye exam was in 11/2019: Reportedly no DR, + incipient cataract. Started to see [facility]. -+ Numbness and tingling in her feet.  She sees podiatry.  Previously on Neurontin. I suggested alpha-lipoic acid and B complex in the past.  Latest TSH was normal 01/13/2016: TSH 1.39 Lab Results  Component Value Date   TSH 0.835  08/30/2015   She was admitted with CP 05/2016 (found to have hypokalemia, mm cramp). She has mild nonobstructive CAD.  She will be a trial for study for nausea in pts with DM.  Started GERD medication - Omeprazole. Now feeling better.  ROS: Constitutional: no weight gain/no weight loss, no fatigue, no subjective hyperthermia, no subjective hypothermia Eyes: no blurry vision, no xerophthalmia ENT: no sore throat, no nodules palpated in neck, no dysphagia, no odynophagia, no hoarseness Cardiovascular: no CP/no SOB/no palpitations/no leg swelling Respiratory: no cough/no SOB/no wheezing Gastrointestinal: no N/no V/no D/no C/no  acid reflux Musculoskeletal: no muscle aches/no joint aches Skin: no rashes, no hair loss Neurological: no tremors/+ numbness/+ tingling/no dizziness  I reviewed pt's medications, allergies, PMH, social hx, family hx, and changes were documented in the history of present illness. Otherwise, unchanged from my initial visit note.  Past Medical History:  Diagnosis Date  . Anxiety   . Bipolar disorder ([place])   . Chest pain    a. 01/2003 MV w/ ? ant-inflat ischemia-->Cath: LAD 20p, otw nonobs dzs-->Med Rx; b. 2009 Cath: LAD 30, LCX nl/nondominant, RCA 30, nl EF; c. 08/2014 MV: no ischemia/infarct.  . Complication of anesthesia    pt. states she is difficult to wake up  . Depression   . Diabetes mellitus    Type II  . Diabetic neuropathy ([place])   . Diverticulitis   . GERD (gastroesophageal reflux disease)   . Headache   . History of bronchitis   . Hypercholesteremia   . Hypertension   . Hypokalemia   . PONV (postoperative nausea and vomiting)   . Shortness of breath dyspnea   . Sleep apnea   . Vertigo    patient reported   Past Surgical History:  Procedure Laterality Date  . ABDOMINAL HYSTERECTOMY    . APPENDECTOMY    . CARDIAC CATHETERIZATION  2009   normal cath  . CARDIAC CATHETERIZATION  2004   normal cath  . CHOLECYSTECTOMY    . TONSILLECTOMY    . TONSILLECTOMY/ADENOIDECTOMY/TURBINATE REDUCTION Bilateral 03/01/2015   Procedure: TONSILLECTOMY/TURBINATE REDUCTION;  Surgeon: Rozetta Nunnery, MD;  Location: [place];  Service: ENT;  Laterality: Bilateral;  . UVULOPALATOPHARYNGOPLASTY Bilateral 03/01/2015   Procedure: UVULOPALATOPHARYNGOPLASTY (UPPP);  Surgeon: Rozetta Nunnery, MD;  Location: [place];  Service: ENT;  Laterality: Bilateral;  . WISDOM TOOTH EXTRACTION     Social History   Social History  . Marital status: Divorced    Spouse name: N/A  . Number of children: 2   Occupational History  . Disability    Social History Main Topics  . Smoking status: Former  Smoker    Quit date: 08/07/1982  . Smokeless tobacco: Never Used  . Alcohol use Yes     Comment: Drinks during the weekends 1 beer or 1 drink of liquor per day   . Drug use: No  . Sexual activity: Yes    Partners: Male    Birth control/ protection: Surgical   Current Outpatient Medications on File Prior to Visit  Medication Sig Dispense Refill  . ACCU-CHEK SMARTVIEW test strip USE AS INSTRUCTED TO CHECK SUGAR 2 TIMES DAILY 200 strip 5  . amLODipine (NORVASC) 5 MG tablet TAKE 1 TABLET (5 MG TOTAL) BY MOUTH DAILY. 90 tablet 3  . aspirin EC 81 MG tablet Take 81 mg by mouth daily.    Marland Kitchen atorvastatin (LIPITOR) 20 MG tablet Take 20 mg by mouth every evening.     Marland Kitchen b complex vitamins capsule  Take 1 capsule by mouth daily.    . Blood Glucose Monitoring Suppl (ACCU-CHEK NANO SMARTVIEW) w/Device KIT USE TO CHECK SUGAR DAILY. 1 kit 0  . celecoxib (CELEBREX) 200 MG capsule Take 200 mg by mouth daily as needed.     . diclofenac sodium (VOLTAREN) 1 % GEL Apply 2 g topically 4 (four) times daily as needed for pain.    Marland Kitchen glipiZIDE (GLUCOTROL XL) 5 MG 24 hr tablet Take 1 tablet (5 mg total) by mouth 2 (two) times daily. 180 tablet 3  . metoprolol succinate (TOPROL XL) 25 MG 24 hr tablet Take 1 tablet (25 mg total) by mouth daily. 90 tablet 3  . Multiple Vitamin (MULTIVITAMIN) capsule Take 1 capsule by mouth daily.    . Semaglutide,0.25 or 0.5MG/DOS, (OZEMPIC, 0.25 OR 0.5 MG/DOSE,) 2 MG/1.5ML SOPN Inject 0.5 mg into the skin once a week. 3 pen 5  . valACYclovir (VALTREX) 500 MG tablet Take 500 mg by mouth See admin instructions. Takes Mon,Tues,Wed bid     No current facility-administered medications on file prior to visit.   Allergies  Allergen Reactions  . Other Other (See Comments)    No Blood Products, personal preference  . Latex Itching and Rash   Family History  Problem Relation Age of Onset  . Diabetes Mother   . Gout Mother   . Hypertension Mother   . Arthritis Mother   . Heart disease  Father   . Heart failure Sister   . Arthritis Sister   . Heart disease Maternal Grandmother   . Diabetes Maternal Grandmother   . Heart disease Paternal Grandmother   . Diabetes Paternal Grandmother   . Diabetes Sister   . Bursitis Sister   . Crohn's disease Sister    PE: BP 132/82   Pulse 82   Ht _0  (1.676 m)   Wt 187 lb 3.2 oz (84.9 kg)   SpO2 98%   BMI 30.21 kg/m  Wt Readings from Last 3 Encounters:  08/09/20 187 lb 3.2 oz (84.9 kg)  08/05/20 182 lb (82.6 kg)  04/06/20 185 lb (83.9 kg)   Constitutional: overweight, in NAD Eyes: PERRLA, EOMI, no exophthalmos ENT: moist mucous membranes, no thyromegaly, no cervical lymphadenopathy Cardiovascular: RRR, No MRG Respiratory: CTA B Gastrointestinal: abdomen soft, NT, ND, BS+ Musculoskeletal: no deformities, strength intact in all 4 Skin: moist, warm, no rashes Neurological: no tremor with outstretched hands, DTR normal in all 4  ASSESSMENT: 1. DM2, insulin-independent now, uncontrolled, with complications - CAD - PN  2. HL  3.  Obesity class I  PLAN:  1. Patient with longstanding, uncontrolled DM2, improved lately after changing from Victoza to [place]. Sugars were variable at last OV but close to goal. She was having an occasional high CBG in am after eating a late dinner or sweets at night. HbA1c was better, 7.2%. -At this visit, her sugars are better in the morning as she is trying to cut out sweets at night.  Indeed, they are mostly at goal now.  At night, she occasionally has higher blood sugars, in the 180s.  However, overall, sugars are out of close to goal, so for now, I would not recommend any changes in her treatment. - I suggested to:  Patient Instructions  Please  continue: - Glipizide ER 5 mg before breakfast and dinner - Ozempic 0.5 mg weekly.  Please return in 3-4 months with your sugar log.   - we checked her HbA1c: 6.9% (lower) - advised to check  sugars at different times of the day - 1x a day,  rotating check times - advised for yearly eye exams >> she is UTD - return to clinic in 3-4 months  2. HL -Reviewed latest lipid panel from 12/20: LDL at goal, triglycerides slightly high at goal Lab Results  Component Value Date   CHOL 148 07/29/2019   HDL 56 07/29/2019   LDLCALC 65 07/29/2019   TRIG 160 (H) 07/29/2019   CHOLHDL 2.6 07/29/2019  -Continues Lipitor 20 without side effects -She had labs by PCP several days ago.  We need to obtain records.  3.  Obesity class I -Continue GLP-1 receptor agonist should also help with weight loss -Weight was stable at last visit, now gained 2 pounds, probably over the holidays.  Philemon Kingdom, MD PhD [facility]

## 2020-08-23 ENCOUNTER — Other Ambulatory Visit (HOSPITAL_BASED_OUTPATIENT_CLINIC_OR_DEPARTMENT_OTHER): Payer: Self-pay | Admitting: Emergency Medicine

## 2020-08-23 ENCOUNTER — Encounter (HOSPITAL_BASED_OUTPATIENT_CLINIC_OR_DEPARTMENT_OTHER): Payer: Self-pay

## 2020-08-23 ENCOUNTER — Other Ambulatory Visit: Payer: Self-pay

## 2020-08-23 ENCOUNTER — Emergency Department (HOSPITAL_BASED_OUTPATIENT_CLINIC_OR_DEPARTMENT_OTHER)
Admission: EM | Admit: 2020-08-23 | Discharge: 2020-08-23 | Disposition: A | Discharge status: Home / Self Care | Payer: Medicare HMO | Attending: Emergency Medicine | Admitting: Emergency Medicine

## 2020-08-23 DIAGNOSIS — W57XXXA Bitten or stung by nonvenomous insect and other nonvenomous arthropods, initial encounter: Secondary | ICD-10-CM | POA: Insufficient documentation

## 2020-08-23 DIAGNOSIS — E1140 Type 2 diabetes mellitus with diabetic neuropathy, unspecified: Secondary | ICD-10-CM | POA: Diagnosis not present

## 2020-08-23 DIAGNOSIS — S40862A Insect bite (nonvenomous) of left upper arm, initial encounter: Secondary | ICD-10-CM | POA: Insufficient documentation

## 2020-08-23 DIAGNOSIS — R21 Rash and other nonspecific skin eruption: Secondary | ICD-10-CM | POA: Diagnosis present

## 2020-08-23 DIAGNOSIS — Z7984 Long term (current) use of oral hypoglycemic drugs: Secondary | ICD-10-CM | POA: Insufficient documentation

## 2020-08-23 DIAGNOSIS — Y92511 Restaurant or cafe as the place of occurrence of the external cause: Secondary | ICD-10-CM | POA: Diagnosis not present

## 2020-08-23 DIAGNOSIS — Z79899 Other long term (current) drug therapy: Secondary | ICD-10-CM | POA: Diagnosis not present

## 2020-08-23 DIAGNOSIS — S40861A Insect bite (nonvenomous) of right upper arm, initial encounter: Secondary | ICD-10-CM | POA: Diagnosis not present

## 2020-08-23 DIAGNOSIS — Z91040 Latex allergy status: Secondary | ICD-10-CM | POA: Diagnosis not present

## 2020-08-23 DIAGNOSIS — I2510 Atherosclerotic heart disease of native coronary artery without angina pectoris: Secondary | ICD-10-CM | POA: Diagnosis not present

## 2020-08-23 DIAGNOSIS — I10 Essential (primary) hypertension: Secondary | ICD-10-CM | POA: Insufficient documentation

## 2020-08-23 DIAGNOSIS — Z87891 Personal history of nicotine dependence: Secondary | ICD-10-CM | POA: Diagnosis not present

## 2020-08-23 DIAGNOSIS — S0096XA Insect bite (nonvenomous) of unspecified part of head, initial encounter: Secondary | ICD-10-CM | POA: Insufficient documentation

## 2020-08-23 DIAGNOSIS — Z7982 Long term (current) use of aspirin: Secondary | ICD-10-CM | POA: Diagnosis not present

## 2020-08-23 MED ORDER — HYDROXYZINE HCL 25 MG PO TABS: 25.0000 mg | ORAL_TABLET | Freq: Four times a day (QID) | ORAL | 0 refills | Status: DC | PRN

## 2020-08-23 MED ORDER — TRIAMCINOLONE ACETONIDE 0.1 % EX CREA: 1.0000 "application " | TOPICAL_CREAM | Freq: Two times a day (BID) | CUTANEOUS | 0 refills | Status: DC | PRN

## 2020-08-23 MED FILL — hydrOXYzine HCL 25 MG TABS: 25 | 5 days supply | Qty: 20 | Fill #0

## 2020-08-23 MED FILL — TRIAMCINOLONE 0.1% CREAM: 0.1 | 15 days supply | Qty: 30 | Fill #0

## 2020-08-23 NOTE — Discharge Instructions (Signed)
Please read and follow all provided instructions.  Your diagnoses today include:  1. Bedbug bite, initial encounter     Tests performed today include:  Vital signs. See below for your results today.   Medications prescribed:   Hydroxyzine - antihistamine  You can find this medication over-the-counter.   This medication will make you drowsy. DO NOT drive or perform any activities that require you to be awake and alert if taking this.  Take any prescribed medications only as directed.  Home care instructions:   Follow any educational materials contained in this packet  Follow-up instructions: Please follow-up with your primary care provider in the next 3 days for further evaluation of your symptoms.   Return instructions:   Please return to the Emergency Department if you experience worsening symptoms.   Call [phone] immediately if you have an allergic reaction that involves your lips, mouth, throat or if you have any difficulty breathing. This is a life-threatening emergency.   Please return if you have any other emergent concerns.  Additional Information:  Your vital signs today were: BP (!) 144/98 (BP Location: Left Arm)   Pulse 95   Temp 98.7 F (37.1 C) (Oral)   Resp 18   Ht 5\' 6"  (1.676 m)   Wt 80.7 kg   SpO2 96%   BMI 28.73 kg/m  If your blood pressure (BP) was elevated above 135/85 this visit, please have this repeated by your doctor within one month. --------------

## 2020-08-23 NOTE — ED Provider Notes (Signed)
[facility] EMERGENCY DEPARTMENT Provider Note   CSN: [id number] Arrival date & time: 08/23/20  1222     History Chief Complaint  Patient presents with  . Rash    Bonnie Gordon is a 65 y.o. female.  Patient with history of diabetes presents to the emergency department for evaluation of itchy rash.  Patient began having itchy bumps on her arms, legs, and face after staying at a hotel room in [place] on Thursday night.  Today is Monday.  No lip or tongue swelling, difficulty breathing or swallowing, vomiting, or diarrhea.  No fevers or other infection type symptoms.  Patient has been taking Benadryl with mixed results.  No new foods or medications reported.  2 of the patient's family members who were staying with her, also has a similar rash.        Past Medical History:  Diagnosis Date  . Anxiety   . Bipolar disorder ([place])   . Chest pain    a. 01/2003 MV w/ ? ant-inflat ischemia-->Cath: LAD 20p, otw nonobs dzs-->Med Rx; b. 2009 Cath: LAD 30, LCX nl/nondominant, RCA 30, nl EF; c. 08/2014 MV: no ischemia/infarct.  . Complication of anesthesia    pt. states she is difficult to wake up  . Depression   . Diabetes mellitus    Type II  . Diabetic neuropathy ([place])   . Diverticulitis   . GERD (gastroesophageal reflux disease)   . Headache   . History of bronchitis   . Hypercholesteremia   . Hypertension   . Hypokalemia   . PONV (postoperative nausea and vomiting)   . Shortness of breath dyspnea   . Sleep apnea   . Vertigo    patient reported    Patient Active Problem List   Diagnosis Date Noted  . Obesity, Class I, BMI 30-34.9 07/15/2018  . Nonintractable headache 03/27/2018  . Poorly controlled type 2 diabetes mellitus with circulatory disorder ([place]) 03/01/2016  . RBBB 03/02/2015  . Obstructive apnea 03/01/2015  . CAD (coronary artery disease) 07/08/2014  . Essential hypertension 07/08/2014  . Hyperlipidemia LDL goal <70 07/08/2014  . OSA on CPAP  07/08/2014  . Chest pain 07/08/2014  . Constipation 07/22/2013  . Yeast vaginitis 07/22/2013    Past Surgical History:  Procedure Laterality Date  . ABDOMINAL HYSTERECTOMY    . APPENDECTOMY    . CARDIAC CATHETERIZATION  2009   normal cath  . CARDIAC CATHETERIZATION  2004   normal cath  . CHOLECYSTECTOMY    . TONSILLECTOMY    . TONSILLECTOMY/ADENOIDECTOMY/TURBINATE REDUCTION Bilateral 03/01/2015   Procedure: TONSILLECTOMY/TURBINATE REDUCTION;  Surgeon: Rozetta Nunnery, MD;  Location: [facility];  Service: ENT;  Laterality: Bilateral;  . UVULOPALATOPHARYNGOPLASTY Bilateral 03/01/2015   Procedure: UVULOPALATOPHARYNGOPLASTY (UPPP);  Surgeon: Rozetta Nunnery, MD;  Location: [facility];  Service: ENT;  Laterality: Bilateral;  . WISDOM TOOTH EXTRACTION       OB History   No obstetric history on file.     Family History  Problem Relation Age of Onset  . Diabetes Mother   . Gout Mother   . Hypertension Mother   . Arthritis Mother   . Heart disease Father   . Heart failure Sister   . Arthritis Sister   . Heart disease Maternal Grandmother   . Diabetes Maternal Grandmother   . Heart disease Paternal Grandmother   . Diabetes Paternal Grandmother   . Diabetes Sister   . Bursitis Sister   . Crohn's disease Sister  Social History   Tobacco Use  . Smoking status: Former Smoker    Quit date: 08/07/1982    Years since quitting: 38.0  . Smokeless tobacco: Never Used  Vaping Use  . Vaping Use: Never used  Substance Use Topics  . Alcohol use: Yes    Comment: occasional / social  . Drug use: No    Home Medications Prior to Admission medications   Medication Sig Start Date End Date Taking? Authorizing Provider  hydrOXYzine (ATARAX/VISTARIL) 25 MG tablet Take 1 tablet (25 mg total) by mouth every 6 (six) hours as needed for itching. 08/23/20  Yes Carlisle Cater, PA-C  triamcinolone (KENALOG) 0.1 % Apply 1 application topically 2 (two) times daily as needed. Use directly to  itchy areas, avoid face. 08/23/20  Yes Carlisle Cater, PA-C  ACCU-CHEK SMARTVIEW test strip USE AS INSTRUCTED TO CHECK SUGAR 2 TIMES DAILY 07/05/20   Philemon Kingdom, MD  amLODipine (NORVASC) 5 MG tablet TAKE 1 TABLET (5 MG TOTAL) BY MOUTH DAILY. 05/20/20   Ledora Bottcher, PA  aspirin EC 81 MG tablet Take 81 mg by mouth daily.    [provider]  atorvastatin (LIPITOR) 20 MG tablet Take 20 mg by mouth every evening.  12/30/18   [provider]  b complex vitamins capsule Take 1 capsule by mouth daily.    [provider]  Blood Glucose Monitoring Suppl (ACCU-CHEK NANO SMARTVIEW) w/Device KIT USE TO CHECK SUGAR DAILY. 04/08/20   Philemon Kingdom, MD  celecoxib (CELEBREX) 200 MG capsule Take 200 mg by mouth daily as needed.     [provider]  diclofenac sodium (VOLTAREN) 1 % GEL Apply 2 g topically 4 (four) times daily as needed for pain. 12/08/18   [provider]  glipiZIDE (GLUCOTROL XL) 5 MG 24 hr tablet Take 1 tablet (5 mg total) by mouth 2 (two) times daily. 07/29/19   Philemon Kingdom, MD  metoprolol succinate (TOPROL XL) 25 MG 24 hr tablet Take 1 tablet (25 mg total) by mouth daily. 07/02/19   Troy Sine, MD  Multiple Vitamin (MULTIVITAMIN) capsule Take 1 capsule by mouth daily.    [provider]  omeprazole (PRILOSEC) 40 MG capsule Take 40 mg by mouth daily. 08/04/20   [provider]  Semaglutide,0.25 or 0.5MG/DOS, (OZEMPIC, 0.25 OR 0.5 MG/DOSE,) 2 MG/1.5ML SOPN Inject 0.5 mg into the skin once a week. 11/25/19   Philemon Kingdom, MD  valACYclovir (VALTREX) 500 MG tablet Take 500 mg by mouth See admin instructions. Takes Mon,Tues,Wed bid 05/24/16   [provider]    Allergies    Other and Latex  Review of Systems   Review of Systems  Constitutional: Negative for fever.  HENT: Negative for facial swelling and trouble swallowing.   Eyes: Negative for redness.  Respiratory: Negative for shortness of  breath, wheezing and stridor.   Cardiovascular: Negative for chest pain.  Gastrointestinal: Negative for nausea and vomiting.  Musculoskeletal: Negative for myalgias.  Skin: Positive for rash.  Neurological: Negative for light-headedness.  Psychiatric/Behavioral: Negative for confusion.    Physical Exam Updated Vital Signs BP (!) 144/98 (BP Location: Left Arm)   Pulse 95   Temp 98.7 F (37.1 C) (Oral)   Resp 18   Ht 5' 6"  (1.676 m)   Wt 80.7 kg   SpO2 96%   BMI 28.73 kg/m   Physical Exam Vitals and nursing note reviewed.  Constitutional:      Appearance: She is well-developed.  HENT:  Head: Normocephalic and atraumatic.     Mouth/Throat:     Comments: No signs of angioedema Eyes:     Conjunctiva/sclera: Conjunctivae normal.  Pulmonary:     Effort: No respiratory distress.  Musculoskeletal:     Cervical back: Normal range of motion and neck supple.  Skin:    General: Skin is warm and dry.     Comments: Patient with scattered wheals noted on the upper extremities and right forehead.  Neurological:     Mental Status: She is alert.     ED Results / Procedures / Treatments   Labs (all labs ordered are listed, but only abnormal results are displayed) Labs Reviewed - No data to display  EKG None  Radiology No results found.  Procedures Procedures (including critical care time)  Medications Ordered in ED Medications - No data to display  ED Course  I have reviewed the triage vital signs and the nursing notes.  Pertinent labs & imaging results that were available during my care of the patient were reviewed by me and considered in my medical decision making (see chart for details).  Patient seen and examined.  Patient with itchy rash, most likely due to bedbug bite given history.  Will give hydroxyzine and replacement of Benadryl.  Will give topical Kenalog steroid to use as a spot treatment for most itchy areas.  Will avoid oral steroids given history of  diabetes.  If symptoms are still present greater than 1 week after onset, would consider permethrin, however history most consistent with bedbugs.  Vital signs reviewed and are as follows: BP (!) 144/98 (BP Location: Left Arm)   Pulse 95   Temp 98.7 F (37.1 C) (Oral)   Resp 18   Ht 5' 6"  (1.676 m)   Wt 80.7 kg   SpO2 96%   BMI 28.73 kg/m       MDM Rules/Calculators/A&P                          Patient with skin reaction due to bedbug bites, no signs of anaphylaxis or other allergic reaction.   Final Clinical Impression(s) / ED Diagnoses Final diagnoses:  Bedbug bite, initial encounter    Rx / DC Orders ED Discharge Orders         Ordered    triamcinolone (KENALOG) 0.1 %  2 times daily PRN        08/23/20 1524    hydrOXYzine (ATARAX/VISTARIL) 25 MG tablet  Every 6 hours PRN        08/23/20 1524           Carlisle Cater, PA-C 08/23/20 1529    Truddie Hidden, MD 08/23/20 1726

## 2020-10-08 ENCOUNTER — Other Ambulatory Visit: Payer: Self-pay | Admitting: Internal Medicine

## 2020-11-12 ENCOUNTER — Telehealth: Payer: Self-pay | Admitting: Internal Medicine

## 2020-11-12 NOTE — Telephone Encounter (Deleted)
error 

## 2020-12-07 ENCOUNTER — Other Ambulatory Visit: Payer: Self-pay

## 2020-12-07 ENCOUNTER — Encounter: Payer: Self-pay | Admitting: Internal Medicine

## 2020-12-07 ENCOUNTER — Ambulatory Visit (INDEPENDENT_AMBULATORY_CARE_PROVIDER_SITE_OTHER): Payer: Medicare HMO | Admitting: Internal Medicine

## 2020-12-07 VITALS — BP 128/88 | HR 86 | Ht 66.0 in | Wt 186.0 lb

## 2020-12-07 DIAGNOSIS — E1159 Type 2 diabetes mellitus with other circulatory complications: Secondary | ICD-10-CM

## 2020-12-07 DIAGNOSIS — E1165 Type 2 diabetes mellitus with hyperglycemia: Secondary | ICD-10-CM

## 2020-12-07 DIAGNOSIS — E785 Hyperlipidemia, unspecified: Secondary | ICD-10-CM | POA: Diagnosis not present

## 2020-12-07 DIAGNOSIS — E669 Obesity, unspecified: Secondary | ICD-10-CM

## 2020-12-07 LAB — POCT GLYCOSYLATED HEMOGLOBIN (HGB A1C): Hemoglobin A1C: 6.8 % — AB (ref 4.0–5.6)

## 2020-12-07 MED ORDER — OZEMPIC (1 MG/DOSE) 4 MG/3ML ~~LOC~~ SOPN: 1.0000 mg | PEN_INJECTOR | SUBCUTANEOUS | 3 refills | Status: DC

## 2020-12-07 MED ORDER — GLIPIZIDE ER 5 MG PO TB24: 5.0000 mg | ORAL_TABLET | Freq: Every day | ORAL | 3 refills | Status: DC

## 2020-12-07 NOTE — Addendum Note (Signed)
Addended by: Kenyon Ana on: 12/07/2020 08:35 AM   Modules accepted: Orders

## 2020-12-07 NOTE — Progress Notes (Signed)
Patient ID: Bonnie Gordon, female   DOB: 24-Nov-1955, 65 y.o.   MRN: [id number]  This visit occurred during the SARS-CoV-2 public health emergency.  Safety protocols were in place, including screening questions prior to the visit, additional usage of staff PPE, and extensive cleaning of exam room while observing appropriate contact time as indicated for disinfecting solutions.   HPI: Bonnie Gordon is a 65 y.o.-year-old female, presenting for follow-up for DM2, dx in 2012, insulin-independent, uncontrolled, with complications (CAD, PN). Last visit  4 months ago.  Interim history: No increased urination, blurry vision, nausea. She did have some weight gain since last visit.  Reviewed HbA1c levels: Lab Results  Component Value Date   HGBA1C 6.9 (A) 08/09/2020   HGBA1C 7.2 (A) 04/06/2020   HGBA1C 8.3 (A) 11/25/2019   HGBA1C 7.1 (A) 07/29/2019   HGBA1C 7.7 (A) 04/01/2019   HGBA1C 7.5 (A) 07/15/2018   HGBA1C 8.1 08/24/2017   HGBA1C 6.8 04/25/2017   HGBA1C 6.5 01/18/2017   HGBA1C 6.2 10/18/2016   HGBA1C 7.5 07/20/2016   HGBA1C 9.5 04/20/2016   HGBA1C 8.1 (H) 08/30/2015   HGBA1C 7.1 (H) 02/25/2015   HGBA1C 7.0 (H) 08/26/2014   HGBA1C (H) 09/16/2009    6.5 (NOTE) The ADA recommends the following therapeutic goal for glycemic control related to Hgb A1c measurement: Goal of therapy: <6.5 Hgb A1c  Reference: American Diabetes Association: Clinical Practice Recommendations 2010, Diabetes Care, 2010, 33: (Suppl  1).  11/2018: HbA1c 7.2% 01/14/2016: HbA1c 11.3%  Patient is on: - Glipizide ER 5 mg before breakfast and dinner - Victoza 1.8 mg at night >> moved to a.m. >> Ozempic 0.5 mg weekly She was previously on: - Levemir up to 60 units at bedtime-stopped 11/2018 - Glipizide 5 mg before dinner >> stopped  - Glimepiride 4 mg before b'fast/ Glipizide XL 10 mg in am  - Tradjenta 5 mg in am  These were stopped when she started insulin. She was on Metformin >> diarrhea.  Pt checks  her sugars twice a day: - am: 94, 100, 124-134, 179 >> 94-165 >> 120-132 >> 94-139 - 2h after b'fast: 127 >> n/c >> 126 >> n/c >> 141 - before lunch: 100, 120, 153 >> n/c >> 64 >> n/c - 2h after lunch: n/c >> 167 (cake) >> n/c - before dinner: 68, 113 >> 80-120 >> n/c >> 130 >> n/c - 2h after dinner: n/c >> 196 x1 >> n/c - bedtime: 118-134 >> 140-185 >> 100-180s >> 76-140 - nighttime: n/c Lowest sugar was 64 >> ...  92 >> 94 >> 85 >> 76; it is unclear at which level she has hypoglycemia awareness. Highest sugar was 409 (?) >> 185 >> 180s >> 141.  Glucometer: AccuChek [employer]iva  Meals: fish  - fried - once a month when she eats out; no red meat.  Salads with ranch dressing.  -No CKD, last BUN/creatinine: Lab Results  Component Value Date   BUN 8 08/05/2020   BUN 11 01/05/2020   CREATININE 0.85 08/05/2020   CREATININE 0.67 01/05/2020  01/13/2016: 12/0.83, glucose 472  -+ HL; last set of lipids: Lab Results  Component Value Date   CHOL 148 07/29/2019   HDL 56 07/29/2019   LDLCALC 65 07/29/2019   TRIG 160 (H) 07/29/2019   CHOLHDL 2.6 07/29/2019  01/13/2016: 147/218/73/69 On Lipitor 20.  Previously on Crestor. - last eye exam was in 11/2020: Reportedly no DR, + incipient stable cataract. Sees [facility]. -+ Numbness and tingling in her feet.  She  sees podiatry.  Previously on Neurontin. I suggested alpha-lipoic acid and B complex in the past.  She was admitted with CP 05/2016 (found to have hypokalemia, mm cramp). She has mild nonobstructive CAD.  She will be a trial for study for nausea in pts with DM. Before last visit she started omeprazole -this is helping her GERD.  ROS: Constitutional: + weight gain/no weight loss, no fatigue, no subjective hyperthermia, no subjective hypothermia Eyes: no blurry vision, no xerophthalmia ENT: no sore throat, no nodules palpated in neck, no dysphagia, no odynophagia, no hoarseness Cardiovascular: no CP/+ SOB/no palpitations/no leg  swelling Respiratory: no cough/+ SOB/no wheezing Gastrointestinal: no N/no V/no D/no C/no acid reflux Musculoskeletal: no muscle aches/no joint aches Skin: no rashes, no hair loss Neurological: no tremors/+ numbness/+ tingling/no dizziness  I reviewed pt's medications, allergies, PMH, social hx, family hx, and changes were documented in the history of present illness. Otherwise, unchanged from my initial visit note.  Past Medical History:  Diagnosis Date  . Anxiety   . Bipolar disorder ([place])   . Chest pain    a. 01/2003 MV w/ ? ant-inflat ischemia-->Cath: LAD 20p, otw nonobs dzs-->Med Rx; b. 2009 Cath: LAD 30, LCX nl/nondominant, RCA 30, nl EF; c. 08/2014 MV: no ischemia/infarct.  . Complication of anesthesia    pt. states she is difficult to wake up  . Depression   . Diabetes mellitus    Type II  . Diabetic neuropathy ([place])   . Diverticulitis   . GERD (gastroesophageal reflux disease)   . Headache   . History of bronchitis   . Hypercholesteremia   . Hypertension   . Hypokalemia   . PONV (postoperative nausea and vomiting)   . Shortness of breath dyspnea   . Sleep apnea   . Vertigo    patient reported   Past Surgical History:  Procedure Laterality Date  . ABDOMINAL HYSTERECTOMY    . APPENDECTOMY    . CARDIAC CATHETERIZATION  2009   normal cath  . CARDIAC CATHETERIZATION  2004   normal cath  . CHOLECYSTECTOMY    . TONSILLECTOMY    . TONSILLECTOMY/ADENOIDECTOMY/TURBINATE REDUCTION Bilateral 03/01/2015   Procedure: TONSILLECTOMY/TURBINATE REDUCTION;  Surgeon: Rozetta Nunnery, MD;  Location: [facility];  Service: ENT;  Laterality: Bilateral;  . UVULOPALATOPHARYNGOPLASTY Bilateral 03/01/2015   Procedure: UVULOPALATOPHARYNGOPLASTY (UPPP);  Surgeon: Rozetta Nunnery, MD;  Location: [facility];  Service: ENT;  Laterality: Bilateral;  . WISDOM TOOTH EXTRACTION     Social History   Social History  . Marital status: Divorced    Spouse name: N/A  . Number of children: 2    Occupational History  . Disability    Social History Main Topics  . Smoking status: Former Smoker    Quit date: 08/07/1982  . Smokeless tobacco: Never Used  . Alcohol use Yes     Comment: Drinks during the weekends 1 beer or 1 drink of liquor per day   . Drug use: No  . Sexual activity: Yes    Partners: Male    Birth control/ protection: Surgical   Current Outpatient Medications on File Prior to Visit  Medication Sig Dispense Refill  . ACCU-CHEK SMARTVIEW test strip USE AS INSTRUCTED TO CHECK SUGAR 2 TIMES DAILY 200 strip 5  . amLODipine (NORVASC) 5 MG tablet TAKE 1 TABLET (5 MG TOTAL) BY MOUTH DAILY. 90 tablet 3  . aspirin EC 81 MG tablet Take 81 mg by mouth daily.    Marland Kitchen atorvastatin (LIPITOR) 20 MG tablet  Take 20 mg by mouth every evening.     Marland Kitchen b complex vitamins capsule Take 1 capsule by mouth daily.    . Blood Glucose Monitoring Suppl (ACCU-CHEK NANO SMARTVIEW) w/Device KIT USE TO CHECK SUGAR DAILY. 1 kit 0  . celecoxib (CELEBREX) 200 MG capsule Take 200 mg by mouth daily as needed.     . diclofenac sodium (VOLTAREN) 1 % GEL Apply 2 g topically 4 (four) times daily as needed for pain.    Marland Kitchen glipiZIDE (GLUCOTROL XL) 5 MG 24 hr tablet TAKE 1 TABLET BY MOUTH TWICE A DAY 180 tablet 3  . hydrOXYzine (ATARAX/VISTARIL) 25 MG tablet TAKE 1 TABLET (25 MG TOTAL) BY MOUTH EVERY 6 (SIX) HOURS AS NEEDED FOR ITCHING. 20 tablet 0  . metoprolol succinate (TOPROL XL) 25 MG 24 hr tablet Take 1 tablet (25 mg total) by mouth daily. 90 tablet 3  . Multiple Vitamin (MULTIVITAMIN) capsule Take 1 capsule by mouth daily.    Marland Kitchen omeprazole (PRILOSEC) 40 MG capsule Take 40 mg by mouth daily.    . Semaglutide,0.25 or 0.5MG/DOS, (OZEMPIC, 0.25 OR 0.5 MG/DOSE,) 2 MG/1.5ML SOPN Inject 0.5 mg into the skin once a week. 3 pen 5  . triamcinolone (KENALOG) 0.1 % APPLY 1 APPLICATION TOPICALLY 2 (TWO) TIMES DAILY AS NEEDED. USE DIRECTLY TO ITCHY AREAS, AVOID FACE. 30 g 0  . valACYclovir (VALTREX) 500 MG tablet Take 500  mg by mouth See admin instructions. Takes Mon,Tues,Wed bid     No current facility-administered medications on file prior to visit.   Allergies  Allergen Reactions  . Other Other (See Comments)    No Blood Products, personal preference  . Latex Itching and Rash   Family History  Problem Relation Age of Onset  . Diabetes Mother   . Gout Mother   . Hypertension Mother   . Arthritis Mother   . Heart disease Father   . Heart failure Sister   . Arthritis Sister   . Heart disease Maternal Grandmother   . Diabetes Maternal Grandmother   . Heart disease Paternal Grandmother   . Diabetes Paternal Grandmother   . Diabetes Sister   . Bursitis Sister   . Crohn's disease Sister    PE: BP 128/88 (BP Location: Right Arm, Patient Position: Sitting, Cuff Size: Normal)   Pulse 86   Ht _0  (1.676 m)   Wt 186 lb (84.4 kg)   SpO2 97%   BMI 30.02 kg/m  Wt Readings from Last 3 Encounters:  12/07/20 186 lb (84.4 kg)  08/23/20 178 lb (80.7 kg)  08/09/20 187 lb 3.2 oz (84.9 kg)   Constitutional: overweight, in NAD Eyes: PERRLA, EOMI, no exophthalmos ENT: moist mucous membranes, no thyromegaly, no cervical lymphadenopathy Cardiovascular: RRR, No MRG Respiratory: CTA B Gastrointestinal: abdomen soft, NT, ND, BS+ Musculoskeletal: no deformities, strength intact in all 4 Skin: moist, warm, no rashes Neurological: no tremor with outstretched hands, DTR normal in all 4  ASSESSMENT: 1. DM2, insulin-independent now, uncontrolled, with complications - CAD - PN  2. HL  3.  Obesity class I  PLAN:  1. Patient with longstanding, uncontrolled, type 2 diabetes, improved after changing GLP-1 receptor agonist formulations from Victoza to [place].  At last visit, sugars were at or very close to goal, and an HbA1c was 6.9%, improved.  We did not change her regimen at that time.  We did discuss about improving diet.  She was trying to cut out sweets at night. - at this visit, most  of her blood  sugars are at goal.  No lows or significant high blood sugars. - I suggested to increase the dose of Ozempic, mostly to help with her weight, especially as she tolerates the lower dose well.  In the meantime, we will stop her glipizide before dinner.  Explained the benefits of increasing Ozempic. - I suggested to:  Patient Instructions  Please  decrease: - Glipizide ER 5 mg before breakfast  Increase: - Ozempic 1 mg weekly  Please return in 3-4 months with your sugar log.   - we checked her HbA1c: 6.8% (better) - advised to check sugars at different times of the day - 1x a day, rotating check times - advised for yearly eye exams >> she is UTD - return to clinic in 3-4 months  2. HL -Reviewed her lipid panel results from 07/2019: Triglycerides slightly high, but the rest of the fractions were at goal: Lab Results  Component Value Date   CHOL 148 07/29/2019   HDL 56 07/29/2019   LDLCALC 65 07/29/2019   TRIG 160 (H) 07/29/2019   CHOLHDL 2.6 07/29/2019  -Continues Lipitor 20 mg daily without side effects -She had labs by PCP before last visit.  I do not have these records. She will obtain and bring them at next OV.  3.  Obesity class I -We will increase the GLP-1 receptor agonist which should also help with weight loss -she gained 8 lbs since last OV  Philemon Kingdom, MD PhD [facility]

## 2020-12-07 NOTE — Patient Instructions (Signed)
Please  decrease: - Glipizide ER 5 mg before breakfast  Increase: - Ozempic 1 mg weekly  Please return in 3-4 months with your sugar log.

## 2021-01-24 ENCOUNTER — Telehealth: Payer: Self-pay | Admitting: Internal Medicine

## 2021-01-24 NOTE — Telephone Encounter (Signed)
Pt called regarding her recent high Blood Sugars.   She states her blood sugars have never been over 130 and since getting her steroid shots her BS have increased. She states she got 2 shots the end of last month and 2 the beginning of this month and she knows at least 2 of the shots were steroid shots but not sure what the others were. She states after receiving her 1st shot her BS rose to 300 and since then her BS have been going to the 200s and the lowest has been 174. She states she is not eating anything that could effect these readings and when she goes to bed at night she will take her BS and it will usually read 174 and will not eat anything and go to bed but will wake up with her sugars spiked.   Pt would like a call to see what she can do about this at [phone].

## 2021-01-25 NOTE — Telephone Encounter (Signed)
Please advise 

## 2021-01-25 NOTE — Telephone Encounter (Signed)
Pt calling to follow up on last message. Pt states that her blood sugar is high in the morning and low at night

## 2021-01-26 NOTE — Telephone Encounter (Signed)
Patient called again re: Patient has not received a call back from office re: Previous messages first sent on 01/24/21. Patient requests to be called asap at ph# [phone]. Patient states since taking steroid shots her blood sugars have been fluctuating.

## 2021-04-04 ENCOUNTER — Other Ambulatory Visit: Payer: Self-pay

## 2021-04-04 ENCOUNTER — Encounter (HOSPITAL_COMMUNITY): Payer: Self-pay

## 2021-04-04 ENCOUNTER — Emergency Department (HOSPITAL_COMMUNITY)
Admission: EM | Admit: 2021-04-04 | Discharge: 2021-04-04 | Disposition: A | Discharge status: Home / Self Care | Payer: Medicare HMO | Attending: Emergency Medicine | Admitting: Emergency Medicine

## 2021-04-04 ENCOUNTER — Emergency Department (HOSPITAL_COMMUNITY): Payer: Medicare HMO

## 2021-04-04 ENCOUNTER — Telehealth: Payer: Self-pay | Admitting: Cardiovascular Disease

## 2021-04-04 DIAGNOSIS — Z79899 Other long term (current) drug therapy: Secondary | ICD-10-CM | POA: Diagnosis not present

## 2021-04-04 DIAGNOSIS — E1140 Type 2 diabetes mellitus with diabetic neuropathy, unspecified: Secondary | ICD-10-CM | POA: Insufficient documentation

## 2021-04-04 DIAGNOSIS — N202 Calculus of kidney with calculus of ureter: Secondary | ICD-10-CM | POA: Diagnosis not present

## 2021-04-04 DIAGNOSIS — Z7982 Long term (current) use of aspirin: Secondary | ICD-10-CM | POA: Diagnosis not present

## 2021-04-04 DIAGNOSIS — Z91040 Latex allergy status: Secondary | ICD-10-CM | POA: Diagnosis not present

## 2021-04-04 DIAGNOSIS — I2510 Atherosclerotic heart disease of native coronary artery without angina pectoris: Secondary | ICD-10-CM | POA: Insufficient documentation

## 2021-04-04 DIAGNOSIS — N201 Calculus of ureter: Secondary | ICD-10-CM

## 2021-04-04 DIAGNOSIS — K5732 Diverticulitis of large intestine without perforation or abscess without bleeding: Secondary | ICD-10-CM | POA: Insufficient documentation

## 2021-04-04 DIAGNOSIS — Z87891 Personal history of nicotine dependence: Secondary | ICD-10-CM | POA: Diagnosis not present

## 2021-04-04 DIAGNOSIS — K5792 Diverticulitis of intestine, part unspecified, without perforation or abscess without bleeding: Secondary | ICD-10-CM

## 2021-04-04 DIAGNOSIS — R079 Chest pain, unspecified: Secondary | ICD-10-CM | POA: Insufficient documentation

## 2021-04-04 DIAGNOSIS — Z7984 Long term (current) use of oral hypoglycemic drugs: Secondary | ICD-10-CM | POA: Diagnosis not present

## 2021-04-04 DIAGNOSIS — R109 Unspecified abdominal pain: Secondary | ICD-10-CM | POA: Diagnosis present

## 2021-04-04 DIAGNOSIS — I10 Essential (primary) hypertension: Secondary | ICD-10-CM | POA: Diagnosis not present

## 2021-04-04 LAB — CBC WITH DIFFERENTIAL/PLATELET
Abs Immature Granulocytes: 0.04 10*3/uL (ref 0.00–0.07)
Basophils Absolute: 0 10*3/uL (ref 0.0–0.1)
Basophils Relative: 0 %
Eosinophils Absolute: 0.1 10*3/uL (ref 0.0–0.5)
Eosinophils Relative: 1 %
HCT: 46.5 % — ABNORMAL HIGH (ref 36.0–46.0)
Hemoglobin: 15.3 g/dL — ABNORMAL HIGH (ref 12.0–15.0)
Immature Granulocytes: 1 %
Lymphocytes Relative: 23 %
Lymphs Abs: 1.6 10*3/uL (ref 0.7–4.0)
MCH: 28.4 pg (ref 26.0–34.0)
MCHC: 32.9 g/dL (ref 30.0–36.0)
MCV: 86.4 fL (ref 80.0–100.0)
Monocytes Absolute: 0.4 10*3/uL (ref 0.1–1.0)
Monocytes Relative: 6 %
Neutro Abs: 4.7 10*3/uL (ref 1.7–7.7)
Neutrophils Relative %: 69 %
Platelets: 198 10*3/uL (ref 150–400)
RBC: 5.38 MIL/uL — ABNORMAL HIGH (ref 3.87–5.11)
RDW: 12.8 % (ref 11.5–15.5)
WBC: 6.8 10*3/uL (ref 4.0–10.5)
nRBC: 0 % (ref 0.0–0.2)

## 2021-04-04 LAB — COMPREHENSIVE METABOLIC PANEL
ALT: 37 U/L (ref 0–44)
AST: 28 U/L (ref 15–41)
Albumin: 4.2 g/dL (ref 3.5–5.0)
Alkaline Phosphatase: 126 U/L (ref 38–126)
Anion gap: 9 (ref 5–15)
BUN: 12 mg/dL (ref 8–23)
CO2: 27 mmol/L (ref 22–32)
Calcium: 9.9 mg/dL (ref 8.9–10.3)
Chloride: 105 mmol/L (ref 98–111)
Creatinine, Ser: 1.08 mg/dL — ABNORMAL HIGH (ref 0.44–1.00)
GFR, Estimated: 57 mL/min — ABNORMAL LOW (ref >60)
Glucose, Bld: 200 mg/dL — ABNORMAL HIGH (ref 70–99)
Potassium: 4.9 mmol/L (ref 3.5–5.1)
Sodium: 141 mmol/L (ref 135–145)
Total Bilirubin: 0.6 mg/dL (ref 0.3–1.2)
Total Protein: 8.1 g/dL (ref 6.5–8.1)

## 2021-04-04 LAB — URINALYSIS, ROUTINE W REFLEX MICROSCOPIC
Bilirubin Urine: NEGATIVE
Glucose, UA: 50 mg/dL — AB
Ketones, ur: NEGATIVE mg/dL
Leukocytes,Ua: NEGATIVE
Nitrite: NEGATIVE
Protein, ur: NEGATIVE mg/dL
Specific Gravity, Urine: 1.009 (ref 1.005–1.030)
pH: 7 (ref 5.0–8.0)

## 2021-04-04 LAB — TROPONIN I (HIGH SENSITIVITY): Troponin I (High Sensitivity): <2 ng/L (ref <18)

## 2021-04-04 MED ORDER — HYDROMORPHONE HCL 1 MG/ML IJ SOLN
0.5000 mg | Freq: Once | INTRAMUSCULAR | Status: AC
  Administered 2021-04-04: 0.5 mg via INTRAVENOUS
  Filled 2021-04-04: qty 1

## 2021-04-04 MED ORDER — CIPROFLOXACIN HCL 500 MG PO TABS: 500.0000 mg | ORAL_TABLET | Freq: Two times a day (BID) | ORAL | 0 refills | Status: AC

## 2021-04-04 MED ORDER — ONDANSETRON HCL 4 MG/2ML IJ SOLN
4.0000 mg | Freq: Once | INTRAMUSCULAR | Status: AC
Start: 2021-04-04 — End: 2021-04-04
  Administered 2021-04-04: 4 mg via INTRAVENOUS
  Filled 2021-04-04: qty 2

## 2021-04-04 MED ORDER — METRONIDAZOLE 500 MG PO TABS: 500.0000 mg | ORAL_TABLET | Freq: Three times a day (TID) | ORAL | 0 refills | Status: AC

## 2021-04-04 MED ORDER — SODIUM CHLORIDE 0.9 % IV BOLUS
500.0000 mL | Freq: Once | INTRAVENOUS | Status: AC
  Administered 2021-04-04: 500 mL via INTRAVENOUS

## 2021-04-04 MED ORDER — TAMSULOSIN HCL 0.4 MG PO CAPS: 0.4000 mg | ORAL_CAPSULE | Freq: Every day | ORAL | 0 refills | Status: AC

## 2021-04-04 MED ORDER — HYDROCODONE-ACETAMINOPHEN 5-325 MG PO TABS: 1.0000 | ORAL_TABLET | ORAL | 0 refills | Status: AC | PRN

## 2021-04-04 NOTE — ED Provider Notes (Signed)
[facility] DEPT Provider Note   CSN: [id number] Arrival date & time: 04/04/21  0130     History Chief Complaint  Patient presents with   Emesis   Flank Pain    Bonnie Gordon is a 65 y.o. female.  Patient to ED with sudden onset of left flank extending to left abdominal pain with nausea and vomiting tonight around 1:00 am. No history of kidney stones. No fever. She states she has had some difficulty urinating but denies hematuria the last time she passed urine. No chest pain, SOB.   The history is provided by the patient. No language interpreter was used.  Emesis Associated symptoms: abdominal pain   Associated symptoms: no chills and no fever   Flank Pain Associated symptoms include abdominal pain. Pertinent negatives include no chest pain and no shortness of breath.      Past Medical History:  Diagnosis Date   Anxiety    Bipolar disorder ([place])    Chest pain    a. 01/2003 MV w/ ? ant-inflat ischemia-->Cath: LAD 20p, otw nonobs dzs-->Med Rx; b. 2009 Cath: LAD 30, LCX nl/nondominant, RCA 30, nl EF; c. 08/2014 MV: no ischemia/infarct.   Complication of anesthesia    pt. states she is difficult to wake up   Depression    Diabetes mellitus    Type II   Diabetic neuropathy (HCC)    Diverticulitis    GERD (gastroesophageal reflux disease)    Headache    History of bronchitis    Hypercholesteremia    Hypertension    Hypokalemia    PONV (postoperative nausea and vomiting)    Shortness of breath dyspnea    Sleep apnea    Vertigo    patient reported    Patient Active Problem List   Diagnosis Date Noted   Obesity, Class I, BMI 30-34.9 07/15/2018   Nonintractable headache 03/27/2018   Poorly controlled type 2 diabetes mellitus with circulatory disorder ([place]) 03/01/2016   RBBB 03/02/2015   Obstructive apnea 03/01/2015   CAD (coronary artery disease) 07/08/2014   Essential hypertension 07/08/2014   Hyperlipidemia LDL goal <70 07/08/2014    OSA on CPAP 07/08/2014   Chest pain 07/08/2014   Constipation 07/22/2013   Yeast vaginitis 07/22/2013    Past Surgical History:  Procedure Laterality Date   ABDOMINAL HYSTERECTOMY     APPENDECTOMY     CARDIAC CATHETERIZATION  2009   normal cath   CARDIAC CATHETERIZATION  2004   normal cath   CHOLECYSTECTOMY     TONSILLECTOMY     TONSILLECTOMY/ADENOIDECTOMY/TURBINATE REDUCTION Bilateral 03/01/2015   Procedure: TONSILLECTOMY/TURBINATE REDUCTION;  Surgeon: Rozetta Nunnery, MD;  Location: [facility];  Service: ENT;  Laterality: Bilateral;   UVULOPALATOPHARYNGOPLASTY Bilateral 03/01/2015   Procedure: UVULOPALATOPHARYNGOPLASTY (UPPP);  Surgeon: Rozetta Nunnery, MD;  Location: [facility];  Service: ENT;  Laterality: Bilateral;   WISDOM TOOTH EXTRACTION       OB History   No obstetric history on file.     Family History  Problem Relation Age of Onset   Diabetes Mother    Gout Mother    Hypertension Mother    Arthritis Mother    Heart disease Father    Heart failure Sister    Arthritis Sister    Heart disease Maternal Grandmother    Diabetes Maternal Grandmother    Heart disease Paternal Grandmother    Diabetes Paternal Grandmother    Diabetes Sister    Bursitis Sister    Crohn's disease Sister  Social History   Tobacco Use   Smoking status: Former    Types: Cigarettes    Quit date: 08/07/1982    Years since quitting: 38.6   Smokeless tobacco: Never  Vaping Use   Vaping Use: Never used  Substance Use Topics   Alcohol use: Yes    Comment: occasional / social   Drug use: No    Home Medications Prior to Admission medications   Medication Sig Start Date End Date Taking? Authorizing Provider  ACCU-CHEK SMARTVIEW test strip USE AS INSTRUCTED TO CHECK SUGAR 2 TIMES DAILY 07/05/20   Philemon Kingdom, MD  amLODipine (NORVASC) 5 MG tablet TAKE 1 TABLET (5 MG TOTAL) BY MOUTH DAILY. 05/20/20   Ledora Bottcher, PA  aspirin EC 81 MG tablet Take 81 mg by mouth daily.     [provider]  atorvastatin (LIPITOR) 20 MG tablet Take 20 mg by mouth every evening.  12/30/18   [provider]  b complex vitamins capsule Take 1 capsule by mouth daily.    [provider]  Blood Glucose Monitoring Suppl (ACCU-CHEK NANO SMARTVIEW) w/Device KIT USE TO CHECK SUGAR DAILY. 04/08/20   Philemon Kingdom, MD  celecoxib (CELEBREX) 200 MG capsule Take 200 mg by mouth daily as needed.     [provider]  diclofenac sodium (VOLTAREN) 1 % GEL Apply 2 g topically 4 (four) times daily as needed for pain. 12/08/18   [provider]  glipiZIDE (GLUCOTROL XL) 5 MG 24 hr tablet Take 1 tablet (5 mg total) by mouth daily with breakfast. 12/07/20   Philemon Kingdom, MD  hydrOXYzine (ATARAX/VISTARIL) 25 MG tablet TAKE 1 TABLET (25 MG TOTAL) BY MOUTH EVERY 6 (SIX) HOURS AS NEEDED FOR ITCHING. 08/23/20 08/23/21  Carlisle Cater, PA-C  metoprolol succinate (TOPROL XL) 25 MG 24 hr tablet Take 1 tablet (25 mg total) by mouth daily. 07/02/19   Troy Sine, MD  Multiple Vitamin (MULTIVITAMIN) capsule Take 1 capsule by mouth daily.    [provider]  omeprazole (PRILOSEC) 40 MG capsule Take 40 mg by mouth daily. 08/04/20   [provider]  Semaglutide, 1 MG/DOSE, (OZEMPIC, 1 MG/DOSE,) 4 MG/3ML SOPN Inject 1 mg into the skin once a week. 12/07/20   Philemon Kingdom, MD  triamcinolone (KENALOG) 0.1 % APPLY 1 APPLICATION TOPICALLY 2 (TWO) TIMES DAILY AS NEEDED. USE DIRECTLY TO ITCHY AREAS, AVOID FACE. 08/23/20 08/23/21  Carlisle Cater, PA-C  valACYclovir (VALTREX) 500 MG tablet Take 500 mg by mouth See admin instructions. Takes Mon,Tues,Wed bid 05/24/16   [provider]    Allergies    Other and Latex  Review of Systems   Review of Systems  Constitutional:  Negative for chills and fever.  Respiratory: Negative.  Negative for shortness of breath.   Cardiovascular: Negative.  Negative for chest pain.  Gastrointestinal:  Positive for  abdominal pain, nausea and vomiting.  Genitourinary:  Positive for difficulty urinating and flank pain. Negative for hematuria.  Skin: Negative.   Neurological: Negative.    Physical Exam Updated Vital Signs BP (!) 144/102 (BP Location: Left Arm)   Pulse 97   Temp 97.9 F (36.6 C) (Oral)   Resp 15   Ht _0  (1.676 m)   Wt 84.4 kg   SpO2 99%   BMI 30.02 kg/m   Physical Exam Vitals and nursing note reviewed.  Constitutional:      Appearance: She is well-developed. She is obese.  HENT:     Head: Normocephalic.  Cardiovascular:     Rate and Rhythm: Normal rate and regular rhythm.     Heart sounds: No murmur heard. Pulmonary:     Effort: Pulmonary effort is normal.     Breath sounds: Normal breath sounds. No wheezing, rhonchi or rales.  Abdominal:     General: Bowel sounds are normal. There is no distension.     Palpations: Abdomen is soft.     Tenderness: There is abdominal tenderness (Left upper and lower abdominal tenderness). There is no guarding or rebound.  Musculoskeletal:        General: Normal range of motion.     Cervical back: Normal range of motion and neck supple.  Skin:    General: Skin is warm and dry.  Neurological:     General: No focal deficit present.     Mental Status: She is alert and oriented to person, place, and time.    ED Results / Procedures / Treatments   Labs (all labs ordered are listed, but only abnormal results are displayed) Labs Reviewed - No data to display  EKG None  Radiology No results found.  Procedures Procedures   Medications Ordered in ED Medications - No data to display  ED Course  I have reviewed the triage vital signs and the nursing notes.  Pertinent labs & imaging results that were available during my care of the patient were reviewed by me and considered in my medical decision making (see chart for details).    MDM Rules/Calculators/A&P                           Patient to ED with left flank and left  sided abdominal pain of sudden onset around 1:00 am. No history of stones. No fever.   She appears very uncomfortable. IV started and pain/nausea addressed. She is more comfortable and nausea is resolved. VSS.   CT renal study confirms 1x2 mm stone proximal left ureter. It also demonstrates findings in a segment of sigmoid that is compatible with diverticulitis.   During the time while labs processing, the patient reports she developed right sided chest and arm pain. She has had similar in the past, seen by cardiology. 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   Troponins ordered. EKG appears unchanged from previous in 2020. If troponin negative, feel she is appropriate for discharge home: Cipro/Flagyl for diverticulitis Norco/Zofran for Kidney stone - refer to urology Follow up with cardiology outpatient  Patient care signed out to [facility], PA-C for follow up on Trononin.   Final Clinical Impression(s) / ED Diagnoses Final diagnoses:  None   Kidney stone, left Mild diverticulitis Nonspecific chest pain  Rx / DC Orders ED Discharge Orders     None        Dennie Bible 04/04/21 1610    Palumbo, November 20, 2022, MD 04/04/21 [phone]

## 2021-04-04 NOTE — Telephone Encounter (Signed)
   Pt said she was at the ED today due to CP and they found blockages, she was advised to f/u with Dr. Tresa Endo or APP as soon as possible. First available in in oct with APP and pt said she needs to f/u sooner

## 2021-04-04 NOTE — ED Provider Notes (Signed)
  Physical Exam  BP 112/71   Pulse 78   Temp 97.9 F (36.6 C) (Oral)   Resp 16   Ht 5\' 6"  (1.676 m)   Wt 84.4 kg   SpO2 96%   BMI 30.02 kg/m   Physical Exam  ED Course/Procedures   Clinical Course as of 04/04/21 0809  Mon Apr 04, 2021  0800 Troponin I (High Sensitivity): <2 [JS]    Clinical Course User Index [JS] Apr 06, 2021, PA-C    Procedures  MDM  Patient care assumed from [facility] [facility]. At shift change, please see her note for a full HPI. Briefly, patient here with flank pain, renal stone along with diverticulitis noted on Imaging. Pain is controlled with medication while in the ED. Patient had slight bump on Creatine level, rest of electrolyte are within normal limits. She aside reported some arm pain, plan is for obtaining troponin. If normal patient can follow up outpatient as she has in the past with cardiology.   8:08 AM troponin less than 2, will not repeat this at this time as patient is asymptomatic.  I have personally prescribed Norco for pain control, Flomax to help with stone expulsion, in addition she will be treated for diverticulitis today with Cipro Flagyl regimen.  Patient remained stable for discharge.   Portions of this note were generated with [place]. Dictation errors may occur despite best attempts at proofreading.        Scientist, clinical (histocompatibility and immunogenetics), PA-C 04/04/21 0809    Palumbo, April, MD 04/04/21 2330

## 2021-04-04 NOTE — ED Notes (Signed)
Ambulatory to restroom

## 2021-04-04 NOTE — Telephone Encounter (Signed)
Spoke to patient . Appointment schedule for 04/15/21 at 3:15 pm.   patient verbalized understanding

## 2021-04-04 NOTE — ED Notes (Signed)
Unable to provide urine specimen.  

## 2021-04-04 NOTE — ED Triage Notes (Signed)
Pt complains of left sided flank pain and emesis since this morning.

## 2021-04-04 NOTE — Discharge Instructions (Addendum)
You are prescribed medication today to treat your diverticulitis.  You will need to take the 2 different antibiotics as prescribed.  In addition, you were also given pain medication to help with your kidney stone.  These be aware you will need be able to drink alcohol or drive while taking this medication.  Follow-up with your primary care physician in 1 week for reevaluation of symptoms.

## 2021-04-12 ENCOUNTER — Encounter: Payer: Self-pay | Admitting: Internal Medicine

## 2021-04-12 ENCOUNTER — Other Ambulatory Visit: Payer: Self-pay

## 2021-04-12 ENCOUNTER — Ambulatory Visit (INDEPENDENT_AMBULATORY_CARE_PROVIDER_SITE_OTHER): Payer: Medicare HMO | Admitting: Internal Medicine

## 2021-04-12 VITALS — BP 128/78 | HR 91 | Ht 66.0 in | Wt 181.8 lb

## 2021-04-12 DIAGNOSIS — E1165 Type 2 diabetes mellitus with hyperglycemia: Secondary | ICD-10-CM

## 2021-04-12 DIAGNOSIS — E1159 Type 2 diabetes mellitus with other circulatory complications: Secondary | ICD-10-CM

## 2021-04-12 DIAGNOSIS — E785 Hyperlipidemia, unspecified: Secondary | ICD-10-CM

## 2021-04-12 DIAGNOSIS — E669 Obesity, unspecified: Secondary | ICD-10-CM | POA: Diagnosis not present

## 2021-04-12 LAB — POCT GLYCOSYLATED HEMOGLOBIN (HGB A1C): Hemoglobin A1C: 7.2 % — AB (ref 4.0–5.6)

## 2021-04-12 MED ORDER — DAPAGLIFLOZIN PROPANEDIOL 5 MG PO TABS: 5.0000 mg | ORAL_TABLET | Freq: Every day | ORAL | 3 refills | Status: DC

## 2021-04-12 MED ORDER — OZEMPIC (1 MG/DOSE) 4 MG/3ML ~~LOC~~ SOPN
1.0000 mg | PEN_INJECTOR | SUBCUTANEOUS | 3 refills | Status: DC
Start: 2021-04-12 — End: 2022-05-09

## 2021-04-12 NOTE — Patient Instructions (Addendum)
Please continue: - Glipizide ER 5 mg before breakfast - Ozempic 1 mg weekly  Please start: - Farxiga 5 mg daily before breakfast   Please return in 3-4 months with your sugar log.

## 2021-04-12 NOTE — Progress Notes (Signed)
Patient ID: Bonnie Gordon, female   DOB: 1955/11/28, 65 y.o.   MRN: [id number]  This visit occurred during the SARS-CoV-2 public health emergency.  Safety protocols were in place, including screening questions prior to the visit, additional usage of staff PPE, and extensive cleaning of exam room while observing appropriate contact time as indicated for disinfecting solutions.   HPI: Bonnie Gordon is a 65 y.o.-year-old female, presenting for follow-up for DM2, dx in 2012, insulin-independent, uncontrolled, with complications (CAD, PN). Last visit  4 months ago. She will go to the [facility] with [facility].  Interim history: No increased urination, nausea.  However, she continues to have diarrhea after every meal.  She also has blurry vision.  She is not sure whether this happens when blood sugars are high. She had elevated blood sugars due to a steroid injection in 01/2021.  At that time, she took some Victoza which she still had at home and sugars improved. Since last visit, she was in the emergency room with abd. pain 04/04/2021.  She was found to have kidney stones. 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 She will see cardiology on 04/15/2021. She tested positive for celiac ds. >> will see GI.  Reviewed HbA1c levels: Lab Results  Component Value Date   HGBA1C 6.8 (A) 12/07/2020   HGBA1C 6.9 (A) 08/09/2020   HGBA1C 7.2 (A) 04/06/2020   HGBA1C 8.3 (A) 11/25/2019   HGBA1C 7.1 (A) 07/29/2019   HGBA1C 7.7 (A) 04/01/2019   HGBA1C 7.5 (A) 07/15/2018   HGBA1C 8.1 08/24/2017   HGBA1C 6.8 04/25/2017   HGBA1C 6.5 01/18/2017   HGBA1C 6.2 10/18/2016   HGBA1C 7.5 07/20/2016   HGBA1C 9.5 04/20/2016   HGBA1C 8.1 (H) 08/30/2015   HGBA1C 7.1 (H) 02/25/2015   HGBA1C 7.0 (H) 08/26/2014   HGBA1C (H) 09/16/2009    6.5 (NOTE) The ADA recommends the following therapeutic goal for glycemic control related to Hgb A1c measurement: Goal of therapy: <6.5 Hgb A1c   Reference: American Diabetes Association: Clinical Practice Recommendations 2010, Diabetes Care, 2010, 33: (Suppl  1).  11/2018: HbA1c 7.2% 01/14/2016: HbA1c 11.3%  Patient is on: - Glipizide ER 5 mg before breakfast and dinner >> 5 mg before b'fast - Victoza 1.8 mg at night >> moved to a.m. >> Ozempic 0.5 >> 1 mg weekly She was previously on: - Levemir up to 60 units at bedtime-stopped 11/2018 - Glipizide 5 mg before dinner >> stopped  - Glimepiride 4 mg before b'fast/ Glipizide XL 10 mg in am  - Tradjenta 5 mg in am  These were stopped when she started insulin. She was on Metformin >> diarrhea.  Pt checks her sugars twice a day: - am:  94-165 >> 120-132 >> 94-139 >> 90s, 135-165 - 2h after b'fast: 127 >> n/c >> 126 >> n/c >> 141 >> n/c - before lunch: 100, 120, 153 >> n/c >> 64 >> n/c - 2h after lunch: n/c >> 167 (cake) >> n/c - before dinner: 68, 113 >> 80-120 >> n/c >> 130 >> n/c - 2h after dinner: n/c >> 196 x1 >> n/c - bedtime: 140-185 >> 100-180s >> 76-140 >> up to 145, except 205, 219 - nighttime: n/c Lowest sugar was 64 >> ...  85 >> 76 >> 90s; it is unclear at which level she has hypoglycemia awareness. Highest sugar was 409 (?) >>... 180s >> 141 >> 205, 219.  Glucometer: AccuChek [place]  Meals: fish  -  fried - once a month when she eats out; no red meat.  Salads with ranch dressing.  -No CKD, last BUN/creatinine: Lab Results  Component Value Date   BUN 12 04/04/2021   BUN 8 08/05/2020   CREATININE 1.08 (H) 04/04/2021   CREATININE 0.85 08/05/2020  01/13/2016: 12/0.83, glucose 472  -+ HL; last set of lipids: Lab Results  Component Value Date   CHOL 148 07/29/2019   HDL 56 07/29/2019   LDLCALC 65 07/29/2019   TRIG 160 (H) 07/29/2019   CHOLHDL 2.6 07/29/2019  01/13/2016: 147/218/73/69 On Lipitor 20.  Previously on Crestor.  - last eye exam was in 11/2020: Reportedly no DR, + incipient stable cataract. Sees [facility].  -+ Numbness and tingling in her  feet.  She sees podiatry.  Previously on Neurontin. I suggested alpha-lipoic acid and B complex in the past.  She was admitted with CP 05/2016 (found to have hypokalemia, mm cramps). On omeprazole -this is helping her GERD.  ROS: + See HPI  I reviewed pt's medications, allergies, PMH, social hx, family hx, and changes were documented in the history of present illness. Otherwise, unchanged from my initial visit note.  Past Medical History:  Diagnosis Date   Anxiety    Bipolar disorder ([place])    Chest pain    a. 01/2003 MV w/ ? ant-inflat ischemia-->Cath: LAD 20p, otw nonobs dzs-->Med Rx; b. 2009 Cath: LAD 30, LCX nl/nondominant, RCA 30, nl EF; c. 08/2014 MV: no ischemia/infarct.   Complication of anesthesia    pt. states she is difficult to wake up   Depression    Diabetes mellitus    Type II   Diabetic neuropathy (HCC)    Diverticulitis    GERD (gastroesophageal reflux disease)    Headache    History of bronchitis    Hypercholesteremia    Hypertension    Hypokalemia    PONV (postoperative nausea and vomiting)    Shortness of breath dyspnea    Sleep apnea    Vertigo    patient reported   Past Surgical History:  Procedure Laterality Date   ABDOMINAL HYSTERECTOMY     APPENDECTOMY     CARDIAC CATHETERIZATION  2009   normal cath   CARDIAC CATHETERIZATION  2004   normal cath   CHOLECYSTECTOMY     TONSILLECTOMY     TONSILLECTOMY/ADENOIDECTOMY/TURBINATE REDUCTION Bilateral 03/01/2015   Procedure: TONSILLECTOMY/TURBINATE REDUCTION;  Surgeon: Rozetta Nunnery, MD;  Location: [facility];  Service: ENT;  Laterality: Bilateral;   UVULOPALATOPHARYNGOPLASTY Bilateral 03/01/2015   Procedure: UVULOPALATOPHARYNGOPLASTY (UPPP);  Surgeon: Rozetta Nunnery, MD;  Location: [facility] OR;  Service: ENT;  Laterality: Bilateral;   WISDOM TOOTH EXTRACTION     Social History   Social History   Marital status: Divorced    Spouse name: N/A   Number of children: 2   Occupational History    Disability    Social History Main Topics   Smoking status: Former Smoker    Quit date: 08/07/1982   Smokeless tobacco: Never Used   Alcohol use Yes     Comment: Drinks during the weekends 1 beer or 1 drink of liquor per day    Drug use: No   Sexual activity: Yes    Partners: Male    Birth control/ protection: Surgical   Current Outpatient Medications on File Prior to Visit  Medication Sig Dispense Refill   ACCU-CHEK SMARTVIEW test strip USE AS INSTRUCTED TO CHECK SUGAR 2 TIMES DAILY 200 strip 5   amLODipine (NORVASC)  5 MG tablet TAKE 1 TABLET (5 MG TOTAL) BY MOUTH DAILY. 90 tablet 3   aspirin EC 81 MG tablet Take 81 mg by mouth daily.     atorvastatin (LIPITOR) 20 MG tablet Take 20 mg by mouth every evening.      b complex vitamins capsule Take 1 capsule by mouth daily.     Blood Glucose Monitoring Suppl (ACCU-CHEK NANO SMARTVIEW) w/Device KIT USE TO CHECK SUGAR DAILY. 1 kit 0   celecoxib (CELEBREX) 200 MG capsule Take 200 mg by mouth daily as needed.      diclofenac sodium (VOLTAREN) 1 % GEL Apply 2 g topically 4 (four) times daily as needed for pain.     glipiZIDE (GLUCOTROL XL) 5 MG 24 hr tablet Take 1 tablet (5 mg total) by mouth daily with breakfast. 180 tablet 3   hydrOXYzine (ATARAX/VISTARIL) 25 MG tablet TAKE 1 TABLET (25 MG TOTAL) BY MOUTH EVERY 6 (SIX) HOURS AS NEEDED FOR ITCHING. 20 tablet 0   metoprolol succinate (TOPROL XL) 25 MG 24 hr tablet Take 1 tablet (25 mg total) by mouth daily. 90 tablet 3   Multiple Vitamin (MULTIVITAMIN) capsule Take 1 capsule by mouth daily.     omeprazole (PRILOSEC) 40 MG capsule Take 40 mg by mouth daily.     Semaglutide, 1 MG/DOSE, (OZEMPIC, 1 MG/DOSE,) 4 MG/3ML SOPN Inject 1 mg into the skin once a week. 9 mL 3   triamcinolone (KENALOG) 0.1 % APPLY 1 APPLICATION TOPICALLY 2 (TWO) TIMES DAILY AS NEEDED. USE DIRECTLY TO ITCHY AREAS, AVOID FACE. 30 g 0   valACYclovir (VALTREX) 500 MG tablet Take 500 mg by mouth See admin instructions. Takes  Mon,Tues,Wed bid     No current facility-administered medications on file prior to visit.   Allergies  Allergen Reactions   Other Other (See Comments)    No Blood Products, personal preference   Latex Itching and Rash   Family History  Problem Relation Age of Onset   Diabetes Mother    Gout Mother    Hypertension Mother    Arthritis Mother    Heart disease Father    Heart failure Sister    Arthritis Sister    Heart disease Maternal Grandmother    Diabetes Maternal Grandmother    Heart disease Paternal Grandmother    Diabetes Paternal Grandmother    Diabetes Sister    Bursitis Sister    Crohn's disease Sister    PE: BP 128/78 (BP Location: Right Arm, Patient Position: Sitting, Cuff Size: Normal)   Pulse 91   Ht 5' 6"  (1.676 m)   Wt 181 lb 12.8 oz (82.5 kg)   SpO2 97%   BMI 29.34 kg/m  Wt Readings from Last 3 Encounters:  04/12/21 181 lb 12.8 oz (82.5 kg)  04/04/21 186 lb (84.4 kg)  12/07/20 186 lb (84.4 kg)   Constitutional: overweight, in NAD Eyes: PERRLA, EOMI, no exophthalmos ENT: moist mucous membranes, no thyromegaly, no cervical lymphadenopathy Cardiovascular: RRR, No MRG Respiratory: CTA B Gastrointestinal: abdomen soft, NT, ND, BS+ Musculoskeletal: no deformities, strength intact in all 4 Skin: moist, warm, no rashes Neurological: no tremor with outstretched hands, DTR normal in all 4  ASSESSMENT: 1. DM2, insulin-independent now, uncontrolled, with complications - CAD - PN  2. HL  3.  Obesity class I  PLAN:  1. Patient with longstanding, uncontrolled, type 2 diabetes, improved after changing GLP-1 receptor agonist formulations from Victoza to [place].  HbA1c also improved, being 6.8% in 12/2020.  At  last visit, most of her blood sugars were at goal.  I suggested to increase the dose of Ozempic mostly to help with her weight, as she was tolerating the lower dose well.  We also stopped her glipizide before dinner. -At this visit, sugars are higher  than before in the morning.  She occasionally has a blood sugar in the 90s if she did not eat much the day before, but most of the blood sugars are above target.  Today during the day, she is not usually checking.  She experiences blurry vision at times and I advised her to check her blood sugars during these episodes to see if they are high or low.  At that time, sugars are usually at goal, with few exceptions.  For now, especially in the setting of her coronary artery disease and inability to lose weight, I advised her to start an SGLT2 inhibitor.  We will start a low-dose [place].  Discussed about benefits and possible side effects. - I suggested to:  Patient Instructions  Please continue: - Glipizide ER 5 mg before breakfast - Ozempic 1 mg weekly  Please start: - Farxiga 5 mg daily before breakfast   Please return in 3-4 months with your sugar log.   - we checked her HbA1c: 7.2% (higher)  - advised to check sugars at different times of the day - 1x a day, rotating check times - advised for yearly eye exams >> she is UTD - return to clinic in 3-4 months  2. HL -Reviewed latest available lipid panel from 07/2019: Triglycerides slightly high, otherwise at goal: Lab Results  Component Value Date   CHOL 148 07/29/2019   HDL 56 07/29/2019   LDLCALC 65 07/29/2019   TRIG 160 (H) 07/29/2019   CHOLHDL 2.6 07/29/2019  -Continues Lipitor 20 mg daily without side effects -She had labs with PCP including a lipid panel but I do not have these records.  However, she just establish care with another PCP at [facility] and will have labs with them next week.  She will bring this at next visit.  3.  Obesity class I -At last visit, we increased her Ozempic dose and decrease her glipizide dose.  Both these measures should help with weight loss. -she gained 8 lbs before last visit,  and lost 5 lbs since then -At today's visit, we will also try to add [place].  This should also help with weight  loss.  Philemon Kingdom, MD PhD [facility] Endocrinology

## 2021-04-14 NOTE — Progress Notes (Signed)
Cardiology Clinic Note   Patient Name: [name] Date of Encounter: 04/15/2021  Primary Care Provider:  No primary care provider on file. Primary Cardiologist:  Shelva Majestic, MD  Patient Profile    Bonnie Gordon 65 year old female presents the clinic today for follow-up evaluation of her chest discomfort.  Past Medical History    Past Medical History:  Diagnosis Date   Anxiety    Bipolar disorder ([place])    Chest pain    a. 01/2003 MV w/ ? ant-inflat ischemia-->Cath: LAD 20p, otw nonobs dzs-->Med Rx; b. 2009 Cath: LAD 30, LCX nl/nondominant, RCA 30, nl EF; c. 08/2014 MV: no ischemia/infarct.   Complication of anesthesia    pt. states she is difficult to wake up   Depression    Diabetes mellitus    Type II   Diabetic neuropathy (HCC)    Diverticulitis    GERD (gastroesophageal reflux disease)    Headache    History of bronchitis    Hypercholesteremia    Hypertension    Hypokalemia    PONV (postoperative nausea and vomiting)    Shortness of breath dyspnea    Sleep apnea    Vertigo    patient reported   Past Surgical History:  Procedure Laterality Date   ABDOMINAL HYSTERECTOMY     APPENDECTOMY     CARDIAC CATHETERIZATION  2009   normal cath   CARDIAC CATHETERIZATION  2004   normal cath   CHOLECYSTECTOMY     TONSILLECTOMY     TONSILLECTOMY/ADENOIDECTOMY/TURBINATE REDUCTION Bilateral 03/01/2015   Procedure: TONSILLECTOMY/TURBINATE REDUCTION;  Surgeon: Rozetta Nunnery, MD;  Location: [facility];  Service: ENT;  Laterality: Bilateral;   UVULOPALATOPHARYNGOPLASTY Bilateral 03/01/2015   Procedure: UVULOPALATOPHARYNGOPLASTY (UPPP);  Surgeon: Rozetta Nunnery, MD;  Location: [facility];  Service: ENT;  Laterality: Bilateral;   WISDOM TOOTH EXTRACTION      Allergies  Allergies  Allergen Reactions   Other Other (See Comments)    No Blood Products, personal preference   Latex Itching and Rash    History of Present Illness    Bonnie Gordon has a PMH  ofCAD, OSA on CPAP, HLD with LDL goal < 70, HTN, DM, RBBB, and obesity. She has a history of cath in 2004 and 2009 with nonobstructive disease, no intervention; normal myoview in 2016 and 2017. She was just seen in the ER 03/14/19 for chest pain and dyspnea. She ruled out with two negative high sensitivity troponins and no acute findings on EKG. Her O2 saturation was 100% on room air. Dr. Johnsie Cancel  Evaluated her in the ER anf elt she was stable for discharge with close cardiology follow up - possible outpatient CT coronary if symptoms persist. She follows with Dr. Claiborne Billings. She has OSA not compliant on CPAP.   She presented 03/26/2019 for ER follow up.  She stated that she continued to have chest pain and dyspnea with exertion.  She stated her chest pain and dyspnea had been getting worse over the last 5 years.  She was sleeping in a mechanical bed and stated that the head is elevated and that she noticed that she slept better the higher the head of the bed was.  She continued to be active and dance in her house however, she stated that she noticed  she was only able to do about 15 minutes of activity at a time before she has to stop and take a break.  She did not take any medications to help with her symptoms, she would  rest and allow the symptoms to go away.  She went on to say that about 4 years ago her PCP prescribed and albuterol inhaler that helped with her shortness of breath.  However, she was instructed to stop taking it because it was believed that she did not have asthma.  Finally she stated at times she felt like she might faint and was dizzy, the symptoms are relieved by rest as well.  She was seen by Fabian Sharp PA-C on 12/02/2019.  During that time she reported she had COVID 2/21.  She presented to UC for chest pain and requested COVID testing.  Her boyfriend and employer also had symptoms of shortness of breath.  Her PCP started her on prednisone which significantly improved her shortness of breath.  She  denied cardiac complaints during that time.  She was maintained on her beta-blocker and her amlodipine.  She was seen in the emergency department on 04/04/2021 with flank pain, renal stone, and nonspecific chest discomfort.  She was noted to have a renal stone along with diverticulitis on imaging.  She was noted to have a slight increase in her creatinine level.  Her EKG showed sinus rhythm with PACs, RBBB 85 bpm.  Her troponin was less than 2.  She received Cipro, pain medication, metronidazole, and tamsulosin.  She was discharged in stable condition.  She presents to the clinic today for follow-up evaluation and states she feels well.  She reports intermittent brief episodes of right-sided shoulder/chest discomfort.  She reports that her discomfort can come on both at rest and with physical activity.  She has had previous right shoulder injury.  We reviewed her recent ER lab work and previous angiography.  We also reviewed her previous echocardiogram.  She expressed understanding.  She reports that her breathing is slightly worse since having COVID in the spring.  She recently had a physical at her PCP.  He has drawn lab work there as well.  We will request her most recent lipid panel.  I will give her the salty 6 diet sheet and have her increase her physical activity as tolerated, and follow-up in 6 months.    Today she denies lower extremity edema, fatigue, palpitations, melena, hematuria, hemoptysis, diaphoresis, weakness, and PND.  Home Medications    Prior to Admission medications   Medication Sig Start Date End Date Taking? Authorizing Provider  ACCU-CHEK SMARTVIEW test strip USE AS INSTRUCTED TO CHECK SUGAR 2 TIMES DAILY 07/05/20   Philemon Kingdom, MD  amLODipine (NORVASC) 5 MG tablet TAKE 1 TABLET (5 MG TOTAL) BY MOUTH DAILY. 05/20/20   Ledora Bottcher, PA  aspirin EC 81 MG tablet Take 81 mg by mouth daily.    [provider]  atorvastatin (LIPITOR) 20 MG tablet Take 20 mg by  mouth every evening.  12/30/18   [provider]  b complex vitamins capsule Take 1 capsule by mouth daily. Patient not taking: Reported on 04/12/2021    [provider]  Blood Glucose Monitoring Suppl (ACCU-CHEK NANO SMARTVIEW) w/Device KIT USE TO CHECK SUGAR DAILY. 04/08/20   Philemon Kingdom, MD  celecoxib (CELEBREX) 200 MG capsule Take 200 mg by mouth daily as needed.     [provider]  dapagliflozin propanediol (FARXIGA) 5 MG TABS tablet Take 1 tablet (5 mg total) by mouth daily before breakfast. 04/12/21   Philemon Kingdom, MD  diclofenac sodium (VOLTAREN) 1 % GEL Apply 2 g topically 4 (four) times daily as needed for pain. 12/08/18  [provider]  glipiZIDE (GLUCOTROL XL) 5 MG 24 hr tablet Take 1 tablet (5 mg total) by mouth daily with breakfast. 12/07/20   Philemon Kingdom, MD  hydrOXYzine (ATARAX/VISTARIL) 25 MG tablet TAKE 1 TABLET (25 MG TOTAL) BY MOUTH EVERY 6 (SIX) HOURS AS NEEDED FOR ITCHING. 08/23/20 08/23/21  Carlisle Cater, PA-C  metoprolol succinate (TOPROL XL) 25 MG 24 hr tablet Take 1 tablet (25 mg total) by mouth daily. 07/02/19   Troy Sine, MD  Multiple Vitamin (MULTIVITAMIN) capsule Take 1 capsule by mouth daily.    [provider]  omeprazole (PRILOSEC) 40 MG capsule Take 40 mg by mouth daily. 08/04/20   [provider]  Semaglutide, 1 MG/DOSE, (OZEMPIC, 1 MG/DOSE,) 4 MG/3ML SOPN Inject 1 mg into the skin once a week. 04/12/21   Philemon Kingdom, MD  triamcinolone (KENALOG) 0.1 % APPLY 1 APPLICATION TOPICALLY 2 (TWO) TIMES DAILY AS NEEDED. USE DIRECTLY TO ITCHY AREAS, AVOID FACE. 08/23/20 08/23/21  Carlisle Cater, PA-C  valACYclovir (VALTREX) 500 MG tablet Take 500 mg by mouth See admin instructions. Takes Mon,Tues,Wed bid 05/24/16   [provider]    Family History    Family History  Problem Relation Age of Onset   Diabetes Mother    Gout Mother    Hypertension Mother    Arthritis Mother    Heart disease  Father    Heart failure Sister    Arthritis Sister    Heart disease Maternal Grandmother    Diabetes Maternal Grandmother    Heart disease Paternal Grandmother    Diabetes Paternal Grandmother    Diabetes Sister    Bursitis Sister    Crohn's disease Sister    She indicated that her mother is deceased. She indicated that her father is deceased. She indicated that all of her three sisters are alive. She indicated that her maternal grandmother is deceased. She indicated that her maternal grandfather is deceased. She indicated that her paternal grandmother is deceased. She indicated that her paternal grandfather is deceased.  Social History    Social History   Socioeconomic History   Marital status: Divorced    Spouse name: Not on file   Number of children: Not on file   Years of education: some college   Highest education level: Not on file  Occupational History   Occupation: Retired Quarry manager  Tobacco Use   Smoking status: Former    Types: Cigarettes    Quit date: 08/07/1982    Years since quitting: 38.7   Smokeless tobacco: Never  Vaping Use   Vaping Use: Never used  Substance and Sexual Activity   Alcohol use: Yes    Comment: occasional / social   Drug use: No   Sexual activity: Yes    Partners: Male    Birth control/protection: Surgical  Other Topics Concern   Not on file  Social History Narrative   Lives at home alone.   Right-handed.    1 cup caffeine per day.   Social Determinants of Health   Financial Resource Strain: Not on file  Food Insecurity: Not on file  Transportation Needs: Not on file  Physical Activity: Not on file  Stress: Not on file  Social Connections: Not on file  Intimate Partner Violence: Not on file     Review of Systems    General:  No chills, fever, night sweats or weight changes.  Cardiovascular:  No chest pain, dyspnea on exertion, edema, orthopnea, palpitations, paroxysmal nocturnal dyspnea. Dermatological: No rash,  lesions/masses Respiratory: No cough, dyspnea Urologic: No hematuria, dysuria Abdominal:   No nausea, vomiting, diarrhea, bright red blood per rectum, melena, or hematemesis Neurologic:  No visual changes, wkns, changes in mental status. All other systems reviewed and are otherwise negative except as noted above.  Physical Exam    VS:  BP 130/76   Pulse 93   Ht _0  (1.676 m)   Wt 181 lb 3.2 oz (82.2 kg)   SpO2 96%   BMI 29.25 kg/m  , BMI Body mass index is 29.25 kg/m. GEN: Well nourished, well developed, in no acute distress. HEENT: normal. Neck: Supple, no JVD, carotid bruits, or masses. Cardiac: RRR, no murmurs, rubs, or gallops. No clubbing, cyanosis, edema.  Radials/DP/PT 2+ and equal bilaterally.  Respiratory:  Respirations regular and unlabored, clear to auscultation bilaterally. GI: Soft, nontender, nondistended, BS + x 4. MS: no deformity or atrophy. Skin: warm and dry, no rash. Neuro:  Strength and sensation are intact. Psych: Normal affect.  Accessory Clinical Findings    Recent Labs: 04/04/2021: ALT 37; BUN 12; Creatinine, Ser 1.08; Hemoglobin 15.3; Platelets 198; Potassium 4.9; Sodium 141   Recent Lipid Panel    Component Value Date/Time   CHOL 148 07/29/2019 1047   TRIG 160 (H) 07/29/2019 1047   HDL 56 07/29/2019 1047   CHOLHDL 2.6 07/29/2019 1047   CHOLHDL 2.1 02/29/2016 0856   VLDL 19 02/29/2016 0856   LDLCALC 65 07/29/2019 1047    ECG personally reviewed by me today-none today.  EKG 04/04/2021 sinus rhythm with PACs, RBBB 85 bpm.  EKG 03/26/2019 normal sinus rhythm with right bundle branch block 83 bpm no acute changes  Myocardial perfusion study 03/09/2016 The left ventricular ejection fraction is mildly decreased (45-54%). Nuclear stress EF: 49%. There was no ST segment deviation noted during stress. This is a low risk study.   Normal resting and stress perfusion. No ischemia or infarction EF EF calculated at 49% but no discreet RWMA    Echocardiogram 08/12/2014 Study Conclusions   - Left ventricle: The cavity size was normal. There was mild focal    basal hypertrophy of the septum. Systolic function was normal.    The estimated ejection fraction was in the range of 55% to 60%.    Wall motion was normal; there were no regional wall motion    abnormalities.  - Atrial septum: No defect or patent foramen ovale was identified.    Echo contrast study showed no right-to-left atrial level shunt,    at baseline or with provocation.   Cardiac catheterization 03/24/2008   Left main normal, LAD approximately 30% smooth proximal stenosis, circumflex nondominant normal, ramus branch moderate in size and normal, right coronary artery with 30% stenosis.  LVEF greater than 60% with focal wall motion abnormalities.  Non-critical coronary artery disease with normal left ventricular function medical therapy recommended.  May need GI evaluation (Dr. Claiborne Billings)  Assessment & Plan   Atypical chest pain-continues with stable chronic chest discomfort.  Sress test 03/2016 indicated low risk, estimated ejection fraction 45 to 54%.  Echocardiogram 08/12/2014 normal study EF 55 to 60%.  Recent ED visit on 03/14/2019 for increased dyspnea and chest pain ruled out for MI.  Patient indicates dyspnea is getting worse with time and physical activity.  Continue amlodipine Heart healthy low-sodium diet-salty 6 given Increase physical activity as tolerated     Hypertension-BP today 130/76.  Well-controlled at home.  Reports compliance with medications.  BUN/creatinine stable. Continue amlodipine, Spironolactone, metoprolol Heart healthy  low-sodium diet Increase physical activity as tolerated    Obstructive sleep apnea-repeat study indicated she was low risk at 2.9 AHI.  Very mild sleep apnea with supine position at 5.1 and during her REM sleep at 7.2/H.  Recommendation was that if she gained more weight or became more sleepy a repeat study would be warranted.    Continue weight loss Continue to monitor    Hyperlipidemia-reports compliance with statin therapy Finish Atorvastatin Restart rosuvastatin 20 mg daily Increase physical activity as tolerated low-sodium heart healthy diet Monitored by PCP Request labs from [facility]   Disposition: Follow-up with Dr. Claiborne Billings in 6 months.  Jossie Ng. Jenet Durio NP-C    04/15/2021, 3:57 PM [address] Office [phone] Fax [phone]  Notice: This dictation was prepared with Dragon dictation along with smaller phrase technology. Any transcriptional errors that result from this process are unintentional and may not be corrected upon review.  I spent 14 minutes examining this patient, reviewing medications, and using patient centered shared decision making involving her cardiac care.  Prior to her visit I spent greater than 20 minutes reviewing her past medical history,  medications, and prior cardiac tests.

## 2021-04-15 ENCOUNTER — Other Ambulatory Visit: Payer: Self-pay

## 2021-04-15 ENCOUNTER — Encounter: Payer: Self-pay | Admitting: General Practice

## 2021-04-15 ENCOUNTER — Ambulatory Visit (INDEPENDENT_AMBULATORY_CARE_PROVIDER_SITE_OTHER): Payer: Medicare HMO | Admitting: General Practice

## 2021-04-15 VITALS — BP 130/76 | HR 93 | Ht 66.0 in | Wt 181.2 lb

## 2021-04-15 DIAGNOSIS — I10 Essential (primary) hypertension: Secondary | ICD-10-CM | POA: Diagnosis not present

## 2021-04-15 DIAGNOSIS — R0789 Other chest pain: Secondary | ICD-10-CM

## 2021-04-15 DIAGNOSIS — G4733 Obstructive sleep apnea (adult) (pediatric): Secondary | ICD-10-CM

## 2021-04-15 DIAGNOSIS — E785 Hyperlipidemia, unspecified: Secondary | ICD-10-CM

## 2021-04-15 MED ORDER — ROSUVASTATIN CALCIUM 40 MG PO TABS: 40.0000 mg | ORAL_TABLET | Freq: Every day | ORAL | 3 refills | Status: DC

## 2021-04-15 MED ORDER — ROSUVASTATIN CALCIUM 20 MG PO TABS: 20.0000 mg | ORAL_TABLET | Freq: Every day | ORAL | 3 refills | Status: DC

## 2021-04-15 NOTE — Patient Instructions (Signed)
Medication Instructions:  FINISH ATORVASTATIN 40MG  THEN.....  START ROSUVASTATIN 20MG   *If you need a refill on your cardiac medications before your next appointment, please call your pharmacy*  Lab Work:   Testing/Procedures:  NONE    NONE  Special Instructions PLEASE READ AND FOLLOW INCREASED FIBER DIET-ATTACHED  PLEASE INCREASE PHYSICAL ACTIVITY AS TOLERATED,   HAVE YOU DR HUNG FAX OVER CARDIAC CLEARANCE REQUEST [phone]  Follow-Up: Your next appointment:  6 month(s) In Person with , MD   At [facility], you and your health needs are our priority.  As part of our continuing mission to provide you with exceptional heart care, we have created designated Provider Care Teams.  These Care Teams include your primary Cardiologist (physician) and Advanced Practice Providers (APPs -  Physician Assistants and Nurse Practitioners) who all work together to provide you with the care you need, when you need it.  We recommend signing up for the patient portal called "MyChart".  Sign up information is provided on this After Visit Summary.  MyChart is used to connect with patients for Virtual Visits (Telemedicine).  Patients are able to view lab/test results, encounter notes, upcoming appointments, etc.  Non-urgent messages can be sent to your provider as well.   To learn more about what you can do with MyChart, go to [facility] [place].            High-Fiber Eating Plan Fiber, also called dietary fiber, is a type of carbohydrate. It is found foods such as fruits, vegetables, whole grains, and beans. A high-fiber diet can have many health benefits. Your health care provider may recommend a high-fiber diet to help: Prevent constipation. Fiber can make your bowel movements more regular. Lower your cholesterol. Relieve the following conditions: Inflammation of veins in the anus (hemorrhoids). Inflammation of specific areas of the digestive tract (uncomplicated  diverticulosis). A problem of the large intestine, also called the colon, that sometimes causes pain and diarrhea (irritable bowel syndrome, or IBS). Prevent overeating as part of a weight-loss plan. Prevent heart disease, type 2 diabetes, and certain cancers. What are tips for following this plan? Reading food labels  Check the nutrition facts label on food products for the amount of dietary fiber. Choose foods that have 5 grams of fiber or more per serving. The goals for recommended daily fiber intake include: Men (age 22 or younger): 34-38 g. Men (over age 10): 28-34 g. Women (age 45 or younger): 25-28 g. Women (over age 14): 22-25 g. Your daily fiber goal is _____________ g. Shopping Choose whole fruits and vegetables instead of processed forms, such as apple juice or applesauce. Choose a wide variety of high-fiber foods such as avocados, lentils, oats, and kidney beans. Read the nutrition facts label of the foods you choose. Be aware of foods with added fiber. These foods often have high sugar and sodium amounts per serving. Cooking Use whole-grain flour for baking and cooking. Cook with brown rice instead of white rice. Meal planning Start the day with a breakfast that is high in fiber, such as a cereal that contains 5 g of fiber or more per serving. Eat breads and cereals that are made with whole-grain flour instead of refined flour or white flour. Eat brown rice, bulgur wheat, or millet instead of white rice. Use beans in place of meat in soups, salads, and pasta dishes. Be sure that half of the grains you eat each day are whole grains. General information You can get the recommended daily intake of  dietary fiber by: Eating a variety of fruits, vegetables, grains, nuts, and beans. Taking a fiber supplement if you are not able to take in enough fiber in your diet. It is better to get fiber through food than from a supplement. Gradually increase how much fiber you consume. If you  increase your intake of dietary fiber too quickly, you may have bloating, cramping, or gas. Drink plenty of water to help you digest fiber. Choose high-fiber snacks, such as berries, raw vegetables, nuts, and popcorn. What foods should I eat? Fruits Berries. Pears. Apples. Oranges. Avocado. Prunes and raisins. Dried figs. Vegetables Sweet potatoes. Spinach. Kale. Artichokes. Cabbage. Broccoli. Cauliflower. Green peas. Carrots. Squash. Grains Whole-grain breads. Multigrain cereal. Oats and oatmeal. Brown rice. Barley. Bulgur wheat. Millet. Quinoa. Bran muffins. Popcorn. Rye wafer crackers. Meats and other proteins Navy beans, kidney beans, and pinto beans. Soybeans. Split peas. Lentils. Nuts and seeds. Dairy Fiber-fortified yogurt. Beverages Fiber-fortified soy milk. Fiber-fortified orange juice. Other foods Fiber bars. The items listed above may not be a complete list of recommended foods and beverages. Contact a dietitian for more information. What foods should I avoid? Fruits Fruit juice. Cooked, strained fruit. Vegetables Fried potatoes. Canned vegetables. Well-cooked vegetables. Grains White bread. Pasta made with refined flour. White rice. Meats and other proteins Fatty cuts of meat. Fried chicken or fried fish. Dairy Milk. Yogurt. Cream cheese. Sour cream. Fats and oils Butters. Beverages Soft drinks. Other foods Cakes and pastries. The items listed above may not be a complete list of foods and beverages to avoid. Talk with your dietitian about what choices are best for you. Summary Fiber is a type of carbohydrate. It is found in foods such as fruits, vegetables, whole grains, and beans. A high-fiber diet has many benefits. It can help to prevent constipation, lower blood cholesterol, aid weight loss, and reduce your risk of heart disease, diabetes, and certain cancers. Increase your intake of fiber gradually. Increasing fiber too quickly may cause cramping, bloating,  and gas. Drink plenty of water while you increase the amount of fiber you consume. The best sources of fiber include whole fruits and vegetables, whole grains, nuts, seeds, and beans. This information is not intended to replace advice given to you by your health care provider. Make sure you discuss any questions you have with your health care provider. Document Revised: 11/27/2019 Document Reviewed: 11/27/2019 Elsevier Patient Education  2022 [employer].

## 2021-04-18 ENCOUNTER — Telehealth: Payer: Self-pay

## 2021-04-18 NOTE — Telephone Encounter (Signed)
   [facility] Pre-operative Risk Assessment    Patient Name: Bonnie Gordon  DOB: 1956/03/01 MRN: [id number]  HEARTCARE STAFF:  - IMPORTANT!!!!!! Under Visit Info/Reason for Call, type in Other and utilize the format Clearance MM/DD/YY or Clearance TBD. Do not use dashes or single digits. - Please review there is not already an duplicate clearance open for this procedure. - If request is for dental extraction, please clarify the # of teeth to be extracted. - If the patient is currently at the dentist's office, call Pre-Op Callback Staff (MA/nurse) to input urgent request.  - If the patient is not currently in the dentist office, please route to the Pre-Op pool.  Request for surgical clearance:  What type of surgery is being performed? EGD with Propofol   When is this surgery scheduled? TBD  What type of clearance is required (medical clearance vs. Pharmacy clearance to hold med vs. Both)? Medical   Are there any medications that need to be held prior to surgery and how long? None   Practice name and name of physician performing surgery? [facility], [place] by Dr. Benson Norway   What is the office phone number? [phone]   7.   What is the office fax number? [phone]  8.   Anesthesia type (None, local, MAC, general) ? Unknown   Zebedee Iba 04/18/2021, 11:03 AM  _________________________________________________________________   (provider comments below)

## 2021-04-18 NOTE — Telephone Encounter (Signed)
   Name: Bonnie Gordon  DOB: 23-Apr-1956  MRN: [id number]   Primary Cardiologist: Bonnie Guadalajara, MD  Chart reviewed as part of pre-operative protocol coverage. Patient was contacted 04/18/2021 in reference to pre-operative risk assessment for pending surgery as outlined below.  Bonnie Gordon was last seen on 04/15/21 by Bonnie Fabian NP.  Since that day, Bonnie Gordon has done well. She has a history of nonobstructive CAD with most recent evaluation by CT coronary in 2020. She denies any further chest discomfort. Her physical activity is primarily limited by breathing, she states she is still recovering from COVID. She can complete more than 4.0 METS without angina (volleyball for 2 hrs).  Therefore, based on ACC/AHA guidelines, the patient would be at acceptable risk for the planned procedure without further cardiovascular testing.   The patient was advised that if she develops new symptoms prior to surgery to contact our office to arrange for a follow-up visit, and she verbalized understanding.  I will route this recommendation to the requesting party via Epic fax function and remove from pre-op pool. Please call with questions.  Bonnie Rutherford Khrystyna Schwalm, PA 04/18/2021, 4:40 PM

## 2021-04-20 ENCOUNTER — Other Ambulatory Visit: Payer: Self-pay | Admitting: Family Medicine

## 2021-04-20 ENCOUNTER — Ambulatory Visit
Admission: RE | Admit: 2021-04-20 | Discharge: 2021-04-20 | Disposition: A | Discharge status: Home / Self Care | Payer: Medicare HMO | Source: Ambulatory Visit | Attending: Family Medicine | Admitting: Family Medicine

## 2021-04-20 ENCOUNTER — Other Ambulatory Visit: Payer: Medicare HMO

## 2021-04-20 DIAGNOSIS — N201 Calculus of ureter: Secondary | ICD-10-CM

## 2021-05-03 ENCOUNTER — Telehealth: Payer: Self-pay | Admitting: Internal Medicine

## 2021-05-03 NOTE — Telephone Encounter (Signed)
Ins pays for Dexcom, [employer]. Pt says you can choose which one she should have. Pt  contact [phone]

## 2021-05-03 NOTE — Telephone Encounter (Signed)
Patient called to request New RX for Blood Glucose Monitoring Kit with all supplies (previously prescribed was Blood Glucose Monitoring Device (ACCU-CHEK NANO SMARTVIEW) w/Device KIT-Rx was denied by insurance but now Patient has changed insurance) [facility] to: [id number] [place] [place]:  [phone] Fax:  [phone]

## 2021-05-06 ENCOUNTER — Other Ambulatory Visit: Payer: Self-pay | Admitting: General Practice

## 2021-05-09 ENCOUNTER — Other Ambulatory Visit: Payer: Self-pay

## 2021-05-09 ENCOUNTER — Telehealth: Payer: Self-pay | Admitting: Internal Medicine

## 2021-05-09 DIAGNOSIS — E1159 Type 2 diabetes mellitus with other circulatory complications: Secondary | ICD-10-CM

## 2021-05-09 DIAGNOSIS — E1165 Type 2 diabetes mellitus with hyperglycemia: Secondary | ICD-10-CM

## 2021-05-09 MED ORDER — AMLODIPINE BESYLATE 5 MG PO TABS: 5.0000 mg | ORAL_TABLET | Freq: Every day | ORAL | 3 refills | Status: DC

## 2021-05-09 NOTE — Telephone Encounter (Signed)
Pt calling to get a prescription for [employer] or Dexcom. Please call pt with options. Pt contact [phone]

## 2021-05-10 MED ORDER — FREESTYLE LIBRE 2 READER DEVI
0 refills | Status: DC
Start: 2021-05-10 — End: 2021-08-15

## 2021-05-10 MED ORDER — FREESTYLE LIBRE 2 SENSOR MISC: 3 refills | Status: DC

## 2021-05-10 NOTE — Telephone Encounter (Signed)
Spoke with pt, insurance covers both Dexcom and [employer]. Pt has no preference which is prescribed. Rx sent to preferred pharmacy for [facility] and pt will compare how costly it will be.

## 2021-05-11 ENCOUNTER — Telehealth: Payer: Self-pay | Admitting: Internal Medicine

## 2021-05-11 NOTE — Telephone Encounter (Signed)
Pt called in regards to Semaglutide, 1 MG/DOSE, (OZEMPIC, 1 MG/DOSE,) 4 MG/3ML SOPN needing a PA. Please follow up with INS. [facility]. Pt contact [phone]

## 2021-05-12 ENCOUNTER — Other Ambulatory Visit (HOSPITAL_COMMUNITY): Payer: Self-pay

## 2021-06-02 ENCOUNTER — Telehealth: Payer: Self-pay | Admitting: Internal Medicine

## 2021-06-02 DIAGNOSIS — E1159 Type 2 diabetes mellitus with other circulatory complications: Secondary | ICD-10-CM

## 2021-06-02 DIAGNOSIS — E1165 Type 2 diabetes mellitus with hyperglycemia: Secondary | ICD-10-CM

## 2021-06-02 NOTE — Telephone Encounter (Signed)
Pt calling in to request a prescription for TRUE METRIC AIR BLUETOOTH from  [facility] FAX to [phone]  Ins friendly.   Pt contact [phone]

## 2021-06-03 ENCOUNTER — Telehealth: Payer: Self-pay | Admitting: Internal Medicine

## 2021-06-03 MED ORDER — TRUE METRIX AIR GLUCOSE METER W/DEVICE KIT: PACK | 0 refills | Status: DC

## 2021-06-03 MED ORDER — TRUE METRIX BLOOD GLUCOSE TEST VI STRP: ORAL_STRIP | 3 refills | Status: DC

## 2021-06-03 NOTE — Telephone Encounter (Signed)
This is actually good news!  We can now stop glipizide!  Please let us know if the sugars do not improve afterwards.

## 2021-06-03 NOTE — Telephone Encounter (Signed)
Called and advised pt to d/c Glipizide. Pt will follow up if needed.

## 2021-06-03 NOTE — Telephone Encounter (Signed)
Pt calling in to inform of low blood sugars. Had reading of 60s-80s before and after eating. Pt concerned. Please give her a call [phone]

## 2021-06-03 NOTE — Telephone Encounter (Signed)
Rx sent to [facility].

## 2021-06-03 NOTE — Telephone Encounter (Signed)
06/03/2021  8:30AM-143  2:45PM- 106  Patient states that she takes her medication as prescribed and that number have been dropping down to 63 and 64 morning and evening. Patient was unable to give me exact dates.  Please advise if you would like to change anything

## 2021-06-08 NOTE — Telephone Encounter (Signed)
Patient returned Bonnie Gordon's call and stated she has no concerns and is doing better.

## 2021-06-08 NOTE — Telephone Encounter (Signed)
Called and left a message for pt to let us know how her blood sugars have been since the d/c glipizide.

## 2021-06-15 NOTE — Telephone Encounter (Signed)
Pt calling requesting a Dexcom or any other CGM. But because she doesn't have to check her sugars 3x/day she was denied. Says her sugars run from 90-150 daily and she's concerned and would like a CGM to monitor them.   Also, wants to know if she should come in and do blood work?  Pt contact [phone]

## 2021-06-16 NOTE — Telephone Encounter (Signed)
Called and advised pt without insurance coverage she would have to pay out of pocket. She is not checking her blood sugar enough to qualify for a CGM with insurance.

## 2021-06-21 ENCOUNTER — Telehealth: Payer: Self-pay | Admitting: Internal Medicine

## 2021-06-21 NOTE — Telephone Encounter (Signed)
Clinical notes routed to [facility] via Epic to [phone].

## 2021-06-21 NOTE — Telephone Encounter (Signed)
Daylin from [facility] calling to request recent chart notes Dexcom prescription (Phys working order).   Please fax back to [phone]

## 2021-08-04 ENCOUNTER — Other Ambulatory Visit: Payer: Self-pay

## 2021-08-04 ENCOUNTER — Telehealth: Payer: Self-pay | Admitting: Cardiovascular Disease

## 2021-08-04 DIAGNOSIS — I2510 Atherosclerotic heart disease of native coronary artery without angina pectoris: Secondary | ICD-10-CM

## 2021-08-04 MED ORDER — ROSUVASTATIN CALCIUM 20 MG PO TABS: 20.0000 mg | ORAL_TABLET | Freq: Every day | ORAL | 1 refills | Status: DC

## 2021-08-04 NOTE — Telephone Encounter (Signed)
Per Verdon Cummins Cleaver's last note, patient was to d/c atorvastatin and resume rosuvastatin

## 2021-08-04 NOTE — Telephone Encounter (Signed)
°*  STAT* If patient is at the pharmacy, call can be transferred to refill team.   1. Which medications need to be refilled? (please list name of each medication and dose if known) atorvastatin (LIPITOR) 20 MG tablet  2. Which pharmacy/location (including street and city if local pharmacy) is medication to be sent to? [id number] [place] [place] - [address]  3. Do they need a 30 day or 90 day supply? 30 day   Patient has been out of medication for 2 weeks

## 2021-08-04 NOTE — Telephone Encounter (Signed)
Spoke with patient and sent in refill for rosuvastatin.

## 2021-08-15 ENCOUNTER — Encounter: Payer: Self-pay | Admitting: Internal Medicine

## 2021-08-15 ENCOUNTER — Ambulatory Visit (INDEPENDENT_AMBULATORY_CARE_PROVIDER_SITE_OTHER): Payer: Medicare HMO | Admitting: Internal Medicine

## 2021-08-15 ENCOUNTER — Other Ambulatory Visit: Payer: Self-pay

## 2021-08-15 VITALS — BP 128/82 | HR 89 | Ht 66.0 in | Wt 177.0 lb

## 2021-08-15 DIAGNOSIS — E1159 Type 2 diabetes mellitus with other circulatory complications: Secondary | ICD-10-CM

## 2021-08-15 DIAGNOSIS — E669 Obesity, unspecified: Secondary | ICD-10-CM

## 2021-08-15 DIAGNOSIS — E785 Hyperlipidemia, unspecified: Secondary | ICD-10-CM

## 2021-08-15 DIAGNOSIS — E1165 Type 2 diabetes mellitus with hyperglycemia: Secondary | ICD-10-CM | POA: Diagnosis not present

## 2021-08-15 LAB — BASIC METABOLIC PANEL
BUN: 11 mg/dL (ref 6–23)
CO2: 31 mEq/L (ref 19–32)
Calcium: 9.3 mg/dL (ref 8.4–10.5)
Chloride: 102 mEq/L (ref 96–112)
Creatinine, Ser: 0.63 mg/dL (ref 0.40–1.20)
GFR: 92.89 mL/min (ref >60.00)
Glucose, Bld: 124 mg/dL — ABNORMAL HIGH (ref 70–99)
Potassium: 3.9 mEq/L (ref 3.5–5.1)
Sodium: 141 mEq/L (ref 135–145)

## 2021-08-15 LAB — POCT GLYCOSYLATED HEMOGLOBIN (HGB A1C): Hemoglobin A1C: 6.6 % — AB (ref 4.0–5.6)

## 2021-08-15 NOTE — Patient Instructions (Addendum)
Please continue: - Farxiga 5 mg daily before breakfast - Ozempic 1 mg weekly   Please return in 4 months with your sugar log.  

## 2021-08-15 NOTE — Progress Notes (Signed)
Patient ID: Bonnie Gordon, female   DOB: May 12, 1956, 66 y.o.   MRN: [id number]  This visit occurred during the SARS-CoV-2 public health emergency.  Safety protocols were in place, including screening questions prior to the visit, additional usage of staff PPE, and extensive cleaning of exam room while observing appropriate contact time as indicated for disinfecting solutions.   HPI: Bonnie Gordon is a 66 y.o.-year-old female, presenting for follow-up for DM2, dx in 2012, insulin-independent, uncontrolled, with complications (CAD, PN). Last visit  4 months ago. She establish care with [facility].  Interim history: No blurry vision, nausea, chest pain. She has nocturia. She was in the emergency room with abd. pain 04/04/2021.  She was found to have kidney stones. 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  She saw cardiology and has another appointment coming up in 10/2021. Before last visit, she described diarrhea after every meal.  She tested positive for celiac disease.  She sees GI. She called with low blood sugars in the 60s to 80s in 05/2021.  We stopped glipizide. No more lows since then. She has some HAs- possibly stress-related as husband has memory loss.  Reviewed HbA1c levels: 04/13/2021 ([facility]) HbA1c 7.1%  Lab Results  Component Value Date   HGBA1C 7.2 (A) 04/12/2021   HGBA1C 6.8 (A) 12/07/2020   HGBA1C 6.9 (A) 08/09/2020   HGBA1C 7.2 (A) 04/06/2020   HGBA1C 8.3 (A) 11/25/2019   HGBA1C 7.1 (A) 07/29/2019   HGBA1C 7.7 (A) 04/01/2019   HGBA1C 7.5 (A) 07/15/2018   HGBA1C 8.1 08/24/2017   HGBA1C 6.8 04/25/2017   HGBA1C 6.5 01/18/2017   HGBA1C 6.2 10/18/2016   HGBA1C 7.5 07/20/2016   HGBA1C 9.5 04/20/2016   HGBA1C 8.1 (H) 08/30/2015   HGBA1C 7.1 (H) 02/25/2015   HGBA1C 7.0 (H) 08/26/2014   HGBA1C (H) 09/16/2009    6.5 (NOTE) The ADA recommends the following therapeutic goal for glycemic control related to Hgb A1c measurement:  Goal of therapy: <6.5 Hgb A1c  Reference: American Diabetes Association: Clinical Practice Recommendations 2010, Diabetes Care, 2010, 33: (Suppl  1).  11/2018: HbA1c 7.2% 01/14/2016: HbA1c 11.3%  Patient is on: - >> stopped 05/2021 - Farxiga 5 mg daily before breakfast - added 04/2021 - Victoza 1.8 mg at night >> moved to a.m. >> Ozempic 0.5 >> 1 mg weekly She was previously on: - Levemir up to 60 units at bedtime-stopped 11/2018 - Glipizide 5 mg before dinner >> stopped  - Glimepiride 4 mg before b'fast/ Glipizide XL 10 mg in am  - Tradjenta 5 mg in am  These were stopped when she started insulin. She was on Metformin >> diarrhea.  Pt checks her sugars 1x-2x a day (a CGM was not approved for her).: - am: 120-132 >> 94-139 >> 90s, 135-165 >> 100-123 - 2h after b'fast: 127 >> n/c >> 126 >> n/c >> 141 >> n/c - before lunch: 100, 120, 153 >> n/c >> 64 >> n/c - 2h after lunch: n/c >> 167 (cake) >> n/c - before dinner: 68, 113 >> 80-120 >> n/c >> 130 >> n/c - 2h after dinner: n/c >> 196 x1 >> n/c - bedtime: 76-140 >> up to 145, except 205, 219 >> up to 143 - nighttime: n/c Lowest sugar was 64 >> ...  76 >> 90s >> 100; it is unclear at which level she has hypoglycemia awareness. Highest sugar was 409 (?) >>... 141 >> 205, 219.  Glucometer:  Accu[employer]k [employer]  Meals: fish  - fried - once a month when she eats out; no red meat.  Salads with ranch dressing.  -No CKD, last BUN/creatinine: 04/13/2021 ([facility]): 10/0.74, GFR 90, glucose 114 Lab Results  Component Value Date   BUN 12 04/04/2021   BUN 8 08/05/2020   CREATININE 1.08 (H) 04/04/2021   CREATININE 0.85 08/05/2020  01/13/2016: 12/0.83, glucose 472  -+ HL; last set of lipids: 04/13/2021 ([facility]): 154/183/61/75 Lab Results  Component Value Date   CHOL 148 07/29/2019   HDL 56 07/29/2019   LDLCALC 65 07/29/2019   TRIG 160 (H) 07/29/2019   CHOLHDL 2.6 07/29/2019  01/13/2016: 147/218/73/69 On Crestor 20 mg daily.  - last eye exam was  in 11/2020: Reportedly no DR, + incipient stable cataract. Sees [facility].  -+ Occasional numbness and tingling in her feet.  She sees podiatry.  Previously on Neurontin but ran out. I suggested alpha-lipoic acid and B complex in the past but she did not start.  She was admitted with CP 05/2016 (found to have hypokalemia, mm cramps). On omeprazole -this is helping her GERD.  ROS: + See HPI  I reviewed pt's medications, allergies, PMH, social hx, family hx, and changes were documented in the history of present illness. Otherwise, unchanged from my initial visit note.  Past Medical History:  Diagnosis Date   Anxiety    Bipolar disorder ([place])    Chest pain    a. 01/2003 MV w/ ? ant-inflat ischemia-->Cath: LAD 20p, otw nonobs dzs-->Med Rx; b. 2009 Cath: LAD 30, LCX nl/nondominant, RCA 30, nl EF; c. 08/2014 MV: no ischemia/infarct.   Complication of anesthesia    pt. states she is difficult to wake up   Depression    Diabetes mellitus    Type II   Diabetic neuropathy (HCC)    Diverticulitis    GERD (gastroesophageal reflux disease)    Headache    History of bronchitis    Hypercholesteremia    Hypertension    Hypokalemia    PONV (postoperative nausea and vomiting)    Shortness of breath dyspnea    Sleep apnea    Vertigo    patient reported   Past Surgical History:  Procedure Laterality Date   ABDOMINAL HYSTERECTOMY     APPENDECTOMY     CARDIAC CATHETERIZATION  2009   normal cath   CARDIAC CATHETERIZATION  2004   normal cath   CHOLECYSTECTOMY     TONSILLECTOMY     TONSILLECTOMY/ADENOIDECTOMY/TURBINATE REDUCTION Bilateral 03/01/2015   Procedure: TONSILLECTOMY/TURBINATE REDUCTION;  Surgeon: Rozetta Nunnery, MD;  Location: [place];  Service: ENT;  Laterality: Bilateral;   UVULOPALATOPHARYNGOPLASTY Bilateral 03/01/2015   Procedure: UVULOPALATOPHARYNGOPLASTY (UPPP);  Surgeon: Rozetta Nunnery, MD;  Location: [facility] OR;  Service: ENT;  Laterality: Bilateral;   WISDOM TOOTH  EXTRACTION     Social History   Social History   Marital status: Divorced    Spouse name: N/A   Number of children: 2   Occupational History   Disability    Social History Main Topics   Smoking status: Former Smoker    Quit date: 08/07/1982   Smokeless tobacco: Never Used   Alcohol use Yes     Comment: Drinks during the weekends 1 beer or 1 drink of liquor per day    Drug use: No   Sexual activity: Yes    Partners: Male    Birth control/ protection: Surgical   Current Outpatient Medications on File Prior to Visit  Medication Sig  Dispense Refill   amLODipine (NORVASC) 5 MG tablet Take 1 tablet (5 mg total) by mouth daily. 90 tablet 3   aspirin EC 81 MG tablet Take 81 mg by mouth daily.     b complex vitamins capsule Take 1 capsule by mouth daily. (Patient not taking: Reported on 04/15/2021)     Blood Glucose Monitoring Suppl (TRUE METRIX AIR GLUCOSE METER) w/Device KIT Use as instructed to check blood sugar daly 1 kit 0   celecoxib (CELEBREX) 200 MG capsule Take 200 mg by mouth daily as needed.      Continuous Blood Gluc Receiver (FREESTYLE LIBRE 2 READER) DEVI Use as instructed to check blood sugar 4X daily 1 each 0   Continuous Blood Gluc Sensor (FREESTYLE LIBRE 2 SENSOR) MISC Use as instructed to check blood sugar 4X daily 6 each 3   dapagliflozin propanediol (FARXIGA) 5 MG TABS tablet Take 1 tablet (5 mg total) by mouth daily before breakfast. 90 tablet 3   diclofenac sodium (VOLTAREN) 1 % GEL Apply 2 g topically 4 (four) times daily as needed for pain.     glipiZIDE (GLUCOTROL XL) 5 MG 24 hr tablet Take 1 tablet (5 mg total) by mouth daily with breakfast. 180 tablet 3   glucose blood (TRUE METRIX BLOOD GLUCOSE TEST) test strip Use as instructed to check blood sugar 2X daily 200 each 3   hydrOXYzine (ATARAX/VISTARIL) 25 MG tablet TAKE 1 TABLET (25 MG TOTAL) BY MOUTH EVERY 6 (SIX) HOURS AS NEEDED FOR ITCHING. (Patient not taking: Reported on 04/15/2021) 20 tablet 0   metoprolol  succinate (TOPROL XL) 25 MG 24 hr tablet Take 1 tablet (25 mg total) by mouth daily. (Patient not taking: Reported on 04/15/2021) 90 tablet 3   Multiple Vitamin (MULTIVITAMIN) capsule Take 1 capsule by mouth daily.     omeprazole (PRILOSEC) 40 MG capsule Take 40 mg by mouth daily.     rosuvastatin (CRESTOR) 20 MG tablet Take 1 tablet (20 mg total) by mouth daily. 90 tablet 1   Semaglutide, 1 MG/DOSE, (OZEMPIC, 1 MG/DOSE,) 4 MG/3ML SOPN Inject 1 mg into the skin once a week. 9 mL 3   triamcinolone (KENALOG) 0.1 % APPLY 1 APPLICATION TOPICALLY 2 (TWO) TIMES DAILY AS NEEDED. USE DIRECTLY TO ITCHY AREAS, AVOID FACE. 30 g 0   valACYclovir (VALTREX) 500 MG tablet Take 500 mg by mouth See admin instructions. Takes Mon,Tues,Wed bid     No current facility-administered medications on file prior to visit.   Allergies  Allergen Reactions   Other Other (See Comments)    No Blood Products, personal preference   Latex Itching and Rash   Family History  Problem Relation Age of Onset   Diabetes Mother    Gout Mother    Hypertension Mother    Arthritis Mother    Heart disease Father    Heart failure Sister    Arthritis Sister    Heart disease Maternal Grandmother    Diabetes Maternal Grandmother    Heart disease Paternal Grandmother    Diabetes Paternal Grandmother    Diabetes Sister    Bursitis Sister    Crohn's disease Sister    PE: BP 128/82 (BP Location: Right Arm, Patient Position: Sitting, Cuff Size: Normal)    Pulse 89    Ht _0  (1.676 m)    Wt 177 lb (80.3 kg)    SpO2 98%    BMI 28.57 kg/m  Wt Readings from Last 3 Encounters:  08/15/21 177 lb (80.3  kg)  04/15/21 181 lb 3.2 oz (82.2 kg)  04/12/21 181 lb 12.8 oz (82.5 kg)   Constitutional: overweight, in NAD Eyes: PERRLA, EOMI, no exophthalmos ENT: moist mucous membranes, no thyromegaly, no cervical lymphadenopathy Cardiovascular: RRR, No MRG Respiratory: CTA B Musculoskeletal: no deformities, strength intact in all 4 Skin: moist,  warm, no rashes Neurological: no tremor with outstretched hands, DTR normal in all 4  ASSESSMENT: 1. DM2, insulin-independent now, uncontrolled, with complications - CAD - PN  2. HL  3.  Obesity class I  PLAN:  1. Patient with longstanding, uncontrolled, type 2 diabetes, improving after changing GLP-1 receptor agonist formulations from Victoza to [place].  HbA1c decreased to 6.8% in 12/2020.  However, at last visit, this increased to 7.2%.  At that time, we added [place].  Afterwards, sugars improved to the point of mild lows, so we were able to stop glipizide (06/03/2021).  At last visit, sugars are higher than before in the morning and she was not checking later in the day.  I advised her to rotate check times. -At today's visit, all her blood sugars are at goal, much improved.  She has no more low blood sugars after stopping glipizide.  She mentions that she has a decreased appetite, but otherwise tolerates Ozempic well.  She has nocturia but this has not changed much.  At this visit I will check a BMP as we started Farxiga at last visit. - I suggested to:  Patient Instructions  Please continue: - Farxiga 5 mg daily before breakfast - Ozempic 1 mg weekly   Please return in 4 months with your sugar log.   - we checked her HbA1c: 7.2% (higher)  - advised to check sugars at different times of the day - 1x a day, rotating check times - advised for yearly eye exams >> she is UTD - return to clinic in 4 months  2. HL -Reviewed latest lipid panel from 04/2021: Fractions at goal with the exception of a high triglyceride level: 154/183/61/75 -Continues Crestor 20 mg daily without side effects  3.  Obesity class I -continue SGLT 2 inhibitor and GLP-1 receptor agonist which should also help with weight loss.  [place] Glade was added at last visit. -We were also able to stop glipizide, which may have contributed to weight gain before -She lost 5 pounds before last visit. Lost another 5 lbs since  last OV.  Component     Latest Ref Rng & Units 08/15/2021  Sodium     135 - 145 mEq/L 141  Potassium     3.5 - 5.1 mEq/L 3.9  Chloride     96 - 112 mEq/L 102  CO2     19 - 32 mEq/L 31  Glucose     70 - 99 mg/dL 124 (H)  BUN     6 - 23 mg/dL 11  Creatinine     0.40 - 1.20 mg/dL 0.63  GFR     >60.00 mL/min 92.89  Calcium     8.4 - 10.5 mg/dL 9.3    Philemon Kingdom, MD PhD [facility] Endocrinology

## 2021-10-20 ENCOUNTER — Ambulatory Visit: Payer: Medicare HMO | Admitting: Cardiovascular Disease

## 2021-12-06 LAB — HM DIABETES EYE EXAM

## 2021-12-13 ENCOUNTER — Ambulatory Visit: Payer: Medicare HMO | Admitting: Internal Medicine

## 2021-12-13 NOTE — Progress Notes (Deleted)
Patient ID: Bonnie Gordon, female   DOB: 1955-10-12, 66 y.o.   MRN: [id number]  This visit occurred during the SARS-CoV-2 public health emergency.  Safety protocols were in place, including screening questions prior to the visit, additional usage of staff PPE, and extensive cleaning of exam room while observing appropriate contact time as indicated for disinfecting solutions.   HPI: Bonnie Gordon is a 66 y.o.-year-old female, presenting for follow-up for DM2, dx in 2012, insulin-independent, uncontrolled, with complications (CAD, PN). Last visit  4 months ago. She establish care with [facility].  Interim history: No blurry vision, nausea, chest pain. She has nocturia. She continues to have HAs- possibly stress-related as husband has memory loss.  Reviewed HbA1c levels: Lab Results  Component Value Date   HGBA1C 6.6 (A) 08/15/2021   HGBA1C 7.2 (A) 04/12/2021   HGBA1C 6.8 (A) 12/07/2020   HGBA1C 6.9 (A) 08/09/2020   HGBA1C 7.2 (A) 04/06/2020   HGBA1C 8.3 (A) 11/25/2019   HGBA1C 7.1 (A) 07/29/2019   HGBA1C 7.7 (A) 04/01/2019   HGBA1C 7.5 (A) 07/15/2018   HGBA1C 8.1 08/24/2017   HGBA1C 6.8 04/25/2017   HGBA1C 6.5 01/18/2017   HGBA1C 6.2 10/18/2016   HGBA1C 7.5 07/20/2016   HGBA1C 9.5 04/20/2016   HGBA1C 8.1 (H) 08/30/2015   HGBA1C 7.1 (H) 02/25/2015   HGBA1C 7.0 (H) 08/26/2014   HGBA1C (H) 09/16/2009    6.5 (NOTE) The ADA recommends the following therapeutic goal for glycemic control related to Hgb A1c measurement: Goal of therapy: <6.5 Hgb A1c  Reference: American Diabetes Association: Clinical Practice Recommendations 2010, Diabetes Care, 2010, 33: (Suppl  1).  04/13/2021 ([facility]) HbA1c 7.1%  11/2018: HbA1c 7.2% 01/14/2016: HbA1c 11.3%  Patient is on: - >> stopped 05/2021 - Farxiga 5 mg daily before breakfast - added 04/2021 - Victoza 1.8 mg at night >> moved to a.m. >> Ozempic 0.5 >> 1 mg weekly She was previously on: - Levemir up to 60 units at  bedtime-stopped 11/2018 - Glipizide 5 mg before dinner >> stopped  - Glimepiride 4 mg before b'fast/ Glipizide XL 10 mg in am  - Tradjenta 5 mg in am  These were stopped when she started insulin. She was on Metformin >> diarrhea.  Pt checks her sugars 1x-2x a day (a CGM was not approved for her).: - am: 120-132 >> 94-139 >> 90s, 135-165 >> 100-123 - 2h after b'fast: 127 >> n/c >> 126 >> n/c >> 141 >> n/c - before lunch: 100, 120, 153 >> n/c >> 64 >> n/c - 2h after lunch: n/c >> 167 (cake) >> n/c - before dinner: 68, 113 >> 80-120 >> n/c >> 130 >> n/c - 2h after dinner: n/c >> 196 x1 >> n/c - bedtime: 76-140 >> up to 145, except 205, 219 >> up to 143 - nighttime: n/c Lowest sugar was 64 >> ...  76 >> 90s >> 100; it is unclear at which level she has hypoglycemia awareness. Highest sugar was 409 (?) >>... 141 >> 205, 219.  Glucometer: AccuChek [employer]  Meals: fish  - fried - once a month when she eats out; no red meat.  Salads with ranch dressing.  -No CKD, last BUN/creatinine: 04/13/2021 ([facility]): 10/0.74, GFR 90, glucose 114 Lab Results  Component Value Date   BUN 11 08/15/2021   BUN 12 04/04/2021   CREATININE 0.63 08/15/2021   CREATININE 1.08 (H) 04/04/2021  01/13/2016: 12/0.83, glucose 472  -+ HL; last set of lipids: 04/13/2021 ([facility]): 154/183/61/75 Lab Results  Component Value Date   CHOL 148 07/29/2019   HDL 56 07/29/2019   LDLCALC 65 07/29/2019   TRIG 160 (H) 07/29/2019   CHOLHDL 2.6 07/29/2019  01/13/2016: 147/218/73/69 On Crestor 20 mg daily.  - last eye exam was in 11/2020: Reportedly no DR, + incipient stable cataract. Sees [facility].  -+ Occasional numbness and tingling in her feet.  She sees podiatry.  Previously on Neurontin but ran out. I suggested alpha-lipoic acid and B complex in the past but she did not start.  She was admitted with CP 05/2016 (found to have hypokalemia, mm cramps). On omeprazole -this is helping her GERD. She was in the emergency room  with abd. pain 04/04/2021.  She was found to have kidney stones. 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  She sees cardiology. In 2022, she described diarrhea after every meal.  She tested positive for celiac disease.  She sees GI.  ROS: + See HPI  I reviewed pt's medications, allergies, PMH, social hx, family hx, and changes were documented in the history of present illness. Otherwise, unchanged from my initial visit note.  Past Medical History:  Diagnosis Date   Anxiety    Bipolar disorder ([place])    Chest pain    a. 01/2003 MV w/ ? ant-inflat ischemia-->Cath: LAD 20p, otw nonobs dzs-->Med Rx; b. 2009 Cath: LAD 30, LCX nl/nondominant, RCA 30, nl EF; c. 08/2014 MV: no ischemia/infarct.   Complication of anesthesia    pt. states she is difficult to wake up   Depression    Diabetes mellitus    Type II   Diabetic neuropathy (HCC)    Diverticulitis    GERD (gastroesophageal reflux disease)    Headache    History of bronchitis    Hypercholesteremia    Hypertension    Hypokalemia    PONV (postoperative nausea and vomiting)    Shortness of breath dyspnea    Sleep apnea    Vertigo    patient reported   Past Surgical History:  Procedure Laterality Date   ABDOMINAL HYSTERECTOMY     APPENDECTOMY     CARDIAC CATHETERIZATION  2009   normal cath   CARDIAC CATHETERIZATION  2004   normal cath   CHOLECYSTECTOMY     TONSILLECTOMY     TONSILLECTOMY/ADENOIDECTOMY/TURBINATE REDUCTION Bilateral 03/01/2015   Procedure: TONSILLECTOMY/TURBINATE REDUCTION;  Surgeon: Rozetta Nunnery, MD;  Location: [facility];  Service: ENT;  Laterality: Bilateral;   UVULOPALATOPHARYNGOPLASTY Bilateral 03/01/2015   Procedure: UVULOPALATOPHARYNGOPLASTY (UPPP);  Surgeon: Rozetta Nunnery, MD;  Location: [facility] OR;  Service: ENT;  Laterality: Bilateral;   WISDOM TOOTH EXTRACTION     Social History   Social History   Marital status: Divorced    Spouse name: N/A   Number of  children: 2   Occupational History   Disability    Social History Main Topics   Smoking status: Former Smoker    Quit date: 08/07/1982   Smokeless tobacco: Never Used   Alcohol use Yes     Comment: Drinks during the weekends 1 beer or 1 drink of liquor per day    Drug use: No   Sexual activity: Yes    Partners: Male    Birth control/ protection: Surgical   Current Outpatient Medications on File Prior to Visit  Medication Sig Dispense Refill   amLODipine (NORVASC) 5 MG tablet Take 1 tablet (5 mg total) by mouth daily. 90 tablet 3   aspirin  EC 81 MG tablet Take 81 mg by mouth daily.     Blood Glucose Monitoring Suppl (TRUE METRIX AIR GLUCOSE METER) w/Device KIT Use as instructed to check blood sugar daly 1 kit 0   celecoxib (CELEBREX) 200 MG capsule Take 200 mg by mouth daily as needed.      dapagliflozin propanediol (FARXIGA) 5 MG TABS tablet Take 1 tablet (5 mg total) by mouth daily before breakfast. 90 tablet 3   diclofenac sodium (VOLTAREN) 1 % GEL Apply 2 g topically 4 (four) times daily as needed for pain.     glucose blood (TRUE METRIX BLOOD GLUCOSE TEST) test strip Use as instructed to check blood sugar 2X daily 200 each 3   metoprolol succinate (TOPROL XL) 25 MG 24 hr tablet Take 1 tablet (25 mg total) by mouth daily. 90 tablet 3   Multiple Vitamin (MULTIVITAMIN) capsule Take 1 capsule by mouth daily. (Patient not taking: Reported on 08/15/2021)     omeprazole (PRILOSEC) 40 MG capsule Take 40 mg by mouth daily.     rosuvastatin (CRESTOR) 20 MG tablet Take 1 tablet (20 mg total) by mouth daily. 90 tablet 1   Semaglutide, 1 MG/DOSE, (OZEMPIC, 1 MG/DOSE,) 4 MG/3ML SOPN Inject 1 mg into the skin once a week. 9 mL 3   valACYclovir (VALTREX) 500 MG tablet Take 500 mg by mouth See admin instructions. Takes Mon,Tues,Wed bid     No current facility-administered medications on file prior to visit.   Allergies  Allergen Reactions   Other Other (See Comments)    No Blood Products,  personal preference   Latex Itching and Rash   Family History  Problem Relation Age of Onset   Diabetes Mother    Gout Mother    Hypertension Mother    Arthritis Mother    Heart disease Father    Heart failure Sister    Arthritis Sister    Heart disease Maternal Grandmother    Diabetes Maternal Grandmother    Heart disease Paternal Grandmother    Diabetes Paternal Grandmother    Diabetes Sister    Bursitis Sister    Crohn's disease Sister    PE: There were no vitals taken for this visit. Wt Readings from Last 3 Encounters:  08/15/21 177 lb (80.3 kg)  04/15/21 181 lb 3.2 oz (82.2 kg)  04/12/21 181 lb 12.8 oz (82.5 kg)   Constitutional: overweight, in NAD Eyes: PERRLA, EOMI, no exophthalmos ENT: moist mucous membranes, no thyromegaly, no cervical lymphadenopathy Cardiovascular: RRR, No MRG Respiratory: CTA B Musculoskeletal: no deformities, strength intact in all 4 Skin: moist, warm, no rashes Neurological: no tremor with outstretched hands, DTR normal in all 4  ASSESSMENT: 1. DM2, insulin-independent now, uncontrolled, with complications - CAD - PN  2. HL  3.  Obesity class I  PLAN:  1. Patient with longstanding, uncontrolled, type 2 diabetes, improving after changing from Victoza to [place] and then after adding [place].  She was able to come off the sulfonylurea.  HbA1c decreased to 6.6% at last visit. -At last visit, blood sugars were at goal, much improved.  She had no more low blood sugars after stopping glipizide.  She had decreased appetite but otherwise tolerating Ozempic well.  She continues to have nocturia, which has not changed with addition of Farxiga.  - I suggested to:  Patient Instructions  Please continue: - Farxiga 5 mg daily before breakfast - Ozempic 1 mg weekly   Please return in 4 months with your sugar log.   -  we checked her HbA1c: 7%  - advised to check sugars at different times of the day - 1x a day, rotating check times - advised  for yearly eye exams >> she is UTD - return to clinic in 4 months  2. HL -Reviewed latest lipid panel from 04/2021: Fractions at goal with the exception of a high triglyceride level: 154/183/61/75 -She is on Crestor 10 mg daily without side effects  3.  Obesity class I -continue SGLT 2 inhibitor and GLP-1 receptor agonist which should also help with weight loss -She is currently off glipizide, which may have contributed to weight gain before -She lost 10 pounds before the last 2 visits combined   Philemon Kingdom, MD PhD [facility] Endocrinology

## 2022-01-04 ENCOUNTER — Ambulatory Visit (INDEPENDENT_AMBULATORY_CARE_PROVIDER_SITE_OTHER): Payer: Medicare HMO | Admitting: Student

## 2022-01-04 ENCOUNTER — Encounter: Payer: Self-pay | Admitting: Student

## 2022-01-04 VITALS — BP 132/70 | HR 84 | Ht 66.0 in | Wt 173.0 lb

## 2022-01-04 DIAGNOSIS — E1159 Type 2 diabetes mellitus with other circulatory complications: Secondary | ICD-10-CM | POA: Diagnosis not present

## 2022-01-04 DIAGNOSIS — R4589 Other symptoms and signs involving emotional state: Secondary | ICD-10-CM | POA: Diagnosis not present

## 2022-01-04 DIAGNOSIS — H00012 Hordeolum externum right lower eyelid: Secondary | ICD-10-CM

## 2022-01-04 DIAGNOSIS — E1165 Type 2 diabetes mellitus with hyperglycemia: Secondary | ICD-10-CM | POA: Diagnosis not present

## 2022-01-04 NOTE — Progress Notes (Unsigned)
Subjective:  Patient ID: Bonnie Gordon, female    DOB: September 30, 1955, 66 y.o.   MRN: [id number]  CC: New Patient  HPI:  Bonnie Gordon is a very pleasant 66 y.o. female who presents today to establish care.  Patient also presents to discuss difficulties in her personal life. Her husband was diagnosed with dementia one year ago and she has been struggling with this and requests information on speaking with a therapist. Denies interest in medication at this time. PHQ-9 score of 4. Denies SI/HI.  Also would like to discuss difficulty swallowing but is willing to table discussion for later visit.   PMHx: Past Medical History:  Diagnosis Date   Anxiety    Bipolar disorder (HCC)    Chest pain    a. 01/2003 MV w/ ? ant-inflat ischemia-->Cath: LAD 20p, otw nonobs dzs-->Med Rx; b. 2009 Cath: LAD 30, LCX nl/nondominant, RCA 30, nl EF; c. 08/2014 MV: no ischemia/infarct.   Complication of anesthesia    pt. states she is difficult to wake up   Depression    Diabetes mellitus    Type II   Diabetic neuropathy (HCC)    Diverticulitis    GERD (gastroesophageal reflux disease)    Headache    History of bronchitis    Hypercholesteremia    Hypertension    Hypokalemia    PONV (postoperative nausea and vomiting)    Shortness of breath dyspnea    Sleep apnea    Vertigo    patient reported    Surgical Hx: Past Surgical History:  Procedure Laterality Date   ABDOMINAL HYSTERECTOMY  1996   APPENDECTOMY  1990   CARDIAC CATHETERIZATION  2009   normal cath   CARDIAC CATHETERIZATION  2004   normal cath   CHOLECYSTECTOMY  1994   TONSILLECTOMY     TONSILLECTOMY/ADENOIDECTOMY/TURBINATE REDUCTION Bilateral 03/01/2015   Procedure: TONSILLECTOMY/TURBINATE REDUCTION;  Surgeon: Drema Halon, MD;  Location: [facility] OR;  Service: ENT;  Laterality: Bilateral;   UVULOPALATOPHARYNGOPLASTY Bilateral 03/01/2015   Procedure: UVULOPALATOPHARYNGOPLASTY (UPPP);  Surgeon: Drema Halon, MD;   Location: [facility] OR;  Service: ENT;  Laterality: Bilateral;   WISDOM TOOTH EXTRACTION      Family Hx: Family History  Problem Relation Age of Onset   Diabetes Mother    Gout Mother    Hypertension Mother    Arthritis Mother    Heart disease Father    Heart failure Sister    Arthritis Sister    Heart disease Maternal Grandmother    Diabetes Maternal Grandmother    Heart disease Paternal Grandmother    Diabetes Paternal Grandmother    Diabetes Sister    Bursitis Sister    Crohn's disease Sister     Social Hx: Who lives at home: husband, stepdaughter, dog 01/05/2022  Who would speak for you about health care matters: both of her daughters 01/05/2022  Transportation: owns a car 01/05/2022 Religious / Personal Beliefs: N/A 01/05/2022 Interests / Fun: playing cards, travel 01/05/2022  Medications:  Smoking status reviewed  Objective:  BP 132/70   Pulse 84   Ht 5\' 6"  (1.676 m)   Wt 173 lb (78.5 kg)   SpO2 95%   BMI 27.92 kg/m  Vitals and nursing note reviewed  General: NAD, pleasant, able to participate in exam HEENT: stye of right lower eyelid Cardiac: RRR, S1 S2 present. normal heart sounds, no murmurs. Respiratory: CTAB, normal effort, No wheezes, rales or rhonchi Psych: Normal affect and mood     Assessment & Plan:  Non-suicidal depressed mood Empathized with patient's personal situation of having husband with dementia. Provided resources for counseling. No medications at this time.   Stye Warm compresses and washing daily with Johnson's baby shampoo.  Poorly controlled type 2 diabetes mellitus with circulatory disorder (HCC) A1c 6.6. Managed by Endocrinology. No changes to medication today.  Return in about 2 weeks (around 01/18/2022) for difficulty swallowing. [name], DO 01/05/2022, 1:51 PM PGY-1, [facility]

## 2022-01-04 NOTE — Patient Instructions (Addendum)
It was great to see you today! Thank you for choosing [facility] for your primary care. Bonnie Gordon was seen for establishing care and difficulty in persona life.  Today we addressed: Welcome to our clinic!  This is a residency clinic which means that while I am your primary doctor and will complete all medication requests provided by our clinic and any paperwork regarding medical needs you have, if I am working in the hospital or elsewhere, another resident may see you.  However, you will have priority on my schedule. I am sorry that you have had some personal difficulties recently.  For information on therapists, please go to [email or web address]. You can also contact your insurance company to find an in-network therapist.  Please sign a records release upfront before leaving today for the GI office that did your colonoscopy.  In my system, I could only see a recent colonoscopy in 2014.  If you haven't already, sign up for My Chart to have easy access to your labs results, and communication with your primary care physician.  You should return to our clinic Return in about 2 weeks (around 01/18/2022).  I recommend that you always bring your medications to each appointment as this makes it easy to ensure you are on the correct medications and helps [place] not miss refills when you need them.  Please arrive 15 minutes before your appointment to ensure smooth check in process.  We appreciate your efforts in making this happen.  Please call the clinic at [phone] if your symptoms worsen or you have any concerns.  Thank you for allowing me to participate in your care, Bonnie Mattocks, DO 01/04/2022, 9:24 AM PGY-1, [facility]

## 2022-01-04 NOTE — Assessment & Plan Note (Signed)
Empathized with patient's personal situation of having husband with dementia. Provided resources for counseling. No medications at this time.

## 2022-01-05 ENCOUNTER — Encounter: Payer: Self-pay | Admitting: Student

## 2022-01-05 DIAGNOSIS — H00019 Hordeolum externum unspecified eye, unspecified eyelid: Secondary | ICD-10-CM | POA: Insufficient documentation

## 2022-01-05 NOTE — Assessment & Plan Note (Signed)
A1c 6.6. Managed by Endocrinology. No changes to medication today.

## 2022-01-05 NOTE — Assessment & Plan Note (Signed)
Warm compresses and washing daily with Johnson's baby shampoo.

## 2022-01-10 ENCOUNTER — Encounter: Payer: Self-pay | Admitting: *Deleted

## 2022-01-13 ENCOUNTER — Ambulatory Visit (HOSPITAL_COMMUNITY)
Admission: EM | Admit: 2022-01-13 | Discharge: 2022-01-13 | Disposition: A | Discharge status: Home / Self Care | Payer: Medicare HMO | Attending: Internal Medicine | Admitting: Internal Medicine

## 2022-01-13 ENCOUNTER — Encounter (HOSPITAL_COMMUNITY): Payer: Self-pay | Admitting: Emergency Medicine

## 2022-01-13 DIAGNOSIS — H00012 Hordeolum externum right lower eyelid: Secondary | ICD-10-CM | POA: Diagnosis not present

## 2022-01-13 MED ORDER — GENTAMICIN SULFATE 0.3 % OP SOLN: 2.0000 [drp] | OPHTHALMIC | 0 refills | Status: DC

## 2022-01-13 MED ORDER — OLOPATADINE HCL 0.1 % OP SOLN: 1.0000 [drp] | Freq: Two times a day (BID) | OPHTHALMIC | 12 refills | Status: DC

## 2022-01-13 NOTE — ED Provider Notes (Signed)
[facility]    CSN: [id number] Arrival date & time: 01/13/22  1946      History   Chief Complaint Chief Complaint  Patient presents with   Facial Swelling    HPI Bonnie Gordon is a 66 y.o. female.   Patient presents to urgent care for evaluation of her eye lesion that started approximately 2 weeks ago and has gotten worse.  She states that she has never had a stye in the past.  Stye is located to patient's right eye to the medial lower eyelid.  Area is red and very itchy.  Patient denies eye pain, fever, chills, headache, and eye drainage.  Vision is normal.     Past Medical History:  Diagnosis Date   Anxiety    Bipolar disorder ([place])    Chest pain    a. 01/2003 MV w/ ? ant-inflat ischemia-->Cath: LAD 20p, otw nonobs dzs-->Med Rx; b. 2009 Cath: LAD 30, LCX nl/nondominant, RCA 30, nl EF; c. 08/2014 MV: no ischemia/infarct.   Complication of anesthesia    pt. states she is difficult to wake up   Depression    Diabetes mellitus    Type II   Diabetic neuropathy (HCC)    Diverticulitis    GERD (gastroesophageal reflux disease)    Headache    History of bronchitis    Hypercholesteremia    Hypertension    Hypokalemia    PONV (postoperative nausea and vomiting)    Shortness of breath dyspnea    Sleep apnea    Vertigo    patient reported    Patient Active Problem List   Diagnosis Date Noted   Stye 01/05/2022   Non-suicidal depressed mood 01/04/2022   Obesity, Class I, BMI 30-34.9 07/15/2018   Nonintractable headache 03/27/2018   Poorly controlled type 2 diabetes mellitus with circulatory disorder ([place]) 03/01/2016   RBBB 03/02/2015   Obstructive apnea 03/01/2015   CAD (coronary artery disease) 07/08/2014   Essential hypertension 07/08/2014   Hyperlipidemia LDL goal <70 07/08/2014   OSA on CPAP 07/08/2014    Past Surgical History:  Procedure Laterality Date   [place]  2009    normal cath   CARDIAC CATHETERIZATION  2004   normal cath   CHOLECYSTECTOMY  1994   TONSILLECTOMY     TONSILLECTOMY/ADENOIDECTOMY/TURBINATE REDUCTION Bilateral 03/01/2015   Procedure: TONSILLECTOMY/TURBINATE REDUCTION;  Surgeon: Rozetta Nunnery, MD;  Location: [place];  Service: ENT;  Laterality: Bilateral;   UVULOPALATOPHARYNGOPLASTY Bilateral 03/01/2015   Procedure: UVULOPALATOPHARYNGOPLASTY (UPPP);  Surgeon: Rozetta Nunnery, MD;  Location: [place];  Service: ENT;  Laterality: Bilateral;   WISDOM TOOTH EXTRACTION      OB History   No obstetric history on file.      Home Medications    Prior to Admission medications   Medication Sig Start Date End Date Taking? Authorizing Provider  amLODipine (NORVASC) 5 MG tablet Take 1 tablet (5 mg total) by mouth daily. 05/09/21   Troy Sine, MD  aspirin EC 81 MG tablet Take 81 mg by mouth daily.    [provider]  atorvastatin (LIPITOR) 20 MG tablet Take 20 mg by mouth daily. 11/01/21   [provider]  Blood Glucose Monitoring Suppl (TRUE METRIX AIR GLUCOSE METER) w/Device KIT Use as instructed to check blood sugar daly 06/03/21   Philemon Kingdom, MD  celecoxib (CELEBREX) 200 MG capsule Take 200 mg by mouth daily as needed.  [provider]  dapagliflozin propanediol (FARXIGA) 5 MG TABS tablet Take 1 tablet (5 mg total) by mouth daily before breakfast. 04/12/21   Philemon Kingdom, MD  glucose blood (TRUE METRIX BLOOD GLUCOSE TEST) test strip Use as instructed to check blood sugar 2X daily 06/03/21   Philemon Kingdom, MD  Multiple Vitamin (MULTIVITAMIN) capsule Take 1 capsule by mouth daily. Patient not taking: Reported on 08/15/2021    [provider]  omeprazole (PRILOSEC) 40 MG capsule Take 40 mg by mouth daily. 08/04/20   [provider]  Semaglutide, 1 MG/DOSE, (OZEMPIC, 1 MG/DOSE,) 4 MG/3ML SOPN Inject 1 mg into the skin once a week. 04/12/21   Philemon Kingdom, MD  valACYclovir  (VALTREX) 500 MG tablet Take 500 mg by mouth See admin instructions. Takes Mon,Tues,Wed bid 05/24/16   [provider]    Family History Family History  Problem Relation Age of Onset   Diabetes Mother    Gout Mother    Hypertension Mother    Arthritis Mother    Heart disease Father    Heart failure Sister    Arthritis Sister    Heart disease Maternal Grandmother    Diabetes Maternal Grandmother    Heart disease Paternal Grandmother    Diabetes Paternal Grandmother    Diabetes Sister    Bursitis Sister    Crohn's disease Sister     Social History Social History   Tobacco Use   Smoking status: Former    Types: Cigarettes    Quit date: 08/07/1982    Years since quitting: 39.4   Smokeless tobacco: Never  Vaping Use   Vaping Use: Never used  Substance Use Topics   Alcohol use: Yes    Comment: occasional / social   Drug use: No     Allergies   Other and Latex   Review of Systems Review of Systems   Physical Exam Triage Vital Signs ED Triage Vitals  Enc Vitals Group     BP 01/13/22 1953 (!) 142/82     Pulse Rate 01/13/22 1953 85     Resp 01/13/22 1953 18     Temp 01/13/22 1953 98.2 F (36.8 C)     Temp Source 01/13/22 1953 Oral     SpO2 01/13/22 1953 97 %     Weight --      Height --      Head Circumference --      Peak Flow --      Pain Score 01/13/22 1952 0     Pain Loc --      Pain Edu? --      Excl. in [place]? --    No data found.  Updated Vital Signs BP (!) 142/82 (BP Location: Right Arm)   Pulse 85   Temp 98.2 F (36.8 C) (Oral)   Resp 18   SpO2 97%   Visual Acuity Right Eye Distance: 20/25 Left Eye Distance: 20/20 Bilateral Distance: 20/20  Right Eye Near:   Left Eye Near:    Bilateral Near:     Physical Exam         UC Treatments / Results  Labs (all labs ordered are listed, but only abnormal results are displayed) Labs Reviewed - No data to display  EKG   Radiology No results found.  Procedures Procedures  (including critical care time)  Medications Ordered in UC Medications - No data to display  Initial Impression / Assessment and Plan / UC Course  I have reviewed the  triage vital signs and the nursing notes.  Pertinent labs & imaging results that were available during my care of the patient were reviewed by me and considered in my medical decision making (see chart for details).     *** Final Clinical Impressions(s) / UC Diagnoses   Final diagnoses:  None   Discharge Instructions   None    ED Prescriptions   None    PDMP not reviewed this encounter.

## 2022-01-13 NOTE — ED Triage Notes (Signed)
Pt reports ben having right eye swelling for several days. Tried OTC medications pharmacy recommended without relief. Itching at time. Reports feels like something in it.

## 2022-01-13 NOTE — Discharge Instructions (Signed)
Use 2 drops of gentamicin eyedrops into the right eye every 4 hours for the next 7 days.  Use olopatadine eyedrops for itch to right eye as needed twice daily.  If you develop any new or worsening symptoms or do not improve in the next 2 to 3 days, please return.  If your symptoms are severe, please go to the emergency room.  Follow-up with your primary care provider for further evaluation and management of your symptoms as well as ongoing wellness visits.  I hope you feel better!

## 2022-01-31 ENCOUNTER — Ambulatory Visit (INDEPENDENT_AMBULATORY_CARE_PROVIDER_SITE_OTHER): Payer: Medicare HMO | Admitting: Cardiovascular Disease

## 2022-01-31 ENCOUNTER — Encounter: Payer: Self-pay | Admitting: Cardiovascular Disease

## 2022-01-31 VITALS — BP 130/90 | HR 77 | Ht 66.0 in | Wt 172.0 lb

## 2022-01-31 DIAGNOSIS — R0789 Other chest pain: Secondary | ICD-10-CM

## 2022-01-31 DIAGNOSIS — I2510 Atherosclerotic heart disease of native coronary artery without angina pectoris: Secondary | ICD-10-CM

## 2022-01-31 DIAGNOSIS — R0602 Shortness of breath: Secondary | ICD-10-CM

## 2022-01-31 DIAGNOSIS — G4733 Obstructive sleep apnea (adult) (pediatric): Secondary | ICD-10-CM | POA: Diagnosis not present

## 2022-01-31 DIAGNOSIS — I4510 Unspecified right bundle-branch block: Secondary | ICD-10-CM

## 2022-01-31 DIAGNOSIS — E785 Hyperlipidemia, unspecified: Secondary | ICD-10-CM

## 2022-01-31 NOTE — Progress Notes (Signed)
Cardiology Office Note    Date:  02/05/2022   ID:  Bonnie Gordon, DOB Jun 08, 1956, MRN [id number]  PCP:  Wells Guiles, DO  Cardiologist:  Shelva Majestic, MD    31 month F/U evaluation with me.  History of Present Illness:  Bonnie Gordon is a 66 y.o. female who I initially had seen in 2004 as Bonnie Gordon after she had experienced chest pressure and was evaluated at [facility] emergency room.  A nuclear perfusion study raised the possibility of anterior to inferolateral wall ischemia.  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  She was treated aggressively with statin therapy in attempt to induce plaque or aggression.  She had a second heart catheterization which was done by Dr. Gwenlyn Found in 2009 which showed 30% LAD and RCA stenoses with a normal nondominant circumflex and with continued normal LV function.   She developed diabetes mellitus and wasmedication for hypertension as well as GERD, hyperlipidemia, asthma and peripheral neuropathy.  She also was diagnosed with sleep apnea and is using CPAP therapy.   She had developed episodes of chest discomfort.  Oftentimes these are sharp and short-lived.  A 2-D echo Doppler study on 08/12/2014 showed normal systolic function without regional wall motion abnormalities or abnormal valvular architecture.  A nuclear perfusion study revealed an ejection fraction of 58% with normal perfusion and function.  There were no ECG changes on the study.  She underwent laboratory which demonstrated a hemoglobin of 13.9, hematocrit 41.3.  Electrolytes were normal, although glucose was mildly elevated at 1:15.  She had normal liver function studies with the exception of minimal alkaline phosphatase elevation at 136.  Her lipids were excellent with a total cholesterol 151,  triglycerides 97, HDL 52, LDL 80.  Hemoglobin A1c was increased at 7.0.  She presents now for follow-up evaluation.   When I  saw her in January 2017, she had stopped taking Crestor. She was inactive and not exercising.  He had noted some mild leg swelling.  She was taking amlodipine for hypertension.  At that time she was on CPAP therapy.   I saw her February 2018 at which time she was not using CPAP and needed a humidifier.  She had experienced episodes of atypical chest pain described as a shooting pain on the right side of her chest.  Oftentimes her pain occurs while she is sitting and is nonexertional.  She underwent a nuclear perfusion study in August 2017, which was low risk and showed normal perfusion.  EF was 49% without wall motion abnormalities.  She tells me she underwent rotator cuff surgery.     She [facility] by Dr. Michela Pitcher.  Apparently on that study she was not found to have significant overall sleep apnea with an AHI of 2.9.  There was very mild sleep apnea with supine position at 5.1 and during REM sleep at 7.2/h.  She has not been on CPAP therapy but it was recommended that if the patient gains weight and becomes more sleepy repeat study may be necessary.  An Epworth Sleepiness Scale score endorsed at 15 during her July 2019 evaluation which was consistent with significant daytime sleepiness.   I last saw her in March 2020 at which time she had felt well over the months previous to her appointment.  Most times her blood pressure is stable but there have been a couple of occurrences where she believes her blood pressure had gone below 100.  She has been taking amlodipine 5 mg, Toprol-XL 25 mg, and spironolactone 12.5 mg.  She is back on rosuvastatin 20 mg.  She will be under going laboratory with her primary physician Dr. Jackson Latino.  She believes she is sleeping well and is no longer on CPAP therapy.  She presents for  reevaluation.   She was  seen in the ER 03/14/19 for chest pain and dyspnea. She ruled out with two negative high sensitivity troponins and no acute findings on EKG. Her O2 saturation was 100% on room air. Dr. Johnsie Cancel  evaluated her in the ER and felt she was stable for discharge with close cardiology follow up - possible outpatient CT coronary if symptoms persist.   She was seen on March 26, 2019 by Coletta Memos, NP for ER follow-up.  She continued to have chest pain and dyspnea with exertion.  She was sleeping in a mechanical bed and states that the head is elevated and that she noticed that she sleeps better the higher the head of the bed is.  She continues to be active and dance in her house however, she states that she notices now she is only able to do about 15 minutes of activity at a time before she has to stop and take a break.    In the past she had been prescribed an albuterol inhaler by her primary physician which improved her shortness of breath.     She was referred by Coletta Memos, NP for a CT coronary angio on April 07, 2019 which showed mild coronary calcium elevation at 83 which was 58th percentile for age and sex matched control.  She had mild nonobstructive CAD with 25 to 49% stenosis in the proximal LAD, ramus and circumflex vessels.  I last saw her in November 2020.  She felt improved but still experience occasional sharp nonexertional chest pain that when last minutes and admitted to being short of breath most of the time  Subsequently, she has been seen by Fabian Sharp in April 2000.  She was reevaluated in the emergency room over sinus rhythm with PACs, right bundle branch.  She received Cipro, pain medication.  She was last evaluated by Coletta Memos, NP on April 15, 2021.  She had developed COVID in the spring 2022 and had noticed some subsequent shortness of breath.  Presently, she admits to being under increased stress with the development of dementia and her husband.  She  admits to shortness of breath particular when she leans over.  She is diabetic since 2009.  She continues to experience atypical chest pain.  Currently she is on amlodipine 5 mg for hypertension, Farxiga and Ozempic for diabetes mellitus, omeprazole for GERD, and takes atorvastatin 20 mg daily for hyperlipidemia.  She presents for reevaluation.   Past Medical History:  Diagnosis Date   Anxiety    Bipolar disorder ([place])    Chest pain    a. 01/2003 MV w/ ? ant-inflat ischemia-->Cath: LAD 20p, otw nonobs dzs-->Med Rx; b. 2009 Cath: LAD 30, LCX nl/nondominant, RCA 30, nl EF; c. 08/2014 MV: no ischemia/infarct.   Complication of anesthesia    pt. states she is difficult to wake up   Depression    Diabetes mellitus    Type II   Diabetic neuropathy (HCC)    Diverticulitis    GERD (gastroesophageal reflux  disease)    Headache    History of bronchitis    Hypercholesteremia    Hypertension    Hypokalemia    PONV (postoperative nausea and vomiting)    Shortness of breath dyspnea    Sleep apnea    Vertigo    patient reported    Past Surgical History:  Procedure Laterality Date   [place]  2009   normal cath   CARDIAC CATHETERIZATION  2004   normal cath   CHOLECYSTECTOMY  1994   TONSILLECTOMY     TONSILLECTOMY/ADENOIDECTOMY/TURBINATE REDUCTION Bilateral 03/01/2015   Procedure: TONSILLECTOMY/TURBINATE REDUCTION;  Surgeon: Rozetta Nunnery, MD;  Location: [facility];  Service: ENT;  Laterality: Bilateral;   UVULOPALATOPHARYNGOPLASTY Bilateral 03/01/2015   Procedure: UVULOPALATOPHARYNGOPLASTY (UPPP);  Surgeon: Rozetta Nunnery, MD;  Location: [facility];  Service: ENT;  Laterality: Bilateral;   WISDOM TOOTH EXTRACTION      Current Medications: Outpatient Medications Prior to Visit  Medication Sig Dispense Refill   amLODipine (NORVASC) 5 MG tablet Take 1 tablet (5 mg total) by mouth daily. 90 tablet 3   aspirin EC 81 MG  tablet Take 81 mg by mouth daily.     atorvastatin (LIPITOR) 20 MG tablet Take 20 mg by mouth daily.     Blood Glucose Monitoring Suppl (TRUE METRIX AIR GLUCOSE METER) w/Device KIT Use as instructed to check blood sugar daly 1 kit 0   celecoxib (CELEBREX) 200 MG capsule Take 200 mg by mouth daily as needed.      dapagliflozin propanediol (FARXIGA) 5 MG TABS tablet Take 1 tablet (5 mg total) by mouth daily before breakfast. 90 tablet 3   gentamicin (GARAMYCIN) 0.3 % ophthalmic solution Place 2 drops into the right eye every 4 (four) hours. 5 mL 0   glucose blood (TRUE METRIX BLOOD GLUCOSE TEST) test strip Use as instructed to check blood sugar 2X daily 200 each 3   Multiple Vitamin (MULTIVITAMIN) capsule Take 1 capsule by mouth daily.     olopatadine (PATANOL) 0.1 % ophthalmic solution Place 1 drop into both eyes 2 (two) times daily. 5 mL 12   omeprazole (PRILOSEC) 40 MG capsule Take 40 mg by mouth daily.     Semaglutide, 1 MG/DOSE, (OZEMPIC, 1 MG/DOSE,) 4 MG/3ML SOPN Inject 1 mg into the skin once a week. 9 mL 3   valACYclovir (VALTREX) 500 MG tablet Take 500 mg by mouth See admin instructions. Takes Mon,Tues,Wed bid     No facility-administered medications prior to visit.     Allergies:   Other and Latex   Social History   Socioeconomic History   Marital status: Divorced    Spouse name: Not on file   Number of children: Not on file   Years of education: some college   Highest education level: Not on file  Occupational History   Occupation: Retired Quarry manager  Tobacco Use   Smoking status: Former    Types: Cigarettes    Quit date: 08/07/1982    Years since quitting: 39.5   Smokeless tobacco: Never  Vaping Use   Vaping Use: Never used  Substance and Sexual Activity   Alcohol use: Yes    Comment: occasional / social   Drug use: No   Sexual activity: Yes    Partners: Male    Birth control/protection: Surgical  Other Topics Concern   Not on file  Social History Narrative   Lives at  home alone.  Right-handed.    1 cup caffeine per day.   Social Determinants of Health   Financial Resource Strain: Not on file  Food Insecurity: Not on file  Transportation Needs: Not on file  Physical Activity: Not on file  Stress: Not on file  Social Connections: Not on file     Family History:  The patient's family history includes Arthritis in her mother and sister; Bursitis in her sister; Crohn's disease in her sister; Diabetes in her maternal grandmother, mother, paternal grandmother, and sister; Gout in her mother; Heart disease in her father, maternal grandmother, and paternal grandmother; Heart failure in her sister; Hypertension in her mother.   ROS General: Negative; No fevers, chills, or night sweats;  HEENT: Negative; No changes in vision or hearing, sinus congestion, difficulty swallowing Pulmonary: Negative; No cough, wheezing, shortness of breath, hemoptysis Cardiovascular: See HPI GI: Negative; No nausea, vomiting, diarrhea, or abdominal pain GU: Negative; No dysuria, hematuria, or difficulty voiding Musculoskeletal: Negative; no myalgias, joint pain, or weakness Hematologic/Oncology: Negative; no easy bruising, bleeding Endocrine: Positive for diabetes since 2009 Neuro: Negative; no changes in balance, headaches Skin: Negative; No rashes or skin lesions Psychiatric: Negative; No behavioral problems, depression Sleep: Negative; No snoring, daytime sleepiness, hypersomnolence, bruxism, restless legs, hypnogognic hallucinations, no cataplexy Other comprehensive 14 point system review is negative.   PHYSICAL EXAM:   VS:  BP 130/90 (BP Location: Left Arm)   Pulse 77   Ht 5' 6"  (1.676 m)   Wt 172 lb (78 kg)   SpO2 94%   BMI 27.76 kg/m     Repeat blood pressure by me 124/78  Wt Readings from Last 3 Encounters:  01/31/22 172 lb (78 kg)  01/04/22 173 lb (78.5 kg)  08/15/21 177 lb (80.3 kg)    General: Alert, oriented, no distress.  Skin: normal turgor, no  rashes, warm and dry HEENT: Normocephalic, atraumatic. Pupils equal round and reactive to light; sclera anicteric; extraocular muscles intact;  Nose without nasal septal hypertrophy Mouth/Parynx benign; Mallinpatti scale 3 Neck: No JVD, no carotid bruits; normal carotid upstroke Lungs: clear to ausculatation and percussion; no wheezing or rales Chest wall: without tenderness to palpitation Heart: PMI not displaced, RRR, s1 s2 normal, 1/6 systolic murmur, no diastolic murmur, no rubs, gallops, thrills, or heaves Abdomen: soft, nontender; no hepatosplenomehaly, BS+; abdominal aorta nontender and not dilated by palpation. Back: no CVA tenderness Pulses 2+ Musculoskeletal: full range of motion, normal strength, no joint deformities Extremities: no clubbing cyanosis or edema, Homan's sign negative  Neurologic: grossly nonfocal; Cranial nerves grossly wnl Psychologic: Normal mood and affect   Studies/Labs Reviewed:   January 31, 2022 ECG (independently read by me): NSR at 77, Q III, aVF, RBBB, no ectopy  Recent Labs:    Latest Ref Rng & Units 08/15/2021    8:30 AM 04/04/2021    3:56 AM 08/05/2020    6:23 AM  BMP  Glucose 70 - 99 mg/dL 124  200  124   BUN 6 - 23 mg/dL 11  12  8    Creatinine 0.40 - 1.20 mg/dL 0.63  1.08  0.85   Sodium 135 - 145 mEq/L 141  141  140   Potassium 3.5 - 5.1 mEq/L 3.9  4.9  3.4   Chloride 96 - 112 mEq/L 102  105  103   CO2 19 - 32 mEq/L 31  27  26    Calcium 8.4 - 10.5 mg/dL 9.3  9.9  9.0  Latest Ref Rng & Units 04/04/2021    3:56 AM 08/05/2020    6:23 AM 01/05/2020   10:21 AM  Hepatic Function  Total Protein 6.5 - 8.1 g/dL 8.1  7.3  7.9   Albumin 3.5 - 5.0 g/dL 4.2  3.8  4.2   AST 15 - 41 U/L 28  31  17    ALT 0 - 44 U/L 37  54  23   Alk Phosphatase 38 - 126 U/L 126  127  143   Total Bilirubin 0.3 - 1.2 mg/dL 0.6  0.6  0.4        Latest Ref Rng & Units 04/04/2021    3:56 AM 08/05/2020    6:23 AM 01/05/2020   10:21 AM  CBC  WBC 4.0 - 10.5 K/uL  6.8  4.5  4.9   Hemoglobin 12.0 - 15.0 g/dL 15.3  15.1  14.4   Hematocrit 36.0 - 46.0 % 46.5  46.7  44.9   Platelets 150 - 400 K/uL 198  192  178    Lab Results  Component Value Date   MCV 86.4 04/04/2021   MCV 87.8 08/05/2020   MCV 88.2 01/05/2020   Lab Results  Component Value Date   TSH 0.835 08/30/2015   Lab Results  Component Value Date   HGBA1C 6.6 (A) 08/15/2021     BNP No results found for: "BNP"  ProBNP No results found for: "PROBNP"   Lipid Panel     Component Value Date/Time   CHOL 148 07/29/2019 1047   TRIG 160 (H) 07/29/2019 1047   HDL 56 07/29/2019 1047   CHOLHDL 2.6 07/29/2019 1047   CHOLHDL 2.1 02/29/2016 0856   VLDL 19 02/29/2016 0856   LDLCALC 65 07/29/2019 1047   LABVLDL 27 07/29/2019 1047     RADIOLOGY: No results found.   Additional studies/ records that were reviewed today include:  I reviewed her prior records and most recent assessment by Coletta Memos, NP.  Laboratory from April 15, 2021 was reviewed: Total cholesterol 154, HDL 51, triglycerides 183, LDL 75.  ASSESSMENT:    1. Atypical chest pain   2. SOB (shortness of breath)   3. Mild obstructive sleep apnea   4. Mild CAD   5. Right bundle branch block   6. Hyperlipidemia LDL goal <70     PLAN:  [name] is a 66 year old female who has a longstanding history of somewhat atypical chest pain.  She had previously undergone cardiac catheterization in June 2004 which showed mild nonobstructive CAD with 20% proximal LAD stenosis.  She had been treated with aggressive statin therapy and attempt to induce plaque regression.  In 2009 a repeat catheterization continue to show nonobstructive CAD with 30% LAD and RCA stenoses and a normal nondominant circumflex with normal LV function.  On prior echocardiographic evaluation his EF has been normal.  She has a history of previously documented CPAP therapy and is no longer on treatment.  She is diabetic since 2009.  She has  chronic right bundle branch block which has been stable.  Presently she admits to being under significant increased stress with her husband's early dementia.  Her blood pressure today by me is stable at 124/78 which is improved when checked by the nurse when it was 130/90.  I recommended she continue amlodipine 5 mg daily.  She is on atorvastatin 20 mg for hyperlipidemia.  She is diabetic on [place] and Ozempic.  She had undergone right rotator cuff surgery at [facility] 4 years ago and tolerated this well.  With her history of shortness of breath, I am recommending follow-up echocardiographic assessment to assess systolic and diastolic function valvular architecture.  I will contact her regarding the results.  If she is stable I will see her in 1 year for follow-up evaluation or sooner as needed.   Medication Adjustments/Labs and Tests Ordered: Current medicines are reviewed at length with the patient today.  Concerns regarding medicines are outlined above.  Medication changes, Labs and Tests ordered today are listed in the Patient Instructions below. Patient Instructions  Medication Instructions:  Your physician recommends that you continue on your current medications as directed. Please refer to the Current Medication list given to you today.  *If you need a refill on your cardiac medications before your next appointment, please call your pharmacy*   Lab Work: None If you have labs (blood work) drawn today and your tests are completely normal, you will receive your results only by: [place] (if you have MyChart) OR A paper copy in the mail If you have any lab test that is abnormal or we need to change your treatment, we will call you to review the results.   Testing/Procedures: Your physician has requested that you have an echocardiogram. Echocardiography is a painless test that uses sound waves to create images of your heart. It provides your doctor with information about the size and  shape of your heart and how well your heart's chambers and valves are working. This procedure takes approximately one hour. There are no restrictions for this procedure.    Follow-Up: At [facility], you and your health needs are our priority.  As part of our continuing mission to provide you with exceptional heart care, we have created designated Provider Care Teams.  These Care Teams include your primary Cardiologist (physician) and Advanced Practice Providers (APPs -  Physician Assistants and Nurse Practitioners) who all work together to provide you with the care you need, when you need it.  We recommend signing up for the patient portal called "MyChart".  Sign up information is provided on this After Visit Summary.  MyChart is used to connect with patients for Virtual Visits (Telemedicine).  Patients are able to view lab/test results, encounter notes, upcoming appointments, etc.  Non-urgent messages can be sent to your provider as well.   To learn more about what you can do with MyChart, go to [email or web address].    Your next appointment:   1 year(s)  The format for your next appointment:   In Person  Provider:   Shelva Majestic, MD     Other Instructions   Important Information About Sugar         Signed, Shelva Majestic, MD  02/05/2022 6:24 PM    [facility] [address] [place], [place], [place]  [address] Phone: [phone]

## 2022-02-05 ENCOUNTER — Encounter: Payer: Self-pay | Admitting: Cardiovascular Disease

## 2022-02-08 ENCOUNTER — Encounter: Payer: Self-pay | Admitting: Student

## 2022-02-08 ENCOUNTER — Ambulatory Visit (INDEPENDENT_AMBULATORY_CARE_PROVIDER_SITE_OTHER): Payer: Medicare HMO | Admitting: Student

## 2022-02-08 VITALS — BP 129/89 | HR 84 | Ht 66.0 in | Wt 172.6 lb

## 2022-02-08 DIAGNOSIS — E785 Hyperlipidemia, unspecified: Secondary | ICD-10-CM | POA: Diagnosis not present

## 2022-02-08 DIAGNOSIS — R519 Headache, unspecified: Secondary | ICD-10-CM

## 2022-02-08 NOTE — Patient Instructions (Signed)
It was great to see you today! Thank you for choosing [facility] for your primary care. Bonnie Gordon was seen for headaches.  Today we addressed: Headaches: I would advise you try a heat pack.  Tylenol and ibuprofen are great as well you may use up to 4 g of Tylenol a day and 1800 mg of ibuprofen although I would not recommend this much.  You may also try Voltaren gel on the back of your head.  Caffeine can be helpful as well and Tylenol/caffeine mixes such as Excedrin may work.  If your headaches start worsening or are accompanied with fever, nausea vomiting, eye pain or sensitivity, please return. I will be checking a lipid panel and may adjust your cholesterol medication depending on the results.   Orders Placed This Encounter  Procedures   Lipid Panel   If you haven't already, sign up for My Chart to have easy access to your labs results, and communication with your primary care physician.  We are checking some labs today. If they are abnormal, I will call you. If they are normal, I will send you a MyChart message (if it is active) or a letter in the mail. If you do not hear about your labs in the next 2 weeks, please call the office.   You should return to our clinic Return if symptoms worsen or fail to improve.  I recommend that you always bring your medications to each appointment as this makes it easy to ensure you are on the correct medications and helps [place] not miss refills when you need them.  Please arrive 15 minutes before your appointment to ensure smooth check in process.  We appreciate your efforts in making this happen.  Please call the clinic at [phone] if your symptoms worsen or you have any concerns.  Thank you for allowing me to participate in your care, [name], DO 02/08/2022, 9:07 AM PGY-2, [facility]

## 2022-02-08 NOTE — Assessment & Plan Note (Signed)
Last LDL 65, currently on Lipitor 20 mg daily.  Endorses compliance.  Recheck lipid panel and adjust as needed for LDL less than 70.

## 2022-02-08 NOTE — Assessment & Plan Note (Signed)
Given history, ruled out meningitis, temporal arteritis, migraines, stroke.  More likely to be tension headache. Poorly controlled with periodic medication use.  Suspect worsened by stress.  Advised heat pack and Voltaren gel as needed.  May consider increased dosage of Tylenol or ibuprofen. Return precautions discussed.

## 2022-02-08 NOTE — Progress Notes (Signed)
  SUBJECTIVE:   CHIEF COMPLAINT / HPI:   Headaches: every morning for the last month. Tightness in her head. She states it may be related to stress with her husband's dementia. Tylenol 1000mg  (sometimes BID) and Ibuprofen 400mg  (sometimes BID). She is just using tylenol currently. She used to have migraines but these do not feel like that. Sometimes the headaches last all day, sometimes not. They do bother her. Denies n/v, fever, jaw pain, recent falls, nothing makes them better or worse.   PERTINENT  PMH / PSH: T2DM, bipolar, HTN, sleep apnea  OBJECTIVE:  BP 129/89   Pulse 84   Ht 5\' 6"  (1.676 m)   Wt 172 lb 9.6 oz (78.3 kg)   SpO2 99%   BMI 27.86 kg/m   General: NAD, pleasant, able to participate in exam Cardiac: RRR, no murmurs auscultated Respiratory: CTAB, normal WOB Neuro: alert, no obvious focal deficits, speech normal, EOMI, PERRL MSK: Tenderness of occipital ridge, without neck or spinal tenderness Psych: Normal affect and mood  ASSESSMENT/PLAN:  Hyperlipidemia LDL goal <70 Last LDL 65, currently on Lipitor 20 mg daily.  Endorses compliance.  Recheck lipid panel and adjust as needed for LDL less than 70.  Nonintractable headache Given history, ruled out meningitis, temporal arteritis, migraines, stroke.  More likely to be tension headache. Poorly controlled with periodic medication use.  Suspect worsened by stress.  Advised heat pack and Voltaren gel as needed.  May consider increased dosage of Tylenol or ibuprofen. Return precautions discussed.    Orders Placed This Encounter  Procedures   Lipid Panel   Return if symptoms worsen or fail to improve. , DO 02/08/2022, 11:08 AM PGY-2, [facility]

## 2022-02-09 LAB — LIPID PANEL
Chol/HDL Ratio: 3.6 ratio (ref 0.0–4.4)
Cholesterol, Total: 168 mg/dL (ref 100–199)
HDL: 47 mg/dL (ref >39)
LDL Chol Calc (NIH): 74 mg/dL (ref 0–99)
Triglycerides: 292 mg/dL — ABNORMAL HIGH (ref 0–149)
VLDL Cholesterol Cal: 47 mg/dL — ABNORMAL HIGH (ref 5–40)

## 2022-02-14 ENCOUNTER — Emergency Department (HOSPITAL_COMMUNITY): Payer: Medicare HMO

## 2022-02-14 ENCOUNTER — Encounter (HOSPITAL_COMMUNITY): Payer: Self-pay | Admitting: Emergency Medicine

## 2022-02-14 ENCOUNTER — Emergency Department (HOSPITAL_COMMUNITY)
Admission: EM | Admit: 2022-02-14 | Discharge: 2022-02-14 | Disposition: A | Discharge status: Home / Self Care | Payer: Medicare HMO | Attending: Emergency Medicine | Admitting: Emergency Medicine

## 2022-02-14 DIAGNOSIS — Z91040 Latex allergy status: Secondary | ICD-10-CM | POA: Diagnosis not present

## 2022-02-14 DIAGNOSIS — R519 Headache, unspecified: Secondary | ICD-10-CM | POA: Diagnosis not present

## 2022-02-14 DIAGNOSIS — Z7982 Long term (current) use of aspirin: Secondary | ICD-10-CM | POA: Insufficient documentation

## 2022-02-14 DIAGNOSIS — Z79899 Other long term (current) drug therapy: Secondary | ICD-10-CM | POA: Diagnosis not present

## 2022-02-14 DIAGNOSIS — Z794 Long term (current) use of insulin: Secondary | ICD-10-CM | POA: Diagnosis not present

## 2022-02-14 DIAGNOSIS — I10 Essential (primary) hypertension: Secondary | ICD-10-CM | POA: Insufficient documentation

## 2022-02-14 DIAGNOSIS — Z7984 Long term (current) use of oral hypoglycemic drugs: Secondary | ICD-10-CM | POA: Diagnosis not present

## 2022-02-14 DIAGNOSIS — E119 Type 2 diabetes mellitus without complications: Secondary | ICD-10-CM | POA: Insufficient documentation

## 2022-02-14 LAB — CBC WITH DIFFERENTIAL/PLATELET
Abs Immature Granulocytes: 0.02 10*3/uL (ref 0.00–0.07)
Basophils Absolute: 0.1 10*3/uL (ref 0.0–0.1)
Basophils Relative: 1 %
Eosinophils Absolute: 0.2 10*3/uL (ref 0.0–0.5)
Eosinophils Relative: 4 %
HCT: 42.7 % (ref 36.0–46.0)
Hemoglobin: 14.5 g/dL (ref 12.0–15.0)
Immature Granulocytes: 0 %
Lymphocytes Relative: 37 %
Lymphs Abs: 1.8 10*3/uL (ref 0.7–4.0)
MCH: 29 pg (ref 26.0–34.0)
MCHC: 34 g/dL (ref 30.0–36.0)
MCV: 85.4 fL (ref 80.0–100.0)
Monocytes Absolute: 0.4 10*3/uL (ref 0.1–1.0)
Monocytes Relative: 8 %
Neutro Abs: 2.5 10*3/uL (ref 1.7–7.7)
Neutrophils Relative %: 50 %
Platelets: 194 10*3/uL (ref 150–400)
RBC: 5 MIL/uL (ref 3.87–5.11)
RDW: 13.2 % (ref 11.5–15.5)
WBC: 4.9 10*3/uL (ref 4.0–10.5)
nRBC: 0 % (ref 0.0–0.2)

## 2022-02-14 LAB — COMPREHENSIVE METABOLIC PANEL
ALT: 17 U/L (ref 0–44)
AST: 14 U/L — ABNORMAL LOW (ref 15–41)
Albumin: 3.7 g/dL (ref 3.5–5.0)
Alkaline Phosphatase: 137 U/L — ABNORMAL HIGH (ref 38–126)
Anion gap: 9 (ref 5–15)
BUN: 7 mg/dL — ABNORMAL LOW (ref 8–23)
CO2: 26 mmol/L (ref 22–32)
Calcium: 9.2 mg/dL (ref 8.9–10.3)
Chloride: 105 mmol/L (ref 98–111)
Creatinine, Ser: 0.8 mg/dL (ref 0.44–1.00)
GFR, Estimated: >60 mL/min (ref >60)
Glucose, Bld: 132 mg/dL — ABNORMAL HIGH (ref 70–99)
Potassium: 3.5 mmol/L (ref 3.5–5.1)
Sodium: 140 mmol/L (ref 135–145)
Total Bilirubin: 0.7 mg/dL (ref 0.3–1.2)
Total Protein: 7 g/dL (ref 6.5–8.1)

## 2022-02-14 MED ORDER — KETOROLAC TROMETHAMINE 30 MG/ML IJ SOLN
30.0000 mg | Freq: Once | INTRAMUSCULAR | Status: AC
  Administered 2022-02-14: 30 mg via INTRAVENOUS
  Filled 2022-02-14: qty 1

## 2022-02-14 MED ORDER — LORATADINE 10 MG PO TABS: 10.0000 mg | ORAL_TABLET | Freq: Every evening | ORAL | 0 refills | Status: DC | PRN

## 2022-02-14 MED ORDER — DIPHENHYDRAMINE HCL 50 MG/ML IJ SOLN
25.0000 mg | Freq: Once | INTRAMUSCULAR | Status: AC
  Administered 2022-02-14: 25 mg via INTRAVENOUS
  Filled 2022-02-14: qty 1

## 2022-02-14 MED ORDER — SODIUM CHLORIDE 0.9 % IV BOLUS
1000.0000 mL | Freq: Once | INTRAVENOUS | Status: AC
  Administered 2022-02-14: 1000 mL via INTRAVENOUS

## 2022-02-14 MED ORDER — METOCLOPRAMIDE HCL 5 MG/ML IJ SOLN
10.0000 mg | Freq: Once | INTRAMUSCULAR | Status: AC
  Administered 2022-02-14: 10 mg via INTRAVENOUS
  Filled 2022-02-14: qty 2

## 2022-02-14 MED ORDER — FLUTICASONE PROPIONATE 50 MCG/ACT NA SUSP: 1.0000 | Freq: Every day | NASAL | 2 refills | Status: DC

## 2022-02-14 MED ORDER — HYDROCODONE-ACETAMINOPHEN 5-325 MG PO TABS: 1.0000 | ORAL_TABLET | ORAL | 0 refills | Status: DC | PRN

## 2022-02-14 MED ORDER — ALUM & MAG HYDROXIDE-SIMETH 200-200-20 MG/5ML PO SUSP
30.0000 mL | Freq: Once | ORAL | Status: AC
  Administered 2022-02-14: 30 mL via ORAL
  Filled 2022-02-14: qty 30

## 2022-02-14 NOTE — ED Provider Notes (Signed)
[facility] EMERGENCY DEPARTMENT Provider Note   CSN: [id number] Arrival date & time: 02/14/22  6301     History  Chief Complaint  Patient presents with   Headache    Bonnie Gordon is a 66 y.o. female.  Pt is a 66 yo female with a pmhx significant for htn, high cholesterol, sleep apnea, dm2, gerd, bipolar d/o, and diverticulitis.  Pt has had a headache that has been going on for a month.  She said it is at the top of her head.  She has been taking tylenol and ibuprofen which helps for a short time, but the pain comes back.  She has been under a lot of stress.  Her husband has recently been dx with dementia.  However, he thinks he can still do everything he used to do.  She's been having to manage him a lot.  She did get her eyes checked at [facility] in April and has a new glasses rx.  She said still has trouble seeing when she reads.  She denies pain with eye mvmt.  She said the sun has been bothering her eyes more than usual.  She has an appt with Dr. Gershon Crane for further eval of her eyes next week.  Pt denies any n/v.  No difficulty moving arms/legs.  She did see her pcp who told her to use ice packs and to take tylenol and ibuprofen.       Home Medications Prior to Admission medications   Medication Sig Start Date End Date Taking? Authorizing Provider  fluticasone (FLONASE) 50 MCG/ACT nasal spray Place 1 spray into both nostrils daily. 02/14/22  Yes Isla Pence, MD  HYDROcodone-acetaminophen (NORCO/VICODIN) 5-325 MG tablet Take 1 tablet by mouth every 4 (four) hours as needed. 02/14/22  Yes Isla Pence, MD  loratadine (CLARITIN) 10 MG tablet Take 1 tablet (10 mg total) by mouth at bedtime as needed for allergies (sinus pain). 02/14/22  Yes Isla Pence, MD  amLODipine (NORVASC) 5 MG tablet Take 1 tablet (5 mg total) by mouth daily. 05/09/21   Troy Sine, MD  aspirin EC 81 MG tablet Take 81 mg by mouth daily.    [provider]   atorvastatin (LIPITOR) 20 MG tablet Take 20 mg by mouth daily. 11/01/21   [provider]  Blood Glucose Monitoring Suppl (TRUE METRIX AIR GLUCOSE METER) w/Device KIT Use as instructed to check blood sugar daly 06/03/21   Philemon Kingdom, MD  celecoxib (CELEBREX) 200 MG capsule Take 200 mg by mouth daily as needed.     [provider]  dapagliflozin propanediol (FARXIGA) 5 MG TABS tablet Take 1 tablet (5 mg total) by mouth daily before breakfast. 04/12/21   Philemon Kingdom, MD  gentamicin (GARAMYCIN) 0.3 % ophthalmic solution Place 2 drops into the right eye every 4 (four) hours. 01/13/22   Talbot Grumbling, FNP  glucose blood (TRUE METRIX BLOOD GLUCOSE TEST) test strip Use as instructed to check blood sugar 2X daily 06/03/21   Philemon Kingdom, MD  Multiple Vitamin (MULTIVITAMIN) capsule Take 1 capsule by mouth daily.    [provider]  olopatadine (PATANOL) 0.1 % ophthalmic solution Place 1 drop into both eyes 2 (two) times daily. 01/13/22   Talbot Grumbling, FNP  omeprazole (PRILOSEC) 40 MG capsule Take 40 mg by mouth daily. 08/04/20   [provider]  Semaglutide, 1 MG/DOSE, (OZEMPIC, 1 MG/DOSE,) 4 MG/3ML SOPN Inject 1 mg into the skin once a week. 04/12/21  Philemon Kingdom, MD  valACYclovir (VALTREX) 500 MG tablet Take 500 mg by mouth See admin instructions. Takes Mon,Tues,Wed bid 05/24/16   [provider]      Allergies    Other and Latex    Review of Systems   Review of Systems  Neurological:  Positive for headaches.    Physical Exam Updated Vital Signs BP (!) 152/95   Pulse 65   Temp 97.7 F (36.5 C) (Oral)   Resp 14   SpO2 100%  Physical Exam Vitals and nursing note reviewed.  Constitutional:      Appearance: She is well-developed.  HENT:     Head: Normocephalic and atraumatic.     Mouth/Throat:     Mouth: Mucous membranes are moist.     Pharynx: Oropharynx is clear.  Eyes:     Extraocular Movements: Extraocular  movements intact.     Pupils: Pupils are equal, round, and reactive to light.  Cardiovascular:     Rate and Rhythm: Normal rate and regular rhythm.     Heart sounds: Normal heart sounds.  Pulmonary:     Effort: Pulmonary effort is normal.     Breath sounds: Normal breath sounds.  Abdominal:     General: Bowel sounds are normal.     Palpations: Abdomen is soft.  Musculoskeletal:        General: Normal range of motion.     Cervical back: Normal range of motion and neck supple.  Skin:    General: Skin is warm.  Neurological:     Mental Status: She is alert and oriented to person, place, and time.  Psychiatric:        Mood and Affect: Mood normal.        Speech: Speech normal.        Behavior: Behavior normal.     ED Results / Procedures / Treatments   Labs (all labs ordered are listed, but only abnormal results are displayed) Labs Reviewed  COMPREHENSIVE METABOLIC PANEL - Abnormal; Notable for the following components:      Result Value   Glucose, Bld 132 (*)    BUN 7 (*)    AST 14 (*)    Alkaline Phosphatase 137 (*)    All other components within normal limits  CBC WITH DIFFERENTIAL/PLATELET    EKG EKG Interpretation  Date/Time:  Tuesday February 14 2022 12:52:20 EDT Ventricular Rate:  73 PR Interval:  209 QRS Duration: 128 QT Interval:  417 QTC Calculation: 460 R Axis:   48 Text Interpretation: Sinus rhythm Right bundle branch block No significant change since last tracing Confirmed by Isla Pence [id number]) on 02/14/2022 12:58:58 PM  Radiology CT Head Wo Contrast  Result Date: 02/14/2022 CLINICAL DATA:  Severe headache. EXAM: CT HEAD WITHOUT CONTRAST TECHNIQUE: Contiguous axial images were obtained from the base of the skull through the vertex without intravenous contrast. RADIATION DOSE REDUCTION: This exam was performed according to the departmental dose-optimization program which includes automated exposure control, adjustment of the mA and/or kV according to  patient size and/or use of iterative reconstruction technique. COMPARISON:  01/05/2020 FINDINGS: Brain: No evidence of intracranial hemorrhage, acute infarction, hydrocephalus, extra-axial collection, or mass lesion/mass effect. Vascular:  No hyperdense vessel or other acute findings. Skull: No evidence of fracture or other significant bone abnormality. Sinuses/Orbits:  No acute findings. Other: None. IMPRESSION: Negative noncontrast head CT. Electronically Signed   By: Marlaine Hind M.D.   On: 02/14/2022 12:06    Procedures Procedures  Medications Ordered in ED Medications  sodium chloride 0.9 % bolus 1,000 mL (0 mLs Intravenous Stopped 02/14/22 1400)  ketorolac (TORADOL) 30 MG/ML injection 30 mg (30 mg Intravenous Given 02/14/22 1147)  alum & mag hydroxide-simeth (MAALOX/MYLANTA) 200-200-20 MG/5ML suspension 30 mL (30 mLs Oral Given 02/14/22 1254)  metoCLOPramide (REGLAN) injection 10 mg (10 mg Intravenous Given 02/14/22 1359)  diphenhydrAMINE (BENADRYL) injection 25 mg (25 mg Intravenous Given 02/14/22 1359)    ED Course/ Medical Decision Making/ A&P                           Medical Decision Making Amount and/or Complexity of Data Reviewed Labs: ordered. Radiology: ordered.  Risk OTC drugs. Prescription drug management.   This patient presents to the ED for concern of headache, this involves an extensive number of treatment options, and is a complaint that carries with it a high risk of complications and morbidity.  The differential diagnosis includes intracranial abn, electrolyte abn, dehydration, stress   Co morbidities that complicate the patient evaluation  htn, high cholesterol, sleep apnea, dm2, gerd, bipolar d/o, and diverticulitis   Additional history obtained:  Additional history obtained from epic chart review    Lab Tests:  I Ordered, and personally interpreted labs.  The pertinent results include:  cbc nl, cmp nl   Imaging Studies ordered:  I ordered  imaging studies including ct head  I independently visualized and interpreted imaging which showed  IMPRESSION:  Negative noncontrast head CT.   I agree with the radiologist interpretation   Cardiac Monitoring:  The patient was maintained on a cardiac monitor.  I personally viewed and interpreted the cardiac monitored which showed an underlying rhythm of: nsr   Medicines ordered and prescription drug management:  I ordered medication including ivfs and toradol  for pain  Reevaluation of the patient after these medicines showed that the patient improved I have reviewed the patients home medicines and have made adjustments as needed   Test Considered:  Mri, but no focal weakness   Critical Interventions:  pain control  Problem List / ED Course:  Headache:  pt is feeling better after fluids, toradol, reglan, and benadryl.  She has an appt next week with ophtho.  She is given a referral to neurology.  She is d/c with some flonase and sinus med as well as some lortab for severe pain. CP:  pt did get some cp while here.  This was after the toradol.  It went away with the gi cocktail and ekg nl.  Is suspect it was from the medicine.  Pt does have an echo scheduled for tomorrow by cards.     Reevaluation:  After the interventions noted above, I reevaluated the patient and found that they have :improved   Social Determinants of Health:  Lives at home with husband   Dispostion:  After consideration of the diagnostic results and the patients response to treatment, I feel that the patent would benefit from discharge with outpatient f/u.          Final Clinical Impression(s) / ED Diagnoses Final diagnoses:  Acute nonintractable headache, unspecified headache type    Rx / DC Orders ED Discharge Orders          Ordered    Ambulatory referral to Neurology       Comments: An appointment is requested in approximately: 1 week   02/14/22 1509    fluticasone (FLONASE) 50  MCG/ACT nasal spray  Daily        02/14/22 1512    loratadine (CLARITIN) 10 MG tablet  At bedtime PRN        02/14/22 1512    HYDROcodone-acetaminophen (NORCO/VICODIN) 5-325 MG tablet  Every 4 hours PRN        02/14/22 1512              Isla Pence, MD 02/14/22 1513

## 2022-02-14 NOTE — ED Triage Notes (Signed)
Patient complains of constant generalized headache for over one month. Reports history of migraines but has not had one in several years, does not take medication for migraine. Patient states she has seen her PCP for same but was only told to use heat packs to relieve pain. Patient is alert, oriented, ambulatory, and in no apparent distress at this time.

## 2022-02-15 ENCOUNTER — Telehealth: Payer: Self-pay | Admitting: Student

## 2022-02-15 ENCOUNTER — Ambulatory Visit (HOSPITAL_COMMUNITY): Payer: Medicare HMO | Attending: Internal Medicine

## 2022-02-15 DIAGNOSIS — R0789 Other chest pain: Secondary | ICD-10-CM | POA: Diagnosis present

## 2022-02-15 DIAGNOSIS — R0602 Shortness of breath: Secondary | ICD-10-CM | POA: Insufficient documentation

## 2022-02-15 LAB — ECHOCARDIOGRAM COMPLETE
Area-P 1/2: 3.65 cm2
S' Lateral: 2.4 cm

## 2022-02-15 NOTE — Telephone Encounter (Signed)
Called to discuss patient as she had not viewed her MyChart results for lipid panel.  LDL 74 with goal of less than 70.  Discussed increasing statin dosage but patient felt that her current level was close to goal and that she would be able to get it down with dietary changes.  Opted to return for recheck in 2 months and she will call the office to make this appointment.  Check LDL at that visit and reassess statin dosage.

## 2022-02-21 LAB — HM DIABETES EYE EXAM

## 2022-02-22 ENCOUNTER — Encounter: Payer: Self-pay | Admitting: Internal Medicine

## 2022-02-28 ENCOUNTER — Other Ambulatory Visit: Payer: Self-pay

## 2022-02-28 MED ORDER — ATORVASTATIN CALCIUM 20 MG PO TABS: 20.0000 mg | ORAL_TABLET | Freq: Every day | ORAL | 2 refills | Status: DC

## 2022-04-12 ENCOUNTER — Other Ambulatory Visit: Payer: Self-pay | Admitting: Internal Medicine

## 2022-04-16 NOTE — Progress Notes (Unsigned)
  SUBJECTIVE:   CHIEF COMPLAINT / HPI:   Hyperlipidemia: Home medications include Lipitor 20mg  daily. Endorses compliance. 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 She states he is acting differently than many of her dementia patients: urinates in public and acts differently which gets him into trouble. He still works 2 jobs and drives despite his doctor advising differently and will likely lose his license soon.  She notes that she gets chest pressure when her anxiety runs high regarding the situations. Does not have chest pain.  Hypertension: Currently taking amlodipine 5 mg daily.  States blood pressures at home are much better than in clinic.  PERTINENT  PMH / PSH: T2DM, bipolar, HTN, OSA on CPAP, CAD, RBBB  OBJECTIVE:  BP (!) 142/94   Pulse 84   Ht 5\' 6"  (1.676 m)   Wt 175 lb 6.4 oz (79.6 kg)   SpO2 97%   BMI 28.31 kg/m   General: NAD, pleasant, able to participate in exam Cardiac: RRR, no murmurs auscultated Respiratory: CTAB, normal WOB Psych: Normal affect and mood  ASSESSMENT/PLAN:  Hyperlipidemia LDL goal <70 Assessment & Plan: Last LDL 74, currently on Lipitor 20 mg daily.  Endorses compliance.  Recheck LDL today, fasting.  Consider increasing Lipitor dosage, discussed with patient.  Orders: -     LDL cholesterol, direct  Stress at home Assessment & Plan: Environmental regarding husband's dementia and his inappropriate behaviors in public in addition to that he is disregarding his doctors orders and continuing to work and drive.  Agreed on discussing with therapist prior to starting medications. Given nature of chest pressure, reviewed last echo 2 months prior which was unremarkable. Advised to contact insurance  company for appropriate therapists.  Consider Atarax for as needed antianxiety but may consider SSRI if needing a more long-term benefit in combination with therapy.   Essential hypertension Assessment & Plan: BP: (!) 142/94 today. Poorly controlled. Goal of less than 130/90.  Follow up in 4 weeks with blood pressure log.  Medication regimen: Amlodipine 5 mg daily   Healthcare maintenance Assessment & Plan: Diabetes managed by endocrinology.  Advise due for mammogram and patient states she had one last year and will get one in 2024.  No record of this which was conveyed to patient.    Return in about 4 weeks (around 05/15/2022) for High blood pressure. 2025, DO 04/17/2022, 9:28 AM PGY-2, [facility]

## 2022-04-17 ENCOUNTER — Encounter: Payer: Self-pay | Admitting: Student

## 2022-04-17 ENCOUNTER — Ambulatory Visit (INDEPENDENT_AMBULATORY_CARE_PROVIDER_SITE_OTHER): Payer: Medicare HMO | Admitting: Student

## 2022-04-17 VITALS — BP 142/94 | HR 84 | Ht 66.0 in | Wt 175.4 lb

## 2022-04-17 DIAGNOSIS — I10 Essential (primary) hypertension: Secondary | ICD-10-CM

## 2022-04-17 DIAGNOSIS — E785 Hyperlipidemia, unspecified: Secondary | ICD-10-CM | POA: Diagnosis not present

## 2022-04-17 DIAGNOSIS — Z0000 Encounter for general adult medical examination without abnormal findings: Secondary | ICD-10-CM

## 2022-04-17 DIAGNOSIS — F439 Reaction to severe stress, unspecified: Secondary | ICD-10-CM | POA: Diagnosis not present

## 2022-04-17 NOTE — Assessment & Plan Note (Addendum)
Environmental regarding husband's dementia and his inappropriate behaviors in public in addition to that he is disregarding his doctors orders and continuing to work and drive.  Agreed on discussing with therapist prior to starting medications. Given nature of chest pressure, reviewed last echo 2 months prior which was unremarkable. Advised to contact insurance company for appropriate therapists.  Consider Atarax for as needed antianxiety but may consider SSRI if needing a more long-term benefit in combination with therapy.

## 2022-04-17 NOTE — Assessment & Plan Note (Addendum)
BP: (!) 142/94 today. Poorly controlled. Goal of less than 130/90.  Follow up in 4 weeks with blood pressure log.  Medication regimen: Amlodipine 5 mg daily

## 2022-04-17 NOTE — Assessment & Plan Note (Signed)
Diabetes managed by endocrinology.  Advise due for mammogram and patient states she had one last year and will get one in 2024.  No record of this which was conveyed to patient.

## 2022-04-17 NOTE — Patient Instructions (Addendum)
It was great to see you today! Thank you for choosing [facility] for your primary care. Bonnie Gordon was seen for hyperlipidemia and stress.  Today we addressed: Hyperlipidemia: We are rechecking your cholesterol today.  If it continues to be elevated, I would recommend increasing your medication dosage of your Lipitor. 2.  Stress: I am sorry to hear about your continued stress regarding the complications of your husband's dementia.  We have agreed on trying to speak with a therapist first.  Should you continue to have worsening anxiety that you feel is significantly impacting her life, I would be happy to prescribe an as needed medication for this or further consider an antianxiety/antidepression medication which would go in combination with therapy.  Information on therapists, please go to [email or web address]. You can also contact your insurance company to find an in-network therapist.  3.  High blood pressure: Your blood pressure has remained high even on recheck.  I understand that there are some factors that may be impacting this.  I provided a blood pressure log and instructions on how to take your blood pressure appropriately.  Please follow-up in 4 weeks and bring this blood pressure log to your visit.  We may need to consider starting a blood pressure medication for you.  If you haven't already, sign up for My Chart to have easy access to your labs results, and communication with your primary care physician.  We are checking some labs today. If they are abnormal, I will call you. If they are normal, I will send you a MyChart message (if it is active) or a letter in the mail. If you do not hear about your labs in the next 2 weeks, please call the office. Call the clinic at [phone] if your symptoms worsen or you have any concerns.  You should return to our clinic Return in about 4 weeks (around 05/15/2022) for High blood pressure. Please arrive 15 minutes before your  appointment to ensure smooth check in process.  We appreciate your efforts in making this happen.  Thank you for allowing me to participate in your care, [name], DO 04/17/2022, 9:11 AM PGY-2, [facility]   Blood Pressure Record Sheet To take your blood pressure, you will need a blood pressure machine. You can buy a blood pressure machine (blood pressure monitor) at your clinic, drug store, or online. When choosing one, consider: An automatic monitor that has an arm cuff. A cuff that wraps snugly around your upper arm. You should be able to fit only one finger between your arm and the cuff. A device that stores blood pressure reading results. Do not choose a monitor that measures your blood pressure from your wrist or finger. Follow your health care provider's instructions for how to take your blood pressure. To use this form: Take your blood pressure medications every day These measurements should be taken when you have been at rest for at least 10-15 min Take at least 2 readings with each blood pressure check. This makes sure the results are correct. Wait 1-2 minutes between measurements. Write down the results in the spaces on this form. Keep in mind it should always be recorded systolic over diastolic. Both numbers are important.  Repeat this every day for 2-3 weeks, or as told by your health care provider.  Make a follow-up appointment with your health care provider to discuss the results.  Blood Pressure Log Date Medications taken? (Y/N) Blood Pressure Time of Day

## 2022-04-17 NOTE — Assessment & Plan Note (Signed)
Last LDL 74, currently on Lipitor 20 mg daily.  Endorses compliance.  Recheck LDL today, fasting.  Consider increasing Lipitor dosage, discussed with patient.

## 2022-04-18 LAB — LDL CHOLESTEROL, DIRECT: LDL Direct: 92 mg/dL (ref 0–99)

## 2022-05-09 ENCOUNTER — Other Ambulatory Visit: Payer: Self-pay | Admitting: Internal Medicine

## 2022-05-11 ENCOUNTER — Other Ambulatory Visit: Payer: Self-pay | Admitting: Student

## 2022-05-16 NOTE — Patient Instructions (Signed)
It was great to see you today! Thank you for choosing [facility] for your primary care. Bonnie Gordon was seen for follow-up.  Today we addressed: Diabetes: You passed your diabetic foot exam.  Please follow-up with endocrinology for your diabetes.  I am checking a urine today and this may impact starting another medication on you that would help with both protecting your kidneys and your blood pressure. Hypertension: Blood pressure continues to be elevated.  We have agreed to get you set up with our clinical pharmacist for ambulatory blood pressure monitoring.  Please make that appointment upfront. High cholesterol: We have increased your dosage of atorvastatin. Healthcare maintenance:You need a mammogram to prevent breast cancer.  Please schedule an appointment.  You can call [phone].      If you haven't already, sign up for My Chart to have easy access to your labs results, and communication with your primary care physician.  We are checking some labs today. If they are abnormal, I will call you. If they are normal, I will send you a MyChart message (if it is active) or a letter in the mail. If you do not hear about your labs in the next 2 weeks, please call the office. Call the clinic at [phone] if your symptoms worsen or you have any concerns.  You should return to our clinic Return in about 2 weeks (around 06/01/2022) for Hypertension. Please arrive 15 minutes before your appointment to ensure smooth check in process.  We appreciate your efforts in making this happen.  Thank you for allowing me to participate in your care, Wells Guiles, DO 05/18/2022, 9:01 AM PGY-2, [place]

## 2022-05-17 NOTE — Progress Notes (Signed)
SUBJECTIVE:   CHIEF COMPLAINT / HPI:   Hypertension: BP: (!) 142/88 today. Home medications include: Amlodipine 5 mg daily. She endorses taking these medications as prescribed. Does check blood pressure at home. Ranges between 120-140s/70s-90s, mostly >130/80. Patient has had a BMP in the past 1 year.  Type 2 Diabetes: Home medications include: Ozempic 1 mg weekly, Farxiga 5 mg daily.  This is managed by endocrinology.  Patient is up to date on diabetic eye. Patient is not up to date on diabetic foot exam.  Hyperlipidemia: Home medications include atorvastatin 20 mg daily. Endorses compliance. Last LDL 92. Goal is less than 70.  Left leg and bilateral foot pain: states she has experienced this for years and was prescribed gabapentin which assisted but then switched to celebrex which has helped.  She only needed to take Celebrex nightly at the most and uses this as needed.  Denies any difficulties with reflux while on omeprazole. She also has a bone spur at the top of her left foot and a bunion on the same foot.  She used to see a podiatrist in the past and is requesting referral for one today.  PERTINENT  PMH / PSH: T2DM, bipolar, HTN, OSA on CPAP, CAD, RBBB  OBJECTIVE:  BP (!) 142/88   Pulse 79   Ht 5\' 6"  (1.676 m)   Wt 174 lb (78.9 kg)   SpO2 97%   BMI 28.08 kg/m  Physical Exam Constitutional:      Appearance: Normal appearance.  Cardiovascular:     Rate and Rhythm: Normal rate and regular rhythm.     Heart sounds: Normal heart sounds.  Pulmonary:     Effort: Pulmonary effort is normal.     Breath sounds: Normal breath sounds.  Musculoskeletal:     Left foot: Bunion present.       Feet:  Neurological:     Mental Status: She is alert.  Psychiatric:        Mood and Affect: Mood normal.        Behavior: Behavior normal.    Diabetic foot exam was performed with the following findings:   No deformities, ulcerations, or other skin breakdown Normal sensation of 10g  monofilament Intact posterior tibialis and dorsalis pedis pulses    ASSESSMENT/PLAN:  Essential hypertension Assessment & Plan: BP: (!) 142/88 today. Poorly controlled. Goal of <130/80.  Patient is hesitant to add another blood pressure medication.  Upon chart review, has been on losartan and HCTZ in the past.  No intolerance noted.  Amenable to ABPM with Dr. Valentina Lucks.  Would greatly recommend ARB in the setting of T2DM.  Given her hesitation to add medications, may be beneficial to discuss combination pills for her. Medication regimen: Amlodipine 5 mg daily.   Poorly controlled type 2 diabetes mellitus with circulatory disorder [facility]) Assessment & Plan: well controlled - Last A1c: 6.6.  Management endocrinology.  Diabetic foot exam completed.  Obtain urine microalbumin ratio.  ARB would be beneficial for her.  Orders: -     Microalbumin / creatinine urine ratio; Future  Hyperlipidemia LDL goal <70 Assessment & Plan: Last LDL 92, increase Lipitor to 40 mg daily.  Orders: -     Atorvastatin Calcium; Take 1 tablet (40 mg total) by mouth daily.  Dispense: 90 tablet; Refill: 3  Foot pain, bilateral Assessment & Plan: Chronic, likely osteoarthritis.   Orders: -     Ambulatory referral to Podiatry -     Celecoxib; Take 1 capsule (200 mg total)  by mouth daily as needed.  Dispense: 90 capsule; Refill: 1  Healthcare maintenance Assessment & Plan: Recommend mammogram.  Information provided in AVS.   Return in about 2 weeks (around 06/01/2022) for Hypertension. [name], DO 05/18/2022, 12:14 PM PGY-2, [facility]

## 2022-05-18 ENCOUNTER — Encounter: Payer: Self-pay | Admitting: Student

## 2022-05-18 ENCOUNTER — Ambulatory Visit (INDEPENDENT_AMBULATORY_CARE_PROVIDER_SITE_OTHER): Payer: Medicare HMO | Admitting: Student

## 2022-05-18 VITALS — BP 142/88 | HR 79 | Ht 66.0 in | Wt 174.0 lb

## 2022-05-18 DIAGNOSIS — M79672 Pain in left foot: Secondary | ICD-10-CM

## 2022-05-18 DIAGNOSIS — E785 Hyperlipidemia, unspecified: Secondary | ICD-10-CM | POA: Diagnosis not present

## 2022-05-18 DIAGNOSIS — I10 Essential (primary) hypertension: Secondary | ICD-10-CM | POA: Diagnosis not present

## 2022-05-18 DIAGNOSIS — E1165 Type 2 diabetes mellitus with hyperglycemia: Secondary | ICD-10-CM

## 2022-05-18 DIAGNOSIS — E1159 Type 2 diabetes mellitus with other circulatory complications: Secondary | ICD-10-CM

## 2022-05-18 DIAGNOSIS — M79671 Pain in right foot: Secondary | ICD-10-CM | POA: Diagnosis not present

## 2022-05-18 DIAGNOSIS — Z0000 Encounter for general adult medical examination without abnormal findings: Secondary | ICD-10-CM

## 2022-05-18 MED ORDER — CELECOXIB 200 MG PO CAPS: 200.0000 mg | ORAL_CAPSULE | Freq: Every day | ORAL | 1 refills | Status: DC | PRN

## 2022-05-18 MED ORDER — ATORVASTATIN CALCIUM 40 MG PO TABS: 40.0000 mg | ORAL_TABLET | Freq: Every day | ORAL | 3 refills | Status: DC

## 2022-05-18 NOTE — Assessment & Plan Note (Signed)
Chronic, likely osteoarthritis.

## 2022-05-18 NOTE — Assessment & Plan Note (Signed)
well controlled - Last A1c: 6.6.  Management endocrinology.  Diabetic foot exam completed.  Obtain urine microalbumin ratio.  ARB would be beneficial for her.

## 2022-05-18 NOTE — Assessment & Plan Note (Addendum)
Recommend mammogram.  Information provided in AVS.

## 2022-05-18 NOTE — Assessment & Plan Note (Signed)
BP: (!) 142/88 today. Poorly controlled. Goal of <130/80.  Patient is hesitant to add another blood pressure medication.  Upon chart review, has been on losartan and HCTZ in the past.  No intolerance noted.  Amenable to ABPM with Dr. Valentina Lucks.  Would greatly recommend ARB in the setting of T2DM.  Given her hesitation to add medications, may be beneficial to discuss combination pills for her. Medication regimen: Amlodipine 5 mg daily.

## 2022-05-18 NOTE — Assessment & Plan Note (Signed)
Last LDL 92, increase Lipitor to 40 mg daily.

## 2022-05-19 LAB — MICROALBUMIN / CREATININE URINE RATIO
Creatinine, Urine: 110.6 mg/dL
Microalb/Creat Ratio: 20 mg/g creat (ref 0–29)
Microalbumin, Urine: 22.3 ug/mL

## 2022-05-26 ENCOUNTER — Ambulatory Visit (INDEPENDENT_AMBULATORY_CARE_PROVIDER_SITE_OTHER): Payer: Medicare HMO | Admitting: Pharmacist

## 2022-05-26 ENCOUNTER — Encounter: Payer: Self-pay | Admitting: Pharmacist

## 2022-05-26 VITALS — Wt 176.4 lb

## 2022-05-26 DIAGNOSIS — E1159 Type 2 diabetes mellitus with other circulatory complications: Secondary | ICD-10-CM

## 2022-05-26 DIAGNOSIS — E1165 Type 2 diabetes mellitus with hyperglycemia: Secondary | ICD-10-CM | POA: Diagnosis not present

## 2022-05-26 DIAGNOSIS — I10 Essential (primary) hypertension: Secondary | ICD-10-CM

## 2022-05-26 MED ORDER — OZEMPIC (1 MG/DOSE) 4 MG/3ML ~~LOC~~ SOPN: PEN_INJECTOR | SUBCUTANEOUS | 0 refills | Status: DC

## 2022-05-26 NOTE — Patient Instructions (Signed)
Continue Amdodipine 5mg  daily  Start Olmesartan/HCTZ 1 pill once daily  Next visit with Dr. Madison Hickman

## 2022-05-26 NOTE — Progress Notes (Unsigned)
S:     Chief Complaint  Patient presents with   Medication Management    Amb BP Monitor   66 y.o. female who presents for hypertension evaluation, education, and management.  PMH is significant for hyperlipidemia, and CAD.  Patient was referred and last seen by Primary Care Provider, Dr. Royal Piedra, on 05/18/2022.  At last visit, patient had elevated pressure of 142/88 but expressed hesitancy to add additional blood pressure medication.   Diagnosed with Hypertension 8-10 years ago    Medication compliance is reported to be good.  Discussed procedure for wearing the monitor and gave patient written instructions. Monitor was placed on non-dominant arm with instructions to return in the morning.   Current BP Medications include:  amlodipine 5 mg daily  Antihypertensives tried in the past include: HCTZ, losartan  Dietary habits include:  Day #2 - Patient returns to clinic and reports ***. ***Patient reports they were able to wear the Ambulatory Blood Pressure Cuff for the entire 24 evaluation period.   O:  Review of Systems  All other systems reviewed and are negative.   Physical Exam Constitutional:      Appearance: Normal appearance.  Pulmonary:     Effort: Pulmonary effort is normal.  Neurological:     Mental Status: She is alert.  Psychiatric:        Mood and Affect: Mood normal.        Behavior: Behavior normal.        Thought Content: Thought content normal.     Last 3 Office BP readings: BP Readings from Last 3 Encounters:  05/18/22 (!) 142/88  04/17/22 (!) 142/94  02/14/22 130/82    Clinical Atherosclerotic Cardiovascular Disease (ASCVD): Yes  The 10-year ASCVD risk score (Arnett DK, et al., 2019) is: 23.7%   Values used to calculate the score:     Age: 39 years     Sex: Female     Is Non-Hispanic African American: Yes     Diabetic: Yes     Tobacco smoker: No     Systolic Blood Pressure: 142 mmHg     Is BP treated: Yes     HDL Cholesterol: 47  mg/dL     Total Cholesterol: 168 mg/dL  Basic Metabolic Panel    Component Value Date/Time   NA 140 02/14/2022 0835   NA 144 07/29/2019 1047   K 3.5 02/14/2022 0835   CL 105 02/14/2022 0835   CO2 26 02/14/2022 0835   GLUCOSE 132 (H) 02/14/2022 0835   BUN 7 (L) 02/14/2022 0835   BUN 6 (L) 07/29/2019 1047   CREATININE 0.80 02/14/2022 0835   CREATININE 0.66 08/30/2015 1042   CALCIUM 9.2 02/14/2022 0835   GFRNONAA >60 02/14/2022 0835   GFRAA >60 01/05/2020 1021    Renal function: CrCl cannot be calculated (Patient's most recent lab result is older than the maximum 21 days allowed.).   ABPM Study Data: Arm Placement left arm  Overall Mean 24hr BP:   *** mmHg  HR: ***  Daytime Mean BP:  *** mmHg  HR: ***  Nighttime Mean BP:  *** mmHg  HR: ***  Dipping Pattern: {yes no:314532}  Sys:   ***   Dia: ***   [normal dipping ~10-20%]  *** For Office Goal Goal BP of <130/80:  ABPM thresholds: Overall BP < 125/75, daytime BP <130/80 mmHg, sleeptime BP <110/65 mmHg   *** For Office Goal Goal BP of <140/90:  ABPM thresholds: Overall BP < 130/80, daytime BP <  135/85 mmHg, sleeptime BP <120/70 mmHg    A/P: History of hypertension longstanding 8-10 years currently taking amlodipine 5mg  daily.    found to have white coat hypertension and blood presssure at ***goal.   2 4-hour ambulatory blood pressure demonstrates *** with an average AWAKE blood pressure of *** mmHg.  Nocturnal dipping pattern is {Desc; normal/abnormal w/wildcard:19060}.   -Changes to medications  ***.   Diabetes managed by endocrinology.  Patient requested refill of Ozempic (semaglutide) as she reports that she will run out prior to her appointment in early November.  She does not anticipate any change in dosing as she is doing well with her blood sugar.  - One time refill Ozempic 1mg  provided.    Results reviewed and written information provided.    Written patient instructions provided. Patient verbalized  understanding of treatment plan.  Total time in face to face counseling *** minutes.    Follow-up:  Pharmacist ***. PCP clinic visit in ***.  Patient seen with ***.

## 2022-05-29 MED ORDER — OLMESARTAN MEDOXOMIL-HCTZ 20-12.5 MG PO TABS: 1.0000 | ORAL_TABLET | Freq: Every day | ORAL | 1 refills | Status: DC

## 2022-05-29 NOTE — Progress Notes (Signed)
Reviewed: I agree with Dr. Koval's documentation and management. 

## 2022-05-29 NOTE — Assessment & Plan Note (Signed)
Diabetes managed by endocrinology.  Patient requested refill of Ozempic (semaglutide) as she reports that she will run out prior to her appointment in early November.  She does not anticipate any change in dosing as she is doing well with her blood sugar.  - One time refill Ozempic 1mg  provided.  At follow-up on 10/23 patient stated that the pharmacy did not have her Ozempic 1mg  prescription. Contacted pharmacy and determined that [place] (semaglutide) is in short supply currently and verified that patient is on the call when available for pick-up list.   Called patient back shared news on limited supply of Ozempic and offered her a short-term supply of Ozempic (semaglutide) as a sample.  She accepted and will pick up sample pen tomorrow 10/24 AM. Medication Samples have been provided for the patient to pick-up. Drug name: Ozempic (semaglutide)             Qty: 1 pen

## 2022-05-29 NOTE — Assessment & Plan Note (Signed)
History of hypertension longstanding 8-10 years currently taking amlodipine 5mg  daily with goal blood pressure of < 130/80.  Found to have persistently elevated and uncontrolled blood pressure with 24-hour ambulatory blood pressure evaluation which demonstrates an average AWAKE blood pressure of 150/92 mmHg.  Nocturnal dipping pattern is normal.   -Ccntinue Amlodipine 5mg  daily -START taking olmesartan 20/HCTZ 12.5mg  once daily.  At next visit with Dr. Madison Hickman, Reevaluate control - obtain BMET Consider multiple component pill with THREE agents - amlodipine/olmesartan/HCTZ at next Rx if controlled or near control AND if tolerating/BMET normal.

## 2022-05-30 ENCOUNTER — Ambulatory Visit (INDEPENDENT_AMBULATORY_CARE_PROVIDER_SITE_OTHER): Payer: Medicare HMO | Admitting: Podiatrist

## 2022-05-30 ENCOUNTER — Ambulatory Visit (INDEPENDENT_AMBULATORY_CARE_PROVIDER_SITE_OTHER): Payer: Medicare HMO

## 2022-05-30 DIAGNOSIS — M79672 Pain in left foot: Secondary | ICD-10-CM

## 2022-05-30 DIAGNOSIS — M79671 Pain in right foot: Secondary | ICD-10-CM

## 2022-05-30 DIAGNOSIS — G5793 Unspecified mononeuropathy of bilateral lower limbs: Secondary | ICD-10-CM

## 2022-05-30 DIAGNOSIS — G629 Polyneuropathy, unspecified: Secondary | ICD-10-CM

## 2022-05-30 MED ORDER — GABAPENTIN 300 MG PO CAPS: 300.0000 mg | ORAL_CAPSULE | Freq: Three times a day (TID) | ORAL | 3 refills | Status: DC

## 2022-05-30 NOTE — Progress Notes (Signed)
Chief Complaint  Patient presents with   Foot Pain    Foot pain, bilateral, constant going on 2 or more months, pain is from toes and radiates to the heel      HPI: Patient is 66 y.o. female who presents today for foot bail bilateral.  She relates a zinging type of pain at times. The pain is not constant but is bothersome when it happens.  She denies any pain in calves when walking.    Patient Active Problem List   Diagnosis Date Noted   Foot pain, bilateral 05/18/2022   Stress at home 04/17/2022   Healthcare maintenance 04/17/2022   Non-suicidal depressed mood 01/04/2022   Poorly controlled type 2 diabetes mellitus with circulatory disorder ([place]) 03/01/2016   RBBB 03/02/2015   CAD (coronary artery disease) 07/08/2014   Essential hypertension 07/08/2014   Hyperlipidemia LDL goal <70 07/08/2014   Obstructive sleep apnea syndrome, mild 04/08/2014    Current Outpatient Medications on File Prior to Visit  Medication Sig Dispense Refill   amLODipine (NORVASC) 5 MG tablet Take 1 tablet (5 mg total) by mouth daily. 90 tablet 3   aspirin EC 81 MG tablet Take 81 mg by mouth daily.     atorvastatin (LIPITOR) 40 MG tablet Take 1 tablet (40 mg total) by mouth daily. 90 tablet 3   Biotin 10000 MCG TABS Take by mouth.     Blood Glucose Monitoring Suppl (TRUE METRIX AIR GLUCOSE METER) w/Device KIT Use as instructed to check blood sugar daly 1 kit 0   celecoxib (CELEBREX) 200 MG capsule Take 1 capsule (200 mg total) by mouth daily as needed. 90 capsule 1   FARXIGA 5 MG TABS tablet TAKE 1 TABLET BY MOUTH DAILY BEFORE BREAKFAST. 90 tablet 3   fluticasone (FLONASE) 50 MCG/ACT nasal spray Place 1 spray into both nostrils daily. 11.1 mL 2   glucose blood (TRUE METRIX BLOOD GLUCOSE TEST) test strip Use as instructed to check blood sugar 2X daily 200 each 3   Metamucil Fiber CHEW Chew by mouth.     olmesartan-hydrochlorothiazide (BENICAR HCT) 20-12.5 MG tablet Take 1 tablet by mouth daily. 30 tablet 1    Semaglutide, 1 MG/DOSE, (OZEMPIC, 1 MG/DOSE,) 4 MG/3ML SOPN INJECT 1MG INTO THE SKIN ONCE A WEEK 9 mL 0   valACYclovir (VALTREX) 500 MG tablet Take 500 mg by mouth See admin instructions. Takes Mon,Tues,Wed bid     No current facility-administered medications on file prior to visit.    Allergies  Allergen Reactions   Other Other (See Comments)    No Blood Products, personal preference   Latex Itching and Rash    Review of Systems No fevers, chills, nausea, muscle aches, no difficulty breathing, no calf pain, no chest pain or shortness of breath.   Physical Exam  GENERAL APPEARANCE: Alert, conversant. Appropriately groomed. No acute distress.   VASCULAR: Pedal pulses palpable 1/4 DP and 1/4 PT bilateral.  Capillary refill time is immediate to all digits,  Proximal to distal cooling is warm to warm.  Digital perfusion adequate.   NEUROLOGIC: sensation is intact to 5.07 monofilament at 3/5 sites bilateral.  Light touch is intact bilateral, vibratory sensation intact bilateral  MUSCULOSKELETAL: acceptable muscle strength, tone and stability bilateral.  No gross boney pedal deformities noted.  No pain, crepitus or limitation noted with foot and ankle range of motion bilateral. Pain at palpation of right heel near plantar fascia insertion elicited today.   DERMATOLOGIC: skin is warm, supple, and dry.  Color, texture, and turgor of skin within normal limits.  No open wounds are noted.  No preulcerative lesions are seen.  Digital nails are asymptomatic.    Radiographic exam:  Bilateral feet-   Minimal bunion with increased medial eminence present.  Normal osseous mineralization.  No fracture or dislocation or acute osseous abnormalities present.  Joint spaces are normal.    Assessment     ICD-10-CM   1. Foot pain, bilateral  M79.671 DG Foot Complete Left   M79.672 DG Foot Complete Right    2. Neuropathy of both feet  G57.93         Plan  Discussed exam and xray findings.   Discussed this appears to be neuropathy and we could do a trial of Gabapentin to see if this offers any relief.  Will ramp up to 300 tid and return in a month for a recheck. If any problems arise, Bernise will call.

## 2022-05-31 ENCOUNTER — Ambulatory Visit: Payer: Medicare HMO | Admitting: Internal Medicine

## 2022-06-06 NOTE — Progress Notes (Deleted)
  SUBJECTIVE:   CHIEF COMPLAINT / HPI:   Hypertension:   today. Home medications include: Amlodipine 5 mg daily, olmesartan 20 mg/HCTZ 12.5 mg daily. She {Blank single:19197::"endorses","does not endorse"} taking these medications as prescribed. {Blank single:19197::"Does","Does not"} check blood pressure at home. Patient has had a BMP in the past 1 year.  PERTINENT  PMH / PSH: HTN, HLD, T2DM, CAD, OSA  OBJECTIVE:  There were no vitals taken for this visit. Physical Exam   ASSESSMENT/PLAN:  There are no diagnoses linked to this encounter. No follow-ups on file. Wells Guiles, DO 06/06/2022, 8:34 AM PGY-***, [facility] {    This will disappear when note is signed, click to select method of visit    :1}

## 2022-06-07 ENCOUNTER — Other Ambulatory Visit: Payer: Self-pay | Admitting: Cardiovascular Disease

## 2022-06-07 ENCOUNTER — Other Ambulatory Visit: Payer: Self-pay | Admitting: Podiatrist

## 2022-06-07 ENCOUNTER — Other Ambulatory Visit: Payer: Self-pay | Admitting: Internal Medicine

## 2022-06-07 DIAGNOSIS — G629 Polyneuropathy, unspecified: Secondary | ICD-10-CM

## 2022-06-07 DIAGNOSIS — E1165 Type 2 diabetes mellitus with hyperglycemia: Secondary | ICD-10-CM

## 2022-06-09 ENCOUNTER — Ambulatory Visit: Payer: Medicare HMO | Admitting: Student

## 2022-06-13 ENCOUNTER — Encounter: Payer: Self-pay | Admitting: Internal Medicine

## 2022-06-13 ENCOUNTER — Ambulatory Visit (INDEPENDENT_AMBULATORY_CARE_PROVIDER_SITE_OTHER): Payer: Medicare HMO | Admitting: Internal Medicine

## 2022-06-13 VITALS — BP 108/72 | HR 92 | Ht 66.0 in | Wt 177.4 lb

## 2022-06-13 DIAGNOSIS — E669 Obesity, unspecified: Secondary | ICD-10-CM

## 2022-06-13 DIAGNOSIS — E1159 Type 2 diabetes mellitus with other circulatory complications: Secondary | ICD-10-CM | POA: Diagnosis not present

## 2022-06-13 DIAGNOSIS — E1165 Type 2 diabetes mellitus with hyperglycemia: Secondary | ICD-10-CM | POA: Diagnosis not present

## 2022-06-13 DIAGNOSIS — E785 Hyperlipidemia, unspecified: Secondary | ICD-10-CM | POA: Diagnosis not present

## 2022-06-13 LAB — POCT GLYCOSYLATED HEMOGLOBIN (HGB A1C): Hemoglobin A1C: 7.6 % — AB (ref 4.0–5.6)

## 2022-06-13 NOTE — Patient Instructions (Addendum)
Please continue: - Farxiga 5 mg daily before breakfast - Ozempic 1 mg weekly  STOP SWEET DRINKS!!!   Please return in 4 months with your sugar log.

## 2022-06-13 NOTE — Progress Notes (Signed)
Patient ID: Bonnie Gordon, female   DOB: 12/24/1955, 66 y.o.   MRN: [id number]  HPI: Bonnie Gordon is a 66 y.o.-year-old female, presenting for follow-up for DM2, dx in 2012, insulin-independent, uncontrolled, with complications (CAD, PN). Last visit 10 months ago. She establish care with [facility].  Interim history: + blurry vision, + chest pain (related to anxiety).  She continues to have increased stress as husband has memory loss. She was off Ozempic for 3 weeks (shortage) - just restarted. She has a little nausea. She also feels that she gained weight. She relaxed her diet >> eating more sweets and drinks sweet tea.  Reviewed HbA1c levels: Lab Results  Component Value Date   HGBA1C 6.6 (A) 08/15/2021   HGBA1C 7.2 (A) 04/12/2021   HGBA1C 6.8 (A) 12/07/2020   HGBA1C 6.9 (A) 08/09/2020   HGBA1C 7.2 (A) 04/06/2020   HGBA1C 8.3 (A) 11/25/2019   HGBA1C 7.1 (A) 07/29/2019   HGBA1C 7.7 (A) 04/01/2019   HGBA1C 7.5 (A) 07/15/2018   HGBA1C 8.1 08/24/2017   HGBA1C 6.8 04/25/2017   HGBA1C 6.5 01/18/2017   HGBA1C 6.2 10/18/2016   HGBA1C 7.5 07/20/2016   HGBA1C 9.5 04/20/2016   HGBA1C 8.1 (H) 08/30/2015   HGBA1C 7.1 (H) 02/25/2015   HGBA1C 7.0 (H) 08/26/2014   HGBA1C (H) 09/16/2009    6.5 (NOTE) The ADA recommends the following therapeutic goal for glycemic control related to Hgb A1c measurement: Goal of therapy: <6.5 Hgb A1c  Reference: American Diabetes Association: Clinical Practice Recommendations 2010, Diabetes Care, 2010, 33: (Suppl  1).  04/13/2021 ([facility]) HbA1c 7.1%  11/2018: HbA1c 7.2% 01/14/2016: HbA1c 11.3%  Patient is on: - Farxiga 5 mg daily before breakfast - added 04/2021 - Ozempic 0.5 >> 1 mg weekly She was previously on: - Levemir up to 60 units at bedtime-stopped 11/2018 - Glipizide 5 mg before dinner  - Glimepiride 4 mg before b'fast/ Glipizide XL 10 mg in am  - Tradjenta 5 mg in am  - Victoza 1.8 mg daily These were stopped when she started  insulin. She was on Metformin >> diarrhea.  Pt checks her sugars 1x-2x a day (a CGM was not approved for her).: - am: 94-139 >> 90s, 135-165 >> 100-123 >> 150-160s - 2h after b'fast: 127 >> n/c >> 126 >> n/c >> 141 >> n/c - before lunch: 100, 120, 153 >> n/c >> 64 >> n/c - 2h after lunch: n/c >> 167 (cake) >> n/c - before dinner: 68, 113 >> 80-120 >> n/c >> 130 >> n/c - 2h after dinner: n/c >> 196 x1 >> n/c - bedtime: up to 145, except 205, 219 >> up to 143 >> 120-164 - nighttime: n/c Lowest sugar was 64 >> ...  76 >> 90s >> 100 >> 90; it is unclear at which level she has hypoglycemia awareness. Highest sugar was 409 (?) >>... 141 >> 205, 219 >> 164.  Glucometer: AccuChek [employer]iva  Meals: fish  - fried - once a month when she eats out; no red meat.  Salads with ranch dressing.  -No CKD, last BUN/creatinine: Lab Results  Component Value Date   BUN 7 (L) 02/14/2022   BUN 11 08/15/2021   CREATININE 0.80 02/14/2022   CREATININE 0.63 08/15/2021  04/13/2021 ([facility]): 10/0.74, GFR 90, glucose 114 01/13/2016: 12/0.83, glucose 472  -+ HL; last set of lipids: Lab Results  Component Value Date   CHOL 168 02/08/2022   HDL 47 02/08/2022   LDLCALC 74 02/08/2022   LDLDIRECT  92 04/17/2022   TRIG 292 (H) 02/08/2022   CHOLHDL 3.6 02/08/2022  04/13/2021 ([facility]): 154/183/61/75 01/13/2016: 147/218/73/69 On Lipitor 40 mg daily.  - last eye exam was in 02/2022: no DR. She has incipient stable cataract. Sees [facility].  -+ Occasional numbness and tingling in her feet.  She sees podiatry.  Previously on Neurontin but ran out. I suggested alpha-lipoic acid and B complex in the past but she did not start.  Last foot exam 05/2022.  She was admitted with CP 05/2016 (found to have hypokalemia, mm cramps). On omeprazole -this is helping her GERD. She was in the emergency room with abd. pain 04/04/2021.  She was found to have kidney stones.  She tested positive for celiac disease.  She sees GI.  ROS: +  See HPI  I reviewed pt's medications, allergies, PMH, social hx, family hx, and changes were documented in the history of present illness. Otherwise, unchanged from my initial visit note.  Past Medical History:  Diagnosis Date   Anxiety    Bipolar disorder ([place])    Chest pain    a. 01/2003 MV w/ ? ant-inflat ischemia-->Cath: LAD 20p, otw nonobs dzs-->Med Rx; b. 2009 Cath: LAD 30, LCX nl/nondominant, RCA 30, nl EF; c. 08/2014 MV: no ischemia/infarct.   Complication of anesthesia    pt. states she is difficult to wake up   Depression    Diabetes mellitus    Type II   Diabetic neuropathy ([facility])    Diverticulitis    GERD (gastroesophageal reflux disease)    Headache    History of bronchitis    Hypercholesteremia    Hypertension    Hypokalemia    PONV (postoperative nausea and vomiting)    Shortness of breath dyspnea    Sleep apnea    Vertigo    patient reported   Past Surgical History:  Procedure Laterality Date   [facility]  2009   normal cath   CARDIAC CATHETERIZATION  2004   normal cath   CHOLECYSTECTOMY  1994   TONSILLECTOMY     TONSILLECTOMY/ADENOIDECTOMY/TURBINATE REDUCTION Bilateral 03/01/2015   Procedure: TONSILLECTOMY/TURBINATE REDUCTION;  Surgeon: Rozetta Nunnery, MD;  Location: [place];  Service: ENT;  Laterality: Bilateral;   UVULOPALATOPHARYNGOPLASTY Bilateral 03/01/2015   Procedure: UVULOPALATOPHARYNGOPLASTY (UPPP);  Surgeon: Rozetta Nunnery, MD;  Location: [place];  Service: ENT;  Laterality: Bilateral;   WISDOM TOOTH EXTRACTION     Social History   Social History   Marital status: Divorced    Spouse name: N/A   Number of children: 2   Occupational History   Disability    Social History Main Topics   Smoking status: Former Smoker    Quit date: 08/07/1982   Smokeless tobacco: Never Used   Alcohol use Yes     Comment: Drinks during the weekends 1 beer or 1 drink of liquor per day     Drug use: No   Sexual activity: Yes    Partners: Male    Birth control/ protection: Surgical   Current Outpatient Medications on File Prior to Visit  Medication Sig Dispense Refill   ACCU-CHEK GUIDE test strip USE AS INSTRUCTED TO CHECK BLOOD SUGAR 2X DAILY 200 strip 3   amLODipine (NORVASC) 5 MG tablet TAKE 1 TABLET (5 MG TOTAL) BY MOUTH DAILY. 90 tablet 3   aspirin EC 81 MG tablet Take 81 mg by mouth daily.     atorvastatin (LIPITOR) 40 MG  tablet Take 1 tablet (40 mg total) by mouth daily. 90 tablet 3   Biotin 10000 MCG TABS Take by mouth.     Blood Glucose Monitoring Suppl (TRUE METRIX AIR GLUCOSE METER) w/Device KIT Use as instructed to check blood sugar daly 1 kit 0   celecoxib (CELEBREX) 200 MG capsule Take 1 capsule (200 mg total) by mouth daily as needed. 90 capsule 1   FARXIGA 5 MG TABS tablet TAKE 1 TABLET BY MOUTH DAILY BEFORE BREAKFAST. 90 tablet 3   fluticasone (FLONASE) 50 MCG/ACT nasal spray Place 1 spray into both nostrils daily. 11.1 mL 2   gabapentin (NEURONTIN) 300 MG capsule Take 1 capsule (300 mg total) by mouth 3 (three) times daily. Start at 1 capsule at bedtime for a week, may add  1 capsule at dinner for a total of 2 caps for another week.  May then add 1 cap in am for a total of 3  caps daily. 90 capsule 3   Metamucil Fiber CHEW Chew by mouth.     olmesartan-hydrochlorothiazide (BENICAR HCT) 20-12.5 MG tablet Take 1 tablet by mouth daily. 30 tablet 1   Semaglutide, 1 MG/DOSE, (OZEMPIC, 1 MG/DOSE,) 4 MG/3ML SOPN INJECT 1MG INTO THE SKIN ONCE A WEEK 9 mL 0   valACYclovir (VALTREX) 500 MG tablet Take 500 mg by mouth See admin instructions. Takes Mon,Tues,Wed bid     No current facility-administered medications on file prior to visit.   Allergies  Allergen Reactions   Other Other (See Comments)    No Blood Products, personal preference   Latex Itching and Rash   Family History  Problem Relation Age of Onset   Diabetes Mother    Gout Mother    Hypertension  Mother    Arthritis Mother    Heart disease Father    Heart failure Sister    Arthritis Sister    Heart disease Maternal Grandmother    Diabetes Maternal Grandmother    Heart disease Paternal Grandmother    Diabetes Paternal Grandmother    Diabetes Sister    Bursitis Sister    Crohn's disease Sister    PE: BP 108/72 (BP Location: Right Arm, Patient Position: Sitting, Cuff Size: Normal)   Pulse 92   Ht _0  (1.676 m)   Wt 177 lb 6.4 oz (80.5 kg)   SpO2 91%   BMI 28.63 kg/m  Wt Readings from Last 3 Encounters:  06/13/22 177 lb 6.4 oz (80.5 kg)  05/26/22 176 lb 6.4 oz (80 kg)  05/18/22 174 lb (78.9 kg)   Constitutional: overweight, in NAD Eyes:  EOMI, no exophthalmos ENT: no neck masses, no cervical lymphadenopathy Cardiovascular: RRR, No MRG Respiratory: CTA B Musculoskeletal: no deformities Skin:no rashes Neurological: no tremor with outstretched hands  ASSESSMENT: 1. DM2, insulin-independent now, uncontrolled, with complications - CAD - PN  2. HL  3.  Obesity class I  PLAN:  1. Patient with longstanding, uncontrolled, type 2 diabetes, improving after changing GLP-1 receptor agonist, from Victoza to [place].  She also continues on SGLT2 inhibitor.  We were able to stop sulfonylurea.   -At the last visit, HbA1c was 6.6%, improved.  At that time, sugars were at goal, much improved.  She had a decreased appetite but otherwise she was tolerating Ozempic well.  We did not change her regimen. -At today's visit, sugars are higher.  Upon questioning, she relaxed her diet since last visit and now eats more sweets and drinks sweet tea.  I strongly advised her  to stop drinking sweet tea and to reduce the sweets.  We did discuss about possibly increasing the Farxiga dose, but decided to hold off for now since she would like to start working on her diet.  On this regimen, she had excellent blood sugars in the past. - I suggested to:  Patient Instructions  Please continue: -  Farxiga 5 mg daily before breakfast - Ozempic 1 mg weekly   Please return in 4 months with your sugar log.   - we checked her HbA1c: 7.6% (higher) - advised to check sugars at different times of the day - 1x a day, rotating check times - advised for yearly eye exams >> she is UTD - return to clinic in 4 months  2. HL - Reviewed latest lipid panel from last visit, LDL above our target of less than 55 due to cardiovascular history, higher than before, triglycerides also high. Lab Results  Component Value Date   CHOL 168 02/08/2022   HDL 47 02/08/2022   LDLCALC 74 02/08/2022   LDLDIRECT 92 04/17/2022   TRIG 292 (H) 02/08/2022   CHOLHDL 3.6 02/08/2022  -Currently on Lipitor 40 mg daily, previously on Crestor.  3.  Obesity class I -continue SGLT 2 inhibitor and GLP-1 receptor agonist which should also help with weight loss -She lost 10 pounds before the last 2 visits combined -Weight is stable at today's visit  Philemon Kingdom, MD PhD [facility]

## 2022-06-18 NOTE — Progress Notes (Unsigned)
  SUBJECTIVE:   CHIEF COMPLAINT / HPI:   Hypertension: BP: 104/62 today. Home medications include: Amlodipine 5 mg daily, olmesartan/HCTZ 20-12.5 mg daily. She endorses taking these medications as prescribed. Does check blood pressure at home, ranges in the 100s/60s. Patient has had a BMP in the past 1 year.  Healthcare maintenance: Due for mammogram, DEXA scan, Tdap, Pneumovax, Shingrix, COVID-vaccine booster, hepatitis C screening.  PERTINENT  PMH / PSH: T2DM, HTN, OSA on CPAP, CAD, RBBB  OBJECTIVE:  BP 104/62   Pulse 77   Wt 175 lb (79.4 kg)   SpO2 94%   BMI 28.25 kg/m  General: Awake, alert, NAD CV: RRR, no murmurs auscultated Pulm: CTA B, normal WOB  ASSESSMENT/PLAN:  Essential hypertension Assessment & Plan: BP: 104/62 today. Well controlled. Goal of <130/80. Follow up in 6 months.  Medication regimen: Rx for combination pill of olmesartan-amlodipine-HCTZ 20-5-12.5 mg daily  Orders: -     Basic metabolic panel -     Olmesartan-amLODIPine-HCTZ; Take 1 tablet by mouth daily.  Dispense: 90 tablet; Refill: 1  Breast cancer screening by mammogram -     3D Screening Mammogram, Left and Right; Future  Osteoporosis screening -     DG Bone Density; Future  Need for hepatitis C screening test -     Hepatitis C antibody  Prescription for Tdap. Vaccine not administered in office.  -     Tetanus-Diphth-Acell Pertussis; Inject 0.5 mLs into the muscle once for 1 dose.  Dispense: 0.5 mL; Refill: 0  Encounter for immunization [employer] Fall 2023 Covid-19 Vaccine 40yrs and older -     Pneumococcal conjugate vaccine 20-valent  Return in about 6 months (around 12/19/2022) for Hypertension follow-up. [name], DO 06/20/2022, 10:58 AM PGY-2, [facility]

## 2022-06-20 ENCOUNTER — Encounter: Payer: Self-pay | Admitting: Student

## 2022-06-20 ENCOUNTER — Ambulatory Visit (INDEPENDENT_AMBULATORY_CARE_PROVIDER_SITE_OTHER): Payer: Medicare HMO | Admitting: Student

## 2022-06-20 VITALS — BP 104/62 | HR 77 | Wt 175.0 lb

## 2022-06-20 DIAGNOSIS — Z1231 Encounter for screening mammogram for malignant neoplasm of breast: Secondary | ICD-10-CM

## 2022-06-20 DIAGNOSIS — Z13820 Encounter for screening for osteoporosis: Secondary | ICD-10-CM

## 2022-06-20 DIAGNOSIS — Z1159 Encounter for screening for other viral diseases: Secondary | ICD-10-CM

## 2022-06-20 DIAGNOSIS — I10 Essential (primary) hypertension: Secondary | ICD-10-CM | POA: Diagnosis not present

## 2022-06-20 DIAGNOSIS — Z23 Encounter for immunization: Secondary | ICD-10-CM | POA: Diagnosis not present

## 2022-06-20 MED ORDER — OLMESARTAN-AMLODIPINE-HCTZ 20-5-12.5 MG PO TABS: 1.0000 | ORAL_TABLET | Freq: Every day | ORAL | 1 refills | Status: DC

## 2022-06-20 MED ORDER — TETANUS-DIPHTH-ACELL PERTUSSIS 5-2.5-18.5 LF-MCG/0.5 IM SUSY: 0.5000 mL | PREFILLED_SYRINGE | Freq: Once | INTRAMUSCULAR | 0 refills | Status: AC

## 2022-06-20 NOTE — Assessment & Plan Note (Signed)
BP: 104/62 today. Well controlled. Goal of <130/80. Follow up in 6 months.  Medication regimen: Rx for combination pill of olmesartan-amlodipine-HCTZ 20-5-12.5 mg daily

## 2022-06-20 NOTE — Patient Instructions (Signed)
It was great to see you today! Thank you for choosing [facility] for your primary care. Bonnie Gordon was seen for hypertension.  Today we addressed: Hypertension: Blood pressure looks great.  I am prescribing a combo pill for all 3 of your blood pressure medications.  You may stop taking the individual tabs.  We are checking some blood work to make sure your potassium level is not too high. Healthcare maintenance: I am checking a hepatitis screening lab on you.  I have placed referrals for mammogram and a DEXA scan to screen for breast cancer and osteoporosis, respectively.  You got your COVID-vaccine and pneumonia vaccine today.  I have placed a prescription to get your Tdap vaccine at your pharmacy.  I do recommend you also get the shingles vaccine but that is up to you.  If you haven't already, sign up for My Chart to have easy access to your labs results, and communication with your primary care physician.  We are checking some labs today. If they are abnormal, I will call you. If they are normal, I will send you a MyChart message (if it is active) or a letter in the mail. If you do not hear about your labs in the next 2 weeks, please call the office. I recommend that you always bring your medications to each appointment as this makes it easy to ensure you are on the correct medications and helps [place] not miss refills when you need them. Call the clinic at [phone] if your symptoms worsen or you have any concerns.  You should return to our clinic Return in about 6 months (around 12/19/2022) for Hypertension follow-up. Please arrive 15 minutes before your appointment to ensure smooth check in process.  We appreciate your efforts in making this happen.  Thank you for allowing me to participate in your care, [name], DO 06/20/2022, 10:47 AM PGY-2, [facility]

## 2022-06-21 LAB — BASIC METABOLIC PANEL
BUN/Creatinine Ratio: 14 (ref 12–28)
BUN: 10 mg/dL (ref 8–27)
CO2: 29 mmol/L (ref 20–29)
Calcium: 9.6 mg/dL (ref 8.7–10.3)
Chloride: 99 mmol/L (ref 96–106)
Creatinine, Ser: 0.73 mg/dL (ref 0.57–1.00)
Glucose: 96 mg/dL (ref 70–99)
Potassium: 3.2 mmol/L — ABNORMAL LOW (ref 3.5–5.2)
Sodium: 143 mmol/L (ref 134–144)
eGFR: 91 mL/min/{1.73_m2} (ref >59)

## 2022-06-21 LAB — HEPATITIS C ANTIBODY: Hep C Virus Ab: NONREACTIVE

## 2022-06-27 ENCOUNTER — Ambulatory Visit (INDEPENDENT_AMBULATORY_CARE_PROVIDER_SITE_OTHER): Payer: Medicare HMO | Admitting: Podiatry

## 2022-06-27 DIAGNOSIS — G5793 Unspecified mononeuropathy of bilateral lower limbs: Secondary | ICD-10-CM | POA: Diagnosis not present

## 2022-06-27 NOTE — Progress Notes (Signed)
  Subjective:  Patient ID: Bonnie Gordon, female    DOB: [birth year],  MRN: [id number]  Chief Complaint  Patient presents with   Peripheral Neuropathy    4-6 weeks for recheck of gabapentin, seems to be helping / foot pain - both feet are cramping at times    66 y.o. female presents with the above complaint. History confirmed with patient.  She does note some cramping as well  Objective:  Physical Exam: warm, good capillary refill, no trophic changes or ulcerative lesions, and normal DP and PT pulses. Assessment:   1. Neuropathy of both feet      Plan:  Patient was evaluated and treated and all questions answered.  Gabapentin helpful for the neuropathy.  Discussed could add a third dose during the afternoon for 3 to milligrams 3 times daily now.  Discussed also that there is a wide dose range of gabapentin and she may increase this gradually and will let us know if she is in need of this or refills.  Regarding her cramping she is having she says her potassium has been low and she will discuss this with her PCP.  Also discussed using tonic water or quinine containing supplements that can alleviate this.  Return if symptoms worsen or fail to improve.

## 2022-06-27 NOTE — Patient Instructions (Signed)
In 1-2 weeks you can add a third 300mg  dose in the mid day or afternoon. Let know if you need a refill or higher dose. Talk to your primary care doctor about your potassium and magnesium in regards to the cramping

## 2022-07-10 ENCOUNTER — Telehealth: Payer: Self-pay | Admitting: Pharmacist

## 2022-07-10 ENCOUNTER — Telehealth: Payer: Self-pay | Admitting: Cardiovascular Disease

## 2022-07-10 NOTE — Telephone Encounter (Signed)
Contacted patient to assess need for sample Ozempic 1mg  pen that was labeled for pick-up at the [facility] and in the refrigerator for several weeks.   Patient reports doing well with excellent blood sugar control.   Asked patient to pick-up medication at earliest convenience.   Patient thanked me for the notification.

## 2022-07-10 NOTE — Telephone Encounter (Signed)
Received a call from patient she stated she has been having chest pain off and on for the past 1 month.No pain at present.Appointment scheduled with Marjie Skiff PA 12/7 at 8:50 am.Advised to go to ED if chest pain worsens.

## 2022-07-10 NOTE — Telephone Encounter (Signed)
Pt c/o of Chest Pain: STAT if CP now or developed within 24 hours  1. Are you having CP right now? Yes, pressure  2. Are you experiencing any other symptoms (ex. SOB, nausea, vomiting, sweating)? SOB  3. How long have you been experiencing CP? A month  4. Is your CP continuous or coming and going? Comes and goes  5. Have you taken Nitroglycerin? no ?   Patient states she has been having chest pain for about a month off and on. She says right now she is having chest pressure. She says she was given medication, but doesn't think it's helping.

## 2022-07-10 NOTE — Progress Notes (Unsigned)
Cardiology Office Note:    Date:  07/10/2022   ID:  Bonnie Gordon, DOB August 14, 1955, MRN [id number]  PCP:  [name], DO  Cardiologist:  Nicki Guadalajara, MD  Electrophysiologist:  None   Referring MD: [name], DO   Chief Complaint: chest pain  History of Present Illness:    Bonnie Gordon is a 66 y.o. female with a history of chest pain and dyspnea on exertion with non-obstructive CAD noted on multiple cardiac catheterizations in the past and coronary CTA in 03/2019, hypertension, hyperlipidemia, type 2 diabetes mellitus, GERD, obstructive sleep apnea, and bipolar disorder who is followed by Dr. Tresa Endo and presents today for evaluation of chest pain.   Patient has a long history of atypical chest pain and shortness of breath and has had multiple ischemic evaluations (including nuclear stress test, coronary CTA, and cardiac catheterizations in the past). Cardiac catheterizations in 2004 and 2009 both showed mild non-obstructive disease. Myoviews in 2106 and 2017 showed no evidence of ischemia. Last ischemic evaluation was a coronary CTA in 03/2019 which showed a coronary calcium score of 92 (58th percentile for age and sex) and mild non-obstructive CAD (35-49% stenosis) in the proximal portions of the LAD, ramus intermedius, and LCX arteries.   Patient was last seen by Dr. Tresa Endo in 01/2022 at which time she continued to report atypical chest pain as well as shortness of breath particularly when she leans over. She reported being under a great deal of stress at that time due to her husband's early dementia. Echo was ordered for further evaluation of shortness of breath and showed LVEF of 60-65% with no regional wall motion abnormalities, mild asymmetric LVH of the basal-septal segment, and grade 1 diastolic dysfunction.   She called our office on 07/10/2022 with reports of chest pain over the last month. Therefore, this visit was arranged for further evaluation. ***  Chest  Pain Non-Obstructive CAD Patient has a long history of atypical chest pain and has had multiple ischemic evaluations dating back to 2004. Remote cardiac catheterizations in 2004 and 2009 showed mild non-obstructive disease. Last ischemic evaluation was a coronary CTA in 03/2019 which showed a coronary calcium score of 92 (58th percentile for age and sex) and mild non-obstructive CAD (35-49% stenosis) in the proximal portions of the LAD, ramus intermedius, and LCX arteries.  - *** - EKG shows *** - Continue Amlodipine (take Olmesartan-Amlodipine-HCTZ combination pill).  - Continue aspirin and high-intensity statin.  - ***  Dyspnea Patient has a history of shortness of breath. Cardiac work-up has been unremarkable. No significant CAD on prior cardiac catheterizations or coronary CTA as above. Recent Echo in 02/2022 showed  LVEF of 60-65% with no regional wall motion abnormalities, mild asymmetric LVH of the basal-septal segment, and grade 1 diastolic dysfunction.  - ***  Hypertension BP well controlled.  - Continue current medications: Olmesartan-Amlodipine-HCTZ 20-5-12.5mg  daily.   Hyperlipidemia Lipid panel in 02/2022: Total Cholesterol 168, Triglycerides 292, HDL 47, LDL 74. Direct LDL 92. LDL goal <70 given CAD. - Lipitor was increased to 40mg  daily at time of last check.  - She is due for updated lipid panel and LFTs. ***  Type 2 Diabetes Mellitus Hemoglobin A1c 7.6% on 06/13/2022. - On Farxiga and Ozempic.  - Management per PCP.  Obstructive Sleep Apnea She has known very mild sleep study on sleep study in 2019. Recommendation was that if she gained more weight or became more sleepy a repeat study would be warranted.  - ***  Past Medical  History:  Diagnosis Date   Anxiety    Bipolar disorder (HCC)    Chest pain    a. 01/2003 MV w/ ? ant-inflat ischemia-->Cath: LAD 20p, otw nonobs dzs-->Med Rx; b. 2009 Cath: LAD 30, LCX nl/nondominant, RCA 30, nl EF; c. 08/2014 MV: no  ischemia/infarct.   Complication of anesthesia    pt. states she is difficult to wake up   Depression    Diabetes mellitus    Type II   Diabetic neuropathy (HCC)    Diverticulitis    GERD (gastroesophageal reflux disease)    Headache    History of bronchitis    Hypercholesteremia    Hypertension    Hypokalemia    PONV (postoperative nausea and vomiting)    Shortness of breath dyspnea    Sleep apnea    Vertigo    patient reported    Past Surgical History:  Procedure Laterality Date   ABDOMINAL HYSTERECTOMY  1996   APPENDECTOMY  1990   CARDIAC CATHETERIZATION  2009   normal cath   CARDIAC CATHETERIZATION  2004   normal cath   CHOLECYSTECTOMY  1994   TONSILLECTOMY     TONSILLECTOMY/ADENOIDECTOMY/TURBINATE REDUCTION Bilateral 03/01/2015   Procedure: TONSILLECTOMY/TURBINATE REDUCTION;  Surgeon: Drema Halon, MD;  Location: [facility] OR;  Service: ENT;  Laterality: Bilateral;   UVULOPALATOPHARYNGOPLASTY Bilateral 03/01/2015   Procedure: UVULOPALATOPHARYNGOPLASTY (UPPP);  Surgeon: Drema Halon, MD;  Location: [facility] OR;  Service: ENT;  Laterality: Bilateral;   WISDOM TOOTH EXTRACTION      Current Medications: No outpatient medications have been marked as taking for the 07/13/22 encounter (Appointment) with Corrin Parker, PA-C.     Allergies:   Other and Latex   Social History   Socioeconomic History   Marital status: Married    Spouse name: Not on file   Number of children: Not on file   Years of education: some college   Highest education level: Not on file  Occupational History   Occupation: Retired Lawyer  Tobacco Use   Smoking status: Former    Types: Cigarettes    Quit date: 08/07/1982    Years since quitting: 39.9    Passive exposure: Past   Smokeless tobacco: Never  Vaping Use   Vaping Use: Never used  Substance and Sexual Activity   Alcohol use: Yes    Comment: occasional / social   Drug use: No   Sexual activity: Yes    Partners: Male     Birth control/protection: Surgical  Other Topics Concern   Not on file  Social History Narrative   Lives at home alone.   Right-handed.    1 cup caffeine per day.   Social Determinants of Health   Financial Resource Strain: Not on file  Food Insecurity: Not on file  Transportation Needs: Not on file  Physical Activity: Not on file  Stress: Not on file  Social Connections: Not on file     Family History: The patient's family history includes Arthritis in her mother and sister; Bursitis in her sister; Crohn's disease in her sister; Diabetes in her maternal grandmother, mother, paternal grandmother, and sister; Gout in her mother; Heart disease in her father, maternal grandmother, and paternal grandmother; Heart failure in her sister; Hypertension in her mother.  ROS:   Please see the history of present illness.     EKGs/Labs/Other Studies Reviewed:    The following studies were reviewed:  Coronary CTA 04/07/2019: Impressions:  1. Coronary calcium score of 92. This was  58 percentile for age and sex matched control. 2. Normal coronary origin with right dominance. 3. CAD-RADS 2. Mild non-obstructive CAD (25-49%) in the proximal portions of LAD, ramus intermedius and LCX arteries. Consider non-atherosclerotic causes of chest pain. Consider preventive therapy and risk factor modification. _______________  Echocardiogram 02/15/2022: Impressions:   1. Left ventricular ejection fraction, by estimation, is 60 to 65%. The  left ventricle has normal function. The left ventricle has no regional  wall motion abnormalities. There is mild asymmetric left ventricular  hypertrophy of the basal-septal segment.  Left ventricular diastolic parameters are consistent with Grade I  diastolic dysfunction (impaired relaxation). The average left ventricular  global longitudinal strain is -20.8 %. The global longitudinal strain is  normal.   2. Right ventricular systolic function is normal. The  right ventricular  size is normal. There is normal pulmonary artery systolic pressure.   3. The mitral valve is grossly normal. No evidence of mitral valve  regurgitation.   4. Aortic valve regurgitation is not visualized. Aortic valve sclerosis  is present, with no evidence of aortic valve stenosis.   5. Aortic no significant aortic root/ascending aortic aneurysm.   6. The inferior vena cava is normal in size with greater than 50%  respiratory variability, suggesting right atrial pressure of 3 mmHg.   Comparison(s): No prior Echocardiogram.    EKG:  EKG ordered today. EKG personally reviewed and demonstrates ***.  Recent Labs: 02/14/2022: ALT 17; Hemoglobin 14.5; Platelets 194 06/20/2022: BUN 10; Creatinine, Ser 0.73; Potassium 3.2; Sodium 143  Recent Lipid Panel    Component Value Date/Time   CHOL 168 02/08/2022 0922   TRIG 292 (H) 02/08/2022 0922   HDL 47 02/08/2022 0922   CHOLHDL 3.6 02/08/2022 0922   CHOLHDL 2.1 02/29/2016 0856   VLDL 19 02/29/2016 0856   LDLCALC 74 02/08/2022 0922   LDLDIRECT 92 04/17/2022 0931    Physical Exam:    Vital Signs: There were no vitals taken for this visit.    Wt Readings from Last 3 Encounters:  06/20/22 175 lb (79.4 kg)  06/13/22 177 lb 6.4 oz (80.5 kg)  05/26/22 176 lb 6.4 oz (80 kg)     General: 66 y.o. female in no acute distress. HEENT: Normocephalic and atraumatic. Sclera clear. EOMs intact. Neck: Supple. No carotid bruits. No JVD. Heart: *** RRR. Distinct S1 and S2. No murmurs, gallops, or rubs. Radial and distal pedal pulses 2+ and equal bilaterally. Lungs: No increased work of breathing. Clear to ausculation bilaterally. No wheezes, rhonchi, or rales.  Abdomen: Soft, non-distended, and non-tender to palpation. Bowel sounds present in all 4 quadrants.  MSK: Normal strength and tone for age. *** Extremities: No lower extremity edema.    Skin: Warm and dry. Neuro: Alert and oriented x3. No focal deficits. Psych: Normal  affect. Responds appropriately.   Assessment:    No diagnosis found.  Plan:     Disposition: Follow up in ***   Medication Adjustments/Labs and Tests Ordered: Current medicines are reviewed at length with the patient today.  Concerns regarding medicines are outlined above.  No orders of the defined types were placed in this encounter.  No orders of the defined types were placed in this encounter.   There are no Patient Instructions on file for this visit.   Signed, Corrin Parker, PA-C  07/10/2022 7:35 PM    [facility]

## 2022-07-11 NOTE — Progress Notes (Addendum)
Cardiology Clinic Note   Patient Name: Bonnie Gordon Date of Encounter: 07/13/2022  Primary Care Provider:  Wells Guiles, DO Primary Cardiologist:  Shelva Majestic, MD  Patient Profile    Bonnie Gordon is a 66 y.o. female with a past medical history of nonobstructive CAD per [facility] 2004 and 2009, hypertension, hyperlipidemia, T2DM with peripheral neuropathy, GERD, and asthma who presents to the clinic today for evaluation of chest pain.   Past Medical History    Past Medical History:  Diagnosis Date   Anxiety    Bipolar disorder ([place])    Chest pain    a. 01/2003 MV w/ ? ant-inflat ischemia-->Cath: LAD 20p, otw nonobs dzs-->Med Rx; b. 2009 Cath: LAD 30, LCX nl/nondominant, RCA 30, nl EF; c. 08/2014 MV: no ischemia/infarct.   Complication of anesthesia    pt. states she is difficult to wake up   Depression    Diabetes mellitus    Type II   Diabetic neuropathy (HCC)    Diverticulitis    GERD (gastroesophageal reflux disease)    Headache    History of bronchitis    Hypercholesteremia    Hypertension    Hypokalemia    PONV (postoperative nausea and vomiting)    Shortness of breath dyspnea    Sleep apnea    Vertigo    patient reported   Past Surgical History:  Procedure Laterality Date   [place]  2009   normal cath   CARDIAC CATHETERIZATION  2004   normal cath   CHOLECYSTECTOMY  1994   TONSILLECTOMY     TONSILLECTOMY/ADENOIDECTOMY/TURBINATE REDUCTION Bilateral 03/01/2015   Procedure: TONSILLECTOMY/TURBINATE REDUCTION;  Surgeon: Rozetta Nunnery, MD;  Location: [place];  Service: ENT;  Laterality: Bilateral;   UVULOPALATOPHARYNGOPLASTY Bilateral 03/01/2015   Procedure: UVULOPALATOPHARYNGOPLASTY (UPPP);  Surgeon: Rozetta Nunnery, MD;  Location: [facility] OR;  Service: ENT;  Laterality: Bilateral;   WISDOM TOOTH EXTRACTION      Allergies  Allergies  Allergen Reactions   Other Other (See  Comments)    No Blood Products, personal preference   Latex Itching and Rash    History of Present Illness    Bonnie Gordon has a past medical history of: Nonobstructive CAD. LHC 01/16/2003: 10-20% focal narrowing LAD. LHC 03/24/2008: Proximal LAD 30%, Mid RCA 30%. Nuclear stress test 03/09/2016: Normal, low risk study. Coronary CT 04/07/2019: Calcium score 92, 58 percentile. Mild (25-49%) nonobstructive CAD proximal LAD, RI and LCx. Hypertension.  Hyperlipidemia.  T2DM. GERD.  Asthma.   [name] is a long time patient of Dr. Claiborne Billings since 2004 followed for CAD. She was last seen in the office on 01/31/2022 by Dr. Claiborne Billings. At that time she complained of increased stress secondary to her husband having early onset dementia. No medication changes were made at this visit. She had an echo secondary to history of shortness of breath which showed an EF of 60-65%, mild asymmetric LVH in the basal septal segment, grade I DD and mild aortic sclerosis.   Today, patient reports continued episodes of chest pain and shortness of breath. Pain is described as non-radiating in the center of the chest  with associated shortness of breath occurring both at rest and with exertion and lasting anywhere from a few minutes to over an hour. Pain is not worsened with activity. This pain is the same as pain she has had in the past. Chest pain can occur during times of increased stress but  also at other times. She also reports palpitations occurring at least twice a week while at rest. Feels like racing lasting five minutes and resolves on it's own. These episodes are separate from episodes of chest pain. She endorses an increased amount of stress. She works part time and cares for her husband who has dementia.   Left leg is noted to be slightly larger when compared to the right during physical exam. Patient reports this is normal for her. She had an accident years ago and believes she injured the left leg. She has not  traveled recently.  It does not appear edematous. She reports neuropathy bilateral lower extremities left > right. No claudication reported. Feel this is stable and normal for her No further workup required.     Home Medications    Current Meds  Medication Sig   ACCU-CHEK GUIDE test strip USE AS INSTRUCTED TO CHECK BLOOD SUGAR 2X DAILY   aspirin EC 81 MG tablet Take 81 mg by mouth daily.   atorvastatin (LIPITOR) 40 MG tablet Take 1 tablet (40 mg total) by mouth daily.   Biotin 10000 MCG TABS Take by mouth.   Blood Glucose Monitoring Suppl (TRUE METRIX AIR GLUCOSE METER) w/Device KIT Use as instructed to check blood sugar daly   celecoxib (CELEBREX) 200 MG capsule Take 1 capsule (200 mg total) by mouth daily as needed.   FARXIGA 5 MG TABS tablet TAKE 1 TABLET BY MOUTH DAILY BEFORE BREAKFAST.   fluticasone (FLONASE) 50 MCG/ACT nasal spray Place 1 spray into both nostrils daily.   gabapentin (NEURONTIN) 300 MG capsule Take 1 capsule (300 mg total) by mouth 3 (three) times daily. Start at 1 capsule at bedtime for a week, may add  1 capsule at dinner for a total of 2 caps for another week.  May then add 1 cap in am for a total of 3  caps daily.   Metamucil Fiber CHEW Chew by mouth.   Olmesartan-amLODIPine-HCTZ 20-5-12.5 MG TABS Take 1 tablet by mouth daily.   Semaglutide, 1 MG/DOSE, (OZEMPIC, 1 MG/DOSE,) 4 MG/3ML SOPN INJECT 1MG INTO THE SKIN ONCE A WEEK   valACYclovir (VALTREX) 500 MG tablet Take 500 mg by mouth See admin instructions. Takes Mon,Tues,Wed bid    Family History    Family History  Problem Relation Age of Onset   Diabetes Mother    Gout Mother    Hypertension Mother    Arthritis Mother    Heart disease Father    Heart failure Sister    Arthritis Sister    Heart disease Maternal Grandmother    Diabetes Maternal Grandmother    Heart disease Paternal Grandmother    Diabetes Paternal Grandmother    Diabetes Sister    Bursitis Sister    Crohn's disease Sister    She  indicated that her mother is deceased. She indicated that her father is deceased. She indicated that all of her three sisters are alive. She indicated that her maternal grandmother is deceased. She indicated that her maternal grandfather is deceased. She indicated that her paternal grandmother is deceased. She indicated that her paternal grandfather is deceased.   Social History    Social History   Socioeconomic History   Marital status: Married    Spouse name: Not on file   Number of children: Not on file   Years of education: some college   Highest education level: Not on file  Occupational History   Occupation: Retired Quarry manager  Tobacco Use   Smoking  status: Former    Types: Cigarettes    Quit date: 08/07/1982    Years since quitting: 39.9    Passive exposure: Past   Smokeless tobacco: Never  Vaping Use   Vaping Use: Never used  Substance and Sexual Activity   Alcohol use: Yes    Comment: occasional / social   Drug use: No   Sexual activity: Yes    Partners: Male    Birth control/protection: Surgical  Other Topics Concern   Not on file  Social History Narrative   Lives at home alone.   Right-handed.    1 cup caffeine per day.   Social Determinants of Health   Financial Resource Strain: Not on file  Food Insecurity: Not on file  Transportation Needs: Not on file  Physical Activity: Not on file  Stress: Not on file  Social Connections: Not on file  Intimate Partner Violence: Not on file     Review of Systems    General: No chills, fever, night sweats or weight changes.  Cardiovascular:  No  dyspnea on exertion, edema, orthopnea, paroxysmal nocturnal dyspnea. Positive chest pain and shortness of breath. Positive for palpitations.  Dermatological: No rash, lesions/masses Respiratory: No cough, dyspnea Urologic: No hematuria, dysuria Abdominal:   No nausea, vomiting, diarrhea, bright red blood per rectum, melena, or hematemesis Neurologic:  No visual changes, weakness,  changes in mental status. All other systems reviewed and are otherwise negative except as noted above.  Physical Exam    VS:  BP 128/72   Pulse 83   Ht _0  (1.676 m)   Wt 175 lb 12.8 oz (79.7 kg)   SpO2 98%   BMI 28.37 kg/m  , BMI Body mass index is 28.37 kg/m. GEN: Well nourished, well developed, in no acute distress. HEENT: Normal. Neck: Supple, no JVD, carotid bruits, or masses. Cardiac: RRR, no murmurs, rubs, or gallops. No clubbing, cyanosis, edema.  Radials/DP/PT 2+ and equal bilaterally.  Respiratory:  Respirations regular and unlabored, clear to auscultation bilaterally. GI: Soft, nontender, nondistended. MS: No deformity or atrophy. Skin: Warm and dry, no rash. Neuro: Strength and sensation are intact. Psych: Normal affect.  Accessory Clinical Findings   Recent Labs: 02/14/2022: ALT 17; Hemoglobin 14.5; Platelets 194 06/20/2022: BUN 10; Creatinine, Ser 0.73; Potassium 3.2; Sodium 143   Recent Lipid Panel    Component Value Date/Time   CHOL 168 02/08/2022 0922   TRIG 292 (H) 02/08/2022 0922   HDL 47 02/08/2022 0922   CHOLHDL 3.6 02/08/2022 0922   CHOLHDL 2.1 02/29/2016 0856   VLDL 19 02/29/2016 0856   LDLCALC 74 02/08/2022 0922   LDLDIRECT 92 04/17/2022 0931         ECG personally reviewed by me today: NSR, HR 83, RBBB  No significant changes from 01/31/2022      Assessment & Plan    Chest pain/Nonobstructive CAD. [place] 2004 and 2009 showed nonobstructive disease in LAD and RCA. Coronary CT 2020 mild 25-49% nonobstructive disease proximal LAD, RI and LCx. Calcium score 92. Patient has a history of chronic chest pain. Patient reports continued episodes of chest pain with associated shortness of breath. Pain is similar to past pain and seems chronic in nature and stable therefore will not start NTG or long acting nitrate at this time. It has been over three years since ischemic evaluation. Given persistence of symptoms, will order PET CT. This will also allow  [place] to assess for microvascular disease which could explain her chronic chest pain.  Shared Decision Making/Informed Consent The risks [chest pain, shortness of breath, cardiac arrhythmias, dizziness, blood pressure fluctuations, myocardial infarction, stroke/transient ischemic attack, nausea, vomiting, allergic reaction, radiation exposure, metallic taste sensation and life-threatening complications (estimated to be 1 in 10,000)], benefits (risk stratification, diagnosing coronary artery disease, treatment guidance) and alternatives of a cardiac PET stress test were discussed in detail with Ms. Mis and she agrees to proceed.  Palpitations. Patient reports episodes of heart racing that occur at least twice a week and last over five minutes. These episodes are separate from episodes of chest pain and not related to increased stress or activity. This is a new issue for her. Two week Zio for further evaluation. CMP, MG, and TSH today.  Hypertension. BP today 128/72.Patient was found to be hypokalemic per bloodwork from PCP. She was instructed to stop one medication (she cannot remember the name) and start olmesartan-amlodipine-HCTZ.  Patient denies headaches or dizziness. Educated patient on good sources of potassium through diet. Will check CMP today. Continue olmesartan-amlodipine-HCTZ 20-5-12.5 mg daily.  Hyperlipidemia. Direct LDL 04/17/2022 92, not at goal. Atorvastatin was increased at that time. She is fasting today. Will recheck lipid panel and LFTs.      Disposition: Lipid panel, CMP, MG, TSH today. PET CT. Two week ZIO. Return in 3-4 months after testing completed or sooner as needed.    Justice [place]. Ladell Bey, NP-C     07/13/2022, 9:08 AM [place] [address] Office [phone] Fax [phone]   I spent 12 minutes examining this patient, reviewing medications, and using patient centered shared decision making involving her cardiac care.   Prior to her visit I spent greater than 20 minutes reviewing her past medical history,  medications, and prior cardiac tests.

## 2022-07-13 ENCOUNTER — Ambulatory Visit: Payer: Medicare HMO | Attending: Student | Admitting: Student

## 2022-07-13 ENCOUNTER — Encounter: Payer: Self-pay | Admitting: Student

## 2022-07-13 ENCOUNTER — Ambulatory Visit: Payer: Medicare HMO | Attending: Student

## 2022-07-13 VITALS — BP 128/72 | HR 83 | Ht 66.0 in | Wt 175.8 lb

## 2022-07-13 DIAGNOSIS — R002 Palpitations: Secondary | ICD-10-CM

## 2022-07-13 DIAGNOSIS — E785 Hyperlipidemia, unspecified: Secondary | ICD-10-CM | POA: Diagnosis not present

## 2022-07-13 DIAGNOSIS — R072 Precordial pain: Secondary | ICD-10-CM

## 2022-07-13 DIAGNOSIS — I10 Essential (primary) hypertension: Secondary | ICD-10-CM

## 2022-07-13 NOTE — Patient Instructions (Addendum)
Medication Instructions:  Your physician recommends that you continue on your current medications as directed. Please refer to the Current Medication list given to you today.    *If you need a refill on your cardiac medications before your next appointment, please call your pharmacy*   Lab Work: Your physician recommends that you complete lab work today. Fasting Lipid panel CMP, Magnesium, TSH   If you have labs (blood work) drawn today and your tests are completely normal, you will receive your results only by: MyChart Message (if you have MyChart) OR A paper copy in the mail If you have any lab test that is abnormal or we need to change your treatment, we will call you to review the results.   Testing/Procedures: How to Prepare for Your Cardiac PET/CT Stress Test:  1. Please do not take these medications before your test:   Medications that may interfere with the cardiac pharmacological stress agent (ex. nitrates - including erectile dysfunction medications or beta-blockers) the day of the exam. (Erectile dysfunction medication should be held for at least 72 hrs prior to test) Theophylline containing medications for 12 hours. Dipyridamole 48 hours prior to the test. Your remaining medications may be taken with water.  2. Nothing to eat or drink, except water, 3 hours prior to arrival time.   NO caffeine/decaffeinated products, or chocolate 12 hours prior to arrival.  3. NO perfume, cologne or lotion  4. Total time is 1 to 2 hours; you may want to bring reading material for the waiting time.  5. Please report to Admitting at the [facility] Main Entrance 60 minutes early for your test.  [address] [place], [place] [address]  Diabetic Preparation:  Hold oral medications. You may take NPH and Lantus insulin. Do not take Humalog or Humulin R (Regular Insulin) the day of your test. Check blood sugars prior to leaving the house. If able to eat breakfast prior to  3 hour fasting, you may take all medications, including your insulin, Do not worry if you miss your breakfast dose of insulin - start at your next meal.  IF YOU THINK YOU MAY BE PREGNANT, OR ARE NURSING PLEASE INFORM THE TECHNOLOGIST.  In preparation for your appointment, medication and supplies will be purchased.  Appointment availability is limited, so if you need to cancel or reschedule, please call the Radiology Department at [phone]  24 hours in advance to avoid a cancellation fee of $100.00  What to Expect After you Arrive:  Once you arrive and check in for your appointment, you will be taken to a preparation room within the Radiology Department.  A technologist or Nurse will obtain your medical history, verify that you are correctly prepped for the exam, and explain the procedure.  Afterwards,  an IV will be started in your arm and electrodes will be placed on your skin for EKG monitoring during the stress portion of the exam. Then you will be escorted to the PET/CT scanner.  There, staff will get you positioned on the scanner and obtain a blood pressure and EKG.  During the exam, you will continue to be connected to the EKG and blood pressure machines.  A small, safe amount of a radioactive tracer will be injected in your IV to obtain a series of pictures of your heart along with an injection of a stress agent.    After your Exam:  It is recommended that you eat a meal and drink a caffeinated beverage to counter  act any effects of the stress agent.  Drink plenty of fluids for the remainder of the day and urinate frequently for the first couple of hours after the exam.  Your doctor will inform you of your test results within 7-10 business days.  For questions about your test or how to prepare for your test, please call: Rockwell Alexandria, Cardiac Imaging Nurse Navigator  Larey Brick, Cardiac Imaging Nurse Navigator Office: [phone]   ZIO XT- Long Term Monitor Instructions  Your  physician has requested you wear a ZIO patch monitor for 14 days.  This is a single patch monitor. [employer] supplies one patch monitor per enrollment. Additional stickers are not available. Please do not apply patch if you will be having a Nuclear Stress Test,  Echocardiogram, Cardiac CT, MRI, or Chest Xray during the period you would be wearing the  monitor. The patch cannot be worn during these tests. You cannot remove and re-apply the  ZIO XT patch monitor.  Your ZIO patch monitor will be mailed 3 day USPS to your address on file. It may take 3-5 days  to receive your monitor after you have been enrolled.  Once you have received your monitor, please review the enclosed instructions. Your monitor  has already been registered assigning a specific monitor serial # to you.  Billing and Patient Assistance Program Information  We have supplied [employer] with any of your insurance information on file for billing purposes. [employer] offers a sliding scale Patient Assistance Program for patients that do not have  insurance, or whose insurance does not completely cover the cost of the ZIO monitor.  You must apply for the Patient Assistance Program to qualify for this discounted rate.  To apply, please call [employer] at [phone], select option 4, select option 2, ask to apply for  Patient Assistance Program. Meredeth Ide will ask your household income, and how many people  are in your household. They will quote your out-of-pocket cost based on that information.  [employer] will also be able to set up a 45-month, interest-free payment plan if needed.  Applying the monitor   Shave hair from upper left chest.  Hold abrader disc by orange tab. Rub abrader in 40 strokes over the upper left chest as  indicated in your monitor instructions.  Clean area with 4 enclosed alcohol pads. Let dry.  Apply patch as indicated in monitor instructions. Patch will be placed under collarbone on left  side of chest with arrow  pointing upward.  Rub patch adhesive wings for 2 minutes. Remove white label marked "1". Remove the white  label marked "2". Rub patch adhesive wings for 2 additional minutes.  While looking in a mirror, press and release button in center of patch. A small green light will  flash 3-4 times. This will be your only indicator that the monitor has been turned on.  Do not shower for the first 24 hours. You may shower after the first 24 hours.  Press the button if you feel a symptom. You will hear a small click. Record Date, Time and  Symptom in the Patient Logbook.  When you are ready to remove the patch, follow instructions on the last 2 pages of Patient  Logbook. Stick patch monitor onto the last page of Patient Logbook.  Place Patient Logbook in the blue and white box. Use locking tab on box and tape box closed  securely. The blue and white box has prepaid postage on it. Please place it in the mailbox as  soon as possible. Your physician should have your test results approximately 7 days after the  monitor has been mailed back to [facility].  Call [facility] Customer Care at [phone] if you have questions regarding  your ZIO XT patch monitor. Call them immediately if you see an orange light blinking on your  monitor.  If your monitor falls off in less than 4 days, contact our Monitor department at [phone].  If your monitor becomes loose or falls off after 4 days call [employer] at [phone] for  suggestions on securing your monitor    Follow-Up: At [facility], you and your health needs are our priority.  As part of our continuing mission to provide you with exceptional heart care, we have created designated Provider Care Teams.  These Care Teams include your primary Cardiologist (physician) and Advanced Practice Providers (APPs -  Physician Assistants and Nurse Practitioners) who all work together to provide you with the care you need, when you need it.  We  recommend signing up for the patient portal called "MyChart".  Sign up information is provided on this After Visit Summary.  MyChart is used to connect with patients for Virtual Visits (Telemedicine).  Patients are able to view lab/test results, encounter notes, upcoming appointments, etc.  Non-urgent messages can be sent to your provider as well.   To learn more about what you can do with MyChart, go to [email or web address].    Your next appointment:   3-4 month(s)  The format for your next appointment:   In Person  Provider:   Carlos Levering, NP   Other Instructions   Important Information About Sugar

## 2022-07-13 NOTE — Progress Notes (Unsigned)
Enrolled patient for a 14 day Zio XT monitor to be mailed to patients home  Dr Kelly to read 

## 2022-07-14 LAB — COMPREHENSIVE METABOLIC PANEL
ALT: 18 IU/L (ref 0–32)
AST: 13 IU/L (ref 0–40)
Albumin/Globulin Ratio: 1.6 (ref 1.2–2.2)
Albumin: 4.4 g/dL (ref 3.9–4.9)
Alkaline Phosphatase: 158 IU/L — ABNORMAL HIGH (ref 44–121)
BUN/Creatinine Ratio: 11 — ABNORMAL LOW (ref 12–28)
BUN: 8 mg/dL (ref 8–27)
Bilirubin Total: 0.3 mg/dL (ref 0.0–1.2)
CO2: 26 mmol/L (ref 20–29)
Calcium: 9.6 mg/dL (ref 8.7–10.3)
Chloride: 102 mmol/L (ref 96–106)
Creatinine, Ser: 0.76 mg/dL (ref 0.57–1.00)
Globulin, Total: 2.8 g/dL (ref 1.5–4.5)
Glucose: 81 mg/dL (ref 70–99)
Potassium: 3.7 mmol/L (ref 3.5–5.2)
Sodium: 143 mmol/L (ref 134–144)
Total Protein: 7.2 g/dL (ref 6.0–8.5)
eGFR: 86 mL/min/{1.73_m2} (ref >59)

## 2022-07-14 LAB — LIPID PANEL
Chol/HDL Ratio: 2.7 ratio (ref 0.0–4.4)
Cholesterol, Total: 152 mg/dL (ref 100–199)
HDL: 57 mg/dL (ref >39)
LDL Chol Calc (NIH): 72 mg/dL (ref 0–99)
Triglycerides: 134 mg/dL (ref 0–149)
VLDL Cholesterol Cal: 23 mg/dL (ref 5–40)

## 2022-07-14 LAB — TSH: TSH: 1.03 u[IU]/mL (ref 0.450–4.500)

## 2022-07-14 LAB — MAGNESIUM: Magnesium: 2.2 mg/dL (ref 1.6–2.3)

## 2022-07-17 DIAGNOSIS — E785 Hyperlipidemia, unspecified: Secondary | ICD-10-CM | POA: Diagnosis not present

## 2022-07-17 DIAGNOSIS — R072 Precordial pain: Secondary | ICD-10-CM

## 2022-07-17 DIAGNOSIS — R002 Palpitations: Secondary | ICD-10-CM

## 2022-07-17 DIAGNOSIS — I10 Essential (primary) hypertension: Secondary | ICD-10-CM

## 2022-07-18 ENCOUNTER — Telehealth: Payer: Self-pay | Admitting: Cardiovascular Disease

## 2022-07-18 NOTE — Telephone Encounter (Signed)
Patient wants to know when she should schedule her PET scan - before or after ZIO monitor test (she is on Day 2).

## 2022-07-18 NOTE — Telephone Encounter (Signed)
Returned call to patient-advised will complete monitor prior to PET being completed.  Patient aware.

## 2022-07-20 ENCOUNTER — Telehealth: Payer: Self-pay

## 2022-07-20 NOTE — Telephone Encounter (Signed)
Spoke with pt. Pt was notified of lab results and recommendations. Pt will continue her current medication and follow up as planned.  

## 2022-07-23 ENCOUNTER — Other Ambulatory Visit: Payer: Self-pay | Admitting: Family Medicine

## 2022-07-23 DIAGNOSIS — I10 Essential (primary) hypertension: Secondary | ICD-10-CM

## 2022-08-16 NOTE — Addendum Note (Signed)
Addended by: Mayra Reel on: 08/16/2022 02:37 PM   Modules accepted: Orders

## 2022-08-18 ENCOUNTER — Telehealth (HOSPITAL_COMMUNITY): Payer: Self-pay | Admitting: *Deleted

## 2022-08-18 NOTE — Telephone Encounter (Signed)
Attempted to call patient regarding upcoming cardiac PET appointment. Left message on voicemail with name and callback number  Gordy Clement RN Navigator Cardiac Imaging [facility] [phone] Office [phone] Cell  Reminder to avoid caffeine 12 hours prior to cardiac PET appt.

## 2022-08-18 NOTE — Telephone Encounter (Signed)
Reaching out to patient to offer assistance regarding upcoming cardiac imaging study; pt verbalizes understanding of appt date/time, parking situation and where to check in, pre-test NPO status, and verified current allergies; name and call back number provided for further questions should they arise  Lindora Alviar RN Navigator Cardiac Imaging [facility] [phone] office [phone] cell  Patient aware to avoid caffeine 12 hours prior to her cardiac PET scan. 

## 2022-08-22 ENCOUNTER — Encounter (HOSPITAL_COMMUNITY)
Admission: RE | Admit: 2022-08-22 | Discharge: 2022-08-22 | Disposition: A | Discharge status: Home / Self Care | Payer: Medicare HMO | Source: Ambulatory Visit | Attending: Student | Admitting: Student

## 2022-08-22 DIAGNOSIS — R072 Precordial pain: Secondary | ICD-10-CM | POA: Diagnosis not present

## 2022-08-22 LAB — NM PET CT CARDIAC PERFUSION MULTI W/ABSOLUTE BLOODFLOW
MBFR: 2.57
Nuc Rest EF: 48 %
Nuc Stress EF: 61 %
Rest MBF: 0.82 ml/g/min
ST Depression (mm): 0 mm
Stress MBF: 2.11 ml/g/min
TID: 1.08

## 2022-08-22 MED ORDER — RUBIDIUM RB82 GENERATOR (RUBYFILL)
18.0000 | PACK | Freq: Once | INTRAVENOUS | Status: AC
  Administered 2022-08-22: 20.7 via INTRAVENOUS

## 2022-08-22 MED ORDER — REGADENOSON 0.4 MG/5ML IV SOLN
INTRAVENOUS | Status: AC
  Administered 2022-08-22: 0.4 mg via INTRAVENOUS
  Filled 2022-08-22: qty 5

## 2022-08-22 MED ORDER — REGADENOSON 0.4 MG/5ML IV SOLN: 0.4000 mg | Freq: Once | INTRAVENOUS | Status: AC

## 2022-08-25 ENCOUNTER — Telehealth: Payer: Self-pay

## 2022-08-25 NOTE — Telephone Encounter (Signed)
Spoke with pt. Pt was notified of Cardiac Pet Stress test results. She will f/u as planned.

## 2022-08-30 ENCOUNTER — Ambulatory Visit
Admission: RE | Admit: 2022-08-30 | Discharge: 2022-08-30 | Disposition: A | Discharge status: Home / Self Care | Payer: Medicare HMO | Source: Ambulatory Visit | Attending: Family Medicine | Admitting: Family Medicine

## 2022-08-30 DIAGNOSIS — Z1231 Encounter for screening mammogram for malignant neoplasm of breast: Secondary | ICD-10-CM

## 2022-08-31 ENCOUNTER — Other Ambulatory Visit: Payer: Self-pay | Admitting: Student

## 2022-09-01 ENCOUNTER — Other Ambulatory Visit: Payer: Self-pay | Admitting: Family Medicine

## 2022-09-01 DIAGNOSIS — R928 Other abnormal and inconclusive findings on diagnostic imaging of breast: Secondary | ICD-10-CM

## 2022-09-01 MED ORDER — VALACYCLOVIR HCL 500 MG PO TABS: 500.0000 mg | ORAL_TABLET | ORAL | 1 refills | Status: DC

## 2022-09-07 ENCOUNTER — Ambulatory Visit: Payer: Medicare HMO

## 2022-09-07 ENCOUNTER — Ambulatory Visit
Admission: RE | Admit: 2022-09-07 | Discharge: 2022-09-07 | Disposition: A | Discharge status: Home / Self Care | Payer: Medicare HMO | Source: Ambulatory Visit | Attending: Family Medicine | Admitting: Family Medicine

## 2022-09-07 DIAGNOSIS — R928 Other abnormal and inconclusive findings on diagnostic imaging of breast: Secondary | ICD-10-CM

## 2022-09-24 ENCOUNTER — Other Ambulatory Visit: Payer: Self-pay | Admitting: Student

## 2022-10-06 NOTE — Progress Notes (Unsigned)
Cardiology Clinic Note   Date: 10/10/2022 ID: Sahvanna, Amling Dec 30, 1955, MRN [id number]  Primary Cardiologist:  Shelva Majestic, MD  Patient Profile    Bonnie Gordon is a 67 y.o. female who presents to the clinic today for 3 month follow-up.  Past medical history significant for: Nonobstructive CAD. LHC 01/16/2003: 10-20% focal narrowing LAD. LHC 03/24/2008: Proximal LAD 30%, Mid RCA 30%. Coronary CT 04/07/2019: Calcium score 92, 58 percentile. Mild (25-49%) nonobstructive CAD proximal LAD, RI and LCx. 02/15/2022: EF 60 to 65%.  Mild asymmetric LVH of the basal-septal segment.  Grade I DD.  Aortic valve sclerosis without stenosis. Cardiac PET/CT 08/22/2022: Findings consistent with no ischemia or infarction.  EF mildly depressed, stress EF normal.  Overall low risk study with normal perfusion and normal myocardial blood flow. Palpitations. 14-day ZIO 08/04/2022: Sinus rhythm with average rate 94 bpm, min 69 bpm, max 146 bpm.  1 episode of brief SVT lasting 4 beats.  Rare PACs/PVCs.  No A-fib or pauses.  No high-grade ectopy. Hypertension.  Hyperlipidemia.  Lipid panel 07/13/2022: LDL 72, HDL 57, TG 134, total 152. T2DM. GERD.  Asthma.   History of Present Illness    Bonnie Gordon is a long time patient of Dr. Claiborne Billings since 2004 followed for CAD. She was last seen in the office on 07/13/2022 by me.  At that time she reported episodes of chest pain and shortness of breath. Pain described as non-radiating in the center of the chest  with associated shortness of breath occurring both at rest and with exertion and lasting anywhere from a few minutes to over an hour.  Chest pain occurred during times of increased stress but also at other times. She also reported palpitations occurring at least twice a week while at rest. Feels like racing lasting five minutes and resolves on it's own.  Palpitations were separate from episodes of chest pain. She endorsed increased amount of stress  secondary to working to take care of her husband with dementia.  14-day ZIO showed sinus rhythm and sinus tachycardia.  1 run of SVT lasting 4 beats.  Cardiac PET CT was negative for ischemia or infarction.  Resting EF is mildly depressed, recommend echo.  Today, patient is doing well overall.  She denies any continued chest pain.  She continues to have shortness of breath usually first thing in the morning or when she bends over to tie her shoes.  This usually dissipates as she gets up and starts moving around.  She has not noticed any palpitations.  She denies lower extremity edema, orthopnea or PND.  She continues to have an increased amount of stress and is facing a legal battle with her husband's family.  She is the primary caregiver for her husband who has dementia.  Discussed her recent testing including 14-day ZIO and cardiac PET/CT.  One of the recommendations for echo based on mildly depressed EF during rest.  She reports a strong family history of CAD and she would like to proceed with echo.  We also discussed recent lipid panel and that she is just shy of LDL goal.  She reports she had 2 to 3 days of increased BP at home.  She was not sure if it was a problem with her BP cuff.  She changed the batteries but continued to get readings of SBP in the 150s and 1 or 2 times as high as 200.  Denies headaches, dizziness or vision changes during this time.  This was several weeks ago  and she has not had any issues since.  She is encouraged to continue monitoring BP at home and call in with any issues.   ROS: All other systems reviewed and are otherwise negative except as noted in History of Present Illness.       Physical Exam    VS:  BP 120/84 (BP Location: Left Arm, Patient Position: Sitting, Cuff Size: Large)   Pulse 81   Ht '5\' 6"'$  (1.676 m)   Wt 177 lb 9.6 oz (80.6 kg)   SpO2 95%   BMI 28.67 kg/m  , BMI Body mass index is 28.67 kg/m.  GEN: Well nourished, well developed, in no acute  distress. Neck: No JVD or carotid bruits. Cardiac: RRR. No murmurs. No rubs or gallops.   Respiratory:  Respirations regular and unlabored. Clear to auscultation without rales, wheezing or rhonchi. GI: Soft, nontender, nondistended. Extremities: Radials/DP/PT 2+ and equal bilaterally. No clubbing or cyanosis. No edema.  Skin: Warm and dry, no rash. Neuro: Strength intact.  Assessment & Plan   Nonobstructive CAD.  Schneider 2004 in 2009 showed nonobstructive disease in LAD and RCA.  Coronary CTA 2020 showed mild nonobstructive disease proximal LAD, RI, LCx with calcium score of 92.  Cardiac PET/CT January 2024 was negative for ischemia or infarction.  Patient denies continued chest pain.  She does report mild dyspnea upon awakening each morning or when bending over to tie her shoes.  As soon as she starts moving around for the day this resolves.  Given recommendation from cardiac PET/CT will order an echo.  Continue aspirin, atorvastatin, olmesartan-amlodipine-HCTZ. Palpitations.  14-day ZIO showed normal sinus rhythm with 1 episode of SVT lasting 4 beats.  Patient denies any further palpitations.  Heart is regular rate and rhythm on auscultation.  She is instructed to call the office if she has any further sustained episodes. Hypertension.  BP today 120/84.  Patient denies headaches or dizziness.  Continue [place]-amlodipine-HCTZ.  Hyperlipidemia.  LDL December 2023 72, not at goal.  Denies concerns secondary to her strong family history of CAD.  Will add Zetia 10 mg daily.  Return in 8 to 10 weeks for repeat lipid panel and LFTs.  Disposition: Echo.  Add Zetia 10 mg daily.  Return in 8 to 10 weeks for repeat lipid panel and LFTs.  Return in 6 months or sooner as needed.        Signed, Justice Britain. Jontavious Commons, DNP, NP-C

## 2022-10-10 ENCOUNTER — Encounter: Payer: Self-pay | Admitting: Student

## 2022-10-10 ENCOUNTER — Ambulatory Visit: Payer: Medicare HMO | Attending: Student | Admitting: Student

## 2022-10-10 VITALS — BP 120/84 | HR 81 | Ht 66.0 in | Wt 177.6 lb

## 2022-10-10 DIAGNOSIS — R002 Palpitations: Secondary | ICD-10-CM

## 2022-10-10 DIAGNOSIS — R0602 Shortness of breath: Secondary | ICD-10-CM | POA: Diagnosis not present

## 2022-10-10 DIAGNOSIS — I2510 Atherosclerotic heart disease of native coronary artery without angina pectoris: Secondary | ICD-10-CM

## 2022-10-10 DIAGNOSIS — I10 Essential (primary) hypertension: Secondary | ICD-10-CM | POA: Diagnosis not present

## 2022-10-10 DIAGNOSIS — E785 Hyperlipidemia, unspecified: Secondary | ICD-10-CM

## 2022-10-10 MED ORDER — EZETIMIBE 10 MG PO TABS: 10.0000 mg | ORAL_TABLET | Freq: Every day | ORAL | 3 refills | Status: DC

## 2022-10-10 NOTE — Patient Instructions (Addendum)
Medication Instructions:  Your physician has recommended you make the following change in your medication:  START: Zetia '10mg'$  daily  *If you need a refill on your cardiac medications before your next appointment, please call your pharmacy*   Lab Work: Your physician recommends that you return in 8-10 weeks to have the following labs drawn: Lipids and Lft's  If you have labs (blood work) drawn today and your tests are completely normal, you will receive your results only by: [place] (if you have MyChart) OR A paper copy in the mail If you have any lab test that is abnormal or we need to change your treatment, we will call you to review the results.   Testing/Procedures: Your physician has requested that you have an echocardiogram. Echocardiography is a painless test that uses sound waves to create images of your heart. It provides your doctor with information about the size and shape of your heart and how well your heart's chambers and valves are working. This procedure takes approximately one hour. There are no restrictions for this procedure. Please do NOT wear cologne, perfume, aftershave, or lotions (deodorant is allowed). Please arrive 15 minutes prior to your appointment time.    Follow-Up: At [facility], you and your health needs are our priority.  As part of our continuing mission to provide you with exceptional heart care, we have created designated Provider Care Teams.  These Care Teams include your primary Cardiologist (physician) and Advanced Practice Providers (APPs -  Physician Assistants and Nurse Practitioners) who all work together to provide you with the care you need, when you need it.  We recommend signing up for the patient portal called "MyChart".  Sign up information is provided on this After Visit Summary.  MyChart is used to connect with patients for Virtual Visits (Telemedicine).  Patients are able to view lab/test results, encounter notes, upcoming  appointments, etc.  Non-urgent messages can be sent to your provider as well.   To learn more about what you can do with MyChart, go to [email or web address].    Your next appointment:   6 month(s)  Provider:   Shelva Majestic, MD

## 2022-10-11 ENCOUNTER — Encounter: Payer: Self-pay | Admitting: Internal Medicine

## 2022-10-11 ENCOUNTER — Ambulatory Visit (INDEPENDENT_AMBULATORY_CARE_PROVIDER_SITE_OTHER): Payer: Medicare HMO | Admitting: Internal Medicine

## 2022-10-11 VITALS — BP 132/80 | HR 84 | Ht 66.0 in | Wt 177.8 lb

## 2022-10-11 DIAGNOSIS — E1165 Type 2 diabetes mellitus with hyperglycemia: Secondary | ICD-10-CM | POA: Diagnosis not present

## 2022-10-11 DIAGNOSIS — E669 Obesity, unspecified: Secondary | ICD-10-CM | POA: Diagnosis not present

## 2022-10-11 DIAGNOSIS — E1159 Type 2 diabetes mellitus with other circulatory complications: Secondary | ICD-10-CM | POA: Diagnosis not present

## 2022-10-11 DIAGNOSIS — E785 Hyperlipidemia, unspecified: Secondary | ICD-10-CM

## 2022-10-11 LAB — POCT GLYCOSYLATED HEMOGLOBIN (HGB A1C): Hemoglobin A1C: 6.8 % — AB (ref 4.0–5.6)

## 2022-10-11 MED ORDER — OZEMPIC (1 MG/DOSE) 4 MG/3ML ~~LOC~~ SOPN: PEN_INJECTOR | SUBCUTANEOUS | 3 refills | Status: DC

## 2022-10-11 NOTE — Patient Instructions (Signed)
Please continue: - Farxiga 5 mg daily before breakfast - Ozempic 1 mg weekly   Please return in 4 months with your sugar log.

## 2022-10-11 NOTE — Progress Notes (Signed)
Patient ID: Bonnie Gordon, female   DOB: 1955/09/16, 67 y.o.   MRN: [id number]  HPI: Bonnie Gordon is a 67 y.o.-year-old female, presenting for follow-up for DM2, dx in 2012, insulin-independent, uncontrolled, with complications (CAD, PN). Last visit 4 months ago. She established care with [facility] >> changed practices since then.  Interim history: + blurry vision, + chest pain (related to anxiety).  She continues to have increased stress as husband has memory loss.  Reviewed HbA1c levels: Lab Results  Component Value Date   HGBA1C 7.6 (A) 06/13/2022   HGBA1C 6.6 (A) 08/15/2021   HGBA1C 7.2 (A) 04/12/2021   HGBA1C 6.8 (A) 12/07/2020   HGBA1C 6.9 (A) 08/09/2020   HGBA1C 7.2 (A) 04/06/2020   HGBA1C 8.3 (A) 11/25/2019   HGBA1C 7.1 (A) 07/29/2019   HGBA1C 7.7 (A) 04/01/2019   HGBA1C 7.5 (A) 07/15/2018   HGBA1C 8.1 08/24/2017   HGBA1C 6.8 04/25/2017   HGBA1C 6.5 01/18/2017   HGBA1C 6.2 10/18/2016   HGBA1C 7.5 07/20/2016   HGBA1C 9.5 04/20/2016   HGBA1C 8.1 (H) 08/30/2015   HGBA1C 7.1 (H) 02/25/2015   HGBA1C 7.0 (H) 08/26/2014   HGBA1C (H) 09/16/2009    6.5 (NOTE) The ADA recommends the following therapeutic goal for glycemic control related to Hgb A1c measurement: Goal of therapy: <6.5 Hgb A1c  Reference: American Diabetes Association: Clinical Practice Recommendations 2010, Diabetes Care, 2010, 33: (Suppl  1).  04/13/2021 ([facility]) HbA1c 7.1%  11/2018: HbA1c 7.2% 01/14/2016: HbA1c 11.3%  Patient is on: - Farxiga 5 mg daily before breakfast - added 04/2021 - Ozempic 0.5 >> 1 mg weekly She was previously on: - Levemir up to 60 units at bedtime-stopped 11/2018 - Glipizide 5 mg before dinner  - Glimepiride 4 mg before b'fast/ Glipizide XL 10 mg in am  - Tradjenta 5 mg in am  - Victoza 1.8 mg daily These were stopped when she started insulin. She was on Metformin >> diarrhea.  Pt checks her sugars 1x-2x a day (a CGM was not approved for her): - am:  90s,  135-165 >> 100-123 >> 150-160s >> 95-130, ave 110 - 2h after b'fast: 127 >> n/c >> 126 >> n/c >> 141 >> n/c - before lunch: 100, 120, 153 >> n/c >> 64 >> n/c - 2h after lunch: n/c >> 167 (cake) >> n/c - before dinner: 68, 113 >> 80-120 >> n/c >> 130 >> n/c - 2h after dinner: n/c >> 196 x1 >> n/c - bedtime: up to 145, 205, 219 >> up to 143 >> 120-164 >> 135-140, 176 - nighttime: n/c Lowest sugar was 64 >> ...  76 >> 90s >> 100 >> 90 >> 84; it is unclear at which level she has hypoglycemia awareness. Highest sugar was 409 (?) >>... 141 >> 205, 219 >> 164 >> 176 (x1 - sweets, sodas).  Glucometer: AccuChek [employer]iva  Meals: fish  - fried - once a month when she eats out; no red meat.  Salads with ranch dressing. Sweet tea - daily, and sodas - once a week.  -No CKD, last BUN/creatinine: Lab Results  Component Value Date   BUN 8 07/13/2022   BUN 10 06/20/2022   CREATININE 0.76 07/13/2022   CREATININE 0.73 06/20/2022  04/13/2021 ([facility]): 10/0.74, GFR 90, glucose 114 01/13/2016: 12/0.83, glucose 472  -+ HL; last set of lipids: Lab Results  Component Value Date   CHOL 152 07/13/2022   HDL 57 07/13/2022   LDLCALC 72 07/13/2022   LDLDIRECT 92 04/17/2022  TRIG 134 07/13/2022   CHOLHDL 2.7 07/13/2022  04/13/2021 ([facility]): 154/183/61/75 01/13/2016: 147/218/73/69 On Lipitor 40 mg daily.  - last eye exam was in 02/2022: no DR. She has incipient stable cataract. Sees [facility].  -+ Occasional numbness and tingling in her feet.  She sees podiatry.  Previously on Neurontin but ran out. I suggested alpha-lipoic acid and B complex in the past but she did not start.  Last foot exam 05/2022.  She was admitted with CP 05/2016 (found to have hypokalemia, mm cramps). On omeprazole -this is helping her GERD. She was in the emergency room with abd. pain 04/04/2021.  She was found to have kidney stones.  She tested positive for celiac disease.  She sees GI.  ROS: + See HPI  I reviewed pt's medications,  allergies, PMH, social hx, family hx, and changes were documented in the history of present illness. Otherwise, unchanged from my initial visit note.  Past Medical History:  Diagnosis Date   Anxiety    Bipolar disorder ([place])    Chest pain    a. 01/2003 MV w/ ? ant-inflat ischemia-->Cath: LAD 20p, otw nonobs dzs-->Med Rx; b. 2009 Cath: LAD 30, LCX nl/nondominant, RCA 30, nl EF; c. 08/2014 MV: no ischemia/infarct.   Complication of anesthesia    pt. states she is difficult to wake up   Depression    Diabetes mellitus    Type II   Diabetic neuropathy (HCC)    Diverticulitis    GERD (gastroesophageal reflux disease)    Headache    History of bronchitis    Hypercholesteremia    Hypertension    Hypokalemia    PONV (postoperative nausea and vomiting)    Shortness of breath dyspnea    Sleep apnea    Vertigo    patient reported   Past Surgical History:  Procedure Laterality Date   [place]  2009   normal cath   CARDIAC CATHETERIZATION  2004   normal cath   CHOLECYSTECTOMY  1994   TONSILLECTOMY     TONSILLECTOMY/ADENOIDECTOMY/TURBINATE REDUCTION Bilateral 03/01/2015   Procedure: TONSILLECTOMY/TURBINATE REDUCTION;  Surgeon: Rozetta Nunnery, MD;  Location: [facility];  Service: ENT;  Laterality: Bilateral;   UVULOPALATOPHARYNGOPLASTY Bilateral 03/01/2015   Procedure: UVULOPALATOPHARYNGOPLASTY (UPPP);  Surgeon: Rozetta Nunnery, MD;  Location: [facility];  Service: ENT;  Laterality: Bilateral;   WISDOM TOOTH EXTRACTION     Social History   Social History   Marital status: Divorced    Spouse name: N/A   Number of children: 2   Occupational History   Disability    Social History Main Topics   Smoking status: Former Smoker    Quit date: 08/07/1982   Smokeless tobacco: Never Used   Alcohol use Yes     Comment: Drinks during the weekends 1 beer or 1 drink of liquor per day    Drug use: No   Sexual activity: Yes     Partners: Male    Birth control/ protection: Surgical   Current Outpatient Medications on File Prior to Visit  Medication Sig Dispense Refill   ACCU-CHEK GUIDE test strip USE AS INSTRUCTED TO CHECK BLOOD SUGAR 2X DAILY 200 strip 3   aspirin EC 81 MG tablet Take 81 mg by mouth daily.     atorvastatin (LIPITOR) 40 MG tablet Take 1 tablet (40 mg total) by mouth daily. 90 tablet 3   Biotin 10000 MCG TABS Take by mouth.  Blood Glucose Monitoring Suppl (TRUE METRIX AIR GLUCOSE METER) w/Device KIT Use as instructed to check blood sugar daly 1 kit 0   celecoxib (CELEBREX) 200 MG capsule Take 1 capsule (200 mg total) by mouth daily as needed. 90 capsule 1   ezetimibe (ZETIA) 10 MG tablet Take 1 tablet (10 mg total) by mouth daily. 90 tablet 3   FARXIGA 5 MG TABS tablet TAKE 1 TABLET BY MOUTH DAILY BEFORE BREAKFAST. 90 tablet 3   fluticasone (FLONASE) 50 MCG/ACT nasal spray Place 1 spray into both nostrils daily. 11.1 mL 2   gabapentin (NEURONTIN) 300 MG capsule Take 1 capsule (300 mg total) by mouth 3 (three) times daily. Start at 1 capsule at bedtime for a week, may add  1 capsule at dinner for a total of 2 caps for another week.  May then add 1 cap in am for a total of 3  caps daily. 90 capsule 3   Metamucil Fiber CHEW Chew by mouth.     Olmesartan-amLODIPine-HCTZ 20-5-12.5 MG TABS Take 1 tablet by mouth daily. 90 tablet 1   Semaglutide, 1 MG/DOSE, (OZEMPIC, 1 MG/DOSE,) 4 MG/3ML SOPN INJECT '1MG'$  INTO THE SKIN ONCE A WEEK 9 mL 0   valACYclovir (VALTREX) 500 MG tablet Take 1 tablet (500 mg total) by mouth 2 (two) times daily. For 3 days when having active infection only. 180 tablet 0   No current facility-administered medications on file prior to visit.   Allergies  Allergen Reactions   Other Other (See Comments)    No Blood Products, personal preference   Latex Itching and Rash   Family History  Problem Relation Age of Onset   Diabetes Mother    Gout Mother    Hypertension Mother     Arthritis Mother    Heart disease Father    Heart failure Sister    Arthritis Sister    Heart disease Maternal Grandmother    Diabetes Maternal Grandmother    Heart disease Paternal Grandmother    Diabetes Paternal Grandmother    Diabetes Sister    Bursitis Sister    Crohn's disease Sister    PE: BP 132/80 (BP Location: Right Arm, Patient Position: Sitting, Cuff Size: Normal)   Pulse 84   Ht '5\' 6"'$  (1.676 m)   Wt 177 lb 12.8 oz (80.6 kg)   SpO2 97%   BMI 28.70 kg/m  Wt Readings from Last 3 Encounters:  10/11/22 177 lb 12.8 oz (80.6 kg)  10/10/22 177 lb 9.6 oz (80.6 kg)  07/13/22 175 lb 12.8 oz (79.7 kg)   Constitutional: overweight, in NAD Eyes:  EOMI, no exophthalmos ENT: no neck masses, no cervical lymphadenopathy Cardiovascular: RRR, No MRG Respiratory: CTA B Musculoskeletal: no deformities Skin:no rashes Neurological: no tremor with outstretched hands  ASSESSMENT: 1. DM2, insulin-independent now, uncontrolled, with complications - CAD - PN  2. HL  3.  Obesity class I  PLAN:  1. Patient with longstanding, uncontrolled, type 2 diabetes, improving after changing GLP-1 receptor agonist from Victoza to [place].  She also continues on SGLT2 inhibitor.  We were able to stop her sulfonylurea.  At last visit, sugars were higher, after she relaxed her diet and eating more sweets/drinking sweet tea.  I advised her to reduce sweets and stop sweet tea.  We discussed about possibly increasing the Farxiga dose but decided against it as she was planning to work on her diet.  HbA1c at last visit was higher, at 7.6%. -At today's visit, she mentions that  she is not drinking regular sodas approximately once a week but she drinks sweet tea on a regular basis.  I again strongly advised her to stop this.  Otherwise, sugars appear to be at goal so I would not suggest a change in regimen.  I refilled her Ozempic.  She tolerates this well. - I suggested to:  Patient Instructions  Please  continue: - Farxiga 5 mg daily before breakfast - Ozempic 1 mg weekly   Please return in 4 months with your sugar log.   - we checked her HbA1c: 6.8% (improved) - advised to check sugars at different times of the day - 1x a day, rotating check times - advised for yearly eye exams >> she is UTD - return to clinic in 4 months  2. HL -Reviewed latest lipid panel from 07/2022: LDL above our target of less than 55 due to cardiovascular disease, otherwise fractions at goal: Lab Results  Component Value Date   CHOL 152 07/13/2022   HDL 57 07/13/2022   LDLCALC 72 07/13/2022   LDLDIRECT 92 04/17/2022   TRIG 134 07/13/2022   CHOLHDL 2.7 07/13/2022  -She is on Lipitor 40 mg daily, previously on Crestor.  She tolerates this well.  3.  Obesity class I -continue SGLT 2 inhibitor and GLP-1 receptor agonist which should also help with weight loss -Weight was stable at last visit, previously lost 10 pounds -Weight is stable at today's visit  Philemon Kingdom, MD PhD [facility] Endocr[facility]ology

## 2022-11-08 ENCOUNTER — Ambulatory Visit (HOSPITAL_COMMUNITY): Payer: Medicare HMO | Attending: Internal Medicine

## 2022-11-08 DIAGNOSIS — R0602 Shortness of breath: Secondary | ICD-10-CM | POA: Insufficient documentation

## 2022-11-08 LAB — ECHOCARDIOGRAM COMPLETE
Area-P 1/2: 4.57 cm2
S' Lateral: 2.4 cm

## 2022-11-09 ENCOUNTER — Other Ambulatory Visit: Payer: Self-pay | Admitting: Student

## 2022-11-09 DIAGNOSIS — M79671 Pain in right foot: Secondary | ICD-10-CM

## 2022-12-15 ENCOUNTER — Other Ambulatory Visit: Payer: Self-pay | Admitting: Student

## 2022-12-15 DIAGNOSIS — I10 Essential (primary) hypertension: Secondary | ICD-10-CM

## 2022-12-23 ENCOUNTER — Other Ambulatory Visit: Payer: Self-pay | Admitting: Student

## 2023-01-27 ENCOUNTER — Other Ambulatory Visit: Payer: Self-pay | Admitting: Internal Medicine

## 2023-02-13 ENCOUNTER — Encounter: Payer: Self-pay | Admitting: Internal Medicine

## 2023-02-13 ENCOUNTER — Ambulatory Visit (INDEPENDENT_AMBULATORY_CARE_PROVIDER_SITE_OTHER): Payer: Medicare HMO | Admitting: Internal Medicine

## 2023-02-13 VITALS — BP 120/70 | HR 88 | Ht 66.0 in | Wt 175.4 lb

## 2023-02-13 DIAGNOSIS — E1165 Type 2 diabetes mellitus with hyperglycemia: Secondary | ICD-10-CM | POA: Diagnosis not present

## 2023-02-13 DIAGNOSIS — Z7984 Long term (current) use of oral hypoglycemic drugs: Secondary | ICD-10-CM | POA: Diagnosis not present

## 2023-02-13 DIAGNOSIS — Z7985 Long-term (current) use of injectable non-insulin antidiabetic drugs: Secondary | ICD-10-CM

## 2023-02-13 DIAGNOSIS — E119 Type 2 diabetes mellitus without complications: Secondary | ICD-10-CM

## 2023-02-13 DIAGNOSIS — E1159 Type 2 diabetes mellitus with other circulatory complications: Secondary | ICD-10-CM

## 2023-02-13 DIAGNOSIS — E785 Hyperlipidemia, unspecified: Secondary | ICD-10-CM | POA: Diagnosis not present

## 2023-02-13 DIAGNOSIS — E663 Overweight: Secondary | ICD-10-CM

## 2023-02-13 LAB — HEMOGLOBIN A1C: Hemoglobin A1C: 6.6

## 2023-02-13 MED ORDER — DAPAGLIFLOZIN PROPANEDIOL 5 MG PO TABS: 5.0000 mg | ORAL_TABLET | Freq: Every day | ORAL | 3 refills | Status: DC

## 2023-02-13 NOTE — Progress Notes (Signed)
Patient ID: Bonnie Gordon, female   DOB: [birth year], 67 y.o.   MRN: [id number]  HPI: Andrea Kraynak is a 67 y.o.-year-old female, presenting for follow-up for DM2, dx in 2012, insulin-independent, uncontrolled, with complications (CAD, PN). Last visit 4 months ago. She established care with [facility] >> changed practices since then.  Interim history: No blurry vision, but does not see well at night.  She has anxiety related to husband has memory loss - he is in day care 3x a week. She works part time. She started exercises 1 mo ago: 3x a week - walks a mile, then machines.  Reviewed HbA1c levels: Lab Results  Component Value Date   HGBA1C 6.8 (A) 10/11/2022   HGBA1C 7.6 (A) 06/13/2022   HGBA1C 6.6 (A) 08/15/2021   HGBA1C 7.2 (A) 04/12/2021   HGBA1C 6.8 (A) 12/07/2020   HGBA1C 6.9 (A) 08/09/2020   HGBA1C 7.2 (A) 04/06/2020   HGBA1C 8.3 (A) 11/25/2019   HGBA1C 7.1 (A) 07/29/2019   HGBA1C 7.7 (A) 04/01/2019   HGBA1C 7.5 (A) 07/15/2018   HGBA1C 8.1 08/24/2017   HGBA1C 6.8 04/25/2017   HGBA1C 6.5 01/18/2017   HGBA1C 6.2 10/18/2016   HGBA1C 7.5 07/20/2016   HGBA1C 9.5 04/20/2016   HGBA1C 8.1 (H) 08/30/2015   HGBA1C 7.1 (H) 02/25/2015   HGBA1C 7.0 (H) 08/26/2014  04/13/2021 ([facility]) HbA1c 7.1%  11/2018: HbA1c 7.2% 01/14/2016: HbA1c 11.3%  Patient is on: - Farxiga 5 mg daily before breakfast - added 04/2021 - Ozempic 0.5 >> 1 mg weekly She was previously on: - Levemir up to 60 units at bedtime-stopped 11/2018 - Glipizide 5 mg before dinner  - Glimepiride 4 mg before b'fast/ Glipizide XL 10 mg in am  - Tradjenta 5 mg in am  - Victoza 1.8 mg daily These were stopped when she started insulin. She was on Metformin >> diarrhea.  Pt checks her sugars 1x-2x a day (a CGM was not approved for her): - am:  90s, 135-165 >> 100-123 >> 150-160s >> 95-130, ave 110 >> 102-144 - 2h after b'fast: 127 >> n/c >> 126 >> n/c >> 141 >> 167 - before lunch: 100, 120, 153 >> n/c >> 64  >> n/c - 2h after lunch: n/c >> 167 (cake) >> n/c >> 117, 141 - before dinner: 68, 113 >> 80-120 >> n/c >> 130 >> n/c - 2h after dinner: n/c >> 196 x1 >> n/c  - bedtime: 145, 205, 219 >> up to 143 >> 120-164 >> 135-140, 176 >> 130, 145 - nighttime: n/c >> 138 Lowest sugar was 64 >> ...  90 >> 84 >> 102; it is unclear at which level she has hypoglycemia awareness. Highest sugar was 409 (?) >>... 164 >> 176 (x1 - sweets, sodas) >> 145  Glucometer: AccuChek [employer]  Meals: fish  - fried - once a month when she eats out; no red meat.  Salads with ranch dressing. Sweet tea - daily, and sodas - once a week.  -No CKD, last BUN/creatinine: Lab Results  Component Value Date   BUN 8 07/13/2022   BUN 10 06/20/2022   CREATININE 0.76 07/13/2022   CREATININE 0.73 06/20/2022  04/13/2021 ([facility]): 10/0.74, GFR 90, glucose 114 01/13/2016: 12/0.83, glucose 472  -+ HL; last set of lipids: Lab Results  Component Value Date   CHOL 152 07/13/2022   HDL 57 07/13/2022   LDLCALC 72 07/13/2022   LDLDIRECT 92 04/17/2022   TRIG 134 07/13/2022   CHOLHDL 2.7 07/13/2022  04/13/2021 (  [facility]): 154/183/61/75 01/13/2016: 147/218/73/69 On Lipitor 40 mg daily.  - last eye exam was in 02/2022: no DR. She has incipient stable cataract. Sees [facility].  -+ Occasional numbness and tingling in her feet.  She sees podiatry.  Previously on Neurontin but ran out. I suggested alpha-lipoic acid and B complex in the past but she did not start.  Last foot exam 05/18/2022.  She was admitted with CP 05/2016 (found to have hypokalemia, mm cramps). On omeprazole -this is helping her GERD. She was in the emergency room with abd. pain 04/04/2021.  She was found to have kidney stones.  She tested positive for celiac disease.  She sees GI.  ROS: + See HPI  I reviewed pt's medications, allergies, PMH, social hx, family hx, and changes were documented in the history of present illness. Otherwise, unchanged from my initial visit  note.  Past Medical History:  Diagnosis Date   Anxiety    Bipolar disorder (HCC)    Chest pain    a. 01/2003 MV w/ ? ant-inflat ischemia-->Cath: LAD 20p, otw nonobs dzs-->Med Rx; b. 2009 Cath: LAD 30, LCX nl/nondominant, RCA 30, nl EF; c. 08/2014 MV: no ischemia/infarct.   Complication of anesthesia    pt. states she is difficult to wake up   Depression    Diabetes mellitus    Type II   Diabetic neuropathy (HCC)    Diverticulitis    GERD (gastroesophageal reflux disease)    Headache    History of bronchitis    Hypercholesteremia    Hypertension    Hypokalemia    PONV (postoperative nausea and vomiting)    Shortness of breath dyspnea    Sleep apnea    Vertigo    patient reported   Past Surgical History:  Procedure Laterality Date   ABDOMINAL HYSTERECTOMY  1996   APPENDECTOMY  1990   CARDIAC CATHETERIZATION  2009   normal cath   CARDIAC CATHETERIZATION  2004   normal cath   CHOLECYSTECTOMY  1994   TONSILLECTOMY     TONSILLECTOMY/ADENOIDECTOMY/TURBINATE REDUCTION Bilateral 03/01/2015   Procedure: TONSILLECTOMY/TURBINATE REDUCTION;  Surgeon: Drema Halon, MD;  Location: [facility] OR;  Service: ENT;  Laterality: Bilateral;   UVULOPALATOPHARYNGOPLASTY Bilateral 03/01/2015   Procedure: UVULOPALATOPHARYNGOPLASTY (UPPP);  Surgeon: Drema Halon, MD;  Location: [facility] OR;  Service: ENT;  Laterality: Bilateral;   WISDOM TOOTH EXTRACTION     Social History   Social History   Marital status: Divorced    Spouse name: N/A   Number of children: 2   Occupational History   Disability    Social History Main Topics   Smoking status: Former Smoker    Quit date: 08/07/1982   Smokeless tobacco: Never Used   Alcohol use Yes     Comment: Drinks during the weekends 1 beer or 1 drink of liquor per day    Drug use: No   Sexual activity: Yes    Partners: Male    Birth control/ protection: Surgical   Current Outpatient Medications on File Prior to Visit  Medication Sig Dispense  Refill   ACCU-CHEK GUIDE test strip USE AS INSTRUCTED TO CHECK BLOOD SUGAR 2X DAILY 200 strip 3   aspirin EC 81 MG tablet Take 81 mg by mouth daily.     atorvastatin (LIPITOR) 40 MG tablet Take 1 tablet (40 mg total) by mouth daily. 90 tablet 3   Biotin 16109 MCG TABS Take by mouth.     Blood Glucose Monitoring Suppl (TRUE METRIX AIR GLUCOSE  METER) w/Device KIT Use as instructed to check blood sugar daly 1 kit 0   celecoxib (CELEBREX) 200 MG capsule TAKE 1 CAPSULE (200 MG TOTAL) BY MOUTH DAILY AS NEEDED. 90 capsule 1   ezetimibe (ZETIA) 10 MG tablet Take 1 tablet (10 mg total) by mouth daily. 90 tablet 3   FARXIGA 5 MG TABS tablet TAKE 1 TABLET BY MOUTH DAILY BEFORE BREAKFAST. 90 tablet 3   fluticasone (FLONASE) 50 MCG/ACT nasal spray Place 1 spray into both nostrils daily. 11.1 mL 2   gabapentin (NEURONTIN) 300 MG capsule Take 1 capsule (300 mg total) by mouth 3 (three) times daily. Start at 1 capsule at bedtime for a week, may add  1 capsule at dinner for a total of 2 caps for another week.  May then add 1 cap in am for a total of 3  caps daily. 90 capsule 3   Metamucil Fiber CHEW Chew by mouth.     Olmesartan-amLODIPine-HCTZ 20-5-12.5 MG TABS TAKE 1 TABLET BY MOUTH EVERY DAY 90 tablet 1   Semaglutide, 1 MG/DOSE, (OZEMPIC, 1 MG/DOSE,) 4 MG/3ML SOPN INJECT 1MG  INTO THE SKIN ONCE A WEEK 9 mL 3   valACYclovir (VALTREX) 500 MG tablet TAKE 1 TABLET BY MOUTH 2 (TWO) TIMES DAILY. FOR 3 DAYS WHEN HAVING ACTIVE INFECTION ONLY. 180 tablet 0   No current facility-administered medications on file prior to visit.   Allergies  Allergen Reactions   Other Other (See Comments)    No Blood Products, personal preference   Latex Itching and Rash   Family History  Problem Relation Age of Onset   Diabetes Mother    Gout Mother    Hypertension Mother    Arthritis Mother    Heart disease Father    Heart failure Sister    Arthritis Sister    Heart disease Maternal Grandmother    Diabetes Maternal  Grandmother    Heart disease Paternal Grandmother    Diabetes Paternal Grandmother    Diabetes Sister    Bursitis Sister    Crohn's disease Sister    PE: BP 120/70   Pulse 88   Ht 5\' 6"  (1.676 m)   Wt 175 lb 6.4 oz (79.6 kg)   SpO2 95%   BMI 28.31 kg/m  Wt Readings from Last 3 Encounters:  02/13/23 175 lb 6.4 oz (79.6 kg)  10/11/22 177 lb 12.8 oz (80.6 kg)  10/10/22 177 lb 9.6 oz (80.6 kg)   Constitutional: overweight, in NAD Eyes:  EOMI, no exophthalmos ENT: no neck masses, no cervical lymphadenopathy Cardiovascular: RRR, No MRG Respiratory: CTA B Musculoskeletal: no deformities Skin:no rashes Neurological: no tremor with outstretched hands  ASSESSMENT: 1. DM2, insulin-independent now, uncontrolled, with complications - CAD - PN  2. HL  3.  Overweight  PLAN:  1. Patient with longstanding, uncontrolled, type 2 diabetes, improving after changing GLP-1 receptor agonist from Victoza to Ozempic.  She also continues on SGLT2 inhibitor.  We were able to stop her sulfonylurea.  At last visit, HbA1c was improved, to 6.8%, after stopping regular sodas.  She was still drinking sweet tea on a regular basis and I strongly advised her to stop.  We did not change her regimen.  I refilled her Ozempic. -at today's visit, sugars are at or very slightly above the target range.  For now, we discussed about continuing exercise, which she started approximately 1 month ago, at the [facility], but I did not suggest a change in regimen.  I refilled her [place]. -  I suggested to:  Patient Instructions  Please continue: - Farxiga 5 mg daily before breakfast - Ozempic 1 mg weekly   Please return in 4 months with your sugar log.   - we checked her HbA1c: 6.6% (lower) - advised to check sugars at different times of the day - 1x a day, rotating check times - advised for yearly eye exams >> she is UTD - return to clinic in 4 months  2. HL -Reviewed latest lipid panel from 07/2022: LDL above our  target of less than 55, otherwise fractions at goal: Lab Results  Component Value Date   CHOL 152 07/13/2022   HDL 57 07/13/2022   LDLCALC 72 07/13/2022   LDLDIRECT 92 04/17/2022   TRIG 134 07/13/2022   CHOLHDL 2.7 07/13/2022  -On Lipitor 40 mg daily, tolerated well  3. Overweight -continue SGLT 2 inhibitor and GLP-1 receptor agonist which should also help with weight loss -Weight was stable at last 2 visits, and she lost 2 pounds since last visit  Carlus Pavlov, MD PhD [facility]

## 2023-02-13 NOTE — Patient Instructions (Signed)
Please continue: - Farxiga 5 mg daily before breakfast - Ozempic 1 mg weekly   Please return in 4 months with your sugar log.  

## 2023-03-11 NOTE — Progress Notes (Unsigned)
  SUBJECTIVE:   CHIEF COMPLAINT / HPI:   Boil on inner thigh -no drainage, no fevers  PERTINENT  PMH / PSH: ***  Past Medical History:  Diagnosis Date   Anxiety    Bipolar disorder (HCC)    Chest pain    a. 01/2003 MV w/ ? ant-inflat ischemia-->Cath: LAD 20p, otw nonobs dzs-->Med Rx; b. 2009 Cath: LAD 30, LCX nl/nondominant, RCA 30, nl EF; c. 08/2014 MV: no ischemia/infarct.   Complication of anesthesia    pt. states she is difficult to wake up   Depression    Diabetes mellitus    Type II   Diabetic neuropathy (HCC)    Diverticulitis    GERD (gastroesophageal reflux disease)    Headache    History of bronchitis    Hypercholesteremia    Hypertension    Hypokalemia    PONV (postoperative nausea and vomiting)    Shortness of breath dyspnea    Sleep apnea    Vertigo    patient reported    OBJECTIVE:  There were no vitals taken for this visit.  General: NAD, pleasant, able to participate in exam Cardiac: RRR, no murmurs auscultated Respiratory: CTAB, normal WOB Abdomen: soft, non-tender, non-distended, normoactive bowel sounds Extremities: warm and well perfused, no edema or cyanosis Skin: warm and dry, no rashes noted Neuro: alert, no obvious focal deficits, speech normal Psych: Normal affect and mood  ASSESSMENT/PLAN:   There are no diagnoses linked to this encounter. No orders of the defined types were placed in this encounter.  No follow-ups on file.  Vonna Drafts, MD [facility]

## 2023-03-12 ENCOUNTER — Ambulatory Visit (INDEPENDENT_AMBULATORY_CARE_PROVIDER_SITE_OTHER): Payer: Medicare HMO | Admitting: Family Medicine

## 2023-03-12 VITALS — BP 123/79 | HR 79 | Wt 179.1 lb

## 2023-03-12 DIAGNOSIS — L731 Pseudofolliculitis barbae: Secondary | ICD-10-CM | POA: Diagnosis not present

## 2023-03-12 NOTE — Patient Instructions (Signed)
It does not look like you need any antibiotics or drainage of your lesion right now.  It may be due to an ingrown hair.  However, if this does not resolve or improve within the next 2 weeks, or if you start having pain or fevers associated with it, please follow-up sooner rather than later.  You can apply bacitracin ointment to the area 1-3 times daily to prevent infection.

## 2023-03-16 ENCOUNTER — Telehealth: Payer: Self-pay

## 2023-03-16 ENCOUNTER — Ambulatory Visit (INDEPENDENT_AMBULATORY_CARE_PROVIDER_SITE_OTHER): Payer: Medicare HMO

## 2023-03-16 VITALS — Ht 66.0 in | Wt 179.0 lb

## 2023-03-16 DIAGNOSIS — Z0000 Encounter for general adult medical examination without abnormal findings: Secondary | ICD-10-CM

## 2023-03-17 NOTE — Progress Notes (Cosign Needed Addendum)
Subjective:   Bonnie Gordon is a 67 y.o. female who presents for an Initial Medicare Annual Wellness Visit.  Visit Complete: Virtual  I connected with  Bonnie Gordon on 03/16/23 by a audio enabled telemedicine application and verified that I am speaking with the correct person using two identifiers.  Patient Location: Home  Provider Location: Home Office  I discussed the limitations of evaluation and management by telemedicine. The patient expressed understanding and agreed to proceed.  Vital Signs: Unable to obtain new vitals due to this being a telehealth visit.   Review of Systems     Cardiac Risk Factors include: advanced age (>6men, >3 women);diabetes mellitus;hypertension;dyslipidemia     Objective:    Today's Vitals   03/17/23 1646  Weight: 179 lb (81.2 kg)  Height: 5\' 6"  (1.676 m)   Body mass index is 28.89 kg/m.     03/17/2023    4:54 PM 03/12/2023    8:49 AM 06/20/2022   10:26 AM 05/18/2022    8:25 AM 04/17/2022    8:44 AM 01/04/2022    8:51 AM 04/04/2021    1:44 AM  Advanced Directives  Does Patient Have a Medical Advance Directive? No No No No No No No  Would patient like information on creating a medical advance directive? Yes (MAU/Ambulatory/Procedural Areas - Information given) No - Patient declined No - Patient declined   No - Patient declined No - Patient declined    Current Medications (verified) Outpatient Encounter Medications as of 03/16/2023  Medication Sig   ACCU-CHEK GUIDE test strip USE AS INSTRUCTED TO CHECK BLOOD SUGAR 2X DAILY   aspirin EC 81 MG tablet Take 81 mg by mouth daily.   atorvastatin (LIPITOR) 40 MG tablet Take 1 tablet (40 mg total) by mouth daily.   Blood Glucose Monitoring Suppl (TRUE METRIX AIR GLUCOSE METER) w/Device KIT Use as instructed to check blood sugar daly   celecoxib (CELEBREX) 200 MG capsule TAKE 1 CAPSULE (200 MG TOTAL) BY MOUTH DAILY AS NEEDED.   dapagliflozin propanediol (FARXIGA) 5 MG TABS tablet Take 1  tablet (5 mg total) by mouth daily before breakfast.   ezetimibe (ZETIA) 10 MG tablet Take 1 tablet (10 mg total) by mouth daily.   fluticasone (FLONASE) 50 MCG/ACT nasal spray Place 1 spray into both nostrils daily.   gabapentin (NEURONTIN) 300 MG capsule Take 1 capsule (300 mg total) by mouth 3 (three) times daily. Start at 1 capsule at bedtime for a week, may add  1 capsule at dinner for a total of 2 caps for another week.  May then add 1 cap in am for a total of 3  caps daily.   Olmesartan-amLODIPine-HCTZ 20-5-12.5 MG TABS TAKE 1 TABLET BY MOUTH EVERY DAY   Semaglutide, 1 MG/DOSE, (OZEMPIC, 1 MG/DOSE,) 4 MG/3ML SOPN INJECT 1MG  INTO THE SKIN ONCE A WEEK   valACYclovir (VALTREX) 500 MG tablet TAKE 1 TABLET BY MOUTH 2 (TWO) TIMES DAILY. FOR 3 DAYS WHEN HAVING ACTIVE INFECTION ONLY.   No facility-administered encounter medications on file as of 03/16/2023.    Allergies (verified) Other and Latex   History: Past Medical History:  Diagnosis Date   Anxiety    Bipolar disorder (HCC)    Chest pain    a. 01/2003 MV w/ ? ant-inflat ischemia-->Cath: LAD 20p, otw nonobs dzs-->Med Rx; b. 2009 Cath: LAD 30, LCX nl/nondominant, RCA 30, nl EF; c. 08/2014 MV: no ischemia/infarct.   Complication of anesthesia    pt. states she is difficult to  wake up   Depression    Diabetes mellitus    Type II   Diabetic neuropathy (HCC)    Diverticulitis    GERD (gastroesophageal reflux disease)    Headache    History of bronchitis    Hypercholesteremia    Hypertension    Hypokalemia    PONV (postoperative nausea and vomiting)    Shortness of breath dyspnea    Sleep apnea    Vertigo    patient reported   Past Surgical History:  Procedure Laterality Date   ABDOMINAL HYSTERECTOMY  1996   APPENDECTOMY  1990   CARDIAC CATHETERIZATION  2009   normal cath   CARDIAC CATHETERIZATION  2004   normal cath   CHOLECYSTECTOMY  1994   TONSILLECTOMY     TONSILLECTOMY/ADENOIDECTOMY/TURBINATE REDUCTION Bilateral  03/01/2015   Procedure: TONSILLECTOMY/TURBINATE REDUCTION;  Surgeon: Drema Halon, MD;  Location: [facility] OR;  Service: ENT;  Laterality: Bilateral;   UVULOPALATOPHARYNGOPLASTY Bilateral 03/01/2015   Procedure: UVULOPALATOPHARYNGOPLASTY (UPPP);  Surgeon: Drema Halon, MD;  Location: [facility] OR;  Service: ENT;  Laterality: Bilateral;   WISDOM TOOTH EXTRACTION     Family History  Problem Relation Age of Onset   Diabetes Mother    Gout Mother    Hypertension Mother    Arthritis Mother    Heart disease Father    Heart failure Sister    Arthritis Sister    Heart disease Maternal Grandmother    Diabetes Maternal Grandmother    Heart disease Paternal Grandmother    Diabetes Paternal Grandmother    Diabetes Sister    Bursitis Sister    Crohn's disease Sister    Social History   Socioeconomic History   Marital status: Married    Spouse name: Not on file   Number of children: Not on file   Years of education: some college   Highest education level: Not on file  Occupational History   Occupation: Retired Lawyer  Tobacco Use   Smoking status: Former    Current packs/day: 0.00    Types: Cigarettes    Quit date: 08/07/1982    Years since quitting: 40.6    Passive exposure: Past   Smokeless tobacco: Never  Vaping Use   Vaping status: Never Used  Substance and Sexual Activity   Alcohol use: Yes    Comment: occasional / social   Drug use: No   Sexual activity: Yes    Partners: Male    Birth control/protection: Surgical  Other Topics Concern   Not on file  Social History Narrative   Lives at home alone.   Right-handed.    1 cup caffeine per day.   Social Determinants of Health   Financial Resource Strain: Low Risk  (03/17/2023)   Overall Financial Resource Strain (CARDIA)    Difficulty of Paying Living Expenses: Not hard at all  Food Insecurity: No Food Insecurity (03/17/2023)   Hunger Vital Sign    Worried About Running Out of Food in the Last Year: Never true    Ran  Out of Food in the Last Year: Never true  Transportation Needs: No Transportation Needs (03/17/2023)   PRAPARE - Administrator, Civil Service (Medical): No    Lack of Transportation (Non-Medical): No  Physical Activity: Inactive (03/17/2023)   Exercise Vital Sign    Days of Exercise per Week: 0 days    Minutes of Exercise per Session: 0 min  Stress: No Stress Concern Present (03/17/2023)   [employer] of Occupational  Health - Occupational Stress Questionnaire    Feeling of Stress : Not at all  Social Connections: Moderately Integrated (03/17/2023)   Social Connection and Isolation Panel [NHANES]    Frequency of Communication with Friends and Family: More than three times a week    Frequency of Social Gatherings with Friends and Family: Three times a week    Attends Religious Services: 1 to 4 times per year    Active Member of Clubs or Organizations: No    Attends Banker Meetings: Never    Marital Status: Married    Tobacco Counseling Counseling given: Not Answered   Clinical Intake:  Pre-visit preparation completed: Yes  Pain : No/denies pain     Diabetes: Yes CBG done?: No Did pt. bring in CBG monitor from home?: No  How often do you need to have someone help you when you read instructions, pamphlets, or other written materials from your doctor or pharmacy?: 1 - Never  Interpreter Needed?: No  Information entered by :: Kandis Fantasia LPN   Activities of Daily Living    03/17/2023    4:53 PM  In your present state of health, do you have any difficulty performing the following activities:  Hearing? 0  Vision? 0  Difficulty concentrating or making decisions? 0  Walking or climbing stairs? 0  Dressing or bathing? 0  Doing errands, shopping? 0  Preparing Food and eating ? N  Using the Toilet? N  In the past six months, have you accidently leaked urine? N  Do you have problems with loss of bowel control? N  Managing your Medications? N   Managing your Finances? N  Housekeeping or managing your Housekeeping? N    Patient Care Team: [name], DO as PCP - General (Family Medicine) Lennette Bihari, MD as PCP - Cardiology (Cardiology) Illene Labrador, OD (Optometry) Carlus Pavlov, MD as Consulting Physician (Internal Medicine) Lilian Kapur, Rachelle Hora, DPM as Consulting Physician (Podiatry)  Indicate any recent Medical Services you may have received from other than [facility] providers in the past year (date may be approximate).     Assessment:   This is a routine wellness examination for Bonnie Gordon.  Hearing/Vision screen Hearing Screening - Comments:: Denies hearing difficulties   Vision Screening - Comments:: Wears rx glasses - up to date with routine eye exams with [facility]    Dietary issues and exercise activities discussed:     Goals Addressed             This Visit's Progress    Remain active and independent        Depression Screen    03/17/2023    4:51 PM 03/12/2023    8:51 AM 06/20/2022   10:27 AM 05/18/2022    8:24 AM 04/17/2022    8:44 AM 02/08/2022    8:37 AM 01/04/2022    8:52 AM  PHQ 2/9 Scores  PHQ - 2 Score 0 0 0 0 0 0 2  PHQ- 9 Score 3 3 0 0 0 1 4    Fall Risk    03/17/2023    4:52 PM 03/12/2023    8:51 AM 06/20/2022   10:26 AM 05/18/2022    8:24 AM 01/04/2022    8:52 AM  Fall Risk   Falls in the past year? 0 0 0 0 0  Number falls in past yr: 0 0 0 0 0  Injury with Fall? 0 0 0 0 0  Risk for fall due to : No Fall  Risks  No Fall Risks    Follow up Falls prevention discussed;Education provided;Falls evaluation completed  Falls prevention discussed      MEDICARE RISK AT HOME:  Medicare Risk at Home - 03/17/23 1653     Any stairs in or around the home? No    If so, are there any without handrails? No    Home free of loose throw rugs in walkways, pet beds, electrical cords, etc? Yes    Adequate lighting in your home to reduce risk of falls? Yes    Life alert? No    Use of a cane,  walker or w/c? No    Grab bars in the bathroom? No    Shower chair or bench in shower? Yes    Elevated toilet seat or a handicapped toilet? No             TIMED UP AND GO:  Was the test performed? No    Cognitive Function:        03/17/2023    4:54 PM  6CIT Screen  What Year? 0 points  What month? 0 points  What time? 0 points  Count back from 20 0 points  Months in reverse 0 points  Repeat phrase 0 points  Total Score 0 points    Immunizations Immunization History  Administered Date(s) Administered   COVID-19, mRNA, vaccine(Comirnaty)12 years and older 06/20/2022   Fluad Quad(high Dose 65+) 04/18/2022   Influenza,inj,Quad PF,6+ Mos 04/25/2017, 05/20/2018, 04/19/2019   PFIZER(Purple Top)SARS-COV-2 Vaccination 12/06/2019, 12/29/2019   PNEUMOCOCCAL CONJUGATE-20 06/20/2022    TDAP status: Due, Education has been provided regarding the importance of this vaccine. Advised may receive this vaccine at local pharmacy or Health Dept. Aware to provide a copy of the vaccination record if obtained from local pharmacy or Health Dept. Verbalized acceptance and understanding.  Flu Vaccine status: Declined, Education has been provided regarding the importance of this vaccine but patient still declined. Advised may receive this vaccine at local pharmacy or Health Dept. Aware to provide a copy of the vaccination record if obtained from local pharmacy or Health Dept. Verbalized acceptance and understanding.  Pneumococcal vaccine status: Up to date  Covid-19 vaccine status: Information provided on how to obtain vaccines.   Qualifies for Shingles Vaccine? Yes   Zostavax completed No   Shingrix Completed?: No.    Education has been provided regarding the importance of this vaccine. Patient has been advised to call insurance company to determine out of pocket expense if they have not yet received this vaccine. Advised may also receive vaccine at local pharmacy or Health Dept. Verbalized  acceptance and understanding.  Screening Tests Health Maintenance  Topic Date Due   DTaP/Tdap/Td (1 - Tdap) Never done   Zoster Vaccines- Shingrix (1 of 2) Never done   DEXA SCAN  Never done   OPHTHALMOLOGY EXAM  02/22/2023   INFLUENZA VACCINE  03/08/2023   COVID-19 Vaccine (4 - 2023-24 season) 03/28/2023 (Originally 10/19/2022)   HEMOGLOBIN A1C  04/13/2023   Diabetic kidney evaluation - Urine ACR  05/19/2023   FOOT EXAM  05/19/2023   Diabetic kidney evaluation - eGFR measurement  07/14/2023   Medicare Annual Wellness (AWV)  03/15/2024   MAMMOGRAM  08/30/2024   Colonoscopy  01/21/2032   Pneumonia Vaccine 48+ Years old  Completed   Hepatitis C Screening  Completed   HPV VACCINES  Aged Out    Health Maintenance  Health Maintenance Due  Topic Date Due   DTaP/Tdap/Td (1 - Tdap)  Never done   Zoster Vaccines- Shingrix (1 of 2) Never done   DEXA SCAN  Never done   OPHTHALMOLOGY EXAM  02/22/2023   INFLUENZA VACCINE  03/08/2023    Colorectal cancer screening: Type of screening: Colonoscopy. Completed 01/20/22. Repeat every 10 years  Mammogram status: Completed 08/30/22. Repeat every year  Lung Cancer Screening: (Low Dose CT Chest recommended if Age 30-80 years, 20 pack-year currently smoking OR have quit w/in 15years.) does not qualify.   Lung Cancer Screening Referral: n/a  Additional Screening:  Hepatitis C Screening: does qualify; Completed 06/20/22  Vision Screening: Recommended annual ophthalmology exams for early detection of glaucoma and other disorders of the eye. Is the patient up to date with their annual eye exam?  Yes  Who is the provider or what is the name of the office in which the patient attends annual eye exams? [facility]  If pt is not established with a provider, would they like to be referred to a provider to establish care? No .   Dental Screening: Recommended annual dental exams for proper oral hygiene  Diabetic Foot Exam: Diabetic Foot Exam:  Completed 05/18/22  Community Resource Referral / Chronic Care Management: CRR required this visit?  No   CCM required this visit?  No     Plan:     I have personally reviewed and noted the following in the patient's chart:   Medical and social history Use of alcohol, tobacco or illicit drugs  Current medications and supplements including opioid prescriptions. Patient is not currently taking opioid prescriptions. Functional ability and status Nutritional status Physical activity Advanced directives List of other physicians Hospitalizations, surgeries, and ER visits in previous 12 months Vitals Screenings to include cognitive, depression, and falls Referrals and appointments  In addition, I have reviewed and discussed with patient certain preventive protocols, quality metrics, and best practice recommendations. A written personalized care plan for preventive services as well as general preventive health recommendations were provided to patient.     Kandis Fantasia [place], [place]   11/06/270   After Visit Summary: (MyChart) Due to this being a telephonic visit, the after visit summary with patients personalized plan was offered to patient via MyChart   Nurse Notes: No concerns at this time

## 2023-03-17 NOTE — Patient Instructions (Signed)
[name] , Thank you for taking time to come for your Medicare Wellness Visit. I appreciate your ongoing commitment to your health goals. Please review the following plan we discussed and let me know if I can assist you in the future.   Referrals/Orders/Follow-Ups/Clinician Recommendations: Aim for 30 minutes of exercise or brisk walking, 6-8 glasses of water, and 5 servings of fruits and vegetables each day.  This is a list of the screening recommended for you and due dates:  Health Maintenance  Topic Date Due   DTaP/Tdap/Td vaccine (1 - Tdap) Never done   Zoster (Shingles) Vaccine (1 of 2) Never done   DEXA scan (bone density measurement)  Never done   Eye exam for diabetics  02/22/2023   Flu Shot  03/08/2023   COVID-19 Vaccine (4 - 2023-24 season) 03/28/2023*   Hemoglobin A1C  04/13/2023   Yearly kidney health urinalysis for diabetes  05/19/2023   Complete foot exam   05/19/2023   Yearly kidney function blood test for diabetes  07/14/2023   Medicare Annual Wellness Visit  03/15/2024   Mammogram  08/30/2024   Colon Cancer Screening  01/21/2032   Pneumonia Vaccine  Completed   Hepatitis C Screening  Completed   HPV Vaccine  Aged Out  *Topic was postponed. The date shown is not the original due date.    Advanced directives: (ACP Link)Information on Advanced Care Planning can be found at [facility] Advance Health Care Directives Advance Health Care Directives ([email or web address]   Next Medicare Annual Wellness Visit scheduled for next year: Yes  Preventive Care 65 Years and Older, Female Preventive care refers to lifestyle choices and visits with your health care provider that can promote health and wellness. What does preventive care include? A yearly physical exam. This is also called an annual well check. Dental exams once or twice a year. Routine eye exams. Ask your health care provider how often you should have your eyes checked. Personal lifestyle  choices, including: Daily care of your teeth and gums. Regular physical activity. Eating a healthy diet. Avoiding tobacco and drug use. Limiting alcohol use. Practicing safe sex. Taking low-dose aspirin every day. Taking vitamin and mineral supplements as recommended by your health care provider. What happens during an annual well check? The services and screenings done by your health care provider during your annual well check will depend on your age, overall health, lifestyle risk factors, and family history of disease. Counseling  Your health care provider may ask you questions about your: Alcohol use. Tobacco use. Drug use. Emotional well-being. Home and relationship well-being. Sexual activity. Eating habits. History of falls. Memory and ability to understand (cognition). Work and work Astronomer. Reproductive health. Screening  You may have the following tests or measurements: Height, weight, and BMI. Blood pressure. Lipid and cholesterol levels. These may be checked every 5 years, or more frequently if you are over 53 years old. Skin check. Lung cancer screening. You may have this screening every year starting at age 64 if you have a 30-pack-year history of smoking and currently smoke or have quit within the past 15 years. Fecal occult blood test (FOBT) of the stool. You may have this test every year starting at age 61. Flexible sigmoidoscopy or colonoscopy. You may have a sigmoidoscopy every 5 years or a colonoscopy every 10 years starting at age 91. Hepatitis C blood test. Hepatitis B blood test. Sexually transmitted disease (STD) testing. Diabetes screening. This is done by checking your blood  sugar (glucose) after you have not eaten for a while (fasting). You may have this done every 1-3 years. Bone density scan. This is done to screen for osteoporosis. You may have this done starting at age 59. Mammogram. This may be done every 1-2 years. Talk to your health care  provider about how often you should have regular mammograms. Talk with your health care provider about your test results, treatment options, and if necessary, the need for more tests. Vaccines  Your health care provider may recommend certain vaccines, such as: Influenza vaccine. This is recommended every year. Tetanus, diphtheria, and acellular pertussis (Tdap, Td) vaccine. You may need a Td booster every 10 years. Zoster vaccine. You may need this after age 49. Pneumococcal 13-valent conjugate (PCV13) vaccine. One dose is recommended after age 79. Pneumococcal polysaccharide (PPSV23) vaccine. One dose is recommended after age 49. Talk to your health care provider about which screenings and vaccines you need and how often you need them. This information is not intended to replace advice given to you by your health care provider. Make sure you discuss any questions you have with your health care provider. Document Released: 08/20/2015 Document Revised: 04/12/2016 Document Reviewed: 05/25/2015 Elsevier Interactive Patient Education  2017 [employer].  Fall Prevention in the Home Falls can cause injuries. They can happen to people of all ages. There are many things you can do to make your home safe and to help prevent falls. What can I do on the outside of my home? Regularly fix the edges of walkways and driveways and fix any cracks. Remove anything that might make you trip as you walk through a door, such as a raised step or threshold. Trim any bushes or trees on the path to your home. Use bright outdoor lighting. Clear any walking paths of anything that might make someone trip, such as rocks or tools. Regularly check to see if handrails are loose or broken. Make sure that both sides of any steps have handrails. Any raised decks and porches should have guardrails on the edges. Have any leaves, snow, or ice cleared regularly. Use sand or salt on walking paths during winter. Clean up any  spills in your garage right away. This includes oil or grease spills. What can I do in the bathroom? Use night lights. Install grab bars by the toilet and in the tub and shower. Do not use towel bars as grab bars. Use non-skid mats or decals in the tub or shower. If you need to sit down in the shower, use a plastic, non-slip stool. Keep the floor dry. Clean up any water that spills on the floor as soon as it happens. Remove soap buildup in the tub or shower regularly. Attach bath mats securely with double-sided non-slip rug tape. Do not have throw rugs and other things on the floor that can make you trip. What can I do in the bedroom? Use night lights. Make sure that you have a light by your bed that is easy to reach. Do not use any sheets or blankets that are too big for your bed. They should not hang down onto the floor. Have a firm chair that has side arms. You can use this for support while you get dressed. Do not have throw rugs and other things on the floor that can make you trip. What can I do in the kitchen? Clean up any spills right away. Avoid walking on wet floors. Keep items that you use a lot in easy-to-reach  places. If you need to reach something above you, use a strong step stool that has a grab bar. Keep electrical cords out of the way. Do not use floor polish or wax that makes floors slippery. If you must use wax, use non-skid floor wax. Do not have throw rugs and other things on the floor that can make you trip. What can I do with my stairs? Do not leave any items on the stairs. Make sure that there are handrails on both sides of the stairs and use them. Fix handrails that are broken or loose. Make sure that handrails are as long as the stairways. Check any carpeting to make sure that it is firmly attached to the stairs. Fix any carpet that is loose or worn. Avoid having throw rugs at the top or bottom of the stairs. If you do have throw rugs, attach them to the floor  with carpet tape. Make sure that you have a light switch at the top of the stairs and the bottom of the stairs. If you do not have them, ask someone to add them for you. What else can I do to help prevent falls? Wear shoes that: Do not have high heels. Have rubber bottoms. Are comfortable and fit you well. Are closed at the toe. Do not wear sandals. If you use a stepladder: Make sure that it is fully opened. Do not climb a closed stepladder. Make sure that both sides of the stepladder are locked into place. Ask someone to hold it for you, if possible. Clearly mark and make sure that you can see: Any grab bars or handrails. First and last steps. Where the edge of each step is. Use tools that help you move around (mobility aids) if they are needed. These include: Canes. Walkers. Scooters. Crutches. Turn on the lights when you go into a dark area. Replace any light bulbs as soon as they burn out. Set up your furniture so you have a clear path. Avoid moving your furniture around. If any of your floors are uneven, fix them. If there are any pets around you, be aware of where they are. Review your medicines with your doctor. Some medicines can make you feel dizzy. This can increase your chance of falling. Ask your doctor what other things that you can do to help prevent falls. This information is not intended to replace advice given to you by your health care provider. Make sure you discuss any questions you have with your health care provider. Document Released: 05/20/2009 Document Revised: 12/30/2015 Document Reviewed: 08/28/2014 Elsevier Interactive Patient Education  2017 [employer].

## 2023-03-27 ENCOUNTER — Other Ambulatory Visit: Payer: Self-pay | Admitting: Student

## 2023-03-29 NOTE — Telephone Encounter (Signed)
Erroneous encounter

## 2023-05-01 ENCOUNTER — Ambulatory Visit: Payer: Medicare HMO | Attending: Cardiovascular Disease | Admitting: Cardiovascular Disease

## 2023-05-06 ENCOUNTER — Other Ambulatory Visit: Payer: Self-pay | Admitting: Student

## 2023-05-06 DIAGNOSIS — M79671 Pain in right foot: Secondary | ICD-10-CM

## 2023-05-06 DIAGNOSIS — E785 Hyperlipidemia, unspecified: Secondary | ICD-10-CM

## 2023-06-08 ENCOUNTER — Ambulatory Visit: Payer: Medicare HMO | Admitting: Student

## 2023-06-10 ENCOUNTER — Encounter (HOSPITAL_COMMUNITY): Payer: Self-pay | Admitting: Emergency Medicine

## 2023-06-10 ENCOUNTER — Emergency Department (HOSPITAL_COMMUNITY): Payer: Medicare HMO

## 2023-06-10 ENCOUNTER — Emergency Department (HOSPITAL_BASED_OUTPATIENT_CLINIC_OR_DEPARTMENT_OTHER): Payer: Medicare HMO

## 2023-06-10 ENCOUNTER — Other Ambulatory Visit: Payer: Self-pay

## 2023-06-10 ENCOUNTER — Emergency Department (HOSPITAL_COMMUNITY)
Admission: EM | Admit: 2023-06-10 | Discharge: 2023-06-10 | Disposition: A | Discharge status: Home / Self Care | Payer: Medicare HMO | Attending: Emergency Medicine | Admitting: Emergency Medicine

## 2023-06-10 DIAGNOSIS — R079 Chest pain, unspecified: Secondary | ICD-10-CM | POA: Diagnosis not present

## 2023-06-10 DIAGNOSIS — Z79899 Other long term (current) drug therapy: Secondary | ICD-10-CM | POA: Insufficient documentation

## 2023-06-10 DIAGNOSIS — I10 Essential (primary) hypertension: Secondary | ICD-10-CM | POA: Insufficient documentation

## 2023-06-10 DIAGNOSIS — E119 Type 2 diabetes mellitus without complications: Secondary | ICD-10-CM | POA: Insufficient documentation

## 2023-06-10 DIAGNOSIS — Z7982 Long term (current) use of aspirin: Secondary | ICD-10-CM | POA: Insufficient documentation

## 2023-06-10 DIAGNOSIS — R0789 Other chest pain: Secondary | ICD-10-CM | POA: Diagnosis not present

## 2023-06-10 DIAGNOSIS — Z91040 Latex allergy status: Secondary | ICD-10-CM | POA: Diagnosis not present

## 2023-06-10 DIAGNOSIS — I2510 Atherosclerotic heart disease of native coronary artery without angina pectoris: Secondary | ICD-10-CM | POA: Diagnosis not present

## 2023-06-10 DIAGNOSIS — R072 Precordial pain: Secondary | ICD-10-CM | POA: Insufficient documentation

## 2023-06-10 DIAGNOSIS — M79662 Pain in left lower leg: Secondary | ICD-10-CM

## 2023-06-10 DIAGNOSIS — M79605 Pain in left leg: Secondary | ICD-10-CM | POA: Insufficient documentation

## 2023-06-10 LAB — TROPONIN I (HIGH SENSITIVITY)
Troponin I (High Sensitivity): 2 ng/L (ref <18)
Troponin I (High Sensitivity): 3 ng/L (ref <18)

## 2023-06-10 LAB — BASIC METABOLIC PANEL
Anion gap: 6 (ref 5–15)
BUN: 14 mg/dL (ref 8–23)
CO2: 30 mmol/L (ref 22–32)
Calcium: 8.7 mg/dL — ABNORMAL LOW (ref 8.9–10.3)
Chloride: 103 mmol/L (ref 98–111)
Creatinine, Ser: 0.63 mg/dL (ref 0.44–1.00)
GFR, Estimated: >60 mL/min (ref >60)
Glucose, Bld: 170 mg/dL — ABNORMAL HIGH (ref 70–99)
Potassium: 3.4 mmol/L — ABNORMAL LOW (ref 3.5–5.1)
Sodium: 139 mmol/L (ref 135–145)

## 2023-06-10 LAB — CBC
HCT: 43.2 % (ref 36.0–46.0)
Hemoglobin: 14 g/dL (ref 12.0–15.0)
MCH: 29.3 pg (ref 26.0–34.0)
MCHC: 32.4 g/dL (ref 30.0–36.0)
MCV: 90.4 fL (ref 80.0–100.0)
Platelets: 161 10*3/uL (ref 150–400)
RBC: 4.78 MIL/uL (ref 3.87–5.11)
RDW: 13.3 % (ref 11.5–15.5)
WBC: 4.3 10*3/uL (ref 4.0–10.5)
nRBC: 0 % (ref 0.0–0.2)

## 2023-06-10 MED ORDER — METHOCARBAMOL 500 MG PO TABS
500.0000 mg | ORAL_TABLET | Freq: Once | ORAL | Status: AC
  Administered 2023-06-10: 500 mg via ORAL
  Filled 2023-06-10: qty 1

## 2023-06-10 MED ORDER — MORPHINE SULFATE (PF) 4 MG/ML IV SOLN
4.0000 mg | Freq: Once | INTRAVENOUS | Status: AC
  Administered 2023-06-10: 4 mg via INTRAMUSCULAR
  Filled 2023-06-10: qty 1

## 2023-06-10 MED ORDER — POTASSIUM CHLORIDE CRYS ER 20 MEQ PO TBCR
40.0000 meq | EXTENDED_RELEASE_TABLET | Freq: Once | ORAL | Status: AC
  Administered 2023-06-10: 40 meq via ORAL
  Filled 2023-06-10: qty 2

## 2023-06-10 NOTE — Discharge Instructions (Signed)
As we discussed, your workup in the ER today was reassuring for acute findings.  Laboratory evaluation, x-ray, EKG, and an ultrasound of your lower leg did not reveal any emergent concerns. For your symptoms, I recommend that you get plenty of rest, take your home meds, ice the area on your leg that is tender and potentially a wrap it with an Ace wrap.  Please keep it elevated as well.  Follow-up closely with your primary doctor at your appointment tomorrow.  Return if development of any new or worsening symptoms.

## 2023-06-10 NOTE — Progress Notes (Signed)
LLE venous duplex has been completed.  Preliminary results given to Lurena Nida, PA-C.    Results can be found under chart review under CV PROC. 06/10/2023 12:51 PM Bentli Llorente RVT, RDMS

## 2023-06-10 NOTE — ED Provider Notes (Signed)
[place] EMERGENCY DEPARTMENT AT [facility] Provider Note   CSN: [id number] Arrival date & time: 06/10/23  0014     History  Chief Complaint  Patient presents with   Leg Pain    Left    Bonnie Gordon is a 67 y.o. female.  Patient with history of CAD, hypertension, diabetes presents today with complaints of left leg pain. She states that same has been bothering her for the past 1 week.  States that it is not worsening but has not improved either.  She has not seen anyone for this previously.  She denies any known injury. No new or worsening back pain. Pain is in her left thigh area down to the knee. Tender to touch. No skin changes or warmth. Good sensation in her lower leg. Able to walk without pain. Denies any history of similar symptoms previously. Denies fevers or chills.   The history is provided by the patient. No language interpreter was used.  Leg Pain      Home Medications Prior to Admission medications   Medication Sig Start Date End Date Taking? Authorizing Provider  ACCU-CHEK GUIDE test strip USE AS INSTRUCTED TO CHECK BLOOD SUGAR 2X DAILY 06/07/22   Carlus Pavlov, MD  aspirin EC 81 MG tablet Take 81 mg by mouth daily.    [provider]  atorvastatin (LIPITOR) 40 MG tablet TAKE 1 TABLET BY MOUTH EVERY DAY 05/07/23   [name], DO  Blood Glucose Monitoring Suppl (TRUE METRIX AIR GLUCOSE METER) w/Device Bonnie Use as instructed to check blood sugar daly 06/03/21   Carlus Pavlov, MD  celecoxib (CELEBREX) 200 MG capsule TAKE 1 CAPSULE (200 MG TOTAL) BY MOUTH DAILY AS NEEDED. 05/07/23   [name], DO  dapagliflozin propanediol (FARXIGA) 5 MG TABS tablet Take 1 tablet (5 mg total) by mouth daily before breakfast. 02/13/23   Carlus Pavlov, MD  ezetimibe (ZETIA) 10 MG tablet Take 1 tablet (10 mg total) by mouth daily. 10/10/22   Carlos Levering, NP  fluticasone (FLONASE) 50 MCG/ACT nasal spray Place 1 spray into both nostrils daily.  02/14/22   Jacalyn Lefevre, MD  gabapentin (NEURONTIN) 300 MG capsule Take 1 capsule (300 mg total) by mouth 3 (three) times daily. Start at 1 capsule at bedtime for a week, may add  1 capsule at dinner for a total of 2 caps for another week.  May then add 1 cap in am for a total of 3  caps daily. 05/30/22   Delories Heinz, DPM  Olmesartan-amLODIPine-HCTZ 20-5-12.5 MG TABS TAKE 1 TABLET BY MOUTH EVERY DAY 12/15/22   [name], DO  Semaglutide, 1 MG/DOSE, (OZEMPIC, 1 MG/DOSE,) 4 MG/3ML SOPN INJECT 1MG  INTO THE SKIN ONCE A WEEK 10/11/22   Carlus Pavlov, MD  valACYclovir (VALTREX) 500 MG tablet TAKE 1 TABLET BY MOUTH 2 (TWO) TIMES DAILY. FOR 3 DAYS WHEN HAVING ACTIVE INFECTION ONLY. 03/27/23   [name], DO      Allergies    Other and Latex    Review of Systems   Review of Systems  All other systems reviewed and are negative.   Physical Exam Updated Vital Signs BP (!) 118/90 (BP Location: Left Arm)   Pulse 77   Temp 98.3 F (36.8 C) (Oral)   Resp 16   Ht 5\' 6"  (1.676 m)   Wt 78.9 kg   SpO2 99%   BMI 28.08 kg/m  Physical Exam Vitals and nursing note reviewed.  Constitutional:  General: She is not in acute distress.    Appearance: Normal appearance. She is normal weight. She is not ill-appearing, toxic-appearing or diaphoretic.  HENT:     Head: Normocephalic and atraumatic.  Cardiovascular:     Rate and Rhythm: Normal rate and regular rhythm.     Pulses:          Dorsalis pedis pulses are 2+ on the right side and 2+ on the left side.       Posterior tibial pulses are 2+ on the right side and 2+ on the left side.     Heart sounds: Normal heart sounds.  Pulmonary:     Effort: Pulmonary effort is normal. No respiratory distress.  Abdominal:     General: Abdomen is flat.     Palpations: Abdomen is soft.     Tenderness: There is no abdominal tenderness.  Musculoskeletal:        General: Normal range of motion.     Cervical back: Normal range of motion.      Right lower leg: No edema.     Left lower leg: No edema.  Skin:    General: Skin is warm and dry.     Comments: TTP left anterior thigh area in to the groin and down to the left knee. No rashes, wounds, masses, erythema, warmth, swelling, bruising, fluctuance, induration, or overlying skin changes.  No tenderness to the popliteal fossa, left lower calf, area.  DP and PT pulses intact and 2+.  Compartments soft.  Patient seems to be ambulatory with a steady gait.  Neurological:     General: No focal deficit present.     Mental Status: She is alert.  Psychiatric:        Mood and Affect: Mood normal.        Behavior: Behavior normal.     ED Results / Procedures / Treatments   Labs (all labs ordered are listed, but only abnormal results are displayed) Labs Reviewed  BASIC METABOLIC PANEL - Abnormal; Notable for the following components:      Result Value   Potassium 3.4 (*)    Glucose, Bld 170 (*)    Calcium 8.7 (*)    All other components within normal limits  CBC  TROPONIN I (HIGH SENSITIVITY)  TROPONIN I (HIGH SENSITIVITY)    EKG None  Radiology VAS [place] LOWER EXTREMITY VENOUS (DVT) (7a-7p)  Result Date: 06/10/2023  Lower Venous DVT Study Patient Name:  Bonnie Gordon  Date of Exam:   06/10/2023 Medical Rec #: [id number]          Accession #:    [id number] Date of Birth: [birth year]          Patient Gender: F Patient Age:   40 years Exam Location:  [facility] Procedure:      VAS [place] LOWER EXTREMITY VENOUS (DVT) Referring Phys: Maralyn Sago Kassi Esteve --------------------------------------------------------------------------------  Indications: Pain.  Comparison Study: No previous exams Performing Technologist: Jody Hill RVT, RDMS  Examination Guidelines: A complete evaluation includes B-mode imaging, spectral Doppler, color Doppler, and power Doppler as needed of all accessible portions of each vessel. Bilateral testing is considered an integral part of a complete examination. Limited  examinations for reoccurring indications may be performed as noted. The reflux portion of the exam is performed with the patient in reverse Trendelenburg.  +-----+---------------+---------+-----------+----------+--------------+ RIGHTCompressibilityPhasicitySpontaneityPropertiesThrombus Aging +-----+---------------+---------+-----------+----------+--------------+ CFV  Full           Yes      Yes                                 +-----+---------------+---------+-----------+----------+--------------+   +---------+---------------+---------+-----------+----------+--------------+  LEFT     CompressibilityPhasicitySpontaneityPropertiesThrombus Aging +---------+---------------+---------+-----------+----------+--------------+ CFV      Full           Yes      Yes                                 +---------+---------------+---------+-----------+----------+--------------+ SFJ      Full                                                        +---------+---------------+---------+-----------+----------+--------------+ FV Prox  Full           Yes      Yes                                 +---------+---------------+---------+-----------+----------+--------------+ FV Mid   Full           Yes      Yes                                 +---------+---------------+---------+-----------+----------+--------------+ FV DistalFull           Yes      Yes                                 +---------+---------------+---------+-----------+----------+--------------+ PFV      Full                                                        +---------+---------------+---------+-----------+----------+--------------+ POP      Full           Yes      Yes                                 +---------+---------------+---------+-----------+----------+--------------+ PTV      Full                                                         +---------+---------------+---------+-----------+----------+--------------+ PERO     Full                                                        +---------+---------------+---------+-----------+----------+--------------+     Summary: RIGHT: - No evidence of common femoral vein obstruction.   LEFT: - There is no evidence of deep vein thrombosis in the lower extremity.  - No cystic structure found in the popliteal fossa.  *See table(s) above for measurements and observations.    Preliminary    DG Chest 2 View  Result Date: 06/10/2023 CLINICAL DATA:  Right-sided chest pain for 1 week. EXAM: CHEST - 2 VIEW COMPARISON:  03/14/2019 FINDINGS: The heart size and mediastinal contours are within normal limits. Both lungs are clear. The visualized skeletal structures are unremarkable. IMPRESSION: No active cardiopulmonary disease. Electronically Signed   By: Danae Orleans M.D.   On: 06/10/2023 09:40    Procedures Procedures    Medications Ordered in ED Medications  methocarbamol (ROBAXIN) tablet 500 mg (500 mg Oral Given 06/10/23 0726)  potassium chloride SA (KLOR-CON M) CR tablet 40 mEq (40 mEq Oral Given 06/10/23 1610)    ED Course/ Medical Decision Making/ A&P                                 Medical Decision Making Amount and/or Complexity of Data Reviewed Labs: ordered. Radiology: ordered.  Risk Prescription drug management.   7 AM: Patient with left anterior leg pain x 1 week.  She is afebrile, nontoxic-appearing, and in no acute distress with reassuring vital signs.  Physical exam per above reveals TTP throughout the anterior upper thigh area from the groin to the knee without any visualized abnormalities, erythema, warmth, fluctuance, induration, swelling or bruising.  Good distal pulses and sensation.  Compartments are soft.  No cellulitic changes.  Discussed with patient, she is concerned for blood clot given her increased risk factors for hypertension and diabetes.  Given this, DVT  ultrasound ordered.  I did inform her that ultrasound does not arrive for an hour and a half longer and she is okay with waiting.  9AM: Called to the bedside, patient states that she is now having chest pain.  Pain is substernal in nature and does not radiate.  She denies any shortness of breath.  No cough or congestion.  Denies any history of similar symptoms previously. Labs already obtained including CBC and BMP that show K 3.4. Will give oral replacement. EKG, CXR, and troponin added. Given new onset while in the department, will need delta troponin. Will continue to monitor.  7:15 AM: Patients initial troponin 2, will await delta troponin. EKG shows sinus rhythm with RBBB consistent with previous.  9:00 AM: DVT US negative for DVT or other abnormalities. Unclear etiology of patients symptoms. Did discuss to keep an eye out for any rashes developing given potential for pre-shingles pain. No other emergent concerns regarding this. She is able to walk without issue.  9:30 AM: CXR shows NAD.  I have personally reviewed and interpreted this imaging and agree with radiology interpretation.  Patients delta troponin 3. Upon reassessment, patient does still have some discomfort but it has improved. She does note that she has had pain like this before and normally 'sleeps it off' and it resolves when she wakes up. She has no shortness of breath. Very low suspicion for PE/AAA. Discussed further evaluation, cardiology consult and potential admission for observation, however patient would rather go home with close outpatient follow-up and return precautions. She has an appointment with her pcp tomorrow morning.  Discussed the importance of addressing these concerns. Evaluation and diagnostic testing in the emergency department does not suggest an emergent condition requiring admission or immediate intervention beyond what has been performed at this time.  Plan for discharge with close PCP follow-up.  Patient is  understanding and amenable with plan, educated on red flag symptoms that would prompt immediate return.  Patient discharged in stable condition.  Final Clinical Impression(s) / ED Diagnoses Final diagnoses:  Precordial chest pain  Left leg pain    Rx / DC Orders ED Discharge Orders     None     An After Visit Summary was printed and given to the patient.     Vear Clock 06/10/23 1433    Tegeler, Canary Brim, MD 06/10/23 1537

## 2023-06-10 NOTE — ED Triage Notes (Signed)
Pt arrived via pov presenting with left groin/leg pain in inner thigh that spreads across top of thigh. Pt reports pain started last Sunday 10/27, that has gradually worsened throughout the week. Pt wanted to be seen with concern since she is diabetic. A&Ox4. Denies any additional symptoms.

## 2023-06-11 ENCOUNTER — Encounter: Payer: Self-pay | Admitting: Student

## 2023-06-11 ENCOUNTER — Ambulatory Visit (INDEPENDENT_AMBULATORY_CARE_PROVIDER_SITE_OTHER): Payer: Medicare HMO | Admitting: Student

## 2023-06-11 VITALS — BP 132/80 | HR 99 | Ht 66.0 in | Wt 177.4 lb

## 2023-06-11 DIAGNOSIS — E1165 Type 2 diabetes mellitus with hyperglycemia: Secondary | ICD-10-CM

## 2023-06-11 DIAGNOSIS — R4589 Other symptoms and signs involving emotional state: Secondary | ICD-10-CM | POA: Diagnosis not present

## 2023-06-11 DIAGNOSIS — R202 Paresthesia of skin: Secondary | ICD-10-CM | POA: Diagnosis not present

## 2023-06-11 DIAGNOSIS — E1159 Type 2 diabetes mellitus with other circulatory complications: Secondary | ICD-10-CM | POA: Diagnosis not present

## 2023-06-11 DIAGNOSIS — Z7185 Encounter for immunization safety counseling: Secondary | ICD-10-CM | POA: Diagnosis not present

## 2023-06-11 LAB — POCT GLYCOSYLATED HEMOGLOBIN (HGB A1C): HbA1c, POC (controlled diabetic range): 6.6 % (ref 0.0–7.0)

## 2023-06-11 MED ORDER — LIDOCAINE HCL 2.5 % EX GEL: CUTANEOUS | 1 refills | Status: DC

## 2023-06-11 MED ORDER — SHINGRIX 50 MCG/0.5ML IM SUSR
0.5000 mL | Freq: Once | INTRAMUSCULAR | 0 refills | Status: AC
Start: 2023-06-11 — End: 2023-06-11

## 2023-06-11 NOTE — Patient Instructions (Addendum)
It was great to see you today! Thank you for choosing [facility] for your primary care.  Today we addressed: Use the lidocaine gel on the area and wear looser pants You can get the Shingrix vaccine at the pharmacy. You will need a 2nd shot 2-6 months after the first. This vaccine is important to help prevent shingles.   We will check A1C today  It looks like you wear taking Hydroxyzine   If you haven't already, sign up for My Chart to have easy access to your labs results, and communication with your primary care physician. We are checking some labs today. If they are abnormal, I will call you. If they are normal, I will send you a MyChart message (if it is active) or a letter in the mail. If you do not hear about your labs in the next 2 weeks, please call the office. I recommend that you always bring your medications to each appointment as this makes it easy to ensure you are on the correct medications and helps [place] not miss refills when you need them. Call the clinic at [phone] if your symptoms worsen or you have any concerns. Return in 3 weeks  Please arrive 15 minutes before your appointment to ensure smooth check in process.  We appreciate your efforts in making this happen.  Thank you for allowing me to participate in your care, Alfredo Martinez, MD 06/11/2023, 2:16 PM PGY-3, [facility]

## 2023-06-11 NOTE — Progress Notes (Unsigned)
    SUBJECTIVE:   CHIEF COMPLAINT / HPI:   Left inner thigh  Leg Tingling: Left sided groin/leg pain/numbness/tingling onset a week ago  Pants touching the leg bothers the leg/groin   Walking makes the inner thigh tingling and numbness worse  Compares it to symptoms similar to her tingling from peripheral neuropathy on dorsum of the foot  Has had back pain in the past not now  No bowel or bladder incontinence No weakness in the extremity No fever or chills  Worried about possibility of shingles but no rash  Seen in ED yesterday, DVT u/s negative  Had chest pain during that visit-received CXR, EKG, trops that were negative No chest pain today  Notes anxious mood as her husband has dementia and it is 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 Has taken Atarax in the past and is requesting to take again. No SI/HI.   PERTINENT  PMH / PSH:  CAD HTN RBBB DM2 - needs UTD A1C OSA HLD  Stressors at home    OBJECTIVE:   BP 132/80   Pulse 99   Ht 5\' 6"  (1.676 m)   Wt 177 lb 6.4 oz (80.5 kg)   SpO2 95%   BMI 28.63 kg/m   General: Alert and oriented in no apparent distress Heart: Regular rate and rhythm with no murmurs appreciated Lungs: Normal WOB  Abdomen: Bowel sounds present, no abdominal pain Skin: Warm and dry Extremity: LLE: no rash, no TTP, numbness over inner groin/inner thigh, strength 5/5 LE, normal gait without foot drop.  Hip: No pain with internal or external rotation, normal ROM   ASSESSMENT/PLAN:   Assessment & Plan Tingling Ddx: neuropathy, shingles? Unlikely without rash, does not appear to be secondary to OA/MSK/no trauma history. Not typical neuropathic distribution for meralgia paresthetica but will treat for neuropathic pain with lidocaine gel, instructed to wear loose pants for the timebeing. Strict return precautions and red flags discussed.  Vaccine counseling Requested Shingrex vaccine, ordered rx for this and will need second vaccine in 2-6 months.  Poorly  controlled type 2 diabetes mellitus with circulatory disorder (HCC) Recheck A1C to assess DM2.  Non-suicidal depressed mood Consider adding pharmacologic therapy at low dose and taper up if tolerated: Hydroxyzine but would definitely consider transition to SSRI on next visit.  Patient without SI/HI; denies active plan and reports that they are safe to continue with outpatient treatment.  Close follow up a few weeks       Alfredo Martinez, MD [facility]

## 2023-06-12 MED ORDER — HYDROXYZINE HCL 10 MG PO TABS: 10.0000 mg | ORAL_TABLET | Freq: Three times a day (TID) | ORAL | 0 refills | Status: DC | PRN

## 2023-06-12 NOTE — Assessment & Plan Note (Signed)
Consider adding pharmacologic therapy at low dose and taper up if tolerated: Hydroxyzine but would definitely consider transition to SSRI on next visit.  Patient without SI/HI; denies active plan and reports that they are safe to continue with outpatient treatment.  Close follow up a few weeks

## 2023-06-12 NOTE — Assessment & Plan Note (Signed)
Recheck A1C to assess DM2.

## 2023-06-13 ENCOUNTER — Other Ambulatory Visit: Payer: Self-pay | Admitting: Student

## 2023-06-13 ENCOUNTER — Other Ambulatory Visit: Payer: Self-pay | Admitting: Podiatrist

## 2023-06-13 ENCOUNTER — Telehealth: Payer: Self-pay

## 2023-06-13 DIAGNOSIS — I10 Essential (primary) hypertension: Secondary | ICD-10-CM

## 2023-06-13 MED ORDER — LIDOCAINE 5 % EX OINT: 1.0000 | TOPICAL_OINTMENT | CUTANEOUS | 0 refills | Status: DC | PRN

## 2023-06-13 NOTE — Telephone Encounter (Signed)
Received call from patient regarding issues in picking up lidocaine gel.   Called pharmacy. Pharmacist reports that lidocaine gel is not being manufactured anymore and recommends sending new prescription for Lidocaine 5% ointment.   Will forward to prescriber.   Veronda Prude, RN

## 2023-06-14 NOTE — Telephone Encounter (Signed)
Called patient and provided with update.   Bonnie Gordon C Armya Westerhoff, RN  

## 2023-06-15 ENCOUNTER — Ambulatory Visit: Payer: Medicare HMO | Admitting: Student

## 2023-06-22 ENCOUNTER — Encounter (INDEPENDENT_AMBULATORY_CARE_PROVIDER_SITE_OTHER): Payer: Self-pay

## 2023-06-22 ENCOUNTER — Telehealth: Payer: Self-pay

## 2023-06-22 NOTE — Telephone Encounter (Signed)
Patient calls nurse line reporting continued tingling in her leg and inner thigh.   She reports she has been doing her research and she believes her symptoms closely favor meralgia paresthetica.  She is interested to see if she needs to be referred to a specialist.   She reports she has been very uncomfortable and can not wait until FU apt with [facility] on 11/25.  She denies any weakness, saddle anesthesia or bladder/bowel incontinence.   Patient scheduled with Mabe on 11/19.

## 2023-06-26 ENCOUNTER — Ambulatory Visit (INDEPENDENT_AMBULATORY_CARE_PROVIDER_SITE_OTHER): Payer: Medicare HMO | Admitting: Family Medicine

## 2023-06-26 ENCOUNTER — Encounter: Payer: Self-pay | Admitting: Family Medicine

## 2023-06-26 VITALS — BP 128/74 | HR 92 | Wt 175.0 lb

## 2023-06-26 DIAGNOSIS — G5712 Meralgia paresthetica, left lower limb: Secondary | ICD-10-CM | POA: Diagnosis not present

## 2023-06-26 MED ORDER — CAPSAICIN 0.075 % EX STCK: 1.0000 | Freq: Four times a day (QID) | CUTANEOUS | 0 refills | Status: DC

## 2023-06-26 NOTE — Progress Notes (Cosign Needed Addendum)
    SUBJECTIVE:   CHIEF COMPLAINT / HPI:   Left thigh tingling/burning Has been going on now for about 3 weeks.  Previously seen in ED where DVT ultrasound was negative.  Seen here in the [facility] on 11/4 and felt to have some neuropathic pain.  She was given lidocaine gel and instructed to wear looser pants.  The pain has continued and is worse with movement.  She feels the pain makes her leg little weaker.  It is only on her thigh and nowhere else.  She has not noticed any rashes.  No trauma.  She does have a history of abdominal hysterectomy in 1994.  She also has a history of peripheral neuropathy for she takes gabapentin, but does not feel that has been helping.  PERTINENT  PMH / PSH: CAD, hypertension, type 2 diabetes, OSA, HLD  OBJECTIVE:   BP 128/74   Pulse 92   Wt 175 lb (79.4 kg)   SpO2 99%   BMI 28.25 kg/m   General: Alert and oriented, in NAD Skin: Warm, dry, and intact HEENT: NCAT, EOM grossly normal, midline nasal septum Cardiac: RRR, no m/r/g appreciated Respiratory: CTAB, breathing and speaking comfortably on RA Musculoskeletal: Left anterior thigh with pain to light touch and palpation, strength and sensation overall intact in left lower extremity, no difficulties with gait Neurological: No gross focal deficit Psychiatric: Appropriate mood and affect   ASSESSMENT/PLAN:   Meralgia paraesthetica, left History and exam most suggestive of meralgia paresthetica.  Reassured by normal strength on exam.  She is already on gabapentin which does not seem to help that much, and she has failed lidocaine gel.  Will send in capsaicin.  If this is also not effective, could consider increasing gabapentin dosing.  Continue wearing loose clothing.  Follow-up as needed.   Janeal Holmes, MD [facility]

## 2023-06-26 NOTE — Patient Instructions (Addendum)
I think you have a condition called meralgia paresthetica. Continue to wear loose-fitting pants. I will send in capsaicin cream to be applied to the area. If this does not help, let me know, and we can figure out something else that may help.

## 2023-06-26 NOTE — Assessment & Plan Note (Addendum)
History and exam most suggestive of meralgia paresthetica.  Reassured by normal strength on exam.  She is already on gabapentin which does not seem to help that much, and she has failed lidocaine gel.  Will send in capsaicin.  If this is also not effective, could consider increasing gabapentin dosing.  Continue wearing loose clothing.  Follow-up as needed.

## 2023-07-02 ENCOUNTER — Ambulatory Visit: Payer: Medicare HMO | Admitting: Student

## 2023-08-06 ENCOUNTER — Other Ambulatory Visit: Payer: Self-pay | Admitting: Student

## 2023-08-09 DIAGNOSIS — J0110 Acute frontal sinusitis, unspecified: Secondary | ICD-10-CM | POA: Diagnosis not present

## 2023-08-09 DIAGNOSIS — J029 Acute pharyngitis, unspecified: Secondary | ICD-10-CM | POA: Diagnosis not present

## 2023-08-09 DIAGNOSIS — R051 Acute cough: Secondary | ICD-10-CM | POA: Diagnosis not present

## 2023-08-17 ENCOUNTER — Ambulatory Visit: Payer: Medicare HMO | Admitting: Internal Medicine

## 2023-08-27 ENCOUNTER — Ambulatory Visit: Payer: Medicare HMO | Admitting: Internal Medicine

## 2023-08-27 NOTE — Progress Notes (Deleted)
Patient ID: Bonnie Gordon, female   DOB: 09-Jun-1956, 68 y.o.   MRN: [id number]  HPI: Bonnie Gordon is a 68 y.o.-year-old female, presenting for follow-up for DM2, dx in 2012, insulin-independent, uncontrolled, with complications (CAD, PN). Last visit 6 months ago. She established care with [facility] >> changed practices since then.  Interim history: No blurry vision, but does not see well at night.  She has anxiety related to husband has memory loss - he is in day care 3x a week. She works part time. She continues to exercise at the [facility]: 3x a week - walks a mile, then machines. She was in the emergency room with chest pain 06/10/2023.  Reviewed HbA1c levels: Lab Results  Component Value Date   HGBA1C 6.6 06/11/2023   HGBA1C 6.6 02/13/2023   HGBA1C 6.8 (A) 10/11/2022   HGBA1C 7.6 (A) 06/13/2022   HGBA1C 6.6 (A) 08/15/2021   HGBA1C 7.2 (A) 04/12/2021   HGBA1C 6.8 (A) 12/07/2020   HGBA1C 6.9 (A) 08/09/2020   HGBA1C 7.2 (A) 04/06/2020   HGBA1C 8.3 (A) 11/25/2019   HGBA1C 7.1 (A) 07/29/2019   HGBA1C 7.7 (A) 04/01/2019   HGBA1C 7.5 (A) 07/15/2018   HGBA1C 8.1 08/24/2017   HGBA1C 6.8 04/25/2017   HGBA1C 6.5 01/18/2017   HGBA1C 6.2 10/18/2016   HGBA1C 7.5 07/20/2016   HGBA1C 9.5 04/20/2016   HGBA1C 8.1 (H) 08/30/2015  04/13/2021 ([facility]) HbA1c 7.1%  11/2018: HbA1c 7.2% 01/14/2016: HbA1c 11.3%  Patient is on: - Farxiga 5 mg daily before breakfast - added 04/2021 - Ozempic 0.5 >> 1 mg weekly She was previously on: - Levemir up to 60 units at bedtime-stopped 11/2018 - Glipizide 5 mg before dinner  - Glimepiride 4 mg before b'fast/ Glipizide XL 10 mg in am  - Tradjenta 5 mg in am  - Victoza 1.8 mg daily These were stopped when she started insulin. She was on Metformin >> diarrhea.  Pt checks her sugars 1x-2x a day (a CGM was not approved for her): - am: 150-160s >> 95-130, ave 110 >> 102-144 - 2h after b'fast: 126 >> n/c >> 141 >> 167 - before lunch: 100, 120,  153 >> n/c >> 64 >> n/c - 2h after lunch: 167 (cake) >> n/c >> 117, 141 - before dinner:80-120 >> n/c >> 130 >> n/c - 2h after dinner: n/c >> 196 x1 >> n/c  - bedtime: 120-164 >> 135-140, 176 >> 130, 145 - nighttime: n/c >> 138 Lowest sugar was 64 >> .Marland KitchenMarland Kitchen  102; it is unclear at which level she has hypoglycemia awareness. Highest sugar was 409 (?) >>...  145  Glucometer: AccuChek Aviva  Meals: fish  - fried - once a month when she eats out; no red meat.  Salads with ranch dressing. Sweet tea - daily, and sodas - once a week.  -No CKD, last BUN/creatinine: Lab Results  Component Value Date   BUN 14 06/10/2023   BUN 8 07/13/2022   CREATININE 0.63 06/10/2023   CREATININE 0.76 07/13/2022   Lab Results  Component Value Date   MICRALBCREAT 20 05/18/2022  She is on olmesartan 20 mg daily.  -+ HL; last set of lipids: Lab Results  Component Value Date   CHOL 152 07/13/2022   HDL 57 07/13/2022   LDLCALC 72 07/13/2022   LDLDIRECT 92 04/17/2022   TRIG 134 07/13/2022   CHOLHDL 2.7 07/13/2022  04/13/2021 ([facility]): 154/183/61/75 01/13/2016: 147/218/73/69 On Lipitor 40 mg daily.  - last eye exam was in 02/2022:  no DR. She has incipient stable cataract. Sees [facility].  -+ Occasional numbness and tingling in her feet.  She sees podiatry.  Previously on Neurontin but ran out. I suggested alpha-lipoic acid and B complex in the past but she did not start.  Last foot exam 05/18/2022.  She was admitted with CP 05/2016 (found to have hypokalemia, mm cramps). On omeprazole -this is helping her GERD. She was in the emergency room with abd. pain 04/04/2021.  She was found to have kidney stones.  She tested positive for celiac disease.  She sees GI.  ROS: + See HPI  I reviewed pt's medications, allergies, PMH, social hx, family hx, and changes were documented in the history of present illness. Otherwise, unchanged from my initial visit note.  Past Medical History:  Diagnosis Date   Anxiety     Bipolar disorder (HCC)    Chest pain    a. 01/2003 MV w/ ? ant-inflat ischemia-->Cath: LAD 20p, otw nonobs dzs-->Med Rx; b. 2009 Cath: LAD 30, LCX nl/nondominant, RCA 30, nl EF; c. 08/2014 MV: no ischemia/infarct.   Complication of anesthesia    pt. states she is difficult to wake up   Depression    Diabetes mellitus    Type II   Diabetic neuropathy (HCC)    Diverticulitis    GERD (gastroesophageal reflux disease)    Headache    History of bronchitis    Hypercholesteremia    Hypertension    Hypokalemia    PONV (postoperative nausea and vomiting)    Shortness of breath dyspnea    Sleep apnea    Vertigo    patient reported   Past Surgical History:  Procedure Laterality Date   ABDOMINAL HYSTERECTOMY  1996   APPENDECTOMY  1990   CARDIAC CATHETERIZATION  2009   normal cath   CARDIAC CATHETERIZATION  2004   normal cath   CHOLECYSTECTOMY  1994   TONSILLECTOMY     TONSILLECTOMY/ADENOIDECTOMY/TURBINATE REDUCTION Bilateral 03/01/2015   Procedure: TONSILLECTOMY/TURBINATE REDUCTION;  Surgeon: Drema Halon, MD;  Location: [facility] OR;  Service: ENT;  Laterality: Bilateral;   UVULOPALATOPHARYNGOPLASTY Bilateral 03/01/2015   Procedure: UVULOPALATOPHARYNGOPLASTY (UPPP);  Surgeon: Drema Halon, MD;  Location: [facility] OR;  Service: ENT;  Laterality: Bilateral;   WISDOM TOOTH EXTRACTION     Social History   Social History   Marital status: Divorced    Spouse name: N/A   Number of children: 2   Occupational History   Disability    Social History Main Topics   Smoking status: Former Smoker    Quit date: 08/07/1982   Smokeless tobacco: Never Used   Alcohol use Yes     Comment: Drinks during the weekends 1 beer or 1 drink of liquor per day    Drug use: No   Sexual activity: Yes    Partners: Male    Birth control/ protection: Surgical   Current Outpatient Medications on File Prior to Visit  Medication Sig Dispense Refill   ACCU-CHEK GUIDE test strip USE AS INSTRUCTED TO CHECK  BLOOD SUGAR 2X DAILY 200 strip 3   aspirin EC 81 MG tablet Take 81 mg by mouth daily.     atorvastatin (LIPITOR) 40 MG tablet TAKE 1 TABLET BY MOUTH EVERY DAY (Patient taking differently: Take 40 mg by mouth at bedtime.) 90 tablet 3   Capsaicin 0.075 % STCK Apply 1 application  topically 4 (four) times daily. Apply to affected area on leg. 1 g 0   celecoxib (CELEBREX) 200 MG  capsule TAKE 1 CAPSULE (200 MG TOTAL) BY MOUTH DAILY AS NEEDED. (Patient taking differently: Take 200 mg by mouth at bedtime as needed for mild pain (pain score 1-3) or moderate pain (pain score 4-6).) 90 capsule 1   dapagliflozin propanediol (FARXIGA) 5 MG TABS tablet Take 1 tablet (5 mg total) by mouth daily before breakfast. 90 tablet 3   ezetimibe (ZETIA) 10 MG tablet Take 1 tablet (10 mg total) by mouth daily. (Patient taking differently: Take 10 mg by mouth at bedtime.) 90 tablet 3   gabapentin (NEURONTIN) 300 MG capsule Take 1 capsule (300 mg total) by mouth 2 (two) times daily. 180 capsule 0   hydrOXYzine (ATARAX) 10 MG tablet Take 1 tablet (10 mg total) by mouth 3 (three) times daily as needed. 30 tablet 0   lidocaine-prilocaine (EMLA) cream Apply topically as needed. 30 g 0   Olmesartan-amLODIPine-HCTZ 20-5-12.5 MG TABS TAKE 1 TABLET BY MOUTH EVERY DAY 90 tablet 1   Semaglutide, 1 MG/DOSE, (OZEMPIC, 1 MG/DOSE,) 4 MG/3ML SOPN INJECT 1MG  INTO THE SKIN ONCE A WEEK 9 mL 3   valACYclovir (VALTREX) 500 MG tablet Take 1 tablet (500 mg total) by mouth See admin instructions. Take 500 mg by mouth twice daily three days a week  M-w-f 90 tablet 1   No current facility-administered medications on file prior to visit.   Allergies  Allergen Reactions   Hyoscyamine Hives   Other Other (See Comments)    No Blood Products, personal preference   Latex Itching and Rash   Family History  Problem Relation Age of Onset   Diabetes Mother    Gout Mother    Hypertension Mother    Arthritis Mother    Heart disease Father    Heart  failure Sister    Arthritis Sister    Heart disease Maternal Grandmother    Diabetes Maternal Grandmother    Heart disease Paternal Grandmother    Diabetes Paternal Grandmother    Diabetes Sister    Bursitis Sister    Crohn's disease Sister    PE: There were no vitals taken for this visit. Wt Readings from Last 3 Encounters:  06/26/23 175 lb (79.4 kg)  06/11/23 177 lb 6.4 oz (80.5 kg)  06/10/23 174 lb (78.9 kg)   Constitutional: overweight, in NAD Eyes:  EOMI, no exophthalmos ENT: no neck masses, no cervical lymphadenopathy Cardiovascular: RRR, No MRG Respiratory: CTA B Musculoskeletal: no deformities Skin:no rashes Neurological: no tremor with outstretched hands  ASSESSMENT: 1. DM2, insulin-independent now, uncontrolled, with complications - CAD - PN  2. HL  3.  Overweight  PLAN:  1. Patient with longstanding, uncontrolled, type 2 diabetes, improved after changing GLP-1 receptor agonist from Victoza to Ozempic.  She also continues on an SGLT2 inhibitor.  We were able to stop her sulfonylurea.  At last visit, HbA1c was better, at 6.6%.  At that time sugars were within the target range or very slightly above.  We discussed about continuing exercise which she just started at the [facility] but I did not recommend a change in regimen.  In 06/2023, HbA1c was stable, at 6.6%.  - I suggested to:  Patient Instructions  Please continue: - Farxiga 5 mg daily before breakfast - Ozempic 1 mg weekly   Please return in 4 months with your sugar log.   - we checked her HbA1c: 7%  - advised to check sugars at different times of the day - 1x a day, rotating check times - advised for yearly  eye exams >> she is UTD - return to clinic in 4 months  2. HL -Reviewed latest lipid panel from 07/2022: LDL above our target of less than 55, otherwise fractions at goal: Lab Results  Component Value Date   CHOL 152 07/13/2022   HDL 57 07/13/2022   LDLCALC 72 07/13/2022   LDLDIRECT 92 04/17/2022    TRIG 134 07/13/2022   CHOLHDL 2.7 07/13/2022  -She is on Lipitor 40 mg daily, tolerated well -She is due for another lipid panel  3. Overweight -continue SGLT 2 inhibitor and GLP-1 receptor agonist which should also help with weight loss -Weight was approximately the same in the last 2 visits  Carlus Pavlov, MD PhD [facility] Endocrinology

## 2023-09-03 ENCOUNTER — Encounter: Payer: Self-pay | Admitting: Podiatry

## 2023-09-03 ENCOUNTER — Telehealth: Payer: Self-pay | Admitting: Podiatry

## 2023-09-03 ENCOUNTER — Ambulatory Visit (INDEPENDENT_AMBULATORY_CARE_PROVIDER_SITE_OTHER): Payer: Medicare HMO

## 2023-09-03 ENCOUNTER — Ambulatory Visit (INDEPENDENT_AMBULATORY_CARE_PROVIDER_SITE_OTHER): Payer: Medicare HMO | Admitting: Podiatry

## 2023-09-03 DIAGNOSIS — M2012 Hallux valgus (acquired), left foot: Secondary | ICD-10-CM

## 2023-09-03 DIAGNOSIS — E559 Vitamin D deficiency, unspecified: Secondary | ICD-10-CM

## 2023-09-03 NOTE — Telephone Encounter (Signed)
Pt called and wants to schedule her surgery with Dr.McDonald as soon as possible.

## 2023-09-03 NOTE — Progress Notes (Signed)
  Subjective:  Patient ID: Bonnie Gordon, female    DOB: 12-28-1955,  MRN: [id number]  Chief Complaint  Patient presents with   Foot Pain    "I don't know if it's the Bunion or the Neuropathy that is causing the pain."    68 y.o. female presents with the above complaint. History confirmed with patient.  She notes the pain has been worsening she has had quite a bit of difficulty finding shoes that are comfortable even soft sided shoes are not comfortable.  She even used a pad from her husband to cover the bunion that did not help.  Objective:  Physical Exam: warm, good capillary refill, no trophic changes or ulcerative lesions, normal DP and PT pulses, normal sensory exam, and hallux valgus deformity dorsal medial bunion.  Light touch and vibratory sensation intact.  Good range of motion of first MTP.  Pain over medial eminence   Radiographs: Multiple views x-ray of the right foot: Hallux valgus deformity with dorsal medial bunion, increased intermetatarsal angle and hallux abductus angle Assessment:   1. Hallux valgus of left foot   2. Vitamin D deficiency      Plan:  Patient was evaluated and treated and all questions answered.  Discussed the etiology and treatment including surgical and non surgical treatment for painful bunions..  She has exhausted all non surgical treatment prior to this visit including shoe gear changes and padding.  She desires surgical intervention. We discussed all risks including but not limited to: pain, swelling, infection, scar, numbness which may be temporary or permanent, chronic pain, stiffness, nerve pain or damage, wound healing problems, bone healing problems including delayed or non-union and recurrence. Specifically we discussed the following procedures: Bunionectomy with double osteotomy through a minimal incisional approach. Informed consent was signed today. Surgery will be scheduled at a mutually agreeable date. Information regarding this will  be forwarded to our surgery scheduler. In the interim until surgery I recommended utilizing as wide of shoes as possible, take NSAIDs or tylenol as tolerated for pain, and a bunion padding shield which can be purchased online.  Order placed for vitamin D and calcium level to be checked at [facility].  She does have stable CAD that has an annual echo and follow-up with cardiology, she has no functional deficits can perform all her ADLs without chest pain or shortness of breath does not use oxygen regularly.  No episodes of angina or SOB recently.  A1c is well-controlled and is 6.6%   Surgical plan:  Procedure: -Left foot MIS double osteotomy  Location: -[facility]  Anesthesia plan: -Sedation with block  Postoperative pain plan: -Tylenol 1000 mg every 6 hours, ibuprofen 600 mg every 6 hours, gabapentin 300 mg every 8 hours x5 days, oxycodone 5 mg 1-2 tabs every 6 hours only as needed  DVT prophylaxis: -She will restart aspirin postop  WB Restrictions / DME needs: -WB to heel in CAM boot postop   No follow-ups on file.

## 2023-09-13 ENCOUNTER — Emergency Department (HOSPITAL_COMMUNITY)
Admission: EM | Admit: 2023-09-13 | Discharge: 2023-09-13 | Disposition: A | Discharge status: Home / Self Care | Payer: Medicare HMO | Attending: Emergency Medicine | Admitting: Emergency Medicine

## 2023-09-13 ENCOUNTER — Encounter (HOSPITAL_COMMUNITY): Payer: Self-pay | Admitting: *Deleted

## 2023-09-13 ENCOUNTER — Emergency Department (HOSPITAL_COMMUNITY): Payer: Medicare HMO

## 2023-09-13 ENCOUNTER — Telehealth: Payer: Self-pay | Admitting: Podiatry

## 2023-09-13 ENCOUNTER — Other Ambulatory Visit: Payer: Self-pay

## 2023-09-13 DIAGNOSIS — R519 Headache, unspecified: Secondary | ICD-10-CM

## 2023-09-13 DIAGNOSIS — Z79899 Other long term (current) drug therapy: Secondary | ICD-10-CM | POA: Insufficient documentation

## 2023-09-13 DIAGNOSIS — Z7982 Long term (current) use of aspirin: Secondary | ICD-10-CM | POA: Diagnosis not present

## 2023-09-13 DIAGNOSIS — E119 Type 2 diabetes mellitus without complications: Secondary | ICD-10-CM | POA: Diagnosis not present

## 2023-09-13 DIAGNOSIS — R0789 Other chest pain: Secondary | ICD-10-CM | POA: Diagnosis not present

## 2023-09-13 DIAGNOSIS — I10 Essential (primary) hypertension: Secondary | ICD-10-CM | POA: Diagnosis not present

## 2023-09-13 DIAGNOSIS — R079 Chest pain, unspecified: Secondary | ICD-10-CM | POA: Diagnosis not present

## 2023-09-13 DIAGNOSIS — I2510 Atherosclerotic heart disease of native coronary artery without angina pectoris: Secondary | ICD-10-CM | POA: Insufficient documentation

## 2023-09-13 DIAGNOSIS — Z91040 Latex allergy status: Secondary | ICD-10-CM | POA: Insufficient documentation

## 2023-09-13 DIAGNOSIS — M545 Low back pain, unspecified: Secondary | ICD-10-CM | POA: Diagnosis not present

## 2023-09-13 LAB — BASIC METABOLIC PANEL
Anion gap: 9 (ref 5–15)
BUN: 10 mg/dL (ref 8–23)
CO2: 27 mmol/L (ref 22–32)
Calcium: 9.3 mg/dL (ref 8.9–10.3)
Chloride: 104 mmol/L (ref 98–111)
Creatinine, Ser: 0.59 mg/dL (ref 0.44–1.00)
GFR, Estimated: >60 mL/min (ref >60)
Glucose, Bld: 104 mg/dL — ABNORMAL HIGH (ref 70–99)
Potassium: 3.4 mmol/L — ABNORMAL LOW (ref 3.5–5.1)
Sodium: 140 mmol/L (ref 135–145)

## 2023-09-13 LAB — CBC
HCT: 42.3 % (ref 36.0–46.0)
Hemoglobin: 14.4 g/dL (ref 12.0–15.0)
MCH: 29.4 pg (ref 26.0–34.0)
MCHC: 34 g/dL (ref 30.0–36.0)
MCV: 86.5 fL (ref 80.0–100.0)
Platelets: 200 10*3/uL (ref 150–400)
RBC: 4.89 MIL/uL (ref 3.87–5.11)
RDW: 13.5 % (ref 11.5–15.5)
WBC: 5.4 10*3/uL (ref 4.0–10.5)
nRBC: 0 % (ref 0.0–0.2)

## 2023-09-13 LAB — TROPONIN I (HIGH SENSITIVITY)
Troponin I (High Sensitivity): 3 ng/L (ref <18)
Troponin I (High Sensitivity): 3 ng/L (ref <18)

## 2023-09-13 NOTE — ED Provider Notes (Signed)
 [place] EMERGENCY DEPARTMENT AT [facility] Provider Note   CSN: [id number] Arrival date & time: 09/13/23  0100     History  Chief Complaint  Patient presents with   Chest Pain   Headache    Bonnie Gordon is a 68 y.o. female.   Chest Pain Associated symptoms: headache   Headache  Patient has a history of coronary artery disease hypertension hyperlipidemia diabetes anxiety, headaches, bipolar disorder. Patient states she has been having trouble with headaches and chest pain for the last few days.  Patient states it is a pins-and-needles type discomfort in her chest.  Associated with her headaches her blood pressure has been elevated.  She has not had any trouble with her vision or speech.  No focal numbness or weakness.  Patient states she felt very fatigued and tired yesterday at work.  She has not had any troubles with fever.  No cough.  No vomiting or diarrhea.  Patient states her blood pressure was elevated up to 180s systolic yesterday.  This concerned her and brought her to the ED.  While patient has been waiting her symptoms have resolved.  Patient states she has had more stress recently.  Her husband is in a nursing facility for dementia.  She has been notified that he has been trying to kiss and hug other people there.  This is not like him    Home Medications Prior to Admission medications   Medication Sig Start Date End Date Taking? Authorizing Provider  ACCU-CHEK GUIDE test strip USE AS INSTRUCTED TO CHECK BLOOD SUGAR 2X DAILY 06/07/22   Trixie File, MD  aspirin  EC 81 MG tablet Take 81 mg by mouth daily.    [provider]  atorvastatin  (LIPITOR) 40 MG tablet TAKE 1 TABLET BY MOUTH EVERY DAY Patient taking differently: Take 40 mg by mouth at bedtime. 05/07/23   Orlando Pond, DO  Capsaicin  0.075 % STCK Apply 1 application  topically 4 (four) times daily. Apply to affected area on leg. 06/26/23   Tharon Lung, MD  celecoxib  (CELEBREX ) 200 MG  capsule TAKE 1 CAPSULE (200 MG TOTAL) BY MOUTH DAILY AS NEEDED. Patient taking differently: Take 200 mg by mouth at bedtime as needed for mild pain (pain score 1-3) or moderate pain (pain score 4-6). 05/07/23   Dahbura, Anton, DO  dapagliflozin  propanediol (FARXIGA ) 5 MG TABS tablet Take 1 tablet (5 mg total) by mouth daily before breakfast. 02/13/23   Trixie File, MD  ezetimibe  (ZETIA ) 10 MG tablet Take 1 tablet (10 mg total) by mouth daily. Patient taking differently: Take 10 mg by mouth at bedtime. 10/10/22   Loistine Sober, NP  gabapentin  (NEURONTIN ) 300 MG capsule Take 1 capsule (300 mg total) by mouth 2 (two) times daily. 06/14/23 09/12/23  Silva Juliene SAUNDERS, DPM  hydrOXYzine  (ATARAX ) 10 MG tablet Take 1 tablet (10 mg total) by mouth 3 (three) times daily as needed. 06/12/23   Bryan Bianchi, MD  lidocaine -prilocaine (EMLA) cream Apply topically as needed. 06/14/23   Dahbura, Anton, DO  Olmesartan -amLODIPine -HCTZ 20-5-12.5 MG TABS TAKE 1 TABLET BY MOUTH EVERY DAY 06/13/23   Dahbura, Anton, DO  Semaglutide , 1 MG/DOSE, (OZEMPIC , 1 MG/DOSE,) 4 MG/3ML SOPN INJECT 1MG  INTO THE SKIN ONCE A WEEK 10/11/22   Trixie File, MD  valACYclovir  (VALTREX ) 500 MG tablet Take 1 tablet (500 mg total) by mouth See admin instructions. Take 500 mg by mouth twice daily three days a week  M-w-f 06/13/23   Dahbura, Anton, DO  Allergies    Hyoscyamine, Other, and Latex    Review of Systems   Review of Systems  Cardiovascular:  Positive for chest pain.  Neurological:  Positive for headaches.    Physical Exam Updated Vital Signs BP (!) 142/91   Pulse 78   Temp 98.3 F (36.8 C)   Resp 18   Ht 1.676 m (5' 6)   Wt 79.4 kg   SpO2 99%   BMI 28.25 kg/m  Physical Exam Vitals and nursing note reviewed.  Constitutional:      General: She is not in acute distress.    Appearance: She is well-developed.  HENT:     Head: Normocephalic and atraumatic.     Right Ear: External ear normal.     Left Ear:  External ear normal.  Eyes:     General: No scleral icterus.       Right eye: No discharge.        Left eye: No discharge.     Conjunctiva/sclera: Conjunctivae normal.  Neck:     Trachea: No tracheal deviation.  Cardiovascular:     Rate and Rhythm: Normal rate and regular rhythm.  Pulmonary:     Effort: Pulmonary effort is normal. No respiratory distress.     Breath sounds: Normal breath sounds. No stridor. No wheezing or rales.  Abdominal:     General: Bowel sounds are normal. There is no distension.     Palpations: Abdomen is soft.     Tenderness: There is no abdominal tenderness. There is no guarding or rebound.  Musculoskeletal:        General: No tenderness or deformity.     Cervical back: Neck supple.  Skin:    General: Skin is warm and dry.     Findings: No rash.  Neurological:     General: No focal deficit present.     Mental Status: She is alert.     Cranial Nerves: No cranial nerve deficit, dysarthria or facial asymmetry.     Sensory: No sensory deficit.     Motor: No abnormal muscle tone or seizure activity.     Coordination: Coordination normal.  Psychiatric:        Mood and Affect: Mood normal.     ED Results / Procedures / Treatments   Labs (all labs ordered are listed, but only abnormal results are displayed) Labs Reviewed  BASIC METABOLIC PANEL - Abnormal; Notable for the following components:      Result Value   Potassium 3.4 (*)    Glucose, Bld 104 (*)    All other components within normal limits  CBC  TROPONIN I (HIGH SENSITIVITY)  TROPONIN I (HIGH SENSITIVITY)    EKG EKG Interpretation Date/Time:  Thursday September 13 2023 01:12:33 EST Ventricular Rate:  78 PR Interval:  180 QRS Duration:  118 QT Interval:  388 QTC Calculation: 442 R Axis:   51  Text Interpretation: Normal sinus rhythm Right bundle branch block Abnormal ECG When compared with ECG of 10-Jun-2023 10:30, No significant change was found Confirmed by Raford Lenis ([id number]) on  09/13/2023 2:52:58 AM  Radiology CT Head Wo Contrast Result Date: 09/13/2023 CLINICAL DATA:  Headache, new onset (Age >= 51y) EXAM: CT HEAD WITHOUT CONTRAST TECHNIQUE: Contiguous axial images were obtained from the base of the skull through the vertex without intravenous contrast. RADIATION DOSE REDUCTION: This exam was performed according to the departmental dose-optimization program which includes automated exposure control, adjustment of the mA and/or kV according to patient size  and/or use of iterative reconstruction technique. COMPARISON:  None Available. FINDINGS: Brain: Normal anatomic configuration. No abnormal intra or extra-axial mass lesion or fluid collection. No abnormal mass effect or midline shift. No evidence of acute intracranial hemorrhage or infarct. Ventricular size is normal. Cerebellum unremarkable. Vascular: Unremarkable Skull: Intact Sinuses/Orbits: Paranasal sinuses are clear. Orbits are unremarkable. Other: Mastoid air cells and middle ear cavities are clear. IMPRESSION: 1. No acute intracranial abnormality. Electronically Signed   By: Dorethia Molt M.D.   On: 09/13/2023 03:30   DG Chest 2 View Result Date: 09/13/2023 CLINICAL DATA:  Chest pain, low back pain, and headache. High blood pressure for 3-4 days. EXAM: CHEST - 2 VIEW COMPARISON:  06/10/2023 FINDINGS: Heart size and pulmonary vascularity are normal. Lungs are clear. No pleural effusion or pneumothorax. Mediastinal contours appear intact. Degenerative changes in the spine. Surgical clips in the right upper quadrant. IMPRESSION: No active cardiopulmonary disease. Electronically Signed   By: Elsie Gravely M.D.   On: 09/13/2023 01:55    Procedures Procedures    Medications Ordered in ED Medications - No data to display  ED Course/ Medical Decision Making/ A&P Clinical Course as of 09/13/23 0719  Thu Sep 13, 2023  0701 Labs reviewed.  CBC normal.  Metabolic panel normal.  Troponin normal [JK]  0701 CT Head Wo  Contrast Head CT without acute abnormalities [JK]  0702 DG Chest 2 View Chest x-ray without acute findings [JK]    Clinical Course User Index [JK] Randol Simmonds, MD             HEART Score: 3                    Medical Decision Making Problems Addressed: Atypical chest pain: acute illness or injury that poses a threat to life or bodily functions Hypertension, unspecified type: chronic illness or injury Nonintractable headache, unspecified chronicity pattern, unspecified headache type: acute illness or injury that poses a threat to life or bodily functions  Amount and/or Complexity of Data Reviewed Labs: ordered. Decision-making details documented in ED Course. Radiology: independent interpretation performed. Decision-making details documented in ED Course.   Patient presented to the ED with complaints of headache as well as chest pain  Chest pain atypical in nature.  Doubt ACS.  Low risk heart score.  Serial troponins normal.  No evidence of pneumonia or pneumothorax.  Patient without dyspnea.  Low suspicion for PE or other serious abnormality.  Headaches have been intermittent associated with her blood pressure.  Patient has normal neurologic exam.  No findings to suggest stroke.  CT scan does not show any signs of cerebral hemorrhage or mass.  Patient is not having any infectious symptoms to suggest meningitis.  She does have history of headaches.  Patient has had some increased stress recently as well as of elevated blood pressure.  In the ED her blood pressure has improved without any medications.  At the bedside now 130s over 80s.  Will have patient follow-up with her primary care doctor to recheck on her blood pressure.  Over-the-counter medications as needed for pain and discomfort.  Will provide a work note so she can rest for the next couple of days        Final Clinical Impression(s) / ED Diagnoses Final diagnoses:  Atypical chest pain  Hypertension, unspecified type   Nonintractable headache, unspecified chronicity pattern, unspecified headache type    Rx / DC Orders ED Discharge Orders     None  Randol Simmonds, MD 09/13/23 [phone]

## 2023-09-13 NOTE — ED Triage Notes (Signed)
 The pt is c/o a headache chest pain and high bp for 3-4 days  her bp has been high for 2 days

## 2023-09-13 NOTE — Telephone Encounter (Addendum)
 DOS-10/12/23  DOUBLE OSTEOTOMY [id number]  [employer] EFFECTIVE DATE-08/07/22  PER THE [place] PORTAL, PRIOR AUTH HAS BEEN APPROVED FOR CPT CODE 28413, GOOD FROM 10/12/23 - 01/12/24.  Authorization #[id number]  Tracking [id number]

## 2023-09-13 NOTE — Discharge Instructions (Signed)
 Take Tylenol  as needed for pain and discomfort.  Follow-up with your primary care doctor to recheck on your blood pressure.  Return as needed for worsening symptoms

## 2023-09-13 NOTE — ED Provider Triage Note (Signed)
 Emergency Medicine Provider Triage Evaluation Note  Kiannah Reichow , a 68 y.o. female  was evaluated in triage.  Pt complains of throbbing headache for the past 2 days, intermittent sharp chest pain for the past 2 days and noted elevated blood pressure today.  Denies neurologic deficits.  Review of Systems  Positive:  Negative:   Physical Exam  BP (!) 166/94 (BP Location: Right Arm)   Pulse 76   Temp 98.1 F (36.7 C) (Oral)   Resp 16   Ht 5' 6 (1.676 m)   Wt 79.4 kg   SpO2 97%   BMI 28.25 kg/m  Gen:   Awake, no distress   Resp:  Normal effort  MSK:   Moves extremities without difficulty  Other:    Medical Decision Making  Medically screening exam initiated at 2:29 AM.  Appropriate orders placed.  Deb Dirr was informed that the remainder of the evaluation will be completed by another provider, this initial triage assessment does not replace that evaluation, and the importance of remaining in the ED until their evaluation is complete.     Arloa Chroman, PA-C 09/13/23 [phone]

## 2023-09-25 ENCOUNTER — Telehealth: Payer: Self-pay

## 2023-09-25 NOTE — Telephone Encounter (Signed)
Pt called and left a VM needing to schedule an appt.

## 2023-10-02 ENCOUNTER — Other Ambulatory Visit: Payer: Self-pay | Admitting: Internal Medicine

## 2023-10-02 ENCOUNTER — Ambulatory Visit (INDEPENDENT_AMBULATORY_CARE_PROVIDER_SITE_OTHER): Payer: Medicare HMO | Admitting: Student

## 2023-10-02 VITALS — BP 135/75 | HR 92 | Temp 98.2°F | Ht 66.0 in | Wt 172.0 lb

## 2023-10-02 DIAGNOSIS — E1165 Type 2 diabetes mellitus with hyperglycemia: Secondary | ICD-10-CM | POA: Diagnosis not present

## 2023-10-02 DIAGNOSIS — R519 Headache, unspecified: Secondary | ICD-10-CM | POA: Diagnosis not present

## 2023-10-02 DIAGNOSIS — E785 Hyperlipidemia, unspecified: Secondary | ICD-10-CM

## 2023-10-02 DIAGNOSIS — E1159 Type 2 diabetes mellitus with other circulatory complications: Secondary | ICD-10-CM

## 2023-10-02 LAB — POCT GLYCOSYLATED HEMOGLOBIN (HGB A1C): HbA1c, POC (controlled diabetic range): 6.5 % (ref 0.0–7.0)

## 2023-10-02 NOTE — Assessment & Plan Note (Signed)
 Well-controlled, A1c 6.5.  Continue Farxiga 5 mg daily, semaglutide 1 mg weekly.  Advised return for future lab for microalbumin/creatinine ratio given unable to urinate this morning.  Referral to ophthalmology placed.

## 2023-10-02 NOTE — Patient Instructions (Signed)
 It was great to see you today! Thank you for choosing [facility] for your primary care.  Today we addressed: Headaches: I would like you to try Tylenol 1000 mg every 6 hours as needed.  Please get your eyes checked, I have placed an ophthalmology referral.  Follow-up with me in 1 month or sooner if you begin to have new symptoms. Your diabetes and hypertension is well-controlled, no changes to medications.  Your A1c was 6.5.  Please call our lab when you are able to provide urine sample, I have placed the lab order for you to obtain this.  If you haven't already, sign up for My Chart to have easy access to your labs results, and communication with your primary care physician.  Return in about 1 month (around 10/30/2023). Please arrive 15 minutes before your appointment to ensure smooth check in process.  We appreciate your efforts in making this happen.  Thank you for allowing me to participate in your care, [name], DO 10/02/2023, 9:38 AM PGY-3, [facility]

## 2023-10-02 NOTE — Assessment & Plan Note (Signed)
 Recheck lipid panel.  Consider increasing atorvastatin to 80 mg daily if LDL > 70.  Currently on atorvastatin 40 mg daily, Zetia 10 mg daily.

## 2023-10-02 NOTE — Progress Notes (Signed)
  SUBJECTIVE:   CHIEF COMPLAINT / HPI:   Hypertension: Home medications include: Olmesartan-amlodipine-HCTZ 20-5-12.5 mg daily. She endorses taking these medications as prescribed. Does check blood pressure at home. Patient has had a BMP in the past 1 year.  Type 2 Diabetes: Home medications include: Farxiga 5 mg daily, semaglutide 1 mg weekly. Does endorse compliance. Patient is not up to date on diabetic eye.  Headaches: states she has been getting headaches every day. This has been since January. She used to have migraines but states these are different. She believes it may be stress because she put her husband in a nursing home due to his dementia. She is getting good sleep. She does not need CPAP anymore because she does not have sleep apnea anymore (as diagnosed by sleep center). She has not tried any medications. Headaches last about an hour and are all over the top of her head. BP in the morning is "good". She did change her diet to all vegetables. She is pooping and urinating regularly. Her fasting CBG between 97-104. She feels like her vision is not as clear as it usually is.   Hyperlipidemia: Home medications include atorvastatin 40 mg daily, Zetia 10 mg daily. Endorses compliance. Due for lipid panel.  PERTINENT  PMH / PSH: HTN, HLD, T2DM, CAD, OSA, RBBB  OBJECTIVE:  BP 135/75   Pulse 92   Temp 98.2 F (36.8 C)   Ht 5\' 6"  (1.676 m)   Wt 172 lb (78 kg)   SpO2 95%   BMI 27.76 kg/m  General: Well-appearing, NAD CV: RRR, no murmurs auscultated Pulm: CTAB, normal WOB Neuro: PERRL, EOMI, CN II through XII intact, normal strength of upper extremities  ASSESSMENT/PLAN:   Assessment & Plan Hyperlipidemia LDL goal <70 Recheck lipid panel.  Consider increasing atorvastatin to 80 mg daily if LDL > 70.  Currently on atorvastatin 40 mg daily, Zetia 10 mg daily. Poorly controlled type 2 diabetes mellitus with circulatory disorder (HCC) Well-controlled, A1c 6.5.  Continue Farxiga 5 mg  daily, semaglutide 1 mg weekly.  Advised return for future lab for microalbumin/creatinine ratio given unable to urinate this morning.  Referral to ophthalmology placed. Nonintractable headache, unspecified chronicity pattern, unspecified headache type Most likely tension headaches there has been no introduced medical intervention yet.  Recommend Tylenol 1000 mg every 6 hours as needed.  She does need her vision checked therefore I placed ophthalmology referral.  May be related to stress given recent life factors.  Advised to return in 1 month to readdress if this does not improve.  Other consideration may be return for obstructive sleep apnea requiring CPAP which she has needed in the past however she was advised by sleep medicine that she no longer has OSA.  Unfortunately, I do not have records to support this. Return in about 1 month (around 10/30/2023). [name], DO 10/02/2023, 1:44 PM PGY-3, [facility]

## 2023-10-03 LAB — MICROALBUMIN / CREATININE URINE RATIO
Creatinine, Urine: 112.8 mg/dL
Microalb/Creat Ratio: 9 mg/g{creat} (ref 0–29)
Microalbumin, Urine: 9.9 ug/mL

## 2023-10-03 LAB — LIPID PANEL
Chol/HDL Ratio: 2.7 {ratio} (ref 0.0–4.4)
Cholesterol, Total: 149 mg/dL (ref 100–199)
HDL: 56 mg/dL (ref >39)
LDL Chol Calc (NIH): 73 mg/dL (ref 0–99)
Triglycerides: 113 mg/dL (ref 0–149)
VLDL Cholesterol Cal: 20 mg/dL (ref 5–40)

## 2023-10-04 ENCOUNTER — Telehealth: Payer: Self-pay | Admitting: Student

## 2023-10-04 DIAGNOSIS — E785 Hyperlipidemia, unspecified: Secondary | ICD-10-CM

## 2023-10-04 MED ORDER — ATORVASTATIN CALCIUM 80 MG PO TABS
80.0000 mg | ORAL_TABLET | Freq: Every day | ORAL | 0 refills | Status: DC
Start: 2023-10-04 — End: 2024-01-01

## 2023-10-04 NOTE — Telephone Encounter (Signed)
 Patient identification verified by 2 forms. Marilynn Rail, RN    Called and spoke to patient  Advised patient to continue Zetia with Atorvastatin 80mg   Patient agrees, no questions at this time

## 2023-10-04 NOTE — Telephone Encounter (Signed)
 Patient called and said that her PCP wants to increase her atorvastatin to 80 MG. Patient wants to know if she can stop the Zetia 10 MG. Please call back to discuss

## 2023-10-04 NOTE — Telephone Encounter (Signed)
 Pt returned call

## 2023-10-04 NOTE — Telephone Encounter (Signed)
 Patient identification verified by 2 forms. Shade Flood, RN     Tried calling patient. No answer. LVMTCB

## 2023-10-04 NOTE — Telephone Encounter (Signed)
 Yes, she needs to continue zetia. If she stops zetia, she will not reach her LDL-C goal. She would be a candidate for Repatha (which is an injection), which would allow her to stop her zetia, but she would have to continue atorvastatin at 40mg .

## 2023-10-04 NOTE — Telephone Encounter (Signed)
 Called patient to discuss lab results.  Normal microalbumin/creatinine ratio.  LDL greater than 70 and due to CAD she has a strict goal of <70.  My recommendation is to increase atorvastatin to 80 mg daily.  She was hesitant with this although ultimately advised me to send in medication.  In the meantime I have recommended she contact cardiology as it has been almost a year since she has been seen and she will discuss with them if they recommend other measures for her cholesterol as she was hoping to not be on this and her Zetia.  23-month supply of atorvastatin 80 mg sent to pharmacy.

## 2023-10-12 ENCOUNTER — Other Ambulatory Visit: Payer: Self-pay | Admitting: Student

## 2023-10-12 ENCOUNTER — Other Ambulatory Visit: Payer: Self-pay | Admitting: Podiatry

## 2023-10-12 DIAGNOSIS — G8918 Other acute postprocedural pain: Secondary | ICD-10-CM | POA: Diagnosis not present

## 2023-10-12 DIAGNOSIS — M2012 Hallux valgus (acquired), left foot: Secondary | ICD-10-CM | POA: Diagnosis not present

## 2023-10-12 MED ORDER — OXYCODONE HCL 5 MG PO TABS: 5.0000 mg | ORAL_TABLET | ORAL | 0 refills | Status: AC | PRN

## 2023-10-12 MED ORDER — GABAPENTIN 300 MG PO CAPS: 300.0000 mg | ORAL_CAPSULE | Freq: Three times a day (TID) | ORAL | 0 refills | Status: DC

## 2023-10-12 MED ORDER — ACETAMINOPHEN 500 MG PO TABS
1000.0000 mg | ORAL_TABLET | Freq: Four times a day (QID) | ORAL | 0 refills | Status: AC | PRN
Start: 2023-10-12 — End: 2023-10-26

## 2023-10-12 NOTE — Progress Notes (Signed)
 10/12/23 left bunionectomy

## 2023-10-18 ENCOUNTER — Ambulatory Visit (INDEPENDENT_AMBULATORY_CARE_PROVIDER_SITE_OTHER)

## 2023-10-18 ENCOUNTER — Ambulatory Visit (INDEPENDENT_AMBULATORY_CARE_PROVIDER_SITE_OTHER): Payer: Medicare HMO | Admitting: Podiatry

## 2023-10-18 ENCOUNTER — Encounter: Payer: Self-pay | Admitting: Podiatry

## 2023-10-18 DIAGNOSIS — M21619 Bunion of unspecified foot: Secondary | ICD-10-CM

## 2023-10-18 DIAGNOSIS — M21612 Bunion of left foot: Secondary | ICD-10-CM | POA: Diagnosis not present

## 2023-10-18 NOTE — Progress Notes (Signed)
  Subjective:  Patient ID: Bonnie Gordon, female    DOB: May 05, 1956,  MRN: [id number]  Chief Complaint  Patient presents with   Routine Post Op    Patient states that she has had some pain in her toes of her left foot but other then that everything has been fine     DOS: 10/12/2023 Procedure: Bunionectomy left foot  68 y.o. female returns for post-op check.  Overall doing okay  Review of Systems: Negative except as noted in the HPI. Denies N/V/F/Ch.   Objective:  There were no vitals filed for this visit. There is no height or weight on file to calculate BMI. Constitutional Well developed. Well nourished.  Vascular Foot warm and well perfused. Capillary refill normal to all digits.  Calf is soft and supple, no posterior calf or knee pain, negative Homans' sign  Neurologic Normal speech. Oriented to person, place, and time. Epicritic sensation to light touch grossly present bilaterally.  Dermatologic Skin healing well without signs of infection. Skin edges well coapted without signs of infection.  Small abrasion anterior ankle  Orthopedic: Tenderness to palpation noted about the surgical site.   Multiple view plain film radiographs: Good correction noted no complication of hardware Assessment:   1. Bunion    Plan:  Patient was evaluated and treated and all questions answered.  S/p foot surgery left -Progressing as expected post-operatively. -XR: Noted above no complications -WB Status: WBAT in cam boot -Sutures: Return in 2 weeks to remove. -Medications: No refills required -Foot redressed. -May shower on Monday, encourage range of motion of the great toe and continue icing.  She has an upcoming trip to [place] at the end of the month advised her to reschedule, discussed with her that if she needs any documentation for this to let me know and I will be happy to complete this  No follow-ups on file.

## 2023-10-22 ENCOUNTER — Telehealth: Payer: Self-pay | Admitting: Podiatry

## 2023-10-22 NOTE — Telephone Encounter (Signed)
 Patient had surgery 10/12/23.  Still has swelling.  Is there anything else she can do besides putting ice on her foot?

## 2023-10-24 ENCOUNTER — Telehealth: Payer: Self-pay | Admitting: Podiatry

## 2023-10-24 NOTE — Telephone Encounter (Signed)
 Pt left message yesterday at 1207pm stating she is having swelling and bleeding and had bunion surgery 3/7. She left message 3/16 previously but has not heard anything yet.

## 2023-10-26 ENCOUNTER — Other Ambulatory Visit: Payer: Self-pay | Admitting: Cardiovascular Disease

## 2023-10-26 ENCOUNTER — Other Ambulatory Visit: Payer: Self-pay | Admitting: Student

## 2023-10-26 ENCOUNTER — Other Ambulatory Visit: Payer: Self-pay | Admitting: Podiatry

## 2023-10-26 DIAGNOSIS — M79671 Pain in right foot: Secondary | ICD-10-CM

## 2023-10-26 DIAGNOSIS — I10 Essential (primary) hypertension: Secondary | ICD-10-CM

## 2023-10-29 ENCOUNTER — Ambulatory Visit (INDEPENDENT_AMBULATORY_CARE_PROVIDER_SITE_OTHER): Payer: Medicare HMO | Admitting: Student

## 2023-10-29 VITALS — BP 105/80 | HR 92 | Ht 66.0 in | Wt 174.0 lb

## 2023-10-29 DIAGNOSIS — E785 Hyperlipidemia, unspecified: Secondary | ICD-10-CM | POA: Diagnosis not present

## 2023-10-29 DIAGNOSIS — R519 Headache, unspecified: Secondary | ICD-10-CM

## 2023-10-29 DIAGNOSIS — Z13820 Encounter for screening for osteoporosis: Secondary | ICD-10-CM

## 2023-10-29 DIAGNOSIS — Z780 Asymptomatic menopausal state: Secondary | ICD-10-CM | POA: Diagnosis not present

## 2023-10-29 NOTE — Progress Notes (Signed)
  SUBJECTIVE:   CHIEF COMPLAINT / HPI:   Headache follow-up: She notes headaches have resolved.  Health maintenance: She is working on setting up ophthalmology appointment.  She is due for DEXA scan.  Hyperlipidemia: She has been taking atorvastatin 80 mg daily, Zetia 10 mg daily.  Cardiology stated she may be a good candidate for Repatha if these medications do not reduce her LDL <70.  PERTINENT  PMH / PSH: HTN, HLD, T2DM, CAD, OSA, RBBB   OBJECTIVE:  BP 105/80   Pulse 92   Ht 5\' 6"  (1.676 m)   Wt 174 lb (78.9 kg)   SpO2 96%   BMI 28.08 kg/m  General: Well-appearing, NAD  ASSESSMENT/PLAN:   Assessment & Plan Hyperlipidemia LDL goal <70 Continue atorvastatin 80 mg daily, Zetia 10 mg daily.  Recheck lipid panel during lab visit in 1 month. Encounter for osteoporosis screening in asymptomatic postmenopausal patient DEXA scan. Nonintractable headache, unspecified chronicity pattern, unspecified headache type Resolved.  Return in about 1 month (around 11/29/2023) for lipid panel lab visit. [name], DO 10/29/2023, 1:43 PM PGY-3, [facility]

## 2023-10-29 NOTE — Assessment & Plan Note (Signed)
 Continue atorvastatin 80 mg daily, Zetia 10 mg daily.  Recheck lipid panel during lab visit in 1 month.

## 2023-10-29 NOTE — Patient Instructions (Signed)
 It was great to see you today! Thank you for choosing [facility] for your primary care.  Today we addressed: Lets get a DEXA scan to screen you for osteoporosis. Thank you for addressing your ophthalmology appointment. We will recheck your cholesterol in 1 month.  If you haven't already, sign up for My Chart to have easy access to your labs results, and communication with your primary care physician.  Return in about 1 month (around 11/29/2023) for lipid panel lab visit. Please arrive 15 minutes before your appointment to ensure smooth check in process.  We appreciate your efforts in making this happen.  Thank you for allowing me to participate in your care, [name], DO 10/29/2023, 1:42 PM PGY-3, [facility]

## 2023-10-30 ENCOUNTER — Ambulatory Visit (INDEPENDENT_AMBULATORY_CARE_PROVIDER_SITE_OTHER): Payer: Medicare HMO | Admitting: Podiatry

## 2023-10-30 ENCOUNTER — Encounter: Payer: Self-pay | Admitting: Podiatry

## 2023-10-30 VITALS — Ht 66.0 in | Wt 174.0 lb

## 2023-10-30 DIAGNOSIS — M2012 Hallux valgus (acquired), left foot: Secondary | ICD-10-CM

## 2023-10-30 MED ORDER — OXYCODONE HCL 5 MG PO TABS: 5.0000 mg | ORAL_TABLET | ORAL | 0 refills | Status: AC | PRN

## 2023-10-30 NOTE — Progress Notes (Signed)
  Subjective:  Patient ID: Bonnie Gordon, female    DOB: [birth year],  MRN: [id number]  Chief Complaint  Patient presents with   Routine Post Op     POV #2, DOS 10/12/23, BUNION CORRECTION OF LEFT FOOT. Pt states foot is still in pain, tries to stay off foot as much as possible, bruising to top of foot, pt think it is from the boot.    DOS: 10/12/2023 Procedure: Bunionectomy left foot  68 y.o. female returns for post-op check.  Still having some pain just taking Tylenol which is hard to control  Review of Systems: Negative except as noted in the HPI. Denies N/V/F/Ch.   Objective:  There were no vitals filed for this visit. Body mass index is 28.08 kg/m. Constitutional Well developed. Well nourished.  Vascular Foot warm and well perfused. Capillary refill normal to all digits.  Calf is soft and supple, no posterior calf or knee pain, negative Homans' sign  Neurologic Normal speech. Oriented to person, place, and time. Epicritic sensation to light touch grossly present bilaterally.  Dermatologic Incisions were healed.  Abrasion healing well  Orthopedic: Tenderness to palpation noted about the surgical site.  Moderate edema   Multiple view plain film radiographs: Good correction noted no complication of hardware Assessment:   1. Hallux valgus of left foot    Plan:  Patient was evaluated and treated and all questions answered.  S/p foot surgery left -Sutures moved uneventfully.  Continue weightbearing as tolerated in boot.  Compression sleeve applied.  Encourage range of motion of the MTP joint.  Continue icing.  Refill of oxycodone sent to pharmacy she is not able to take most NSAIDs due to her history of gastric reflux, she does have a prescription for Celebrex that she is able to tolerate and noted encouraged her to take this if she needs it for pain.  No follow-ups on file.

## 2023-11-01 ENCOUNTER — Encounter: Payer: Self-pay | Admitting: Internal Medicine

## 2023-11-01 ENCOUNTER — Ambulatory Visit (INDEPENDENT_AMBULATORY_CARE_PROVIDER_SITE_OTHER): Admitting: Internal Medicine

## 2023-11-01 VITALS — BP 124/70 | HR 88 | Ht 66.0 in | Wt 174.0 lb

## 2023-11-01 DIAGNOSIS — E1165 Type 2 diabetes mellitus with hyperglycemia: Secondary | ICD-10-CM

## 2023-11-01 DIAGNOSIS — Z7985 Long-term (current) use of injectable non-insulin antidiabetic drugs: Secondary | ICD-10-CM | POA: Diagnosis not present

## 2023-11-01 DIAGNOSIS — E785 Hyperlipidemia, unspecified: Secondary | ICD-10-CM | POA: Diagnosis not present

## 2023-11-01 DIAGNOSIS — Z7984 Long term (current) use of oral hypoglycemic drugs: Secondary | ICD-10-CM

## 2023-11-01 DIAGNOSIS — E1159 Type 2 diabetes mellitus with other circulatory complications: Secondary | ICD-10-CM

## 2023-11-01 MED ORDER — OZEMPIC (1 MG/DOSE) 4 MG/3ML ~~LOC~~ SOPN
1.0000 mg | PEN_INJECTOR | SUBCUTANEOUS | 3 refills | Status: AC
Start: 2023-11-01 — End: ?

## 2023-11-01 MED ORDER — DAPAGLIFLOZIN PROPANEDIOL 5 MG PO TABS: 5.0000 mg | ORAL_TABLET | Freq: Every day | ORAL | 3 refills | Status: AC

## 2023-11-01 NOTE — Patient Instructions (Addendum)
 Please continue: - Farxiga 5 mg daily before breakfast - Ozempic 1 mg weekly   Please return in 4-6 months with your sugar log.

## 2023-11-01 NOTE — Progress Notes (Signed)
 Patient ID: Bonnie Gordon, female   DOB: 08/27/1955, 68 y.o.   MRN: [id number]  HPI: Bonnie Gordon is a 68 y.o.-year-old female, presenting for follow-up for DM2, dx in 2012, insulin-independent, uncontrolled, with complications (CAD, PN). Last visit 68 months ago. She established care with [facility] >> changed practices since then.  Interim history: No blurry vision, nausea, increased urination. She has anxiety related to husband has memory loss - now in a home, but may need to bring him home 2/2 $. She works part time. She was in the emergency room with chest pain 06/10/2023 and 09/13/2023.  Troponins were not elevated. She had bunion surgery 2 weeks ago >> L foot in boot. She started exercises at the beginning of last year: 3x a week - walking a mile, then machines.  He has not been exercising lately due to foot pain, but plans to restart She started to switch to a plant based diet in last 2 mo (no meat).  Reviewed HbA1c levels: Lab Results  Component Value Date   HGBA1C 6.5 10/02/2023   HGBA1C 6.6 06/11/2023   HGBA1C 6.6 02/13/2023   HGBA1C 6.8 (A) 10/11/2022   HGBA1C 7.6 (A) 06/13/2022   HGBA1C 6.6 (A) 08/15/2021   HGBA1C 7.2 (A) 04/12/2021   HGBA1C 6.8 (A) 12/07/2020   HGBA1C 6.9 (A) 08/09/2020   HGBA1C 7.2 (A) 04/06/2020   HGBA1C 8.3 (A) 11/25/2019   HGBA1C 7.1 (A) 07/29/2019   HGBA1C 7.7 (A) 04/01/2019   HGBA1C 7.5 (A) 07/15/2018   HGBA1C 8.1 08/24/2017   HGBA1C 6.8 04/25/2017   HGBA1C 6.5 01/18/2017   HGBA1C 6.2 10/18/2016   HGBA1C 7.5 07/20/2016   HGBA1C 9.5 04/20/2016  04/13/2021 ([facility]) HbA1c 7.1%  11/2018: HbA1c 7.2% 01/14/2016: HbA1c 11.3%  Patient is on: - Farxiga 5 mg daily before breakfast - added 04/2021 - Ozempic 0.5 >> 1 mg weekly She was previously on: - Levemir up to 60 units at bedtime-stopped 11/2018 - Glipizide 5 mg before dinner  - Glimepiride 4 mg before b'fast/ Glipizide XL 10 mg in am  - Tradjenta 5 mg in am  - Victoza 1.8 mg  daily These were stopped when she started insulin. She was on Metformin >> diarrhea.  Pt checks her sugars 1x-2x a day (a CGM was not approved for her): - am:  150-160s >> 95-130, ave 110 >> 102-144 >> 97-105 - 2h after b'fast: 127 >> n/c >> 126 >> n/c >> 141 >> 167 >> n/c - before lunch: 100, 120, 153 >> n/c >> 64 >> n/c - 2h after lunch: n/c >> 167 (cake) >> n/c >> 117, 141 >> n/c - before dinner: 68, 113 >> 80-120 >> n/c >> 130 >> n/c - 2h after dinner: n/c >> 196 x1 >> n/c  - bedtime: 120-164 >> 135-140, 176 >> 130, 145 >> 110s - nighttime: n/c >> 138 Lowest sugar was 64 >> ...  84 >> 102 >> 97; it is unclear at which level she has hypoglycemia awareness. Highest sugar was 409 (?) >>... 176 (x1 - sweets, sodas) >> 145 >> 120  Glucometer: AccuChek [employer]  Meals: fish  - fried - once a month when she eats out; no red meat.  Salads with ranch dressing. Sweet tea - daily, and sodas - once a week.  -No CKD, last BUN/creatinine: Lab Results  Component Value Date   BUN 10 09/13/2023   BUN 14 06/10/2023   CREATININE 0.59 09/13/2023   CREATININE 0.63 06/10/2023   Lab  Results  Component Value Date   MICRALBCREAT 9 10/02/2023   MICRALBCREAT 20 05/18/2022  On olmesartan.  -+ HL; last set of lipids: Lab Results  Component Value Date   CHOL 149 10/02/2023   HDL 56 10/02/2023   LDLCALC 73 10/02/2023   LDLDIRECT 92 04/17/2022   TRIG 113 10/02/2023   CHOLHDL 2.7 10/02/2023  On Lipitor 80 mg daily.  Also, now on Zetia 10 mg daily)  - last eye exam was in 02/2022: no DR. She has incipient stable cataract. Sees [facility].  -+ Occasional numbness and tingling in her feet.  She sees podiatry.  Previously on Neurontin but ran out. I suggested alpha-lipoic acid and B complex in the past but she did not start.  Last foot exam 10/30/2023 by Dr. Lilian Kapur.  She was admitted with CP 05/2016 (found to have hypokalemia, mm cramps). On omeprazole -this is helping her GERD. She was in the  emergency room with abd. pain 04/04/2021.  She was found to have kidney stones.  She tested positive for celiac disease.  She sees GI.  ROS: + See HPI  I reviewed pt's medications, allergies, PMH, social hx, family hx, and changes were documented in the history of present illness. Otherwise, unchanged from my initial visit note.  Past Medical History:  Diagnosis Date   Anxiety    Bipolar disorder (HCC)    Chest pain    a. 01/2003 MV w/ ? ant-inflat ischemia-->Cath: LAD 20p, otw nonobs dzs-->Med Rx; b. 2009 Cath: LAD 30, LCX nl/nondominant, RCA 30, nl EF; c. 08/2014 MV: no ischemia/infarct.   Complication of anesthesia    pt. states she is difficult to wake up   Depression    Diabetes mellitus    Type II   Diabetic neuropathy (HCC)    Diverticulitis    GERD (gastroesophageal reflux disease)    Headache    History of bronchitis    Hypercholesteremia    Hypertension    Hypokalemia    PONV (postoperative nausea and vomiting)    Shortness of breath dyspnea    Sleep apnea    Vertigo    patient reported   Past Surgical History:  Procedure Laterality Date   ABDOMINAL HYSTERECTOMY  1996   APPENDECTOMY  1990   CARDIAC CATHETERIZATION  2009   normal cath   CARDIAC CATHETERIZATION  2004   normal cath   CHOLECYSTECTOMY  1994   TONSILLECTOMY     TONSILLECTOMY/ADENOIDECTOMY/TURBINATE REDUCTION Bilateral 03/01/2015   Procedure: TONSILLECTOMY/TURBINATE REDUCTION;  Surgeon: Drema Halon, MD;  Location: [facility] OR;  Service: ENT;  Laterality: Bilateral;   UVULOPALATOPHARYNGOPLASTY Bilateral 03/01/2015   Procedure: UVULOPALATOPHARYNGOPLASTY (UPPP);  Surgeon: Drema Halon, MD;  Location: [facility] OR;  Service: ENT;  Laterality: Bilateral;   WISDOM TOOTH EXTRACTION     Social History   Social History   Marital status: Divorced    Spouse name: N/A   Number of children: 2   Occupational History   Disability    Social History Main Topics   Smoking status: Former Smoker    Quit  date: 08/07/1982   Smokeless tobacco: Never Used   Alcohol use Yes     Comment: Drinks during the weekends 1 beer or 1 drink of liquor per day    Drug use: No   Sexual activity: Yes    Partners: Male    Birth control/ protection: Surgical   Current Outpatient Medications on File Prior to Visit  Medication Sig Dispense Refill   ACCU-CHEK GUIDE  test strip USE AS INSTRUCTED TO CHECK BLOOD SUGAR 2X DAILY 200 strip 3   aspirin EC 81 MG tablet Take 81 mg by mouth daily.     atorvastatin (LIPITOR) 80 MG tablet Take 1 tablet (80 mg total) by mouth daily. 90 tablet 0   Capsaicin 0.075 % STCK Apply 1 application  topically 4 (four) times daily. Apply to affected area on leg. 1 g 0   celecoxib (CELEBREX) 200 MG capsule Take 1 capsule (200 mg total) by mouth at bedtime as needed for mild pain (pain score 1-3) or moderate pain (pain score 4-6). 90 capsule 0   dapagliflozin propanediol (FARXIGA) 5 MG TABS tablet Take 1 tablet (5 mg total) by mouth daily before breakfast. 90 tablet 3   ezetimibe (ZETIA) 10 MG tablet TAKE 1 TABLET BY MOUTH EVERY DAY 30 tablet 0   gabapentin (NEURONTIN) 300 MG capsule Take 1 capsule (300 mg total) by mouth 2 (two) times daily. 180 capsule 0   gabapentin (NEURONTIN) 300 MG capsule Take 1 capsule (300 mg total) by mouth 3 (three) times daily for 7 days. 21 capsule 0   hydrOXYzine (ATARAX) 10 MG tablet Take 1 tablet (10 mg total) by mouth 3 (three) times daily as needed. 30 tablet 0   Olmesartan-amLODIPine-HCTZ 20-5-12.5 MG TABS TAKE 1 TABLET BY MOUTH EVERY DAY 90 tablet 1   oxyCODONE (OXY IR/ROXICODONE) 5 MG immediate release tablet Take 1 tablet (5 mg total) by mouth every 4 (four) hours as needed for up to 7 days for severe pain (pain score 7-10). 20 tablet 0   Semaglutide, 1 MG/DOSE, (OZEMPIC, 1 MG/DOSE,) 4 MG/3ML SOPN Inject 1 mg into the skin once a week. INJECT 1 MG INTO THE SKIN ONE TIME PER WEEK 6 mL 0   valACYclovir (VALTREX) 500 MG tablet TAKE 1 TABLET BY MOUTH 2 (TWO)  TIMES DAILY. FOR 3 DAYS WHEN HAVING ACTIVE INFECTION ONLY. 180 tablet 0   No current facility-administered medications on file prior to visit.   Allergies  Allergen Reactions   Hyoscyamine Hives   Other Other (See Comments)    No Blood Products, personal preference   Latex Itching and Rash   Family History  Problem Relation Age of Onset   Diabetes Mother    Gout Mother    Hypertension Mother    Arthritis Mother    Heart disease Father    Heart failure Sister    Arthritis Sister    Heart disease Maternal Grandmother    Diabetes Maternal Grandmother    Heart disease Paternal Grandmother    Diabetes Paternal Grandmother    Diabetes Sister    Bursitis Sister    Crohn's disease Sister    PE: BP 124/70   Pulse 88   Ht 5\' 6"  (1.676 m)   Wt 174 lb (78.9 kg)   SpO2 96%   BMI 28.08 kg/m  Wt Readings from Last 15 Encounters:  11/01/23 174 lb (78.9 kg)  10/30/23 174 lb (78.9 kg)  10/29/23 174 lb (78.9 kg)  10/02/23 172 lb (78 kg)  09/13/23 175 lb 0.7 oz (79.4 kg)  06/26/23 175 lb (79.4 kg)  06/11/23 177 lb 6.4 oz (80.5 kg)  06/10/23 174 lb (78.9 kg)  03/17/23 179 lb (81.2 kg)  03/12/23 179 lb 2 oz (81.3 kg)  02/13/23 175 lb 6.4 oz (79.6 kg)  10/11/22 177 lb 12.8 oz (80.6 kg)  10/10/22 177 lb 9.6 oz (80.6 kg)  07/13/22 175 lb 12.8 oz (79.7 kg)  06/20/22 175 lb (79.4 kg)   Constitutional: overweight, in NAD Eyes:  EOMI, no exophthalmos ENT: no neck masses, no cervical lymphadenopathy Cardiovascular: RRR, No MRG Respiratory: CTA B Musculoskeletal: no deformities, L foot in boot Skin:no rashes Neurological: no tremor with outstretched hands  ASSESSMENT: 1. DM2, insulin-independent now, uncontrolled, with complications - CAD - PN  2. HL  3.  Overweight  PLAN:  1. Patient with longstanding, uncontrolled, type 2 diabetes, with improved control after changing GLP-1 receptor agonist from Victoza to Ozempic.  She also continues on SGLT2 inhibitor.  We were able to  stop her sulfonylurea.  Sugars were further improved after stopping drinking sweet tea on a regular basis.  At last visit, HbA1c was lower, at 6.6% and she had another HbA1c obtained at the end of last month which was even better, at 6.5%. -At today's visit, sugars are wonderful, all at goal.  No need to change her regimen for now.  I refilled her prescriptions. - I suggested to:  Patient Instructions  Please continue: - Farxiga 5 mg daily before breakfast - Ozempic 1 mg weekly   Please return in 4-6 months with your sugar log.   - advised to check sugars at different times of the day - 1x a day, rotating check times - advised for yearly eye exams >> she is UTD - return to clinic in 4-6 months  2. HL -Latest lipid panel was reviewed from 09/2023: LDL improved, but still above target of less than 55 due to history of cardiovascular disease, otherwise fractions at goal: Lab Results  Component Value Date   CHOL 149 10/02/2023   HDL 56 10/02/2023   LDLCALC 73 10/02/2023   LDLDIRECT 92 04/17/2022   TRIG 113 10/02/2023   CHOLHDL 2.7 10/02/2023  -She is on Lipitor 80 mg daily and Zetia 10 mg daily (added recently) with good tolerance  3. Overweight -continue SGLT 2 inhibitor and GLP-1 receptor agonist which should also help with weight loss -Weight was approximately stable in the last 2 years  Carlus Pavlov, MD PhD [facility]

## 2023-11-08 ENCOUNTER — Other Ambulatory Visit: Payer: Self-pay | Admitting: Student

## 2023-11-08 DIAGNOSIS — Z1231 Encounter for screening mammogram for malignant neoplasm of breast: Secondary | ICD-10-CM

## 2023-11-09 ENCOUNTER — Ambulatory Visit (INDEPENDENT_AMBULATORY_CARE_PROVIDER_SITE_OTHER): Admitting: Student

## 2023-11-09 VITALS — BP 90/60 | HR 88 | Ht 66.0 in | Wt 171.6 lb

## 2023-11-09 DIAGNOSIS — J069 Acute upper respiratory infection, unspecified: Secondary | ICD-10-CM | POA: Diagnosis not present

## 2023-11-09 MED ORDER — GUAIFENESIN ER 600 MG PO TB12: 600.0000 mg | ORAL_TABLET | Freq: Two times a day (BID) | ORAL | 0 refills | Status: AC

## 2023-11-09 NOTE — Patient Instructions (Signed)
 Pleasure to meet you today.  Suspect your symptoms are most likely due to ongoing viral upper respiratory illness.  I recommend making sure you stay hydrated with enough water.  Also you can do Tylenol or ibuprofen for any aches and pain with the cough.  I eourage use of warm water, honey and lemon to help with the cough.  Have sent in prescription for Mucinex which you should take twice daily for at least 5-7 days.  Use the nasal saline provided to you today to irrigate your nose to help with the runny nose.

## 2023-11-09 NOTE — Progress Notes (Signed)
    SUBJECTIVE:   CHIEF COMPLAINT / HPI:   68 year old female presents with a week-long history of cold-like symptoms, which she initially thought had resolved after a previous episode two weeks ago. She reports persistent sneezing, body aches, and a runny nose. She also experienced a sore throat, which has since resolved. She denies having a fever, but notes feeling hot and unable to cool off on a few occasions. She has not noticed any changes in bowel movements, but reports a decreased appetite, having to force himself to eat. She denies smoking.  PERTINENT  PMH / PSH: Reviewed  OBJECTIVE:   BP 90/60   Pulse 88   Ht 5\' 6"  (1.676 m)   Wt 171 lb 9.6 oz (77.8 kg)   SpO2 100%   BMI 27.70 kg/m    Physical Exam General: Alert, well appearing, NAD HEENT: Dry mucous membrane, no oropharyngeal erythema or tonsillar exudate Cardiovascular: RRR, No Murmurs, Normal S2/S2 Respiratory: CTAB, No wheezing or Rales Abdomen: No distension or tenderness Extremities: No edema on extremities     ASSESSMENT/PLAN:    Viral Upper Respiratory Infection Symptoms suggest viral etiology, likely self-limiting. Supportive care advised. - Administer acetaminophen or ibuprofen every six hours for two days for myalgia and fever. - Provide guaifenesin for cough relief. - Advise warm water with honey and lemon for symptomatic relief. - Provide nasal spray sample for rhinorrhea. - Encourage increased fluid intake.  Borderline Hypotension Blood pressure borderline low, likely due to inadequate fluid intake and current antihypertensive medication. - Encourage increased fluid intake. - Patient advised to do BP daily and if continually low to reassess blood pressure medication  Jerre Simon, MD [facility]

## 2023-11-10 ENCOUNTER — Ambulatory Visit
Admission: RE | Admit: 2023-11-10 | Discharge: 2023-11-10 | Disposition: A | Discharge status: Home / Self Care | Source: Ambulatory Visit | Attending: Family Medicine | Admitting: Family Medicine

## 2023-11-10 DIAGNOSIS — Z1231 Encounter for screening mammogram for malignant neoplasm of breast: Secondary | ICD-10-CM | POA: Diagnosis not present

## 2023-11-20 ENCOUNTER — Ambulatory Visit (INDEPENDENT_AMBULATORY_CARE_PROVIDER_SITE_OTHER): Payer: Medicare HMO | Admitting: Podiatry

## 2023-11-20 ENCOUNTER — Encounter: Payer: Self-pay | Admitting: Podiatry

## 2023-11-20 ENCOUNTER — Ambulatory Visit (INDEPENDENT_AMBULATORY_CARE_PROVIDER_SITE_OTHER)

## 2023-11-20 DIAGNOSIS — M2012 Hallux valgus (acquired), left foot: Secondary | ICD-10-CM

## 2023-11-20 NOTE — Progress Notes (Signed)
  Subjective:  Patient ID: Bonnie Gordon, female    DOB: [birth year],  MRN: [id number]  Chief Complaint  Patient presents with   Routine Post Op    POV #3, DOS 10/12/23, BUNION CORRECTION OF LEFT FOOT "It's doing alright.  It's still numb and it still swells. My pain level is about a two, the pain comes and goes."    DOS: 10/12/2023 Procedure: Bunionectomy left foot  68 y.o. female returns for post-op check.   Pain is doing much better, swelling is still present  Review of Systems: Negative except as noted in the HPI. Denies N/V/F/Ch.   Objective:  There were no vitals filed for this visit. There is no height or weight on file to calculate BMI. Constitutional Well developed. Well nourished.  Vascular Foot warm and well perfused. Capillary refill normal to all digits.  Calf is soft and supple, no posterior calf or knee pain, negative Homans' sign  Neurologic Normal speech. Oriented to person, place, and time. Epicritic sensation to light touch grossly present bilaterally.  Dermatologic Incisions were healed.     Orthopedic: Mild pain to palpation noted about the surgical site.  Mild edema   Multiple view plain film radiographs: Good correction noted no complication of hardware Assessment:   1. Hallux valgus of left foot    Plan:  Patient was evaluated and treated and all questions answered.  S/p foot surgery left - Doing much better.  Continue ice and elevation.  Surgical shoe dispensed and she should begin weightbearing in this for the next 2 and half weeks and then can gradually return to supportive shoe gear such as a running shoe or sneaker.  Return in 6 weeks for final x-rays.  Return in about 6 weeks (around 01/01/2024) for post op (new x-rays).

## 2023-11-26 ENCOUNTER — Other Ambulatory Visit

## 2023-12-03 ENCOUNTER — Ambulatory Visit: Payer: Medicare HMO | Admitting: Internal Medicine

## 2023-12-12 ENCOUNTER — Telehealth: Payer: Self-pay | Admitting: Podiatry

## 2023-12-12 NOTE — Telephone Encounter (Signed)
 Patient is asking if she can go back to work.  If so, she needs a note stating she can go back to work.

## 2023-12-19 DIAGNOSIS — H5203 Hypermetropia, bilateral: Secondary | ICD-10-CM | POA: Diagnosis not present

## 2023-12-19 DIAGNOSIS — H524 Presbyopia: Secondary | ICD-10-CM | POA: Diagnosis not present

## 2023-12-19 DIAGNOSIS — H52223 Regular astigmatism, bilateral: Secondary | ICD-10-CM | POA: Diagnosis not present

## 2023-12-31 ENCOUNTER — Other Ambulatory Visit: Payer: Self-pay | Admitting: Student

## 2023-12-31 DIAGNOSIS — E785 Hyperlipidemia, unspecified: Secondary | ICD-10-CM

## 2024-01-01 ENCOUNTER — Ambulatory Visit (INDEPENDENT_AMBULATORY_CARE_PROVIDER_SITE_OTHER): Admitting: Podiatry

## 2024-01-01 ENCOUNTER — Encounter: Payer: Self-pay | Admitting: Podiatry

## 2024-01-01 ENCOUNTER — Ambulatory Visit (INDEPENDENT_AMBULATORY_CARE_PROVIDER_SITE_OTHER)

## 2024-01-01 VITALS — Ht <= 58 in | Wt 171.0 lb

## 2024-01-01 DIAGNOSIS — M2012 Hallux valgus (acquired), left foot: Secondary | ICD-10-CM | POA: Diagnosis not present

## 2024-01-01 DIAGNOSIS — Z9889 Other specified postprocedural states: Secondary | ICD-10-CM

## 2024-01-01 DIAGNOSIS — E559 Vitamin D deficiency, unspecified: Secondary | ICD-10-CM | POA: Diagnosis not present

## 2024-01-01 NOTE — Progress Notes (Signed)
  Subjective:  Patient ID: Bonnie Gordon, female    DOB: Apr 01, 1956,  MRN: [id number]  Chief Complaint  Patient presents with   Routine Post Op    POV #4, DOS 10/12/23, BUNION CORRECTION OF LEFT FOOT// post op (new x-rays). Patient states that sh is still having pain in her left foot where she had surgery at on the medial side. The pain comes and goes     DOS: 10/12/2023 Procedure: Bunionectomy left foot  68 y.o. female returns for post-op check.   She returns for follow-up  Review of Systems: Negative except as noted in the HPI. Denies N/V/F/Ch.   Objective:  There were no vitals filed for this visit. Body mass index is 834.9 kg/m. Constitutional Well developed. Well nourished.  Vascular Foot warm and well perfused. Capillary refill normal to all digits.  Calf is soft and supple, no posterior calf or knee pain, negative Homans' sign  Neurologic Normal speech. Oriented to person, place, and time. Epicritic sensation to light touch grossly present bilaterally.  Dermatologic Incisions were healed.     Orthopedic: Mild pain to palpation noted about the surgical site notably on the incisions but not on the osteotomy site.  Mild edema   Multiple view plain film radiographs: Good correction noted no complication of hardware, there is good increasing mineralization of the bone callus Assessment:   1. Post-operative state   2. Hallux valgus of left foot    Plan:  Patient was evaluated and treated and all questions answered.  S/p foot surgery left - Increasing mineralization noted on her x-rays still some healing to go I did recommend checking her vitamin D and calcium  level and orders for this were given to her which have previously been ordered.  Shoe gear and activity as tolerated she is cleared to go back to work at this point unless she has worsening symptoms.  She does have some tenderness over the incisions we discussed the possibility of neuritis here as well if not improved by  6 months would recommend corticosteroid injection.  Follow-up 3 months for new radiographs No follow-ups on file.

## 2024-01-02 LAB — VITAMIN D 25 HYDROXY (VIT D DEFICIENCY, FRACTURES): Vit D, 25-Hydroxy: 17.9 ng/mL — ABNORMAL LOW (ref 30.0–100.0)

## 2024-01-02 LAB — CALCIUM: Calcium: 10.2 mg/dL (ref 8.7–10.3)

## 2024-01-03 ENCOUNTER — Ambulatory Visit: Payer: Self-pay | Admitting: Podiatry

## 2024-01-03 MED ORDER — VITAMIN D (ERGOCALCIFEROL) 1.25 MG (50000 UNIT) PO CAPS: 50000.0000 [IU] | ORAL_CAPSULE | ORAL | 0 refills | Status: DC

## 2024-01-15 ENCOUNTER — Encounter: Payer: Self-pay | Admitting: *Deleted

## 2024-01-15 ENCOUNTER — Other Ambulatory Visit: Payer: Self-pay | Admitting: Cardiovascular Disease

## 2024-01-15 ENCOUNTER — Other Ambulatory Visit: Payer: Self-pay | Admitting: Podiatry

## 2024-01-15 ENCOUNTER — Other Ambulatory Visit: Payer: Self-pay | Admitting: Student

## 2024-01-22 ENCOUNTER — Other Ambulatory Visit: Payer: Self-pay | Admitting: Student

## 2024-01-22 DIAGNOSIS — M79671 Pain in right foot: Secondary | ICD-10-CM

## 2024-01-23 ENCOUNTER — Other Ambulatory Visit: Payer: Self-pay | Admitting: Cardiovascular Disease

## 2024-02-14 DIAGNOSIS — E1169 Type 2 diabetes mellitus with other specified complication: Secondary | ICD-10-CM | POA: Diagnosis not present

## 2024-02-14 DIAGNOSIS — E663 Overweight: Secondary | ICD-10-CM | POA: Diagnosis not present

## 2024-02-14 DIAGNOSIS — E785 Hyperlipidemia, unspecified: Secondary | ICD-10-CM | POA: Diagnosis not present

## 2024-02-14 DIAGNOSIS — I2510 Atherosclerotic heart disease of native coronary artery without angina pectoris: Secondary | ICD-10-CM | POA: Diagnosis not present

## 2024-02-14 DIAGNOSIS — F330 Major depressive disorder, recurrent, mild: Secondary | ICD-10-CM | POA: Diagnosis not present

## 2024-02-14 DIAGNOSIS — Z008 Encounter for other general examination: Secondary | ICD-10-CM | POA: Diagnosis not present

## 2024-02-14 DIAGNOSIS — I10 Essential (primary) hypertension: Secondary | ICD-10-CM | POA: Diagnosis not present

## 2024-02-14 DIAGNOSIS — Z6828 Body mass index (BMI) 28.0-28.9, adult: Secondary | ICD-10-CM | POA: Diagnosis not present

## 2024-02-23 DIAGNOSIS — M79672 Pain in left foot: Secondary | ICD-10-CM | POA: Diagnosis not present

## 2024-02-23 DIAGNOSIS — S8002XA Contusion of left knee, initial encounter: Secondary | ICD-10-CM | POA: Diagnosis not present

## 2024-02-23 DIAGNOSIS — S9032XA Contusion of left foot, initial encounter: Secondary | ICD-10-CM | POA: Diagnosis not present

## 2024-02-23 DIAGNOSIS — M25562 Pain in left knee: Secondary | ICD-10-CM | POA: Diagnosis not present

## 2024-02-23 DIAGNOSIS — W19XXXA Unspecified fall, initial encounter: Secondary | ICD-10-CM | POA: Diagnosis not present

## 2024-02-23 DIAGNOSIS — S39012A Strain of muscle, fascia and tendon of lower back, initial encounter: Secondary | ICD-10-CM | POA: Diagnosis not present

## 2024-02-26 ENCOUNTER — Ambulatory Visit (INDEPENDENT_AMBULATORY_CARE_PROVIDER_SITE_OTHER): Admitting: Podiatry

## 2024-02-26 ENCOUNTER — Encounter: Payer: Self-pay | Admitting: Podiatry

## 2024-02-26 ENCOUNTER — Ambulatory Visit (INDEPENDENT_AMBULATORY_CARE_PROVIDER_SITE_OTHER)

## 2024-02-26 DIAGNOSIS — M7752 Other enthesopathy of left foot: Secondary | ICD-10-CM | POA: Diagnosis not present

## 2024-02-26 DIAGNOSIS — M2012 Hallux valgus (acquired), left foot: Secondary | ICD-10-CM

## 2024-02-26 DIAGNOSIS — T148XXA Other injury of unspecified body region, initial encounter: Secondary | ICD-10-CM

## 2024-02-26 DIAGNOSIS — Z9889 Other specified postprocedural states: Secondary | ICD-10-CM

## 2024-02-26 MED ORDER — METHYLPREDNISOLONE 4 MG PO TBPK
ORAL_TABLET | ORAL | 0 refills | Status: DC
Start: 2024-02-26 — End: 2024-06-05

## 2024-02-26 NOTE — Progress Notes (Signed)
 Subjective:  Patient ID: Bonnie Gordon, female    DOB: 08-06-1956,  MRN: [id number]  Chief Complaint  Patient presents with   Foot Pain    Left foot pain. Fall on 02/23/24. Seen by urgent care with [facility] center. XR done, no fractures, just swollen and bruised. Here today due to stinging pain and bruising. Pain and bruising is about the same as it was Saturday. Swelling is present as well. She did have surgery with Dr. Silva in April of this year for bunion on same foot. There are 3 pins in the foot.     68 y.o. female presents with concern for pain on the left foot fell on 02/23/2024.  Prior bunion surgery done by Dr. Silva in the spring.  She reports she had a fall and subsequently had swelling and bruising noted to the great toe joint and the area around there.  Had minimally invasive bunion surgery done.  Of note she is diabetic has been taking vitamin D  supplementation due to concern for delayed healing of the surgical site.  Past Medical History:  Diagnosis Date   Anxiety    Bipolar disorder (HCC)    Chest pain    a. 01/2003 MV w/ ? ant-inflat ischemia-->Cath: LAD 20p, otw nonobs dzs-->Med Rx; b. 2009 Cath: LAD 30, LCX nl/nondominant, RCA 30, nl EF; c. 08/2014 MV: no ischemia/infarct.   Complication of anesthesia    pt. states she is difficult to wake up   Depression    Diabetes mellitus    Type II   Diabetic neuropathy (HCC)    Diverticulitis    GERD (gastroesophageal reflux disease)    Headache    History of bronchitis    Hypercholesteremia    Hypertension    Hypokalemia    PONV (postoperative nausea and vomiting)    Shortness of breath dyspnea    Sleep apnea    Vertigo    patient reported    Allergies  Allergen Reactions   Other Other (See Comments)    No Blood Products, personal preference   Hyoscyamine Hives and Rash   Latex Itching and Rash    ROS: Negative except as per HPI above  Objective:  General: AAO x3, NAD  Dermatological: With  inspection and palpation of the right and left lower extremities there are no open sores, no preulcerative lesions, no rash or signs of infection present. Nails are of normal length thickness and coloration.   Vascular:  Dorsalis Pedis artery and Posterior Tibial artery pedal pulses are 2/4 bilateral.  Capillary fill time < 3 sec to all digits.   Neruologic: Grossly intact via light touch bilateral. Protective threshold intact to all sites bilateral.   Musculoskeletal: Edema and ecchymosis noted to the left first MPJ as well as the hallux.  Pain with palpation of the first MPJ and with first MPJ range of motion.  Gait: Antalgic  No images are attached to the encounter.  Radiographs:  Date: 02/26/2024 XR the left foot Weightbearing AP/Lateral/Oblique   Findings: No evidence of fracture at the first tarsal there is no to be bone callus formation at the first metatarsal osteotomy site.  Does not appear that there was fracture through this area on lateral view though healing is still progressing.  Hardware is intact no evidence of hardware breakage or failure. Assessment:   1. Contusion of bone   2. Hallux valgus of left foot   3. Post-operative state      Plan:  Patient was evaluated  and treated and all questions answered.  # Left foot bone contusion status post prior left foot hallux valgus minimally invasive bunion correction - Discussed with patient that based on the imaging she does not have any fracture or hardware failure the screws are intact and there is no evidence of fracture. - Recommend conservative management with immobilization in postop shoe weightbearing as tolerated, she has a postop shoe at home from surgery still - Will send steroid Dosepak for pain relief. - Also recommend anti-inflammatory modalities such as elevation compression therapy with Ace wrap and icing - If pain does not decrease after a few weeks of conservative therapies call and we will get her back in for  further evaluation with Dr. Silva Marolyn JULIANNA Malvin, DPM [facility]

## 2024-03-06 ENCOUNTER — Encounter: Payer: Self-pay | Admitting: Student

## 2024-03-06 ENCOUNTER — Ambulatory Visit (INDEPENDENT_AMBULATORY_CARE_PROVIDER_SITE_OTHER): Admitting: Student

## 2024-03-06 VITALS — BP 122/80 | HR 78 | Ht 66.0 in | Wt 173.0 lb

## 2024-03-06 DIAGNOSIS — E1165 Type 2 diabetes mellitus with hyperglycemia: Secondary | ICD-10-CM

## 2024-03-06 DIAGNOSIS — E1159 Type 2 diabetes mellitus with other circulatory complications: Secondary | ICD-10-CM

## 2024-03-06 DIAGNOSIS — T148XXA Other injury of unspecified body region, initial encounter: Secondary | ICD-10-CM | POA: Diagnosis not present

## 2024-03-06 LAB — POCT GLYCOSYLATED HEMOGLOBIN (HGB A1C): HbA1c, POC (controlled diabetic range): 6.5 % (ref 0.0–7.0)

## 2024-03-06 MED ORDER — BACLOFEN 10 MG PO TABS: 10.0000 mg | ORAL_TABLET | Freq: Three times a day (TID) | ORAL | 0 refills | Status: DC | PRN

## 2024-03-06 NOTE — Progress Notes (Signed)
    SUBJECTIVE:   CHIEF COMPLAINT / HPI:   Bonnie Gordon is a 68 year old female with diabetes who presents with neck stiffness following a fall.  She experiences neck stiffness every morning since a fall two weeks ago, with pain rated at 10 out of 10 upon waking. The pain improves slightly throughout the day. She uses ice, heat, Celebrex  as needed, gabapentin  twice daily, and over-the-counter Tylenol  for pain management. There is no numbness, tingling in her hands, fevers, or chills. She has occasional headaches that do not disturb her sleep.  The fall occurred at her husband's workplace when she slipped on water. Initial x-rays were clear, but her toe turned purple, leading to further evaluation by her surgeon, who confirmed significant bruising without new fractures.  PERTINENT  PMH / PSH: Reviewed  OBJECTIVE:   BP 122/80   Pulse 78   Ht 5' 6 (1.676 m)   Wt 173 lb (78.5 kg)   SpO2 99%   BMI 27.92 kg/m    Physical Exam General: Alert, well appearing, NAD Neck: No notable deformity, limited range of motion and mild tenderness on the posterior side of the neck. Cardiovascular: RRR, No Murmurs, Normal S2/S2 Extremities: 5 out of 5 muscle strength in all extremities with sensation intact.  ASSESSMENT/PLAN:   Neck muscle strain Neck muscle strain likely from whiplash injury. Severe stiffness and pain, especially in the morning. Pain improves with ice, heat, and medications. Headaches present but not severe. - Prescribed baclofen  60 tablets, twice daily as needed, with instructions to take at night and in the morning. Omit morning dose if it causes sleepiness. - Continue Voltaren  gel application 3-4 times daily. - Continue Tylenol , especially at night with baclofen , and in the morning if needed. - Advise follow-up in two weeks if no improvement. - Reassured no neurologist referral needed as condition is likely muscle strain.  Norleen April, MD [facility]

## 2024-03-06 NOTE — Patient Instructions (Signed)
 Pleasure to meet you today.  Suspect your neck pain is due to muscle strain.  I have sent in prescription for baclofen  which is a muscle relaxer I recommend taking it 2 times daily 1 at night 1 in the morning.  If the morning dose makes you sleepy during the day you can discontinue the morning 1 but take them at night.  Make sure you are not driving when you take your night dose.  Also please apply Voltaren  gel over the tender area at least 3 times daily.  A1c today was 6.5% which is appropriate for your age.  Make sure you continue to eat adequate diet and stay active.

## 2024-03-13 ENCOUNTER — Ambulatory Visit: Payer: Self-pay | Admitting: Family Medicine

## 2024-03-13 ENCOUNTER — Ambulatory Visit: Payer: Medicare HMO

## 2024-03-13 VITALS — Ht 66.0 in | Wt 173.0 lb

## 2024-03-13 DIAGNOSIS — Z0000 Encounter for general adult medical examination without abnormal findings: Secondary | ICD-10-CM | POA: Diagnosis not present

## 2024-03-13 NOTE — Progress Notes (Signed)
 Because this visit was a virtual/telehealth visit,  certain criteria was not obtained, such a blood pressure, CBG if applicable, and timed get up and go. Any medications not marked as taking were not mentioned during the medication reconciliation part of the visit. Any vitals not documented were not able to be obtained due to this being a telehealth visit or patient was unable to self-report a recent blood pressure reading due to a lack of equipment at home via telehealth. Vitals that have been documented are verbally provided by the patient.   Subjective:   Bonnie Gordon is a 68 y.o. who presents for a Medicare Wellness preventive visit.  As a reminder, Annual Wellness Visits don't include a physical exam, and some assessments may be limited, especially if this visit is performed virtually. We may recommend an in-person follow-up visit with your provider if needed.  Visit Complete: Virtual I connected with  Bonnie Gordon on 03/13/24 by a audio enabled telemedicine application and verified that I am speaking with the correct person using two identifiers.  Patient Location: Home  Provider Location: Home Office  I discussed the limitations of evaluation and management by telemedicine. The patient expressed understanding and agreed to proceed.  Vital Signs: Because this visit was a virtual/telehealth visit, some criteria may be missing or patient reported. Any vitals not documented were not able to be obtained and vitals that have been documented are patient reported.  VideoDeclined- This patient declined Librarian, academic. Therefore the visit was completed with audio only.  Persons Participating in Visit: Patient.  AWV Questionnaire: No: Patient Medicare AWV questionnaire was not completed prior to this visit.  Cardiac Risk Factors include: advanced age (>80men, >57 women);sedentary lifestyle;diabetes mellitus;dyslipidemia;family history of premature  cardiovascular disease;hypertension     Objective:    Today's Vitals   03/13/24 0912  Weight: 173 lb (78.5 kg)  Height: 5' 6 (1.676 m)  PainSc: 0-No pain  PainLoc: Neck   Body mass index is 27.92 kg/m.     03/13/2024    9:15 AM 11/09/2023   10:47 AM 10/29/2023    1:31 PM 09/13/2023    1:13 AM 06/26/2023    1:49 PM 06/10/2023    1:47 AM 03/17/2023    4:54 PM  Advanced Directives  Does Patient Have a Medical Advance Directive? No No No No No No No  Would patient like information on creating a medical advance directive? No - Patient declined    No - Guardian declined  Yes (MAU/Ambulatory/Procedural Areas - Information given)    Current Medications (verified) Outpatient Encounter Medications as of 03/13/2024  Medication Sig   ACCU-CHEK GUIDE test strip USE AS INSTRUCTED TO CHECK BLOOD SUGAR 2X DAILY   aspirin  EC 81 MG tablet Take 81 mg by mouth daily.   atorvastatin  (LIPITOR) 80 MG tablet TAKE 1 TABLET BY MOUTH EVERY DAY   baclofen  (LIORESAL ) 10 MG tablet Take 1 tablet (10 mg total) by mouth 3 (three) times daily as needed for muscle spasms.   Capsaicin  0.075 % STCK Apply 1 application  topically 4 (four) times daily. Apply to affected area on leg.   celecoxib  (CELEBREX ) 200 MG capsule TAKE 1 CAPSULE (200 MG TOTAL) BY MOUTH AT BEDTIME AS NEEDED FOR MILD PAIN (PAIN SCORE 1-3) OR MODERATE PAIN (PAIN SCORE 4-6).   dapagliflozin  propanediol (FARXIGA ) 5 MG TABS tablet Take 1 tablet (5 mg total) by mouth daily before breakfast.   ezetimibe  (ZETIA ) 10 MG tablet TAKE 1 TABLET BY MOUTH  EVERY DAY   gabapentin  (NEURONTIN ) 300 MG capsule Take 1 capsule (300 mg total) by mouth 3 (three) times daily for 7 days.   gabapentin  (NEURONTIN ) 300 MG capsule TAKE 1 CAPSULE BY MOUTH TWICE A DAY   hydrOXYzine  (ATARAX ) 10 MG tablet Take 1 tablet (10 mg total) by mouth 3 (three) times daily as needed.   methylPREDNISolone  (MEDROL  DOSEPAK) 4 MG TBPK tablet Take as directed for 6 days   Olmesartan -amLODIPine -HCTZ  20-5-12.5 MG TABS TAKE 1 TABLET BY MOUTH EVERY DAY   omeprazole  (PRILOSEC) 40 MG capsule TAKE 1 CAPSULE BY MOUTH EVERY DAY   Semaglutide , 1 MG/DOSE, (OZEMPIC , 1 MG/DOSE,) 4 MG/3ML SOPN Inject 1 mg into the skin once a week. INJECT 1 MG INTO THE SKIN ONE TIME PER WEEK   valACYclovir  (VALTREX ) 500 MG tablet TAKE 1 TABLET BY MOUTH 2 (TWO) TIMES DAILY. FOR 3 DAYS WHEN HAVING ACTIVE INFECTION ONLY.   Vitamin D , Ergocalciferol , (DRISDOL ) 1.25 MG (50000 UNIT) CAPS capsule Take 1 capsule (50,000 Units total) by mouth every 7 (seven) days.   No facility-administered encounter medications on file as of 03/13/2024.    Allergies (verified) Other, Hyoscyamine, and Latex   History: Past Medical History:  Diagnosis Date   Anxiety    Bipolar disorder (HCC)    Chest pain    a. 01/2003 MV w/ ? ant-inflat ischemia-->Cath: LAD 20p, otw nonobs dzs-->Med Rx; b. 2009 Cath: LAD 30, LCX nl/nondominant, RCA 30, nl EF; c. 08/2014 MV: no ischemia/infarct.   Complication of anesthesia    pt. states she is difficult to wake up   Depression    Diabetes mellitus    Type II   Diabetic neuropathy (HCC)    Diverticulitis    GERD (gastroesophageal reflux disease)    Headache    History of bronchitis    Hypercholesteremia    Hypertension    Hypokalemia    PONV (postoperative nausea and vomiting)    Shortness of breath dyspnea    Sleep apnea    Vertigo    patient reported   Past Surgical History:  Procedure Laterality Date   ABDOMINAL HYSTERECTOMY  1996   APPENDECTOMY  1990   CARDIAC CATHETERIZATION  2009   normal cath   CARDIAC CATHETERIZATION  2004   normal cath   CHOLECYSTECTOMY  1994   TONSILLECTOMY     TONSILLECTOMY/ADENOIDECTOMY/TURBINATE REDUCTION Bilateral 03/01/2015   Procedure: TONSILLECTOMY/TURBINATE REDUCTION;  Surgeon: Lonni FORBES Angle, MD;  Location[facility] OR;  Service: ENT;  Laterality: Bilateral;   UVULOPALATOPHARYNGOPLASTY Bilateral 03/01/2015   Procedure: UVULOPALATOPHARYNGOPLASTY (UPPP);   Surgeon: Lonni FORBES Angle, MD;  Location: [facility] OR;  Service: ENT;  Laterality: Bilateral;   WISDOM TOOTH EXTRACTION     Family History  Problem Relation Age of Onset   Diabetes Mother    Gout Mother    Hypertension Mother    Arthritis Mother    Heart disease Father    Heart failure Sister    Arthritis Sister    Heart disease Maternal Grandmother    Diabetes Maternal Grandmother    Heart disease Paternal Grandmother    Diabetes Paternal Grandmother    Diabetes Sister    Bursitis Sister    Crohn's disease Sister    Social History   Socioeconomic History   Marital status: Married    Spouse name: Not on file   Number of children: Not on file   Years of education: some college   Highest education level: Not on file  Occupational History  Occupation: Retired Lawyer  Tobacco Use   Smoking status: Former    Current packs/day: 0.00    Types: Cigarettes    Quit date: 08/07/1982    Years since quitting: 41.6    Passive exposure: Past   Smokeless tobacco: Never  Vaping Use   Vaping status: Never Used  Substance and Sexual Activity   Alcohol use: Not Currently    Comment: occasional / social   Drug use: No   Sexual activity: Yes    Partners: Male    Birth control/protection: Surgical  Other Topics Concern   Not on file  Social History Narrative   Lives at home alone.   Right-handed.    1 cup caffeine per day.   Social Drivers of Corporate investment banker Strain: Low Risk  (03/13/2024)   Overall Financial Resource Strain (CARDIA)    Difficulty of Paying Living Expenses: Not hard at all  Food Insecurity: No Food Insecurity (03/13/2024)   Hunger Vital Sign    Worried About Running Out of Food in the Last Year: Never true    Ran Out of Food in the Last Year: Never true  Transportation Needs: No Transportation Needs (03/13/2024)   PRAPARE - Administrator, Civil Service (Medical): No    Lack of Transportation (Non-Medical): No  Physical Activity: Inactive  (03/13/2024)   Exercise Vital Sign    Days of Exercise per Week: 0 days    Minutes of Exercise per Session: 0 min  Stress: No Stress Concern Present (03/13/2024)   [employer] of Occupational Health - Occupational Stress Questionnaire    Feeling of Stress: Not at all  Social Connections: Moderately Integrated (03/13/2024)   Social Connection and Isolation Panel    Frequency of Communication with Friends and Family: More than three times a week    Frequency of Social Gatherings with Friends and Family: Three times a week    Attends Religious Services: 1 to 4 times per year    Active Member of Clubs or Organizations: No    Attends Banker Meetings: Never    Marital Status: Married    Tobacco Counseling Counseling given: Not Answered    Clinical Intake:  Pre-visit preparation completed: Yes  Pain : 0-10 Pain Score: 0-No pain Pain Type: Acute pain Pain Location: Neck     BMI - recorded: 27.92 Nutritional Status: BMI 25 -29 Overweight Nutritional Risks: None Diabetes: Yes CBG done?: No Did pt. bring in CBG monitor from home?: No  Lab Results  Component Value Date   HGBA1C 6.5 03/06/2024   HGBA1C 6.5 10/02/2023   HGBA1C 6.6 06/11/2023     How often do you need to have someone help you when you read instructions, pamphlets, or other written materials from your doctor or pharmacy?: 1 - Never What is the last grade level you completed in school?: HSG  Interpreter Needed?: No  Information entered by :: Roz Fuller, LPN.   Activities of Daily Living     03/13/2024    9:16 AM 03/17/2023    4:53 PM  In your present state of health, do you have any difficulty performing the following activities:  Hearing? 0 0  Vision? 0 0  Difficulty concentrating or making decisions? 0 0  Walking or climbing stairs? 0 0  Dressing or bathing? 0 0  Doing errands, shopping? 0 0  Preparing Food and eating ? N N  Using the Toilet? N N  In the past six months, have  you  accidently leaked urine? N N  Do you have problems with loss of bowel control? N N  Managing your Medications? N N  Managing your Finances? N N  Housekeeping or managing your Housekeeping? N N    Patient Care Team: Janna Ferrier, DO as PCP - General (Family Medicine) Burnard Debby LABOR, MD (Inactive) as PCP - Cardiology (Cardiology) Claudene Muskrat, OD (Optometry) Trixie File, MD as Consulting Physician (Internal Medicine) Silva, Juliene SAUNDERS, DPM as Consulting Physician (Podiatry)  I have updated your Care Teams any recent Medical Services you may have received from other providers in the past year.     Assessment:   This is a routine wellness examination for Bonnie Gordon.  Hearing/Vision screen Hearing Screening - Comments:: Patient has hearing aids, but does not use them. Vision Screening - Comments:: Wears rx glasses - up to date with routine eye exams with Muskrat Claudene, OD.    Goals Addressed             This Visit's Progress    03/13/2024: To maintain my health and prevent from falling.         Depression Screen     03/13/2024    9:22 AM 03/06/2024    8:54 AM 11/09/2023   10:43 AM 10/29/2023    1:31 PM 06/26/2023    1:50 PM 03/17/2023    4:51 PM 03/12/2023    8:51 AM  PHQ 2/9 Scores  PHQ - 2 Score 4 4 0 0 0 0 0  PHQ- 9 Score 11 11 4  0 0 3 3    Fall Risk     03/13/2024    9:13 AM 03/06/2024    8:54 AM 06/26/2023    1:50 PM 03/17/2023    4:52 PM 03/12/2023    8:51 AM  Fall Risk   Falls in the past year? 1 1 0 0 0  Number falls in past yr:  0 0 0 0  Injury with Fall?  1 0 0 0  Risk for fall due to :  History of fall(s) No Fall Risks No Fall Risks   Follow up  Falls evaluation completed Falls prevention discussed Falls prevention discussed;Education provided;Falls evaluation completed     MEDICARE RISK AT HOME:  Medicare Risk at Home Any stairs in or around the home?: No If so, are there any without handrails?: No Home free of loose throw rugs in walkways, pet beds,  electrical cords, etc?: Yes Adequate lighting in your home to reduce risk of falls?: Yes Life alert?: No Use of a cane, walker or w/c?: No Grab bars in the bathroom?: No Shower chair or bench in shower?: Yes Elevated toilet seat or a handicapped toilet?: No  TIMED UP AND GO:  Was the test performed?  No  Cognitive Function: Declined/Normal: No cognitive concerns noted by patient or family. Patient alert, oriented, able to answer questions appropriately and recall recent events. No signs of memory loss or confusion.    03/13/2024    9:17 AM  MMSE - Mini Mental State Exam  Not completed: Unable to complete        03/13/2024    9:14 AM 03/17/2023    4:54 PM  6CIT Screen  What Year? 0 points 0 points  What month? 0 points 0 points  What time? 0 points 0 points  Count back from 20 0 points 0 points  Months in reverse 0 points 0 points  Repeat phrase 0 points 0 points  Total Score  0 points 0 points    Immunizations Immunization History  Administered Date(s) Administered   Fluad Quad(high Dose 65+) 04/18/2022   Influenza, High Dose Seasonal PF 05/03/2020   Influenza,inj,Quad PF,6+ Mos 04/25/2017, 05/20/2018, 04/19/2019   PFIZER(Purple Top)SARS-COV-2 Vaccination 12/06/2019, 12/29/2019, 07/02/2020, 05/27/2021   PNEUMOCOCCAL CONJUGATE-20 06/20/2022   Pfizer(Comirnaty)Fall Seasonal Vaccine 12 years and older 06/20/2022   Pneumococcal Polysaccharide-23 07/02/2020   Tdap 07/14/2015    Screening Tests Health Maintenance  Topic Date Due   Zoster Vaccines- Shingrix  (1 of 2) Never done   DEXA SCAN  Never done   OPHTHALMOLOGY EXAM  02/22/2023   COVID-19 Vaccine (6 - 2024-25 season) 04/08/2023   INFLUENZA VACCINE  03/07/2024   HEMOGLOBIN A1C  09/06/2024   Diabetic kidney evaluation - eGFR measurement  09/12/2024   Diabetic kidney evaluation - Urine ACR  10/01/2024   FOOT EXAM  10/29/2024   Medicare Annual Wellness (AWV)  03/13/2025   DTaP/Tdap/Td (2 - Td or Tdap) 07/13/2025    MAMMOGRAM  11/09/2025   Colonoscopy  01/21/2032   Pneumococcal Vaccine: 50+ Years  Completed   Hepatitis C Screening  Completed   Hepatitis B Vaccines  Aged Out   HPV VACCINES  Aged Out   Meningococcal B Vaccine  Aged Out    Health Maintenance  Health Maintenance Due  Topic Date Due   Zoster Vaccines- Shingrix  (1 of 2) Never done   DEXA SCAN  Never done   OPHTHALMOLOGY EXAM  02/22/2023   COVID-19 Vaccine (6 - 2024-25 season) 04/08/2023   INFLUENZA VACCINE  03/07/2024   Health Maintenance Items Addressed: Yes Patient aware of current care gaps.  Immunization record was verified by [facility] and updated in patient's chart.  Additional Screening:  Vision Screening: Recommended annual ophthalmology exams for early detection of glaucoma and other disorders of the eye. Would you like a referral to an eye doctor? No  - Patient goes to [facility]  Dental Screening: Recommended annual dental exams for proper oral hygiene  Community Resource Referral / Chronic Care Management: CRR required this visit?  No   CCM required this visit?  No   Plan:    I have personally reviewed and noted the following in the patient's chart:   Medical and social history Use of alcohol, tobacco or illicit drugs  Current medications and supplements including opioid prescriptions. Patient is not currently taking opioid prescriptions. Functional ability and status Nutritional status Physical activity Advanced directives List of other physicians Hospitalizations, surgeries, and ER visits in previous 12 months Vitals Screenings to include cognitive, depression, and falls Referrals and appointments  In addition, I have reviewed and discussed with patient certain preventive protocols, quality metrics, and best practice recommendations. A written personalized care plan for preventive services as well as general preventive health recommendations were provided to patient.   Roz LOISE Fuller,  LPN   [id number]/[id number]   After Visit Summary: (MyChart) Due to this being a telephonic visit, the after visit summary with patients personalized plan was offered to patient via MyChart   Notes: Patient aware of current care gaps.  Immunization record was verified by [facility] and updated in patient's chart. Patient is due for vaccines.  Records requested from [facility].

## 2024-03-13 NOTE — Patient Instructions (Signed)
 Bonnie Gordon , Thank you for taking time out of your busy schedule to complete your Annual Wellness Visit with me. I enjoyed our conversation and look forward to speaking with you again next year. I, as well as your care team,  appreciate your ongoing commitment to your health goals. Please review the following plan we discussed and let me know if I can assist you in the future. Your Game plan/ To Do List    Referrals: If you haven't heard from the office you've been referred to, please reach out to them at the phone provided.   Follow up Visits: We will see or speak with you next year for your Next Medicare AWV with our clinical staff Have you seen your provider in the last 6 months (3 months if uncontrolled diabetes)? Yes  Clinician Recommendations:  Aim for 30 minutes of exercise or brisk walking, 6-8 glasses of water, and 5 servings of fruits and vegetables each day.       This is a list of the screenings recommended for you:  Health Maintenance  Topic Date Due   Zoster (Shingles) Vaccine (1 of 2) Never done   DEXA scan (bone density measurement)  Never done   Eye exam for diabetics  02/22/2023   COVID-19 Vaccine (6 - 2024-25 season) 04/08/2023   Flu Shot  03/07/2024   Hemoglobin A1C  09/06/2024   Yearly kidney function blood test for diabetes  09/12/2024   Yearly kidney health urinalysis for diabetes  10/01/2024   Complete foot exam   10/29/2024   Medicare Annual Wellness Visit  03/13/2025   DTaP/Tdap/Td vaccine (2 - Td or Tdap) 07/13/2025   Mammogram  11/09/2025   Colon Cancer Screening  01/21/2032   Pneumococcal Vaccine for age over 26  Completed   Hepatitis C Screening  Completed   Hepatitis B Vaccine  Aged Out   HPV Vaccine  Aged Out   Meningitis B Vaccine  Aged Out    Advanced directives: (Declined) Advance directive discussed with you today. Even though you declined this today, please call our office should you change your mind, and we can give you the proper  paperwork for you to fill out. Advance Care Planning is important because it:  [x]  Makes sure you receive the medical care that is consistent with your values, goals, and preferences  [x]  It provides guidance to your family and loved ones and reduces their decisional burden about whether or not they are making the right decisions based on your wishes.  Follow the link provided in your after visit summary or read over the paperwork we have mailed to you to help you started getting your Advance Directives in place. If you need assistance in completing these, please reach out to us  so that we can help you!  See attachments for Preventive Care and Fall Prevention Tips.

## 2024-03-15 ENCOUNTER — Other Ambulatory Visit: Payer: Self-pay

## 2024-03-15 ENCOUNTER — Encounter (HOSPITAL_COMMUNITY): Payer: Self-pay

## 2024-03-15 ENCOUNTER — Emergency Department (HOSPITAL_COMMUNITY): Admission: EM | Admit: 2024-03-15 | Discharge: 2024-03-15 | Disposition: A | Discharge status: Home / Self Care

## 2024-03-15 DIAGNOSIS — Z7982 Long term (current) use of aspirin: Secondary | ICD-10-CM | POA: Insufficient documentation

## 2024-03-15 DIAGNOSIS — U071 COVID-19: Secondary | ICD-10-CM | POA: Insufficient documentation

## 2024-03-15 DIAGNOSIS — Z91040 Latex allergy status: Secondary | ICD-10-CM | POA: Diagnosis not present

## 2024-03-15 DIAGNOSIS — R051 Acute cough: Secondary | ICD-10-CM

## 2024-03-15 DIAGNOSIS — R519 Headache, unspecified: Secondary | ICD-10-CM

## 2024-03-15 DIAGNOSIS — R059 Cough, unspecified: Secondary | ICD-10-CM | POA: Diagnosis present

## 2024-03-15 LAB — RESP PANEL BY RT-PCR (RSV, FLU A&B, COVID)  RVPGX2
Influenza A by PCR: NEGATIVE
Influenza B by PCR: NEGATIVE
Resp Syncytial Virus by PCR: NEGATIVE
SARS Coronavirus 2 by RT PCR: POSITIVE — AB

## 2024-03-15 MED ORDER — BUTALBITAL-APAP-CAFFEINE 50-325-40 MG PO TABS: 1.0000 | ORAL_TABLET | Freq: Four times a day (QID) | ORAL | 0 refills | Status: DC | PRN

## 2024-03-15 MED ORDER — KETOROLAC TROMETHAMINE 15 MG/ML IJ SOLN
15.0000 mg | Freq: Once | INTRAMUSCULAR | Status: AC
  Administered 2024-03-15: 15 mg via INTRAMUSCULAR
  Filled 2024-03-15: qty 1

## 2024-03-15 MED ORDER — BENZONATATE 100 MG PO CAPS: 100.0000 mg | ORAL_CAPSULE | Freq: Three times a day (TID) | ORAL | 0 refills | Status: DC

## 2024-03-15 NOTE — ED Provider Notes (Signed)
 [place] EMERGENCY DEPARTMENT AT [facility] Provider Note   CSN: [id number] Arrival date & time: 03/15/24  9495     Patient presents with: Cough   Bonnie Gordon is a 68 y.o. female.   68 year old female presents for evaluation of headache cough and viral upper respiratory symptoms for the last 2 days.  States has been taking some over-the-counter medication has not really been helping.  She is complaining mostly of headache and coughing.  Denies any other symptoms or concerns   Cough Associated symptoms: headaches   Associated symptoms: no chest pain, no chills, no ear pain, no fever, no rash, no shortness of breath and no sore throat        Prior to Admission medications   Medication Sig Start Date End Date Taking? Authorizing Provider  benzonatate  (TESSALON ) 100 MG capsule Take 1 capsule (100 mg total) by mouth every 8 (eight) hours. 03/15/24  Yes Annaleia Pence L, DO  butalbital -acetaminophen -caffeine  (FIORICET) 50-325-40 MG tablet Take 1-2 tablets by mouth every 6 (six) hours as needed for headache. 03/15/24 03/15/25 Yes Kedric Bumgarner L, DO  ACCU-CHEK GUIDE test strip USE AS INSTRUCTED TO CHECK BLOOD SUGAR 2X DAILY 06/07/22   Trixie File, MD  aspirin  EC 81 MG tablet Take 81 mg by mouth daily.    [provider]  atorvastatin  (LIPITOR) 80 MG tablet TAKE 1 TABLET BY MOUTH EVERY DAY 01/01/24   Dahbura, Anton, DO  baclofen  (LIORESAL ) 10 MG tablet Take 1 tablet (10 mg total) by mouth 3 (three) times daily as needed for muscle spasms. 03/06/24   Rosendo Rush, MD  Capsaicin  0.075 % STCK Apply 1 application  topically 4 (four) times daily. Apply to affected area on leg. 06/26/23   Tharon Lung, MD  celecoxib  (CELEBREX ) 200 MG capsule TAKE 1 CAPSULE (200 MG TOTAL) BY MOUTH AT BEDTIME AS NEEDED FOR MILD PAIN (PAIN SCORE 1-3) OR MODERATE PAIN (PAIN SCORE 4-6). 01/22/24   Christia Budds, MD  dapagliflozin  propanediol (FARXIGA ) 5 MG TABS tablet Take 1 tablet (5 mg  total) by mouth daily before breakfast. 11/01/23   Trixie File, MD  ezetimibe  (ZETIA ) 10 MG tablet TAKE 1 TABLET BY MOUTH EVERY DAY 01/24/24   Loistine Sober, NP  gabapentin  (NEURONTIN ) 300 MG capsule Take 1 capsule (300 mg total) by mouth 3 (three) times daily for 7 days. 10/12/23   McDonald, Juliene SAUNDERS, DPM  gabapentin  (NEURONTIN ) 300 MG capsule TAKE 1 CAPSULE BY MOUTH TWICE A DAY 01/16/24   McDonald, Adam R, DPM  hydrOXYzine  (ATARAX ) 10 MG tablet Take 1 tablet (10 mg total) by mouth 3 (three) times daily as needed. 06/12/23   Bryan Bianchi, MD  methylPREDNISolone  (MEDROL  DOSEPAK) 4 MG TBPK tablet Take as directed for 6 days 02/26/24   Malvin Marsa FALCON, DPM  Olmesartan -amLODIPine -HCTZ 20-5-12.5 MG TABS TAKE 1 TABLET BY MOUTH EVERY DAY 10/26/23   Orlando Pond, DO  omeprazole  (PRILOSEC) 40 MG capsule TAKE 1 CAPSULE BY MOUTH EVERY DAY 01/16/24   Dahbura, Anton, DO  Semaglutide , 1 MG/DOSE, (OZEMPIC , 1 MG/DOSE,) 4 MG/3ML SOPN Inject 1 mg into the skin once a week. INJECT 1 MG INTO THE SKIN ONE TIME PER WEEK 11/01/23   Trixie File, MD  valACYclovir  (VALTREX ) 500 MG tablet TAKE 1 TABLET BY MOUTH 2 (TWO) TIMES DAILY. FOR 3 DAYS WHEN HAVING ACTIVE INFECTION ONLY. 01/22/24   Christia Budds, MD  Vitamin D , Ergocalciferol , (DRISDOL ) 1.25 MG (50000 UNIT) CAPS capsule Take 1 capsule (50,000 Units total) by mouth every  7 (seven) days. 01/03/24   Silva Juliene SAUNDERS, DPM    Allergies: Other, Hyoscyamine, and Latex    Review of Systems  Constitutional:  Negative for chills and fever.  HENT:  Negative for ear pain and sore throat.   Eyes:  Negative for pain and visual disturbance.  Respiratory:  Positive for cough. Negative for shortness of breath.   Cardiovascular:  Negative for chest pain and palpitations.  Gastrointestinal:  Negative for abdominal pain and vomiting.  Genitourinary:  Negative for dysuria and hematuria.  Musculoskeletal:  Negative for arthralgias and back pain.  Skin:  Negative  for color change and rash.  Neurological:  Positive for headaches. Negative for seizures and syncope.  All other systems reviewed and are negative.   Updated Vital Signs BP 111/74 (BP Location: Right Arm)   Pulse (!) 107   Temp 98.4 F (36.9 C)   Resp 18   SpO2 94%   Physical Exam Vitals and nursing note reviewed.  Constitutional:      General: She is not in acute distress.    Appearance: Normal appearance. She is well-developed. She is not ill-appearing.  HENT:     Head: Normocephalic and atraumatic.  Eyes:     Conjunctiva/sclera: Conjunctivae normal.  Cardiovascular:     Rate and Rhythm: Normal rate and regular rhythm.     Pulses: Normal pulses.     Heart sounds: Normal heart sounds. No murmur heard. Pulmonary:     Effort: Pulmonary effort is normal. No respiratory distress.     Breath sounds: Normal breath sounds. No stridor. No wheezing or rhonchi.  Abdominal:     General: There is no distension.     Palpations: Abdomen is soft. There is no mass.     Tenderness: There is no abdominal tenderness.     Hernia: No hernia is present.  Musculoskeletal:        General: No swelling.     Cervical back: Neck supple.  Skin:    General: Skin is warm and dry.     Capillary Refill: Capillary refill takes less than 2 seconds.  Neurological:     Mental Status: She is alert.  Psychiatric:        Mood and Affect: Mood normal.     (all labs ordered are listed, but only abnormal results are displayed) Labs Reviewed  RESP PANEL BY RT-PCR (RSV, FLU A&B, COVID)  RVPGX2 - Abnormal; Notable for the following components:      Result Value   SARS Coronavirus 2 by RT PCR POSITIVE (*)    All other components within normal limits    EKG: None  Radiology: No results found.   Procedures   Medications Ordered in the ED  ketorolac  (TORADOL ) 15 MG/ML injection 15 mg (has no administration in time range)                                    Medical Decision Making Patient's  positive for COVID.  RSV flu negative.  Given Toradol  here.  Will prescribe Fioricet Tessalon  Perles to use for her symptoms and advise any other over-the-counter medication as needed.  She feels comfortable plan be discharged home.  Advised return for any new or worsening symptoms.  Problems Addressed: Acute cough: acute illness or injury COVID-19: self-limited or minor problem Nonintractable headache, unspecified chronicity pattern, unspecified headache type: acute illness or injury  Amount and/or Complexity of Data  Reviewed External Data Reviewed: notes.    Details: Prior outpatient records reviewed and patient has a history of diabetes for which she follows up in the office regularly Labs: ordered. Decision-making details documented in ED Course.    Details: Ordered and reviewed and patient is positive for covid  Risk OTC drugs. Prescription drug management.     Final diagnoses:  COVID-19  Nonintractable headache, unspecified chronicity pattern, unspecified headache type  Acute cough    ED Discharge Orders          Ordered    butalbital -acetaminophen -caffeine  (FIORICET) 50-325-40 MG tablet  Every 6 hours PRN        03/15/24 0707    benzonatate  (TESSALON ) 100 MG capsule  Every 8 hours        03/15/24 0707               Gennaro Bouchard L, DO 03/15/24 0710

## 2024-03-15 NOTE — ED Notes (Signed)
 Pt offered Tylenol  and/or Ibuprofen , but refuses at this time and wishes to wait.

## 2024-03-15 NOTE — Discharge Instructions (Addendum)
 You can take your Tessalon  Perles as needed for cough and Fioricet as needed for headaches.  You can use other over-the-counter medications like Mucinex  or Flonase  as needed for your symptoms.

## 2024-03-15 NOTE — ED Triage Notes (Signed)
 Pt reports nonproductive cough, sore throat, chills, body aches and dull headache x 1 day.

## 2024-03-25 ENCOUNTER — Encounter: Payer: Self-pay | Admitting: Internal Medicine

## 2024-03-25 ENCOUNTER — Ambulatory Visit (INDEPENDENT_AMBULATORY_CARE_PROVIDER_SITE_OTHER): Admitting: Internal Medicine

## 2024-03-25 VITALS — BP 120/86 | HR 83 | Ht 66.0 in | Wt 173.0 lb

## 2024-03-25 DIAGNOSIS — E785 Hyperlipidemia, unspecified: Secondary | ICD-10-CM

## 2024-03-25 DIAGNOSIS — Z7985 Long-term (current) use of injectable non-insulin antidiabetic drugs: Secondary | ICD-10-CM

## 2024-03-25 DIAGNOSIS — E1165 Type 2 diabetes mellitus with hyperglycemia: Secondary | ICD-10-CM | POA: Diagnosis not present

## 2024-03-25 DIAGNOSIS — E1159 Type 2 diabetes mellitus with other circulatory complications: Secondary | ICD-10-CM

## 2024-03-25 DIAGNOSIS — Z7984 Long term (current) use of oral hypoglycemic drugs: Secondary | ICD-10-CM | POA: Diagnosis not present

## 2024-03-25 NOTE — Patient Instructions (Signed)
 Please continue: - Farxiga 5 mg daily before breakfast - Ozempic 1 mg weekly   Please return in 4-6 months with your sugar log.

## 2024-03-25 NOTE — Progress Notes (Signed)
 Patient ID: Bonnie Gordon, female   DOB: 1956-05-26, 68 y.o.   MRN: [id number]  HPI: Bonnie Gordon is a 68 y.o.-year-old female, presenting for follow-up for DM2, dx in 2012, insulin -independent, uncontrolled, with complications (CAD, PN). Last visit 6 months ago.  Interim history: No blurry vision, nausea, increased urination. She has anxiety related to husband has memory loss - in a home. She works part time. She had COVID-19 03/15/2024 (was in the emergency room).  She thinks that her husband, who is still in the memory unit, also had it.  He is still not feeling back to normal.  Reviewed HbA1c levels: Lab Results  Component Value Date   HGBA1C 6.5 03/06/2024   HGBA1C 6.5 10/02/2023   HGBA1C 6.6 06/11/2023   HGBA1C 6.6 02/13/2023   HGBA1C 6.8 (A) 10/11/2022   HGBA1C 7.6 (A) 06/13/2022   HGBA1C 6.6 (A) 08/15/2021   HGBA1C 7.2 (A) 04/12/2021   HGBA1C 6.8 (A) 12/07/2020   HGBA1C 6.9 (A) 08/09/2020   HGBA1C 7.2 (A) 04/06/2020   HGBA1C 8.3 (A) 11/25/2019   HGBA1C 7.1 (A) 07/29/2019   HGBA1C 7.7 (A) 04/01/2019   HGBA1C 7.5 (A) 07/15/2018   HGBA1C 8.1 08/24/2017   HGBA1C 6.8 04/25/2017   HGBA1C 6.5 01/18/2017   HGBA1C 6.2 10/18/2016   HGBA1C 7.5 07/20/2016  04/13/2021 ([place]) HbA1c 7.1%  11/2018: HbA1c 7.2% 01/14/2016: HbA1c 11.3%  Patient is on: - Farxiga  5 mg daily before breakfast - added 04/2021 - Ozempic  0.5 >> 1 mg weekly She was previously on: - Levemir  up to 60 units at bedtime-stopped 11/2018 - Glipizide  5 mg before dinner  - Glimepiride  4 mg before b'fast/ Glipizide  XL 10 mg in am  - Tradjenta 5 mg in am  - Victoza  1.8 mg daily These were stopped when she started insulin . She was on Metformin >> diarrhea.  Pt checks her sugars 1-2x a day (a CGM was not approved for her): - am:  95-130, ave 110 >> 102-144 >> 97-105 >> 100-110 - 2h after b'fast: n/c >> 126 >> n/c >> 141 >> 167 >> n/c - before lunch: 100, 120, 153 >> n/c >> 64 >> n/c - 2h after lunch: n/c >>  167 (cake) >> n/c >> 117, 141 >> n/c - before dinner: 68, 113 >> 80-120 >> n/c >> 130 >> n/c - 2h after dinner: n/c >> 196 x1 >> n/c  - bedtime:135-140, 176 >> 130, 145 >> 110s >> 110-120 - nighttime: n/c >> 138 Lowest sugar was 64 >> ...  84 >> 102 >> 97 >> 90s; it is unclear at which level she has hypoglycemia awareness. Highest sugar was 409 (?) >>... 176 (x1 - sweets, sodas) >> 145 >> 120 >> 160.  Glucometer: AccuChek [employer]iva  Meals: fish  - fried - once a month when she eats out; no red meat.  Salads with ranch dressing. Sweet tea - daily, and sodas - once a week.  -No CKD, last BUN/creatinine: Lab Results  Component Value Date   BUN 10 09/13/2023   BUN 14 06/10/2023   CREATININE 0.59 09/13/2023   CREATININE 0.63 06/10/2023   Lab Results  Component Value Date   MICRALBCREAT 9 10/02/2023   MICRALBCREAT 20 05/18/2022  On olmesartan .  -+ HL; last set of lipids: Lab Results  Component Value Date   CHOL 149 10/02/2023   HDL 56 10/02/2023   LDLCALC 73 10/02/2023   LDLDIRECT 92 04/17/2022   TRIG 113 10/02/2023   CHOLHDL 2.7 10/02/2023  On Lipitor 80  mg daily.  Also, now on Zetia  10 mg daily.  - last eye exam was on 03/13/2024: Reportedly no DR. She has incipient stable cataract. Sees [facility].  -+ Occasional numbness and tingling in her feet.  She sees podiatry.  Previously on Neurontin  but ran out. I suggested alpha-lipoic acid and B complex in the past but she did not start.  Last foot exam 02/26/2024 by Dr. Malvin.  She was admitted with CP 05/2016 (found to have hypokalemia, mm cramps). On omeprazole  -this is helping her GERD. She was in the emergency room with abd. pain 04/04/2021.  She was found to have kidney stones.  She tested positive for celiac disease.  She sees GI.  ROS: + See HPI  I reviewed pt's medications, allergies, PMH, social hx, family hx, and changes were documented in the history of present illness. Otherwise, unchanged from my initial visit  note.  Past Medical History:  Diagnosis Date   Anxiety    Bipolar disorder (HCC)    Chest pain    a. 01/2003 MV w/ ? ant-inflat ischemia-->Cath: LAD 20p, otw nonobs dzs-->Med Rx; b. 2009 Cath: LAD 30, LCX nl/nondominant, RCA 30, nl EF; c. 08/2014 MV: no ischemia/infarct.   Complication of anesthesia    pt. states she is difficult to wake up   Depression    Diabetes mellitus    Type II   Diabetic neuropathy (HCC)    Diverticulitis    GERD (gastroesophageal reflux disease)    Headache    History of bronchitis    Hypercholesteremia    Hypertension    Hypokalemia    PONV (postoperative nausea and vomiting)    Shortness of breath dyspnea    Sleep apnea    Vertigo    patient reported   Past Surgical History:  Procedure Laterality Date   ABDOMINAL HYSTERECTOMY  1996   APPENDECTOMY  1990   CARDIAC CATHETERIZATION  2009   normal cath   CARDIAC CATHETERIZATION  2004   normal cath   CHOLECYSTECTOMY  1994   TONSILLECTOMY     TONSILLECTOMY/ADENOIDECTOMY/TURBINATE REDUCTION Bilateral 03/01/2015   Procedure: TONSILLECTOMY/TURBINATE REDUCTION;  Surgeon: Lonni FORBES Angle, MD;  Location: [facility] OR;  Service: ENT;  Laterality: Bilateral;   UVULOPALATOPHARYNGOPLASTY Bilateral 03/01/2015   Procedure: UVULOPALATOPHARYNGOPLASTY (UPPP);  Surgeon: Lonni FORBES Angle, MD;  Location: [facility] OR;  Service: ENT;  Laterality: Bilateral;   WISDOM TOOTH EXTRACTION     Social History   Social History   Marital status: Divorced    Spouse name: N/A   Number of children: 2   Occupational History   Disability    Social History Main Topics   Smoking status: Former Smoker    Quit date: 08/07/1982   Smokeless tobacco: Never Used   Alcohol use Yes     Comment: Drinks during the weekends 1 beer or 1 drink of liquor per day    Drug use: No   Sexual activity: Yes    Partners: Male    Birth control/ protection: Surgical   Current Outpatient Medications on File Prior to Visit  Medication Sig Dispense  Refill   ACCU-CHEK GUIDE test strip USE AS INSTRUCTED TO CHECK BLOOD SUGAR 2X DAILY 200 strip 3   aspirin  EC 81 MG tablet Take 81 mg by mouth daily.     atorvastatin  (LIPITOR) 80 MG tablet TAKE 1 TABLET BY MOUTH EVERY DAY 90 tablet 0   baclofen  (LIORESAL ) 10 MG tablet Take 1 tablet (10 mg total) by mouth 3 (three) times  daily as needed for muscle spasms. 60 tablet 0   benzonatate  (TESSALON ) 100 MG capsule Take 1 capsule (100 mg total) by mouth every 8 (eight) hours. 21 capsule 0   butalbital -acetaminophen -caffeine  (FIORICET) 50-325-40 MG tablet Take 1-2 tablets by mouth every 6 (six) hours as needed for headache. 20 tablet 0   Capsaicin  0.075 % STCK Apply 1 application  topically 4 (four) times daily. Apply to affected area on leg. 1 g 0   celecoxib  (CELEBREX ) 200 MG capsule TAKE 1 CAPSULE (200 MG TOTAL) BY MOUTH AT BEDTIME AS NEEDED FOR MILD PAIN (PAIN SCORE 1-3) OR MODERATE PAIN (PAIN SCORE 4-6). 90 capsule 0   dapagliflozin  propanediol (FARXIGA ) 5 MG TABS tablet Take 1 tablet (5 mg total) by mouth daily before breakfast. 90 tablet 3   ezetimibe  (ZETIA ) 10 MG tablet TAKE 1 TABLET BY MOUTH EVERY DAY 15 tablet 0   gabapentin  (NEURONTIN ) 300 MG capsule Take 1 capsule (300 mg total) by mouth 3 (three) times daily for 7 days. 21 capsule 0   gabapentin  (NEURONTIN ) 300 MG capsule TAKE 1 CAPSULE BY MOUTH TWICE A DAY 180 capsule 0   hydrOXYzine  (ATARAX ) 10 MG tablet Take 1 tablet (10 mg total) by mouth 3 (three) times daily as needed. 30 tablet 0   methylPREDNISolone  (MEDROL  DOSEPAK) 4 MG TBPK tablet Take as directed for 6 days 1 each 0   Olmesartan -amLODIPine -HCTZ 20-5-12.5 MG TABS TAKE 1 TABLET BY MOUTH EVERY DAY 90 tablet 1   omeprazole  (PRILOSEC) 40 MG capsule TAKE 1 CAPSULE BY MOUTH EVERY DAY 90 capsule 1   Semaglutide , 1 MG/DOSE, (OZEMPIC , 1 MG/DOSE,) 4 MG/3ML SOPN Inject 1 mg into the skin once a week. INJECT 1 MG INTO THE SKIN ONE TIME PER WEEK 9 mL 3   valACYclovir  (VALTREX ) 500 MG tablet TAKE 1  TABLET BY MOUTH 2 (TWO) TIMES DAILY. FOR 3 DAYS WHEN HAVING ACTIVE INFECTION ONLY. 180 tablet 0   Vitamin D , Ergocalciferol , (DRISDOL ) 1.25 MG (50000 UNIT) CAPS capsule Take 1 capsule (50,000 Units total) by mouth every 7 (seven) days. 12 capsule 0   No current facility-administered medications on file prior to visit.   Allergies  Allergen Reactions   Other Other (See Comments)    No Blood Products, personal preference   Hyoscyamine Hives and Rash   Latex Itching and Rash   Family History  Problem Relation Age of Onset   Diabetes Mother    Gout Mother    Hypertension Mother    Arthritis Mother    Heart disease Father    Heart failure Sister    Arthritis Sister    Heart disease Maternal Grandmother    Diabetes Maternal Grandmother    Heart disease Paternal Grandmother    Diabetes Paternal Grandmother    Diabetes Sister    Bursitis Sister    Crohn's disease Sister    PE: BP 120/86 (BP Location: Left Arm, Patient Position: Sitting, Cuff Size: Normal)   Pulse 83   Ht 5' 6 (1.676 m)   Wt 173 lb (78.5 kg)   SpO2 96%   BMI 27.92 kg/m  Wt Readings from Last 15 Encounters:  03/25/24 173 lb (78.5 kg)  03/13/24 173 lb (78.5 kg)  03/06/24 173 lb (78.5 kg)  01/01/24 171 lb (77.6 kg)  11/09/23 171 lb 9.6 oz (77.8 kg)  11/01/23 174 lb (78.9 kg)  10/30/23 174 lb (78.9 kg)  10/29/23 174 lb (78.9 kg)  10/02/23 172 lb (78 kg)  09/13/23 175 lb 0.7  oz (79.4 kg)  06/26/23 175 lb (79.4 kg)  06/11/23 177 lb 6.4 oz (80.5 kg)  06/10/23 174 lb (78.9 kg)  03/17/23 179 lb (81.2 kg)  03/12/23 179 lb 2 oz (81.3 kg)   Constitutional: overweight, in NAD Eyes:  EOMI, no exophthalmos ENT: no neck masses, no cervical lymphadenopathy Cardiovascular: RRR, No MRG, + L>R LE edema Respiratory: CTA B Musculoskeletal: no deformities Skin:no rashes Neurological: no tremor with outstretched hands  ASSESSMENT: 1. DM2, insulin -independent now, uncontrolled, with complications - CAD - PN  2.  HL  3.  Overweight  PLAN:  1. Patient with longstanding, uncontrolled, type 2 diabetes, with improved control after changing GLP-1 receptor agonist from Victoza  to Ozempic .  She is also on an SGLT2 inhibitor.  We were able to stop her sulfonylurea.  At last visit, HbA1c was improved to 6.5% and she had another HbA1c obtained 3 weeks ago and this was stable.  At last visit sugars were all at goal.  We did not change her regimen.  I refilled her prescriptions. - At today's visit, sugars remain at goal, without hyper or hypoglycemic exceptions.  She is only checking blood sugars at the beginning and the end of the day.  For now, we will continue this practice. - I suggested to:  Patient Instructions  Please continue: - Farxiga  5 mg daily before breakfast - Ozempic  1 mg weekly   Please return in 4-6 months with your sugar log.   - advised to check sugars at different times of the day - 1x a day, rotating check times - advised for yearly eye exams >> she is UTD - return to clinic in 4-6 months  2. HL - Latest lipid panel was reviewed from 09/2023: LDL improved but still above our target of less than 55 due to cardiovascular disease: Lab Results  Component Value Date   CHOL 149 10/02/2023   HDL 56 10/02/2023   LDLCALC 73 10/02/2023   LDLDIRECT 92 04/17/2022   TRIG 113 10/02/2023   CHOLHDL 2.7 10/02/2023  - She continues on Lipitor 80 mg daily and Zetia  10 mg daily (added more recently) with good tolerance.  3. Overweight - will continue the SGLT2 inhibitor and GLP-1 receptor agonist, which should also help with weight loss - Weight was approximately stable in the last 2 years. - At today's visit, weight is also approximately stable  Lela Fendt, MD PhD [facility] Endocrinology

## 2024-03-27 ENCOUNTER — Other Ambulatory Visit (HOSPITAL_BASED_OUTPATIENT_CLINIC_OR_DEPARTMENT_OTHER)

## 2024-03-27 ENCOUNTER — Other Ambulatory Visit: Payer: Self-pay | Admitting: Podiatry

## 2024-03-31 ENCOUNTER — Other Ambulatory Visit: Payer: Self-pay

## 2024-03-31 DIAGNOSIS — E785 Hyperlipidemia, unspecified: Secondary | ICD-10-CM

## 2024-03-31 MED ORDER — ATORVASTATIN CALCIUM 80 MG PO TABS: 80.0000 mg | ORAL_TABLET | Freq: Every day | ORAL | 3 refills | Status: AC

## 2024-04-03 ENCOUNTER — Ambulatory Visit: Admitting: Podiatry

## 2024-04-17 ENCOUNTER — Ambulatory Visit (INDEPENDENT_AMBULATORY_CARE_PROVIDER_SITE_OTHER)

## 2024-04-17 ENCOUNTER — Ambulatory Visit (INDEPENDENT_AMBULATORY_CARE_PROVIDER_SITE_OTHER): Admitting: Podiatry

## 2024-04-17 VITALS — Ht 66.0 in | Wt 173.0 lb

## 2024-04-17 DIAGNOSIS — G629 Polyneuropathy, unspecified: Secondary | ICD-10-CM

## 2024-04-17 DIAGNOSIS — M2012 Hallux valgus (acquired), left foot: Secondary | ICD-10-CM

## 2024-04-17 MED ORDER — LIDOCAINE 5 % EX PTCH: 1.0000 | MEDICATED_PATCH | CUTANEOUS | 0 refills | Status: AC

## 2024-04-17 NOTE — Progress Notes (Signed)
  Subjective:  Patient ID: Bonnie Gordon, female    DOB: 27-Aug-1955,  MRN: [id number]  Chief Complaint  Patient presents with   Post-op Follow-up    RM 3 Pt is hallux algus of left foot. Pt states some stabbing pain left toe with a tingling sensation at the tip of the toe. Pt states wearing compression sock for pain relief.    DOS: 10/12/2023 Procedure: Bunionectomy left foot  68 y.o. female returns for post-op check.   She returns for follow-up, she saw Dr. Malvin in July after a fall.  Mostly having pain on the inside of the big toe along the arch  Review of Systems: Negative except as noted in the HPI. Denies N/V/F/Ch.   Objective:  There were no vitals filed for this visit. Body mass index is 27.92 kg/m. Constitutional Well developed. Well nourished.  Vascular Foot warm and well perfused. Capillary refill normal to all digits.  Calf is soft and supple, no posterior calf or knee pain, negative Homans' sign  Neurologic Normal speech. Oriented to person, place, and time. Epicritic sensation to light touch grossly present bilaterally.  Dermatologic Incisions were healed.     Orthopedic: Good range of motion of the toe.  No pain at the previous bunion site.  She does have tenderness over each incision   Multiple view plain film radiographs: Her correction is maintained she has complete consolidation across her osteotomy site with visible bone callus present Assessment:   1. Hallux valgus of left foot   2. Peripheral neuritis    Plan:  Patient was evaluated and treated and all questions answered.  S/p foot surgery left - Having tenderness over the incisions.  I recommended corticosteroid injection along each of the distal scars which are the most painful for her.  Following consent and prep with alcohol the left foot along the hallux and medial scar was injected with 20 mg of Kenalog  and 5 mg of Marcaine.  She tolerated this well.  I also recommended physical therapy for  scar and tissue desensitization and referral was placed.  Lidoderm  patches sent to pharmacy to use as needed.  Follow-up in 6 weeks to reevaluate Return in about 6 weeks (around 05/29/2024) for f/u injection and pt.

## 2024-04-17 NOTE — Patient Instructions (Signed)
Call to schedule physical therapy: [facility] at [facility] [address]  [phone]  

## 2024-04-18 ENCOUNTER — Telehealth: Payer: Self-pay

## 2024-04-18 NOTE — Telephone Encounter (Signed)
 PA was approved 01/19/24-04/18/2025

## 2024-04-18 NOTE — Telephone Encounter (Signed)
 PA request received for Lidocaine  5% patches. PA submitted and waiting on response.  Bonnie Gordon  (Bonnie Gordon) PA Case ID #: [id number] Rx #: [id number]

## 2024-04-21 ENCOUNTER — Telehealth: Payer: Self-pay | Admitting: Family Medicine

## 2024-04-21 ENCOUNTER — Other Ambulatory Visit: Payer: Self-pay

## 2024-04-21 DIAGNOSIS — M79671 Pain in right foot: Secondary | ICD-10-CM

## 2024-04-22 NOTE — Telephone Encounter (Signed)
 Patient needs an appt for Celoxcib and Valtrex  refills. It can be with anyone who is available.

## 2024-04-25 ENCOUNTER — Other Ambulatory Visit: Payer: Self-pay

## 2024-04-25 DIAGNOSIS — M79671 Pain in right foot: Secondary | ICD-10-CM

## 2024-04-29 ENCOUNTER — Other Ambulatory Visit: Payer: Self-pay

## 2024-04-29 DIAGNOSIS — I10 Essential (primary) hypertension: Secondary | ICD-10-CM

## 2024-04-29 MED ORDER — OLMESARTAN-AMLODIPINE-HCTZ 20-5-12.5 MG PO TABS: 1.0000 | ORAL_TABLET | Freq: Every day | ORAL | 1 refills | Status: AC

## 2024-04-30 ENCOUNTER — Other Ambulatory Visit: Payer: Self-pay | Admitting: Podiatry

## 2024-04-30 ENCOUNTER — Encounter (HOSPITAL_COMMUNITY): Payer: Self-pay

## 2024-04-30 ENCOUNTER — Other Ambulatory Visit: Payer: Self-pay

## 2024-04-30 ENCOUNTER — Emergency Department (HOSPITAL_COMMUNITY)

## 2024-04-30 ENCOUNTER — Emergency Department (HOSPITAL_COMMUNITY)
Admission: EM | Admit: 2024-04-30 | Discharge: 2024-04-30 | Disposition: A | Discharge status: Home / Self Care | Attending: Emergency Medicine | Admitting: Emergency Medicine

## 2024-04-30 DIAGNOSIS — I10 Essential (primary) hypertension: Secondary | ICD-10-CM | POA: Diagnosis not present

## 2024-04-30 DIAGNOSIS — M436 Torticollis: Secondary | ICD-10-CM | POA: Insufficient documentation

## 2024-04-30 DIAGNOSIS — Z7982 Long term (current) use of aspirin: Secondary | ICD-10-CM | POA: Insufficient documentation

## 2024-04-30 DIAGNOSIS — Z7984 Long term (current) use of oral hypoglycemic drugs: Secondary | ICD-10-CM | POA: Insufficient documentation

## 2024-04-30 DIAGNOSIS — Z794 Long term (current) use of insulin: Secondary | ICD-10-CM | POA: Diagnosis not present

## 2024-04-30 DIAGNOSIS — E119 Type 2 diabetes mellitus without complications: Secondary | ICD-10-CM | POA: Insufficient documentation

## 2024-04-30 DIAGNOSIS — Z91040 Latex allergy status: Secondary | ICD-10-CM | POA: Diagnosis not present

## 2024-04-30 DIAGNOSIS — M4802 Spinal stenosis, cervical region: Secondary | ICD-10-CM | POA: Diagnosis not present

## 2024-04-30 DIAGNOSIS — Z79899 Other long term (current) drug therapy: Secondary | ICD-10-CM | POA: Diagnosis not present

## 2024-04-30 DIAGNOSIS — I2510 Atherosclerotic heart disease of native coronary artery without angina pectoris: Secondary | ICD-10-CM | POA: Insufficient documentation

## 2024-04-30 DIAGNOSIS — M542 Cervicalgia: Secondary | ICD-10-CM | POA: Diagnosis present

## 2024-04-30 MED ORDER — DIAZEPAM 5 MG PO TABS: 5.0000 mg | ORAL_TABLET | Freq: Two times a day (BID) | ORAL | 0 refills | Status: DC | PRN

## 2024-04-30 NOTE — ED Triage Notes (Signed)
 Pt states she has been dealing with neck pain that is worsening for 3 weeks. States she can't turn her head, has to turn her whole body. Denies known injury.

## 2024-04-30 NOTE — ED Provider Triage Note (Signed)
 Emergency Medicine Provider Triage Evaluation Note  Bonnie Gordon , a 68 y.o. female  was evaluated in triage.  Pt complains of right sided neck pain for 3 weeks radiates down right arm. No cancer or IVDU, no fever. No injury or fall. Notes severe pain.   Review of Systems  Positive: Neck pain Negative: fever  Physical Exam  BP 108/66 (BP Location: Right Arm)   Pulse 86   Temp 98.5 F (36.9 C)   Resp 17   Ht 5' 6 (1.676 m)   Wt 78.9 kg   SpO2 92%   BMI 28.08 kg/m  Gen:   Awake, no distress   Resp:  Normal effort  MSK:   Moves extremities without difficulty, moves neck through full ROM without difficulty.  Other:  Midline cervical ttp  Medical Decision Making  Medically screening exam initiated at 2:14 PM.  Appropriate orders placed.  Jinnifer Gottschall was informed that the remainder of the evaluation will be completed by another provider, this initial triage assessment does not replace that evaluation, and the importance of remaining in the ED until their evaluation is complete.     Shermon Warren SAILOR, PA-C 04/30/24 1415

## 2024-04-30 NOTE — ED Provider Notes (Signed)
 [facility] AT [facility] Provider Note   CSN: [id number] Arrival date & time: 04/30/24  1202     Patient presents with: Torticollis   Bonnie Gordon is a 68 y.o. female.   The history is provided by the patient and medical records. No language interpreter was used.     68 year old female with history of diabetes, CAD, hypertension, anxiety, bipolar presenting with complaint of neck pain.  Patient states for the past 3 weeks she has had neck stiffness and headache.  States she has to turn her body to see because turning her neck causing worsening pain.  She works as a [employer] and sometimes [employer] bother her.  Today her neck pain was more intense and she was having more trouble turning so her boss recommend patient to come to the ER to be evaluated.  At home patient has been taking multiple medication for her neck pain and headache including Tylenol , muscle relaxant, gabapentin , Celebrex  without adequate relief.  She mention she was told that she has some disc compression in her neck many years ago.  She denies any recent injury denies any fever, cold symptoms, chest pain shortness of breath.  She denies any arm weakness or numbness.  Prior to Admission medications   Medication Sig Start Date End Date Taking? Authorizing Provider  ACCU-CHEK GUIDE test strip USE AS INSTRUCTED TO CHECK BLOOD SUGAR 2X DAILY 06/07/22   Trixie File, MD  aspirin  EC 81 MG tablet Take 81 mg by mouth daily.    [provider]  atorvastatin  (LIPITOR) 80 MG tablet Take 1 tablet (80 mg total) by mouth daily. 03/31/24   Gomes, Adriana, DO  baclofen  (LIORESAL ) 10 MG tablet Take 1 tablet (10 mg total) by mouth 3 (three) times daily as needed for muscle spasms. 03/06/24   Rosendo Rush, MD  benzonatate  (TESSALON ) 100 MG capsule Take 1 capsule (100 mg total) by mouth every 8 (eight) hours. 03/15/24   Kammerer, Megan L, DO  butalbital -acetaminophen -caffeine  (FIORICET) 50-325-40  MG tablet Take 1-2 tablets by mouth every 6 (six) hours as needed for headache. 03/15/24 03/15/25  Kammerer, Megan L, DO  Capsaicin  0.075 % STCK Apply 1 application  topically 4 (four) times daily. Apply to affected area on leg. 06/26/23   Tharon Lung, MD  celecoxib  (CELEBREX ) 200 MG capsule TAKE 1 CAPSULE (200 MG TOTAL) BY MOUTH AT BEDTIME AS NEEDED FOR MILD PAIN (PAIN SCORE 1-3) OR MODERATE PAIN (PAIN SCORE 4-6). 01/22/24   Christia Budds, MD  dapagliflozin  propanediol (FARXIGA ) 5 MG TABS tablet Take 1 tablet (5 mg total) by mouth daily before breakfast. 11/01/23   Trixie File, MD  ezetimibe  (ZETIA ) 10 MG tablet TAKE 1 TABLET BY MOUTH EVERY DAY 01/24/24   Loistine Sober, NP  gabapentin  (NEURONTIN ) 300 MG capsule Take 1 capsule (300 mg total) by mouth 3 (three) times daily for 7 days. 10/12/23   McDonald, Juliene SAUNDERS, DPM  gabapentin  (NEURONTIN ) 300 MG capsule TAKE 1 CAPSULE BY MOUTH TWICE A DAY 01/16/24   McDonald, Adam R, DPM  hydrOXYzine  (ATARAX ) 10 MG tablet Take 1 tablet (10 mg total) by mouth 3 (three) times daily as needed. 06/12/23   Bryan Bianchi, MD  lidocaine  (LIDODERM ) 5 % Place 1 patch onto the skin daily. Remove & Discard patch within 12 hours or as directed by MD 04/17/24   Silva Juliene SAUNDERS, DPM  methylPREDNISolone  (MEDROL  DOSEPAK) 4 MG TBPK tablet Take as directed for 6 days 02/26/24   Malvin Blunt  F, DPM  Olmesartan -amLODIPine -HCTZ 20-5-12.5 MG TABS Take 1 tablet by mouth daily. 04/29/24   Janna Ferrier, DO  omeprazole  (PRILOSEC) 40 MG capsule TAKE 1 CAPSULE BY MOUTH EVERY DAY 01/16/24   Orlando Pond, DO  Semaglutide , 1 MG/DOSE, (OZEMPIC , 1 MG/DOSE,) 4 MG/3ML SOPN Inject 1 mg into the skin once a week. INJECT 1 MG INTO THE SKIN ONE TIME PER WEEK 11/01/23   Trixie File, MD  valACYclovir  (VALTREX ) 500 MG tablet TAKE 1 TABLET BY MOUTH 2 (TWO) TIMES DAILY. FOR 3 DAYS WHEN HAVING ACTIVE INFECTION ONLY. 01/22/24   Christia Budds, MD  Vitamin D , Ergocalciferol , (DRISDOL ) 1.25 MG  (50000 UNIT) CAPS capsule TAKE 1 CAPSULE (50,000 UNITS TOTAL) BY MOUTH EVERY 7 (SEVEN) DAYS 03/27/24   Silva Juliene SAUNDERS, DPM    Allergies: Other, Hyoscyamine, and Latex    Review of Systems  All other systems reviewed and are negative.   Updated Vital Signs BP 137/85   Pulse 71   Temp 98 F (36.7 C) (Oral)   Resp 18   Ht 5' 6 (1.676 m)   Wt 78.9 kg   SpO2 98%   BMI 28.08 kg/m   Physical Exam Vitals and nursing note reviewed.  Constitutional:      General: She is not in acute distress.    Appearance: She is well-developed.  HENT:     Head: Normocephalic and atraumatic.  Eyes:     Conjunctiva/sclera: Conjunctivae normal.  Neck:     Vascular: No carotid bruit.     Comments: Decreased lateral neck range of motion with positive Spurling sign Pulmonary:     Effort: Pulmonary effort is normal.  Musculoskeletal:     Cervical back: Rigidity and tenderness present.     Comments: 5 out of 5 strength to bilateral upper extremities with equal grip strength and equal radial pulse bilaterally.  Lymphadenopathy:     Cervical: No cervical adenopathy.  Skin:    Findings: No rash.  Neurological:     Mental Status: She is alert.  Psychiatric:        Mood and Affect: Mood normal.     (all labs ordered are listed, but only abnormal results are displayed) Labs Reviewed - No data to display  EKG: None  Radiology: CT Cervical Spine Wo Contrast Result Date: 04/30/2024 CLINICAL DATA:  Acute neck pain EXAM: CT CERVICAL SPINE WITHOUT CONTRAST TECHNIQUE: Multidetector CT imaging of the cervical spine was performed without intravenous contrast. Multiplanar CT image reconstructions were also generated. RADIATION DOSE REDUCTION: This exam was performed according to the departmental dose-optimization program which includes automated exposure control, adjustment of the mA and/or kV according to patient size and/or use of iterative reconstruction technique. COMPARISON:  None Available. FINDINGS:  Alignment: Mild reversal of the normal cervical lordosis. Skull base and vertebrae: Small Schmorl's node along the inferior endplate of C3. Similar small Schmorl's nodes along the inferior endplate of C6. Nitrogen gas phenomenon posteriorly in the C5 vertebral body likely related to a Schmorl's node from the inferior endplate. No fracture or acute bony findings identified. Anterior interbody spurring at all levels between C3 and C7. Fused facet joints at C2-3 bilaterally. Soft tissues and spinal canal: Unremarkable Disc levels: C2-3: Unremarkable C3-4: Borderline bilateral foraminal stenosis due to disc bulge and facet spurring. C4-5: No impingement.  Disc bulge noted. C5-6: Moderate right foraminal stenosis due to uncinate and facet spurring. Mild disc bulge. C6-7: Borderline left foraminal stenosis due to uncinate spurring. C7-T1: No impingement.  Mild degenerative left  facet arthropathy. Upper chest: Unremarkable Other: No supplemental non-categorized findings. IMPRESSION: 1. Cervical spondylosis and degenerative disc disease, causing moderate impingement at C5-6 and borderline impingement at C3-4 and C6-7. 2. Mild reversal of the normal cervical lordosis. 3. No acute bony findings. Electronically Signed   By: Ryan Salvage M.D.   On: 04/30/2024 14:53     Procedures   Medications Ordered in the ED - No data to display                                  Medical Decision Making  BP 137/85   Pulse 71   Temp 98 F (36.7 C) (Oral)   Resp 18   Ht 5' 6 (1.676 m)   Wt 78.9 kg   SpO2 98%   BMI 28.08 kg/m   36:24 PM  68 year old female with history of diabetes, CAD, hypertension, anxiety, bipolar presenting with complaint of neck pain.  Patient states for the past 3 weeks she has had neck stiffness and headache.  States she has to turn her body to see because turning her neck causing worsening pain.  She works as a [employer] and sometimes [employer] bother her.  Today her neck pain was more  intense and she was having more trouble turning so her boss recommend patient to come to the ER to be evaluated.  At home patient has been taking multiple medication for her neck pain and headache including Tylenol , muscle relaxant, gabapentin , Celebrex  without adequate relief.  She mention she was told that she has some disc compression in her neck many years ago.  She denies any recent injury denies any fever, cold symptoms, chest pain shortness of breath.  She denies any arm weakness or numbness.  Exam notable for positive Spurling sign with axial loading of her neck.  She does not have any focal numbness or focal weakness.  Her upper arm strength are equal bilaterally.  She does not have any nuchal rigidity to suggest meningitis she has decreased neck range of motion.  Vital signs overall reassuring.  Her symptom is acute on chronic in nature and CT scan of the cervical spine obtained independently viewed inter by me did shows significant degenerative disc disease causing moderate impingement of multiple disc level.  This is consistent with patient presentation.  Strong encouraged patient to follow-up with neurosurgery for further evaluation and management of her condition.  She is already on multiple medication that I normally would prescribe which including anti-inflammatory medication and muscle relaxant.  Will prescribe a short course of Valium  for torticollis but strongly encourage patient to avoid operating heavy machinery while taking Valium  and to avoid taking it with any other central suppressant medication.  DDx: Strain, sprain, fracture, dislocation, torticollis, spinal stenosis, meningitis, stroke     Final diagnoses:  Torticollis, acute  Spinal stenosis of cervical region    ED Discharge Orders          Ordered    diazepam  (VALIUM ) 5 MG tablet  Every 12 hours PRN        04/30/24 2053               Nivia Colon, PA-C 04/30/24 2056    Emil Share, DO 04/30/24 2058

## 2024-04-30 NOTE — Discharge Instructions (Addendum)
 Your neck pain and stiffness is likely due to spinal stenosis.  Continue with your current medication however if your symptoms persist you may use Valium  as needed but be aware that this can cause extreme drowsiness so do not operate any heavy machinery while taking this medication.  Call and follow-up closely with the neurosurgeon for outpatient management of your condition.  If you develop arm numbness arm weakness with worsening neck pain do not hesitate to return to the ER

## 2024-04-30 NOTE — ED Triage Notes (Signed)
 Patient states she has had neck pain x 3 weeks, steadily getting worse, thought it was her sleeping position/pillow, but no relief with changing those. 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 Patient reports 10/10 pain after 1000mg  tylenol  this morning.

## 2024-05-05 ENCOUNTER — Ambulatory Visit (INDEPENDENT_AMBULATORY_CARE_PROVIDER_SITE_OTHER): Admitting: Student

## 2024-05-05 VITALS — BP 124/81 | HR 79 | Wt 174.2 lb

## 2024-05-05 DIAGNOSIS — M542 Cervicalgia: Secondary | ICD-10-CM | POA: Diagnosis not present

## 2024-05-05 DIAGNOSIS — Z23 Encounter for immunization: Secondary | ICD-10-CM | POA: Diagnosis not present

## 2024-05-05 DIAGNOSIS — G8929 Other chronic pain: Secondary | ICD-10-CM

## 2024-05-05 NOTE — Progress Notes (Signed)
    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  Sometimes have to move her entire body because she cannot turn her head due to neck pain Neck pain has been chronic for years and has gotten worse in the last few months Prompting recent ED visit where cervical CT was obtained CT on 04/26/2024 showed spondylolysis with disc degeneration and multilevel cervical impingement She has tried celecoxib , multiple muscle relaxers including baclofen  and Flexeril , gabapentin  Nothing other than celecoxib  provided relief although not anymore Of note patient said in 1999/2000 surgery was recommended but at the time declined because she was nervous   PERTINENT  PMH / PSH: Reviewed   OBJECTIVE:   BP 124/81   Pulse 79   Wt 174 lb 3.2 oz (79 kg)   SpO2 100%   BMI 28.12 kg/m    Physical Exam General: Alert, well appearing, NAD Cardiovascular: RRR, No Murmurs, Normal S2/S2 Respiratory: CTAB, No wheezing or Rales MSK: Diffused mild tenderness over the neck area R>L, Limited ranger of motion, no notable deformities or lesions.   ASSESSMENT/PLAN:   Neck pain. Chronic and intermittently worse. Suspect Cervical Radiculopathy. Given CT findings will send referral to neurosurgery for further evaluation and management. Recommend PRN tylenol  and muscle relaxer until she see neurosurgery. Return precautions discussed.   Health maintenance Flu vaccine administered today.   Norleen April, MD [facility]

## 2024-05-05 NOTE — Patient Instructions (Addendum)
 Pleasure to see you  Suspect you pain is due to cervical radiculopathy.  Your recent neck CT done this year multilevel cervical impingement.  I will be sending a referral to a neurosurgeon.  Please look out for a call from them to schedule an appointment.  For now continue to do Tylenol  for pain and a muscle relaxer.

## 2024-05-11 NOTE — Progress Notes (Deleted)
    SUBJECTIVE:   CHIEF COMPLAINT / HPI:   Refills - Celecoxib  and Valtrex   Dexa scan ***   PERTINENT  PMH / PSH: Anxiety, Bipolar Disorder, HTN, T2DM, HLD, CAD, OSA  OBJECTIVE:   There were no vitals taken for this visit.  General: Awake and Alert in NAD HEENT: NCAT. Sclera anicteric. No rhinorrhea. Cardiovascular: RRR. No M/R/G Respiratory: CTAB, normal WOB on RA. No wheezing, crackles, rhonchi, or diminished breath sounds. Abdomen: Soft, non-tender, non-distended. Bowel sounds normoactive/hypoactive/hyperactive. *** Extremities: Able to move all extremities. No BLE edema, no deformities or significant joint findings. Skin: Warm and dry. No abrasions or rashes noted. Neuro: A&Ox***. No focal neurological deficits.  ASSESSMENT/PLAN:   Assessment & Plan      Kathrine Melena, DO [facility]

## 2024-05-12 ENCOUNTER — Ambulatory Visit: Admitting: Family Medicine

## 2024-05-29 ENCOUNTER — Ambulatory Visit: Admitting: Podiatry

## 2024-05-29 VITALS — Ht 66.0 in | Wt 174.2 lb

## 2024-05-29 DIAGNOSIS — M7989 Other specified soft tissue disorders: Secondary | ICD-10-CM

## 2024-05-29 DIAGNOSIS — G629 Polyneuropathy, unspecified: Secondary | ICD-10-CM

## 2024-05-29 NOTE — Progress Notes (Signed)
  Subjective:  Patient ID: Bonnie Gordon, female    DOB: Mar 10, 1956,  MRN: [id number]  Chief Complaint  Patient presents with   Injections    Rm 7 Return in about 6 weeks for f/u injection and pt.     DOS: 10/12/2023 Procedure: Bunionectomy left foot  68 y.o. female returns for follow-up from injection.  She notes a mass developing on the top of the big toe that is hard and feels like a BB  Review of Systems: Negative except as noted in the HPI. Denies N/V/F/Ch.   Objective:  There were no vitals filed for this visit. Body mass index is 28.12 kg/m. Constitutional Well developed. Well nourished.  Vascular Foot warm and well perfused. Capillary refill normal to all digits.  Calf is soft and supple, no posterior calf or knee pain, negative Homans' sign  Neurologic Normal speech. Oriented to person, place, and time. Epicritic sensation to light touch grossly present bilaterally.  Dermatologic Incisions are well healed.     Orthopedic: Good range of motion of toe.  No pain at the scar sites from previous injection.  There is a small firm palpable mobile nodular mass in the soft tissues of the dorsal hallux   Multiple view plain film radiographs: Her correction is maintained she has complete consolidation across her osteotomy site with visible bone callus present1  Limited musculoskeletal ultrasound examination today of the left hallux revealed a small 5 mm hypoechogenic mass in the subcutaneous tissues that is separate from the joint does not appear to be bony and is separate from the extensor tendon complex Assessment:   1. Peripheral neuritis   2. Mass of soft tissue of foot    Plan:  Patient was evaluated and treated and all questions answered.  S/p foot surgery left - Improved greatly after the injection.  We discussed she has a new small soft tissue mass visible on ultrasound exam today that may be fatty lipid necrosis which could be secondary to the injection.  May  resolve uneventfully and is asymptomatic at this point.  I discussed with her that excision could be completed become symptomatic.  She will follow-up me in 3 months to reevaluate or sooner if she has further issues. Return in about 3 months (around 08/29/2024) for left bunion sx f/u (new xrays), mass on toe f/u.

## 2024-05-30 NOTE — Progress Notes (Unsigned)
 Referring Physician:  Anders Otto DASEN, MD [address] [place],  [place] [address]  Primary Physician:  Janna Ferrier, DO  History of Present Illness: 06/05/2024 [name] has a history of CAD, HTN, RBBB, OSA, DM, meralgia paresthetica, hyperlipidemia.   History of chronic neck pain x years. Seen in ED On 04/30/24 for neck pain.   She has constant neck pain that radiates into both shoulders with frequent headaches. She has constant right arm pain with numbness in her hand. Neck pain > right arm pain. She has a lot of stiffness in the morning. No weakness. Pain is worse with moving around and better with resting. No left arm pain. She has history of right shoulder rotator cuff repair.   No relief with celebrex , muscle relaxers (baclofen , flexeril ), neurontin .   Tobacco use: Does not smoke.   Bowel/Bladder Dysfunction: none  Conservative measures:  Physical therapy: did PT for her neck years ago Multimodal medical therapy including regular antiinflammatories:  celebrex , baclofen , flexeril , gabapentin , tylenol  Injections:  no epidural steroid injections  Past Surgery: no spinal surgeries  Bonnie Gordon has no symptoms of cervical myelopathy.  The symptoms are causing a significant impact on the patient's life.   Review of Systems:  A 10 point review of systems is negative, except for the pertinent positives and negatives detailed in the HPI.  Past Medical History: Past Medical History:  Diagnosis Date   Anxiety    Bipolar disorder (HCC)    Chest pain    a. 01/2003 MV w/ ? ant-inflat ischemia-->Cath: LAD 20p, otw nonobs dzs-->Med Rx; b. 2009 Cath: LAD 30, LCX nl/nondominant, RCA 30, nl EF; c. 08/2014 MV: no ischemia/infarct.   Complication of anesthesia    pt. states she is difficult to wake up   Depression    Diabetes mellitus    Type II   Diabetic neuropathy (HCC)    Diverticulitis    GERD (gastroesophageal reflux disease)    Headache     History of bronchitis    Hypercholesteremia    Hypertension    Hypokalemia    PONV (postoperative nausea and vomiting)    Shortness of breath dyspnea    Sleep apnea    Vertigo    patient reported    Past Surgical History: Past Surgical History:  Procedure Laterality Date   ABDOMINAL HYSTERECTOMY  1996   APPENDECTOMY  1990   CARDIAC CATHETERIZATION  2009   normal cath   CARDIAC CATHETERIZATION  2004   normal cath   CHOLECYSTECTOMY  1994   TONSILLECTOMY     TONSILLECTOMY/ADENOIDECTOMY/TURBINATE REDUCTION Bilateral 03/01/2015   Procedure: TONSILLECTOMY/TURBINATE REDUCTION;  Surgeon: Lonni FORBES Angle, MD;  Location: [facility] OR;  Service: ENT;  Laterality: Bilateral;   UVULOPALATOPHARYNGOPLASTY Bilateral 03/01/2015   Procedure: UVULOPALATOPHARYNGOPLASTY (UPPP);  Surgeon: Lonni FORBES Angle, MD;  Location: [facility] OR;  Service: ENT;  Laterality: Bilateral;   WISDOM TOOTH EXTRACTION      Allergies: Allergies as of 06/05/2024 - Review Complete 05/29/2024  Allergen Reaction Noted   Other  03/01/2015   Hyoscyamine Hives and Rash 06/10/2023   Latex Itching and Rash 10/04/2011    Medications: Outpatient Encounter Medications as of 06/05/2024  Medication Sig   ACCU-CHEK GUIDE test strip USE AS INSTRUCTED TO CHECK BLOOD SUGAR 2X DAILY   aspirin  EC 81 MG tablet Take 81 mg by mouth daily.   atorvastatin  (LIPITOR) 80 MG tablet Take 1 tablet (80 mg total) by mouth daily.   baclofen  (LIORESAL ) 10 MG tablet Take  1 tablet (10 mg total) by mouth 3 (three) times daily as needed for muscle spasms. (Patient not taking: Reported on 05/29/2024)   benzonatate  (TESSALON ) 100 MG capsule Take 1 capsule (100 mg total) by mouth every 8 (eight) hours. (Patient not taking: Reported on 05/29/2024)   butalbital -acetaminophen -caffeine  (FIORICET) 50-325-40 MG tablet Take 1-2 tablets by mouth every 6 (six) hours as needed for headache.   Capsaicin  0.075 % STCK Apply 1 application  topically 4 (four) times daily.  Apply to affected area on leg. (Patient not taking: Reported on 05/29/2024)   celecoxib  (CELEBREX ) 200 MG capsule TAKE 1 CAPSULE (200 MG TOTAL) BY MOUTH AT BEDTIME AS NEEDED FOR MILD PAIN (PAIN SCORE 1-3) OR MODERATE PAIN (PAIN SCORE 4-6).   dapagliflozin  propanediol (FARXIGA ) 5 MG TABS tablet Take 1 tablet (5 mg total) by mouth daily before breakfast.   diazepam  (VALIUM ) 5 MG tablet Take 1 tablet (5 mg total) by mouth every 12 (twelve) hours as needed for muscle spasms.   ezetimibe  (ZETIA ) 10 MG tablet TAKE 1 TABLET BY MOUTH EVERY DAY (Patient not taking: Reported on 05/29/2024)   gabapentin  (NEURONTIN ) 300 MG capsule Take 1 capsule (300 mg total) by mouth 3 (three) times daily for 7 days. (Patient not taking: Reported on 05/29/2024)   gabapentin  (NEURONTIN ) 300 MG capsule TAKE 1 CAPSULE BY MOUTH TWICE A DAY   hydrOXYzine  (ATARAX ) 10 MG tablet Take 1 tablet (10 mg total) by mouth 3 (three) times daily as needed. (Patient not taking: Reported on 05/29/2024)   lidocaine  (LIDODERM ) 5 % Place 1 patch onto the skin daily. Remove & Discard patch within 12 hours or as directed by MD (Patient not taking: Reported on 05/29/2024)   methylPREDNISolone  (MEDROL  DOSEPAK) 4 MG TBPK tablet Take as directed for 6 days (Patient not taking: Reported on 05/29/2024)   Olmesartan -amLODIPine -HCTZ 20-5-12.5 MG TABS Take 1 tablet by mouth daily.   omeprazole  (PRILOSEC) 40 MG capsule TAKE 1 CAPSULE BY MOUTH EVERY DAY (Patient not taking: Reported on 05/29/2024)   Semaglutide , 1 MG/DOSE, (OZEMPIC , 1 MG/DOSE,) 4 MG/3ML SOPN Inject 1 mg into the skin once a week. INJECT 1 MG INTO THE SKIN ONE TIME PER WEEK   valACYclovir  (VALTREX ) 500 MG tablet TAKE 1 TABLET BY MOUTH 2 (TWO) TIMES DAILY. FOR 3 DAYS WHEN HAVING ACTIVE INFECTION ONLY.   Vitamin D , Ergocalciferol , (DRISDOL ) 1.25 MG (50000 UNIT) CAPS capsule TAKE 1 CAPSULE (50,000 UNITS TOTAL) BY MOUTH EVERY 7 (SEVEN) DAYS (Patient not taking: Reported on 05/29/2024)   No  facility-administered encounter medications on file as of 06/05/2024.    Social History: Social History   Tobacco Use   Smoking status: Former    Current packs/day: 0.00    Types: Cigarettes    Quit date: 08/07/1982    Years since quitting: 41.8    Passive exposure: Past   Smokeless tobacco: Never  Vaping Use   Vaping status: Never Used  Substance Use Topics   Alcohol use: Not Currently    Comment: occasional / social   Drug use: No    Family Medical History: Family History  Problem Relation Age of Onset   Diabetes Mother    Gout Mother    Hypertension Mother    Arthritis Mother    Heart disease Father    Heart failure Sister    Arthritis Sister    Heart disease Maternal Grandmother    Diabetes Maternal Grandmother    Heart disease Paternal Grandmother    Diabetes Paternal Grandmother  Diabetes Sister    Bursitis Sister    Crohn's disease Sister     Physical Examination: Vitals:   06/05/24 1002  BP: 130/86    General: Patient is well developed, well nourished, calm, collected, and in no apparent distress. Attention to examination is appropriate.  Respiratory: Patient is breathing without any difficulty.   NEUROLOGICAL:     Awake, alert, oriented to person, place, and time.  Speech is clear and fluent. Fund of knowledge is appropriate.   Cranial Nerves: Pupils equal round and reactive to light.  Facial tone is symmetric.    Mild posterior cervical tenderness. No tenderness in bilateral trapezial region.   She has good ROM of both shoulders with minimal pain.   No abnormal lesions on exposed skin.   Strength: Side Biceps Triceps Deltoid Interossei Grip Wrist Ext. Wrist Flex.  R 5 5 5 5 5 5 5   L 5 5 5 5 5 5 5    Side Iliopsoas Quads Hamstring PF DF EHL  R 5 5 5 5 5 5   L 5 5 5 5 5 5    Reflexes are 2+ and symmetric at the biceps, brachioradialis, patella and achilles.   Hoffman's is absent.  Clonus is not present.   Bilateral upper and lower  extremity sensation is intact to light touch.     No pain with IR/ER of both hips.   Gait is normal.    Medical Decision Making  Imaging: Cervical CT scan dated 04/30/24:  FINDINGS: Alignment: Mild reversal of the normal cervical lordosis.   Skull base and vertebrae: Small Schmorl's node along the inferior endplate of C3. Similar small Schmorl's nodes along the inferior endplate of C6. Nitrogen gas phenomenon posteriorly in the C5 vertebral body likely related to a Schmorl's node from the inferior endplate.   No fracture or acute bony findings identified.   Anterior interbody spurring at all levels between C3 and C7. Fused facet joints at C2-3 bilaterally.   Soft tissues and spinal canal: Unremarkable   Disc levels:   C2-3: Unremarkable   C3-4: Borderline bilateral foraminal stenosis due to disc bulge and facet spurring.   C4-5: No impingement.  Disc bulge noted.   C5-6: Moderate right foraminal stenosis due to uncinate and facet spurring. Mild disc bulge.   C6-7: Borderline left foraminal stenosis due to uncinate spurring.   C7-T1: No impingement.  Mild degenerative left facet arthropathy.   Upper chest: Unremarkable   Other: No supplemental non-categorized findings.   IMPRESSION: 1. Cervical spondylosis and degenerative disc disease, causing moderate impingement at C5-6 and borderline impingement at C3-4 and C6-7. 2. Mild reversal of the normal cervical lordosis. 3. No acute bony findings.     Electronically Signed   By: Ryan Salvage M.D.   On: 04/30/2024 14:53    I have personally reviewed the images and agree with the above interpretation.  Assessment and Plan: Ms. Leveille has a history of chronic neck pain x years. Seen in ED On 04/30/24 for neck pain.   She has constant neck pain that radiates into both shoulders with frequent headaches. She has constant right arm pain with numbness in her hand. Neck pain > right arm pain. She has a lot of  stiffness in the morning. No left arm pain.   She has known cervical spondylosis and DDD. She has minimal bilateral foraminal stenosis C3-C4 and moderate right C5-C6 foraminal stenosis.   Treatment options discussed with patient and following plan made:   - PT for  cervical spine. Orders to [facility] in [place].  - Discussed cervical injections. She is not interested in them currently.  - If no improvement with PT, consider cervical MRI.  - Follow up with me in 6-8 weeks and prn.   I spent a total of 35 minutes in face-to-face and non-face-to-face activities related to this patient's care today including review of outside records, review of imaging, review of symptoms, physical exam, discussion of differential diagnosis, discussion of treatment options, and documentation.   Thank you for involving me in the care of this patient.   Glade Boys PA-C Dept. of Neurosurgery

## 2024-06-05 ENCOUNTER — Ambulatory Visit: Admitting: Orthopedic Surgery

## 2024-06-05 ENCOUNTER — Encounter: Payer: Self-pay | Admitting: Orthopedic Surgery

## 2024-06-05 VITALS — BP 130/86 | Wt 175.8 lb

## 2024-06-05 DIAGNOSIS — M5030 Other cervical disc degeneration, unspecified cervical region: Secondary | ICD-10-CM

## 2024-06-05 DIAGNOSIS — M4802 Spinal stenosis, cervical region: Secondary | ICD-10-CM | POA: Diagnosis not present

## 2024-06-05 DIAGNOSIS — M47812 Spondylosis without myelopathy or radiculopathy, cervical region: Secondary | ICD-10-CM | POA: Diagnosis not present

## 2024-06-05 DIAGNOSIS — M5412 Radiculopathy, cervical region: Secondary | ICD-10-CM

## 2024-06-05 NOTE — Patient Instructions (Signed)
 It was so nice to see you today. Thank you so much for coming in.    You have some wear and tear in your neck and this is likely causing your neck pain and may be contributing to your headaches. Right arm pain may be from irritation of nerve on right at C5-C6.   I sent physical therapy orders to [facility]. You can call them at [phone] if you don't hear from them to schedule your visit. They are at [address]   I will see you back in 6-8 weeks. Please do not hesitate to call if you have any questions or concerns. You can also message me in MyChart.   Glade Boys PA-C [phone]     The physicians and staff at [facility] at [facility] are committed to providing excellent care. You may receive a survey asking for feedback about your experience at our office. We value you your feedback and appreciate you taking the time to to fill it out. The [facility] leadership team is also available to discuss your experience in person, feel free to contact us  [phone].

## 2024-06-11 ENCOUNTER — Telehealth: Payer: Self-pay | Admitting: Internal Medicine

## 2024-06-11 NOTE — Telephone Encounter (Signed)
 Patient called to advise that insurance will be calling to confirm information about DM and devices

## 2024-07-02 ENCOUNTER — Other Ambulatory Visit: Payer: Self-pay | Admitting: Orthopedic Surgery

## 2024-07-02 DIAGNOSIS — M5412 Radiculopathy, cervical region: Secondary | ICD-10-CM

## 2024-07-07 ENCOUNTER — Other Ambulatory Visit

## 2024-07-09 ENCOUNTER — Other Ambulatory Visit

## 2024-07-09 ENCOUNTER — Other Ambulatory Visit: Payer: Self-pay

## 2024-07-09 MED ORDER — OMEPRAZOLE 20 MG PO CPDR: 20.0000 mg | DELAYED_RELEASE_CAPSULE | Freq: Every day | ORAL | 0 refills | Status: DC

## 2024-07-14 NOTE — Progress Notes (Deleted)
 "  Referring Physician:  Janna Ferrier, DO [address] [place],  [place] [address]  Primary Physician:  Janna Ferrier, DO  History of Present Illness: Bonnie Gordon has a history of CAD, HTN, RBBB, OSA, DM, meralgia paresthetica, hyperlipidemia.   History of chronic neck pain x years.   Last seen by me on 06/05/24 for constant neck pain that radiates into both shoulders and into right arm. She has known cervical spondylosis and DDD. She has minimal bilateral foraminal stenosis C3-C4 and moderate right C5-C6 foraminal stenosis.   Cervical injections discussed and she declined. She was sent to PT at [facility].  Did she do PT?***    She is here for follow up.      She has constant neck pain that radiates into both shoulders with frequent headaches. She has constant right arm pain with numbness in her hand. Neck pain > right arm pain. She has a lot of stiffness in the morning. No weakness. Pain is worse with moving around and better with resting. No left arm pain. She has history of right shoulder rotator cuff repair.   No relief with celebrex , muscle relaxers (baclofen , flexeril ), neurontin .   Tobacco use: Does not smoke.   Bowel/Bladder Dysfunction: none  Conservative measures:  Physical therapy: did PT for her neck years ago Multimodal medical therapy including regular antiinflammatories:  celebrex , baclofen , flexeril , gabapentin , tylenol  Injections:  no epidural steroid injections  Past Surgery: no spinal surgeries  Bonnie Gordon has no symptoms of cervical myelopathy.  The symptoms are causing a significant impact on the patient's life.   Review of Systems:  A 10 point review of systems is negative, except for the pertinent positives and negatives detailed in the HPI.  Past Medical History: Past Medical History:  Diagnosis Date   Anxiety    Bipolar disorder (HCC)    Chest pain    a. 01/2003 MV w/ ? ant-inflat ischemia-->Cath: LAD 20p, otw  nonobs dzs-->Med Rx; b. 2009 Cath: LAD 30, LCX nl/nondominant, RCA 30, nl EF; c. 08/2014 MV: no ischemia/infarct.   Complication of anesthesia    pt. states she is difficult to wake up   Depression    Diabetes mellitus    Type II   Diabetic neuropathy (HCC)    Diverticulitis    GERD (gastroesophageal reflux disease)    Headache    History of bronchitis    Hypercholesteremia    Hypertension    Hypokalemia    PONV (postoperative nausea and vomiting)    Shortness of breath dyspnea    Sleep apnea    Vertigo    patient reported    Past Surgical History: Past Surgical History:  Procedure Laterality Date   ABDOMINAL HYSTERECTOMY  1996   APPENDECTOMY  1990   CARDIAC CATHETERIZATION  2009   normal cath   CARDIAC CATHETERIZATION  2004   normal cath   CHOLECYSTECTOMY  1994   TONSILLECTOMY     TONSILLECTOMY/ADENOIDECTOMY/TURBINATE REDUCTION Bilateral 03/01/2015   Procedure: TONSILLECTOMY/TURBINATE REDUCTION;  Surgeon: Lonni FORBES Angle, MD;  [facility] OR;  Service: ENT;  Laterality: Bilateral;   UVULOPALATOPHARYNGOPLASTY Bilateral 03/01/2015   Procedure: UVULOPALATOPHARYNGOPLASTY (UPPP);  Surgeon: Lonni FORBES Angle, MD;  Location: [facility] OR;  Service: ENT;  Laterality: Bilateral;   WISDOM TOOTH EXTRACTION      Allergies: Allergies as of 07/21/2024 - Review Complete 06/05/2024  Allergen Reaction Noted   Other  03/01/2015   Hyoscyamine Hives and Rash 06/10/2023   Latex Itching and Rash 10/04/2011  Medications: Outpatient Encounter Medications as of 07/21/2024  Medication Sig   ACCU-CHEK GUIDE test strip USE AS INSTRUCTED TO CHECK BLOOD SUGAR 2X DAILY   aspirin  EC 81 MG tablet Take 81 mg by mouth daily.   atorvastatin  (LIPITOR) 80 MG tablet Take 1 tablet (80 mg total) by mouth daily.   celecoxib  (CELEBREX ) 200 MG capsule TAKE 1 CAPSULE (200 MG TOTAL) BY MOUTH AT BEDTIME AS NEEDED FOR MILD PAIN (PAIN SCORE 1-3) OR MODERATE PAIN (PAIN SCORE 4-6).   cyclobenzaprine   (FLEXERIL ) 10 MG tablet Cyclobenzaprine  Hydrochloride 10 mg oral tablet   dapagliflozin  propanediol (FARXIGA ) 5 MG TABS tablet Take 1 tablet (5 mg total) by mouth daily before breakfast.   diclofenac  (VOLTAREN ) 75 MG EC tablet Diclofenac  Sodium sodium 75 mg oral delayed release tablet   glimepiride  (AMARYL ) 4 MG tablet GLIMEPIRIDE   TAB 4MG    lidocaine  (LIDODERM ) 5 % Place 1 patch onto the skin daily. Remove & Discard patch within 12 hours or as directed by MD   meloxicam (MOBIC) 7.5 MG tablet Oral   Olmesartan -amLODIPine -HCTZ 20-5-12.5 MG TABS Take 1 tablet by mouth daily.   omeprazole  (PRILOSEC) 20 MG capsule Take 1 capsule (20 mg total) by mouth daily.   potassium bicarbonate (K-LYTE) 25 MEQ disintegrating tablet Klor-Con /25 oral powder for reconstitution   Semaglutide , 1 MG/DOSE, (OZEMPIC , 1 MG/DOSE,) 4 MG/3ML SOPN Inject 1 mg into the skin once a week. INJECT 1 MG INTO THE SKIN ONE TIME PER WEEK   Terbinafine HCl (KP TERBINAFINE HYDROCHLORIDE EX) Terbinafine Hydrochloride 250 mg oral tablet   valACYclovir  (VALTREX ) 500 MG tablet TAKE 1 TABLET BY MOUTH 2 (TWO) TIMES DAILY. FOR 3 DAYS WHEN HAVING ACTIVE INFECTION ONLY.   No facility-administered encounter medications on file as of 07/21/2024.    Social History: Social History   Tobacco Use   Smoking status: Former    Current packs/day: 0.00    Types: Cigarettes    Quit date: 08/07/1982    Years since quitting: 41.9    Passive exposure: Past   Smokeless tobacco: Never  Vaping Use   Vaping status: Never Used  Substance Use Topics   Alcohol use: Not Currently    Comment: occasional / social   Drug use: No    Family Medical History: Family History  Problem Relation Age of Onset   Diabetes Mother    Gout Mother    Hypertension Mother    Arthritis Mother    Heart disease Father    Heart failure Sister    Arthritis Sister    Heart disease Maternal Grandmother    Diabetes Maternal Grandmother    Heart disease Paternal  Grandmother    Diabetes Paternal Grandmother    Diabetes Sister    Bursitis Sister    Crohn's disease Sister     Physical Examination: There were no vitals filed for this visit.  Awake, alert, oriented to person, place, and time.  Speech is clear and fluent. Fund of knowledge is appropriate.   Cranial Nerves: Pupils equal round and reactive to light.  Facial tone is symmetric.    Mild posterior cervical tenderness. No tenderness in bilateral trapezial region.   She has good ROM of both shoulders with minimal pain.   No abnormal lesions on exposed skin.   Strength: Side Biceps Triceps Deltoid Interossei Grip Wrist Ext. Wrist Flex.  R 5 5 5 5 5 5 5   L 5 5 5 5 5 5 5    Reflexes are 2+ and symmetric at the biceps,  brachioradialis.   Hoffman's is absent.  Bilateral upper extremity sensation is intact to light touch.     Gait is normal.    Medical Decision Making  Imaging: none   Assessment and Plan: Ms. Wellman has a history of chronic neck pain x years. Seen in ED On 04/30/24 for neck pain.   She has constant neck pain that radiates into both shoulders with frequent headaches. She has constant right arm pain with numbness in her hand. Neck pain > right arm pain. She has a lot of stiffness in the morning. No left arm pain.   She has known cervical spondylosis and DDD. She has minimal bilateral foraminal stenosis C3-C4 and moderate right C5-C6 foraminal stenosis.   Treatment options discussed with patient and following plan made:   - PT for cervical spine. Orders to [facility] in [place].  - Discussed cervical injections. She is not interested in them currently.  - If no improvement with PT, consider cervical MRI.  - Follow up with me in 6-8 weeks and prn.   I spent a total of *** minutes in face-to-face and non-face-to-face activities related to this patient's care today including review of outside records, review of imaging, review of symptoms, physical exam,  discussion of differential diagnosis, discussion of treatment options, and documentation.   Glade Boys PA-C Dept. of Neurosurgery  "

## 2024-07-17 ENCOUNTER — Inpatient Hospital Stay
Admission: RE | Admit: 2024-07-17 | Discharge: 2024-07-17 | Attending: Orthopedic Surgery | Admitting: Orthopedic Surgery

## 2024-07-17 DIAGNOSIS — M5412 Radiculopathy, cervical region: Secondary | ICD-10-CM

## 2024-07-21 ENCOUNTER — Ambulatory Visit: Admitting: Orthopedic Surgery

## 2024-07-27 ENCOUNTER — Other Ambulatory Visit: Payer: Self-pay | Admitting: Podiatry

## 2024-07-30 ENCOUNTER — Other Ambulatory Visit: Payer: Self-pay

## 2024-07-30 MED ORDER — VALACYCLOVIR HCL 500 MG PO TABS: 500.0000 mg | ORAL_TABLET | Freq: Two times a day (BID) | ORAL | 0 refills | Status: AC

## 2024-09-04 ENCOUNTER — Ambulatory Visit (INDEPENDENT_AMBULATORY_CARE_PROVIDER_SITE_OTHER)

## 2024-09-04 ENCOUNTER — Ambulatory Visit (INDEPENDENT_AMBULATORY_CARE_PROVIDER_SITE_OTHER): Admitting: Podiatry

## 2024-09-04 VITALS — Ht 66.0 in | Wt 175.0 lb

## 2024-09-04 DIAGNOSIS — M7989 Other specified soft tissue disorders: Secondary | ICD-10-CM

## 2024-09-04 DIAGNOSIS — M2012 Hallux valgus (acquired), left foot: Secondary | ICD-10-CM | POA: Diagnosis not present

## 2024-09-04 NOTE — Progress Notes (Signed)
"  °  Subjective:  Patient ID: Bonnie Gordon, female    DOB: 1956-05-22,  MRN: [id number]  Chief Complaint  Patient presents with   Foot Problem    RM 5 Return in about 3 months (around 08/29/2024) for left bunion sx f/u (new xrays), mass on toe f/u. Patient states sharp in left hallux (proximal to growth on left hallux).    DOS: 10/12/2023 Procedure: Bunionectomy left foot  69 y.o. female returns for follow-up from injection.  She returns for follow-up of the mass is still quite painful for her.  Review of Systems: Negative except as noted in the HPI. Denies N/V/F/Ch.   Objective:  There were no vitals filed for this visit. Body mass index is 28.25 kg/m. Constitutional Well developed. Well nourished.  Vascular Foot warm and well perfused. Capillary refill normal to all digits.  Calf is soft and supple, no posterior calf or knee pain, negative Homans' sign  Neurologic Normal speech. Oriented to person, place, and time. Epicritic sensation to light touch grossly present bilaterally.  Dermatologic Incisions are well healed.     Orthopedic: Good range of motion of toe.  No pain at the scar sites from previous injection.  There is a small firm palpable mobile nodular mass in the soft tissues of the dorsal hallux, increased in size since last visit.   Multiple view plain film radiographs: Her correction is maintained she has complete consolidation across the bone callus  Limited musculoskeletal ultrasound examination previously done at last visit of the left hallux revealed a small 5 mm hypoechogenic mass in the subcutaneous tissues that is separate from the joint does not appear to be bony and is separate from the extensor tendon complex Assessment:   1. Mass of soft tissue of foot   2. Hallux valgus of left foot    Plan:  Patient was evaluated and treated and all questions answered.  S/p foot surgery left - Mass is still symptomatic.  At this point I would recommend surgical  excision.  We discussed the risk benefits and potential complications of this.  All questions addressed.  Informed consent signed and reviewed.  Follow-up with me at surgery.  No follow-ups on file.  "

## 2024-09-04 NOTE — Patient Instructions (Signed)

## 2024-09-25 ENCOUNTER — Ambulatory Visit: Admitting: Internal Medicine

## 2025-03-16 ENCOUNTER — Encounter
